# Patient Record
Sex: Male | Born: 1935 | Race: Black or African American | Hispanic: No
Health system: Southern US, Community
[De-identification: ages and names within clinical notes are randomized; demographics above are authoritative.]

## PROBLEM LIST (undated history)

## (undated) DIAGNOSIS — G20A1 Parkinson's disease without dyskinesia, without mention of fluctuations: Secondary | ICD-10-CM

## (undated) DIAGNOSIS — G20 Parkinson's disease: Secondary | ICD-10-CM

---

## 2011-02-20 ENCOUNTER — Encounter: Payer: Self-pay | Admitting: Cardiothoracic Surgery

## 2011-02-20 ENCOUNTER — Encounter: Payer: Self-pay | Admitting: Nurse Practitioner

## 2011-03-07 ENCOUNTER — Encounter: Payer: Self-pay | Admitting: Nurse Practitioner

## 2011-03-07 ENCOUNTER — Encounter: Payer: Self-pay | Admitting: Cardiothoracic Surgery

## 2011-04-06 ENCOUNTER — Encounter: Payer: Self-pay | Admitting: Nurse Practitioner

## 2011-04-06 ENCOUNTER — Encounter: Payer: Self-pay | Admitting: Cardiothoracic Surgery

## 2011-05-07 ENCOUNTER — Encounter: Payer: Self-pay | Admitting: Cardiothoracic Surgery

## 2011-05-07 ENCOUNTER — Encounter: Payer: Self-pay | Admitting: Nurse Practitioner

## 2011-05-27 ENCOUNTER — Ambulatory Visit: Payer: Self-pay | Admitting: Internal Medicine

## 2011-06-06 ENCOUNTER — Encounter: Payer: Self-pay | Admitting: Cardiothoracic Surgery

## 2011-06-06 ENCOUNTER — Encounter: Payer: Self-pay | Admitting: Nurse Practitioner

## 2011-07-07 ENCOUNTER — Encounter: Payer: Self-pay | Admitting: Nurse Practitioner

## 2011-07-07 ENCOUNTER — Encounter: Payer: Self-pay | Admitting: Cardiothoracic Surgery

## 2011-08-07 ENCOUNTER — Encounter: Payer: Self-pay | Admitting: Cardiothoracic Surgery

## 2011-08-07 ENCOUNTER — Encounter: Payer: Self-pay | Admitting: Nurse Practitioner

## 2011-09-04 ENCOUNTER — Encounter: Payer: Self-pay | Admitting: Nurse Practitioner

## 2011-09-04 ENCOUNTER — Encounter: Payer: Self-pay | Admitting: Cardiothoracic Surgery

## 2011-09-04 LAB — WOUND CULTURE

## 2011-09-28 ENCOUNTER — Ambulatory Visit: Payer: Self-pay | Admitting: Neurology

## 2011-10-05 ENCOUNTER — Encounter: Payer: Self-pay | Admitting: Cardiothoracic Surgery

## 2011-10-05 ENCOUNTER — Encounter: Payer: Self-pay | Admitting: Nurse Practitioner

## 2011-11-04 ENCOUNTER — Encounter: Payer: Self-pay | Admitting: Cardiothoracic Surgery

## 2011-11-04 ENCOUNTER — Encounter: Payer: Self-pay | Admitting: Nurse Practitioner

## 2011-12-05 ENCOUNTER — Encounter: Payer: Self-pay | Admitting: Cardiothoracic Surgery

## 2011-12-25 ENCOUNTER — Encounter: Payer: Self-pay | Admitting: Nurse Practitioner

## 2012-01-04 ENCOUNTER — Encounter: Payer: Self-pay | Admitting: Nurse Practitioner

## 2012-01-04 ENCOUNTER — Encounter: Payer: Self-pay | Admitting: Cardiothoracic Surgery

## 2012-02-04 ENCOUNTER — Encounter: Payer: Self-pay | Admitting: Cardiothoracic Surgery

## 2012-02-04 ENCOUNTER — Encounter: Payer: Self-pay | Admitting: Nurse Practitioner

## 2013-03-14 ENCOUNTER — Encounter: Payer: Self-pay | Admitting: Cardiothoracic Surgery

## 2013-04-05 ENCOUNTER — Encounter: Payer: Self-pay | Admitting: Cardiothoracic Surgery

## 2013-04-25 ENCOUNTER — Encounter: Payer: Self-pay | Admitting: Surgery

## 2013-04-30 LAB — WOUND AEROBIC CULTURE

## 2013-05-01 ENCOUNTER — Ambulatory Visit: Payer: Self-pay | Admitting: Ophthalmology

## 2013-05-01 LAB — POTASSIUM: Potassium: 4.1 mmol/L (ref 3.5–5.1)

## 2013-05-06 ENCOUNTER — Encounter: Payer: Self-pay | Admitting: Cardiothoracic Surgery

## 2013-05-06 ENCOUNTER — Encounter: Payer: Self-pay | Admitting: Surgery

## 2013-05-16 ENCOUNTER — Ambulatory Visit: Payer: Self-pay | Admitting: Ophthalmology

## 2013-05-26 ENCOUNTER — Ambulatory Visit: Payer: Self-pay | Admitting: Ophthalmology

## 2013-05-26 LAB — POTASSIUM: Potassium: 4.1 mmol/L (ref 3.5–5.1)

## 2013-06-05 ENCOUNTER — Encounter: Payer: Self-pay | Admitting: Surgery

## 2013-06-05 ENCOUNTER — Encounter: Payer: Self-pay | Admitting: Cardiothoracic Surgery

## 2013-06-06 ENCOUNTER — Ambulatory Visit: Payer: Self-pay | Admitting: Ophthalmology

## 2013-07-06 ENCOUNTER — Encounter: Payer: Self-pay | Admitting: Surgery

## 2013-07-06 ENCOUNTER — Encounter: Payer: Self-pay | Admitting: Cardiothoracic Surgery

## 2013-08-06 ENCOUNTER — Encounter: Payer: Self-pay | Admitting: Surgery

## 2013-08-06 ENCOUNTER — Encounter: Payer: Self-pay | Admitting: Cardiothoracic Surgery

## 2013-08-28 ENCOUNTER — Inpatient Hospital Stay: Payer: Self-pay | Admitting: Internal Medicine

## 2013-08-28 LAB — URINALYSIS, COMPLETE
BACTERIA: NONE SEEN
Bilirubin,UR: NEGATIVE
Blood: NEGATIVE
GLUCOSE, UR: NEGATIVE mg/dL (ref 0–75)
Ketone: NEGATIVE
LEUKOCYTE ESTERASE: NEGATIVE
NITRITE: NEGATIVE
PROTEIN: NEGATIVE
Ph: 6 (ref 4.5–8.0)
SPECIFIC GRAVITY: 1.021 (ref 1.003–1.030)
Squamous Epithelial: NONE SEEN
WBC UR: <1 /HPF (ref 0–5)

## 2013-08-28 LAB — CBC WITH DIFFERENTIAL/PLATELET
BASOS ABS: 0 10*3/uL (ref 0.0–0.1)
Basophil %: 0.2 %
Eosinophil #: 0.2 10*3/uL (ref 0.0–0.7)
Eosinophil %: 4 %
HCT: 43.7 % (ref 40.0–52.0)
HGB: 14.3 g/dL (ref 13.0–18.0)
LYMPHS PCT: 31.7 %
Lymphocyte #: 1.9 10*3/uL (ref 1.0–3.6)
MCH: 27.2 pg (ref 26.0–34.0)
MCHC: 32.7 g/dL (ref 32.0–36.0)
MCV: 83 fL (ref 80–100)
MONOS PCT: 10.5 %
Monocyte #: 0.6 x10 3/mm (ref 0.2–1.0)
NEUTROS PCT: 53.6 %
Neutrophil #: 3.2 10*3/uL (ref 1.4–6.5)
PLATELETS: 108 10*3/uL — AB (ref 150–440)
RBC: 5.25 10*6/uL (ref 4.40–5.90)
RDW: 14 % (ref 11.5–14.5)
WBC: 6.1 10*3/uL (ref 3.8–10.6)

## 2013-08-28 LAB — BASIC METABOLIC PANEL
Anion Gap: 3 — ABNORMAL LOW (ref 7–16)
BUN: 20 mg/dL — ABNORMAL HIGH (ref 7–18)
CREATININE: 0.84 mg/dL (ref 0.60–1.30)
Calcium, Total: 8.6 mg/dL (ref 8.5–10.1)
Chloride: 107 mmol/L (ref 98–107)
Co2: 30 mmol/L (ref 21–32)
EGFR (African American): >60
EGFR (Non-African Amer.): >60
GLUCOSE: 83 mg/dL (ref 65–99)
Osmolality: 281 (ref 275–301)
POTASSIUM: 3.5 mmol/L (ref 3.5–5.1)
SODIUM: 140 mmol/L (ref 136–145)

## 2013-08-28 LAB — TROPONIN I

## 2013-08-29 LAB — CBC WITH DIFFERENTIAL/PLATELET
Basophil #: 0 10*3/uL (ref 0.0–0.1)
Basophil %: 0.5 %
EOS ABS: 0.3 10*3/uL (ref 0.0–0.7)
EOS PCT: 6 %
HCT: 37.2 % — AB (ref 40.0–52.0)
HGB: 12.7 g/dL — AB (ref 13.0–18.0)
Lymphocyte #: 1.8 10*3/uL (ref 1.0–3.6)
Lymphocyte %: 37.6 %
MCH: 28.2 pg (ref 26.0–34.0)
MCHC: 34.2 g/dL (ref 32.0–36.0)
MCV: 83 fL (ref 80–100)
MONO ABS: 0.4 x10 3/mm (ref 0.2–1.0)
MONOS PCT: 8 %
Neutrophil #: 2.4 10*3/uL (ref 1.4–6.5)
Neutrophil %: 47.9 %
Platelet: 112 10*3/uL — ABNORMAL LOW (ref 150–440)
RBC: 4.5 10*6/uL (ref 4.40–5.90)
RDW: 13.8 % (ref 11.5–14.5)
WBC: 4.9 10*3/uL (ref 3.8–10.6)

## 2013-08-29 LAB — BASIC METABOLIC PANEL
Anion Gap: 6 — ABNORMAL LOW (ref 7–16)
BUN: 16 mg/dL (ref 7–18)
Calcium, Total: 8.3 mg/dL — ABNORMAL LOW (ref 8.5–10.1)
Chloride: 108 mmol/L — ABNORMAL HIGH (ref 98–107)
Co2: 29 mmol/L (ref 21–32)
Creatinine: 0.73 mg/dL (ref 0.60–1.30)
EGFR (Non-African Amer.): >60
GLUCOSE: 105 mg/dL — AB (ref 65–99)
OSMOLALITY: 287 (ref 275–301)
Potassium: 3.3 mmol/L — ABNORMAL LOW (ref 3.5–5.1)
Sodium: 143 mmol/L (ref 136–145)

## 2013-08-30 LAB — HEMOGLOBIN: HGB: 13.3 g/dL (ref 13.0–18.0)

## 2013-08-30 LAB — MAGNESIUM: MAGNESIUM: 1.9 mg/dL

## 2013-08-30 LAB — PRO B NATRIURETIC PEPTIDE: B-Type Natriuretic Peptide: 203 pg/mL (ref 0–450)

## 2013-08-30 LAB — POTASSIUM: Potassium: 4 mmol/L (ref 3.5–5.1)

## 2013-09-02 LAB — WOUND AEROBIC CULTURE

## 2013-09-03 LAB — EXPECTORATED SPUTUM ASSESSMENT W GRAM STAIN, RFLX TO RESP C

## 2013-09-14 ENCOUNTER — Ambulatory Visit: Payer: Self-pay | Admitting: Podiatrist

## 2013-11-09 ENCOUNTER — Ambulatory Visit: Payer: Self-pay | Admitting: Podiatrist

## 2014-05-10 ENCOUNTER — Emergency Department: Payer: Self-pay | Admitting: Emergency Medicine

## 2014-05-10 LAB — CBC WITH DIFFERENTIAL/PLATELET
BASOS ABS: 0.1 10*3/uL (ref 0.0–0.1)
Basophil %: 0.7 %
Eosinophil #: 0.2 10*3/uL (ref 0.0–0.7)
Eosinophil %: 1.9 %
HCT: 40.8 % (ref 40.0–52.0)
HGB: 13.4 g/dL (ref 13.0–18.0)
Lymphocyte #: 2.1 10*3/uL (ref 1.0–3.6)
Lymphocyte %: 25.6 %
MCH: 28 pg (ref 26.0–34.0)
MCHC: 32.9 g/dL (ref 32.0–36.0)
MCV: 85 fL (ref 80–100)
Monocyte #: 0.7 x10 3/mm (ref 0.2–1.0)
Monocyte %: 8.2 %
NEUTROS ABS: 5.3 10*3/uL (ref 1.4–6.5)
NEUTROS PCT: 63.6 %
Platelet: 146 10*3/uL — ABNORMAL LOW (ref 150–440)
RBC: 4.79 10*6/uL (ref 4.40–5.90)
RDW: 13.1 % (ref 11.5–14.5)
WBC: 8.3 10*3/uL (ref 3.8–10.6)

## 2014-05-10 LAB — URINALYSIS, COMPLETE
BACTERIA: NONE SEEN
BLOOD: NEGATIVE
Bilirubin,UR: NEGATIVE
GLUCOSE, UR: NEGATIVE mg/dL (ref 0–75)
Leukocyte Esterase: NEGATIVE
NITRITE: NEGATIVE
Ph: 6 (ref 4.5–8.0)
Protein: NEGATIVE
RBC, UR: NONE SEEN /HPF (ref 0–5)
SPECIFIC GRAVITY: 1.023 (ref 1.003–1.030)
Squamous Epithelial: <1
WBC UR: NONE SEEN /HPF (ref 0–5)

## 2014-05-10 LAB — BASIC METABOLIC PANEL
Anion Gap: 9 (ref 7–16)
BUN: 15 mg/dL (ref 7–18)
CALCIUM: 8.8 mg/dL (ref 8.5–10.1)
CO2: 25 mmol/L (ref 21–32)
Chloride: 108 mmol/L — ABNORMAL HIGH (ref 98–107)
Creatinine: 0.74 mg/dL (ref 0.60–1.30)
EGFR (African American): >60
EGFR (Non-African Amer.): >60
Glucose: 84 mg/dL (ref 65–99)
Osmolality: 283 (ref 275–301)
Potassium: 4.1 mmol/L (ref 3.5–5.1)
Sodium: 142 mmol/L (ref 136–145)

## 2014-05-10 LAB — TROPONIN I: Troponin-I: <0.02 ng/mL

## 2014-07-18 ENCOUNTER — Inpatient Hospital Stay: Payer: Self-pay | Admitting: Internal Medicine

## 2014-07-18 LAB — BASIC METABOLIC PANEL
Anion Gap: 7 (ref 7–16)
BUN: 17 mg/dL (ref 7–18)
CALCIUM: 9 mg/dL (ref 8.5–10.1)
Chloride: 104 mmol/L (ref 98–107)
Co2: 28 mmol/L (ref 21–32)
Creatinine: 0.72 mg/dL (ref 0.60–1.30)
Glucose: 83 mg/dL (ref 65–99)
Osmolality: 278 (ref 275–301)
Potassium: 3.7 mmol/L (ref 3.5–5.1)
SODIUM: 139 mmol/L (ref 136–145)

## 2014-07-18 LAB — URINALYSIS, COMPLETE
BILIRUBIN, UR: NEGATIVE
Bacteria: NONE SEEN
Glucose,UR: NEGATIVE mg/dL (ref 0–75)
Ketone: NEGATIVE
LEUKOCYTE ESTERASE: NEGATIVE
NITRITE: NEGATIVE
Ph: 7 (ref 4.5–8.0)
Protein: NEGATIVE
Specific Gravity: 1.008 (ref 1.003–1.030)
Squamous Epithelial: NONE SEEN
WBC UR: 1 /HPF (ref 0–5)

## 2014-07-18 LAB — CBC WITH DIFFERENTIAL/PLATELET
BASOS PCT: 0.6 %
Basophil #: 0.1 10*3/uL (ref 0.0–0.1)
Eosinophil #: 0.4 10*3/uL (ref 0.0–0.7)
Eosinophil %: 3.9 %
HCT: 47.1 % (ref 40.0–52.0)
HGB: 15.1 g/dL (ref 13.0–18.0)
Lymphocyte #: 2.2 10*3/uL (ref 1.0–3.6)
Lymphocyte %: 23.9 %
MCH: 27.4 pg (ref 26.0–34.0)
MCHC: 32.1 g/dL (ref 32.0–36.0)
MCV: 85 fL (ref 80–100)
MONO ABS: 1.1 x10 3/mm — AB (ref 0.2–1.0)
MONOS PCT: 11.9 %
Neutrophil #: 5.6 10*3/uL (ref 1.4–6.5)
Neutrophil %: 59.7 %
Platelet: 147 10*3/uL — ABNORMAL LOW (ref 150–440)
RBC: 5.52 10*6/uL (ref 4.40–5.90)
RDW: 13.7 % (ref 11.5–14.5)
WBC: 9.3 10*3/uL (ref 3.8–10.6)

## 2014-07-18 LAB — LIPID PANEL
CHOLESTEROL: 181 mg/dL (ref 0–200)
HDL Cholesterol: 51 mg/dL (ref 40–60)
LDL CHOLESTEROL, CALC: 101 mg/dL — AB (ref 0–100)
TRIGLYCERIDES: 144 mg/dL (ref 0–200)
VLDL CHOLESTEROL, CALC: 29 mg/dL (ref 5–40)

## 2014-07-18 LAB — CK: CK, TOTAL: 282 U/L (ref 39–308)

## 2014-07-18 LAB — TROPONIN I
TROPONIN-I: 0.02 ng/mL
Troponin-I: <0.02 ng/mL

## 2014-07-18 LAB — CK-MB
CK-MB: 5.7 ng/mL — ABNORMAL HIGH (ref 0.5–3.6)
CK-MB: 4.9 ng/mL — ABNORMAL HIGH (ref 0.5–3.6)

## 2014-07-19 DIAGNOSIS — I517 Cardiomegaly: Secondary | ICD-10-CM

## 2014-07-19 LAB — CBC WITH DIFFERENTIAL/PLATELET
Basophil #: 0 10*3/uL (ref 0.0–0.1)
Basophil %: 0.6 %
EOS PCT: 3.2 %
Eosinophil #: 0.2 10*3/uL (ref 0.0–0.7)
HCT: 39 % — AB (ref 40.0–52.0)
HGB: 12.7 g/dL — ABNORMAL LOW (ref 13.0–18.0)
LYMPHS ABS: 1.3 10*3/uL (ref 1.0–3.6)
Lymphocyte %: 18.3 %
MCH: 27.2 pg (ref 26.0–34.0)
MCHC: 32.6 g/dL (ref 32.0–36.0)
MCV: 84 fL (ref 80–100)
MONO ABS: 0.8 x10 3/mm (ref 0.2–1.0)
Monocyte %: 11.9 %
NEUTROS ABS: 4.7 10*3/uL (ref 1.4–6.5)
Neutrophil %: 66 %
Platelet: 147 10*3/uL — ABNORMAL LOW (ref 150–440)
RBC: 4.66 10*6/uL (ref 4.40–5.90)
RDW: 13.1 % (ref 11.5–14.5)
WBC: 7.1 10*3/uL (ref 3.8–10.6)

## 2014-07-19 LAB — MAGNESIUM: MAGNESIUM: 1.9 mg/dL

## 2014-07-19 LAB — BASIC METABOLIC PANEL
Anion Gap: 8 (ref 7–16)
BUN: 15 mg/dL (ref 7–18)
CHLORIDE: 110 mmol/L — AB (ref 98–107)
Calcium, Total: 8.4 mg/dL — ABNORMAL LOW (ref 8.5–10.1)
Co2: 28 mmol/L (ref 21–32)
Creatinine: 0.95 mg/dL (ref 0.60–1.30)
EGFR (African American): >60
EGFR (Non-African Amer.): >60
Glucose: 86 mg/dL (ref 65–99)
OSMOLALITY: 291 (ref 275–301)
POTASSIUM: 3.4 mmol/L — AB (ref 3.5–5.1)
Sodium: 146 mmol/L — ABNORMAL HIGH (ref 136–145)

## 2014-07-19 LAB — CK-MB: CK-MB: 3.1 ng/mL (ref 0.5–3.6)

## 2014-07-19 LAB — TROPONIN I: Troponin-I: 0.03 ng/mL

## 2014-07-20 LAB — BASIC METABOLIC PANEL
ANION GAP: 6 — AB (ref 7–16)
BUN: 16 mg/dL (ref 7–18)
CO2: 29 mmol/L (ref 21–32)
CREATININE: 0.88 mg/dL (ref 0.60–1.30)
Calcium, Total: 8.5 mg/dL (ref 8.5–10.1)
Chloride: 110 mmol/L — ABNORMAL HIGH (ref 98–107)
EGFR (Non-African Amer.): >60
Glucose: 87 mg/dL (ref 65–99)
OSMOLALITY: 289 (ref 275–301)
Potassium: 3.9 mmol/L (ref 3.5–5.1)
Sodium: 145 mmol/L (ref 136–145)

## 2014-10-26 NOTE — Op Note (Signed)
PATIENT NAME:  [name], [name] MR#:  [id number] DATE OF BIRTH:  01-09-1936  DATE OF PROCEDURE:  06/06/2013  PREOPERATIVE DIAGNOSIS: Visually significant cataract of the right eye.   POSTOPERATIVE DIAGNOSIS: Visually significant cataract of the right eye.   OPERATIVE PROCEDURE: Cataract extraction by phacoemulsification with implant of intraocular lens to the right eye.   SURGEON: Galen Manila, MD.   ANESTHESIA:  1. Managed anesthesia care.  2. Topical tetracaine drops followed by 2% Xylocaine jelly applied in the preoperative holding area.   COMPLICATIONS: None.   TECHNIQUE:  Stop and chop.   DESCRIPTION OF PROCEDURE: The patient was examined and consented in the preoperative holding area where the aforementioned topical anesthesia was applied to the right eye and then brought back to the Operating Room where the right eye was prepped and draped in the usual sterile ophthalmic fashion and a lid speculum was placed. A paracentesis was created with the side port blade and the anterior chamber was filled with viscoelastic. A near clear corneal incision was performed with the steel keratome. A continuous curvilinear capsulorrhexis was performed with a cystotome followed by the capsulorrhexis forceps. Hydrodissection and hydrodelineation were carried out with BSS on a blunt cannula. The lens was removed in a stop and chop technique and the remaining cortical material was removed with the irrigation-aspiration handpiece. The capsular bag was inflated with viscoelastic and the Tecnis ZCB00 19.5-diopter lens, serial number [id number] was placed in the capsular bag without complication. The remaining viscoelastic was removed from the eye with the irrigation-aspiration handpiece. The wounds were hydrated. The anterior chamber was flushed with Miostat and the eye was inflated to physiologic pressure. 0.1 mL of cefuroxime concentration 10 mg/mL was placed in the anterior chamber. The wounds were found to  be water tight. The eye was dressed with Vigamox. The patient was given protective glasses to wear throughout the day and a shield with which to sleep tonight. The patient was also given drops with which to begin a drop regimen today and will follow-up with me in one day.   ____________________________ Jerilee Field. Ilma Achee, MD wlp:gb D: 06/06/2013 21:23:57 ET T: 06/06/2013 21:31:18 ET JOB#: [id number]  cc: Daevon L. Doranne Schmutz, MD, <Dictator> Jerilee Field Dorothee Napierkowski MD ELECTRONICALLY SIGNED 06/08/2013 9:55

## 2014-10-26 NOTE — Op Note (Signed)
PATIENT NAME:  [name], [name] MR#:  [id number] DATE OF BIRTH:  [birth year]  DATE OF PROCEDURE:  05/16/2013  PREOPERATIVE DIAGNOSIS: Visually significant cataract of the left eye.   POSTOPERATIVE DIAGNOSIS: Visually significant cataract of the left eye.   OPERATIVE PROCEDURE: Cataract extraction by phacoemulsification with implant of intraocular lens to left eye.   SURGEON: Galen Manila, MD.   ANESTHESIA:  1. Managed anesthesia care.  2. Topical tetracaine drops followed by 2% Xylocaine jelly applied in the preoperative holding area.   COMPLICATIONS: None.   TECHNIQUE:  Stop and chop.  DESCRIPTION OF PROCEDURE: The patient was examined and consented in the preoperative holding area where the aforementioned topical anesthesia was applied to the left eye and then brought back to the Operating Room where the left eye was prepped and draped in the usual sterile ophthalmic fashion and a lid speculum was placed. A paracentesis was created with the side port blade and the anterior chamber was filled with viscoelastic. A near clear corneal incision was performed with the steel keratome. A continuous curvilinear capsulorrhexis was performed with a cystotome followed by the capsulorrhexis forceps. Hydrodissection and hydrodelineation were carried out with BSS on a blunt cannula. The lens was removed in a stop and chop technique and the remaining cortical material was removed with the irrigation-aspiration handpiece. The capsular bag was inflated with viscoelastic and the Tecnis ZCB00 20.0-diopter lens, serial number [id number] was placed in the capsular bag without complication. The remaining viscoelastic was removed from the eye with the irrigation-aspiration handpiece. The wounds were hydrated. The anterior chamber was flushed with Miostat and the eye was inflated to physiologic pressure. 0.1 mL of cefuroxime concentration 10 mg/mL was placed in the anterior chamber. The wounds were found to be water  tight. The eye was dressed with Vigamox. The patient was given protective glasses to wear throughout the day and a shield with which to sleep tonight. The patient was also given drops with which to begin a drop regimen today and will follow-up with me in one day.    ____________________________ Jerilee Field. Ion Gonnella, MD wlp:dmm D: 05/16/2013 16:55:59 ET T: 05/16/2013 19:51:44 ET JOB#: [id number]  cc: Gurkirat L. Krysti Hickling, MD, <Dictator> Jerilee Field Brooke Steinhilber MD ELECTRONICALLY SIGNED 05/17/2013 16:33

## 2014-10-27 NOTE — Consult Note (Signed)
Referring Physician:  Monica Becton :   Primary Care Physician:  Halina Maidens : [facility], [address] [place], [place], [place] [address], [phone]  Reason for Consult: Admit Date: 28-Aug-2013  Chief Complaint: Frequent falls  Reason for Consult: Parkinsons   History of Present Illness: History of Present Illness:    [name] is a 79 year old male with history of Parkinson's disease. He is brought to the Emergency Department by his children for frequent falls. For the last 2 weeks, the patient has been having multiple falls. The falls are especially upon standing up; however, lately, the patient has been falling down even with walking small distances. Feels like his feet are stuck to floor. No loss of consciousness. Denies having any chest pain. The patient also states that has been coughing yellowish sputum. Concerning this, the patient is brought to the Emergency Department. Workup in the Emergency Department with a chest x-ray shows borderline cardiomegaly, poor inspiration, mild right basilar atelectasis. CT head showed white matter hypodensities. Otherwise, no other obvious signs of infections are found. The patient received Rocephin and Zithromax in the Emergency Department. Denies having any fever.   Pt has tried stopping his medications with worsening of his symptoms. PD discussion: Rt. hand rest tremor started about 2008, occasionally has left hand tremor. also when he tries to write. mother had Parkinson's disease.  always been soft spoken. slower than he once was, feels stiff, balance is worse.[facility] (ordered 03/2013)09/2011: White Matter microvascular ischemic changes, prominent in occipital horn- MiraLax- progressively impaired, urinary incontinence standing bild FDI atrophy.  Past Medical History HypertensionGoutStasis ulcersVenous insufficiency  Past Surgical HistoryLaser surgery for varicose veins. History for Paget's Disease in father. Significant  for Parkinson's Disease and Alzheimer's in mother.  Social History at a Art gallery manager. Divorced. Denies history of smoking or alcohol use. No history of DUI and denies history of recreational drug use.   He has close male friend. His own kids live in [place] but he has a daughter who lives with him. No known drug allergies.   ROS:  General weakness   HEENT no complaints   Lungs cough   Cardiac no complaints   GI no complaints   GU burning with urination   Musculoskeletal no complaints   Extremities swelling   Skin pruritis   Neuro tremors   Endocrine no complaints   Psych no complaints   Home Medications: Medication Instructions Last Modified Date/Time  carbidopa-levodopa 50 mg-200 mg oral tablet, extended release 1.5 tab(s) orally 3 times a day for 2 weeks, then 2 tablets 3 times a day 23-Feb-15 20:10   Allergies:  No Known Allergies:   Vital Signs: **Vital Signs.:   25-Feb-15 14:07  Vital Signs Type Routine  Temperature Temperature (F) 97.4  Celsius 36.3  Temperature Source oral  Pulse Pulse 75  Respirations Respirations 18  Systolic BP Systolic BP 678  Diastolic BP (mmHg) Diastolic BP (mmHg) 68  Mean BP 93  Pulse Ox % Pulse Ox % 94  Pulse Ox Activity Level  At rest  Oxygen Delivery Room Air/ 21 %   EXAM: General Exam Patient looks appropriate of age, centrally obese, bald, nourished and appropriately groomed, eating dinner while I interviewed. Cardiovascular Exam: S1, S2 heart sounds present Carotid exam revealed no bruit Lung exam was clear to auscultation belly soft, protruberant  Neurological Exam      Mental Status:      Alert,     Oriented to time, place, person and  situation     Attention span and concentration seemed reduced     Memory seemed impaired (couldn't tell me daughter's name for a while)     Intact naming, repetition, comprehension.       Followed 2 step commands - no dysarthria     Fund of knowledge seemed reduced        Cranial Nerves:      Olfactory and vagus nerves not are examined      Visual fields were full      Pupils were equal, round and reactive to light and accommodation      Extra-ocular movements are sccadic      Facial sensations are normal      Face is symmetric, jaw tremors      Finger rub was heard symmetric in both ears      Palate and uvular movements are normal and oral sensations are OK      Neck muscle strength and shoulder shrug is normal      Tongue protrusion and uvular elevation are normal       Motor Exam:      Tone cogwheeling in both UE, tremors in both hand right >> left UE,       Muscle strength in all extremities is 5/5.       Deep Tendon Reflexes:      symmetric 1 +      Right Toes are down going,  Left Toes are down going            Sensory Exam:      Sensations were reduced to light touch in all extremities            Co-ordination:      Finger to nose is abnormal            Gait: significant difficulty sitting up in bed, ataxia, apraxia.  Lab Results:  Routine Micro:  24-Feb-15 22:16   Micro Text Report WOUND AEROBIC CULTURE   GRAM STAIN                FEW WHITE BLOOD CELLS   GRAM STAIN                NO ORGANISMS SEEN   ANTIBIOTIC                       Gram Stain 1 FEW WHITE BLOOD CELLS  Gram Stain 2 NO ORGANISMS SEEN  Result(s) reported on 30 Aug 2013 at 12:32PM.  Routine Chem:  24-Feb-15 04:59   Glucose, Serum  105  BUN 16  Creatinine (comp) 0.73  Sodium, Serum 143  Chloride, Serum  108  CO2, Serum 29  Calcium (Total), Serum  8.3  Anion Gap  6  Osmolality (calc) 287  eGFR (African American) >60  eGFR (Non-African American) >60 (eGFR values <37m/min/1.73 m2 may be an indication of chronic kidney disease (CKD). Calculated eGFR is useful in patients with stable renal function. The eGFR calculation will not be reliable in acutely ill patients when serum creatinine is changing rapidly. It is not useful in  patients on dialysis. The eGFR  calculation may not be applicable to patients at the low and high extremes of body sizes, pregnant women, and vegetarians.)  25-Feb-15 05:41   Potassium, Serum 4.0 (Result(s) reported on 30 Aug 2013 at 06:37AM.)  Magnesium, Serum 1.9 (1.8-2.4 THERAPEUTIC RANGE: 4-7 mg/dL TOXIC: > 10 mg/dL  -----------------------)  Cardiac:  23-Feb-15 14:39  Troponin I < 0.02 (0.00-0.05 0.05 ng/mL or less: NEGATIVE  Repeat testing in 3-6 hrs  if clinically indicated. >0.05 ng/mL: POTENTIAL  MYOCARDIAL INJURY. Repeat  testing in 3-6 hrs if  clinically indicated. NOTE: An increase or decrease  of 30% or more on serial  testing suggests a  clinically important change)  Routine UA:  23-Feb-15 18:36   Color (UA) Yellow  Clarity (UA) Clear  Glucose (UA) Negative  Bilirubin (UA) Negative  Ketones (UA) Negative  Specific Gravity (UA) 1.021  Blood (UA) Negative  pH (UA) 6.0  Protein (UA) Negative  Nitrite (UA) Negative  Leukocyte Esterase (UA) Negative (Result(s) reported on 28 Aug 2013 at 07:16PM.)  RBC (UA) 2 /HPF  WBC (UA) <1 /HPF  Bacteria (UA) NONE SEEN  Epithelial Cells (UA) NONE SEEN  Mucous (UA) PRESENT (Result(s) reported on 28 Aug 2013 at 07:16PM.)  Routine Hem:  24-Feb-15 04:59   WBC (CBC) 4.9  RBC (CBC) 4.50  Hematocrit (CBC)  37.2  Platelet Count (CBC)  112  MCV 83  MCH 28.2  MCHC 34.2  RDW 13.8  Neutrophil % 47.9  Lymphocyte % 37.6  Monocyte % 8.0  Eosinophil % 6.0  Basophil % 0.5  Neutrophil # 2.4  Lymphocyte # 1.8  Monocyte # 0.4  Eosinophil # 0.3  Basophil # 0.0 (Result(s) reported on 29 Aug 2013 at 05:28AM.)  25-Feb-15 05:41   Hemoglobin (CBC) 13.3 (Result(s) reported on 30 Aug 2013 at 06:30AM.)   Radiology Impression: Radiology Impression: 1) extensive WM microvascular ischemic changes (less likely to be ventriculomegaly related increased periventricular pressure related WM hypodensity)  2) Cortical atrophy with compensatory ventriculomegaly.    Impression/Recommendations: Recommendations:   1) progressive parkinson's disease - pt not interested in increasing dose of carbidopa/levodopa for now. (he actually wanted to cut it down) - had prolonged discussion with pt. and his kids. Per kids when he cut medicine - his symptoms got much worse. over time he has developed significant freezing of gait and wosening of his shuffling. (? some features of magnetic gait ? NPH with his ventriculomegaly but cognition is not as impaired, urinary incontinence came later in the picture - won't subject him to LP to look for high volume tap related improved gait for now) Gait impairment (sudden worsening in last 2 wks or so) - with extensive WM changes and usual then elevated BP - ? infract  causing lower half parkinsonism (vascular parkinsonism)ordered MRI brainordered ASA 81 mg po dayhe has long standing uncontrolled HTN due to medication non compliance mild toxic metabolic encephalopathy - ? PNA,  Venous stasis related LE ulcers. aggressive PT/OT/ST. Pt can be made familiar with [facility]/LOUD if possible (he had declined it in past as out pt due to time restrains)might benefit from rehab placement on discharge.will follow.  Electronic Signatures: Ray Church (MD)  (Signed 25-Feb-15 22:20)  Authored: REFERRING PHYSICIAN, Primary Care Physician, Consult, History of Present Illness, Review of Systems, PAST MEDICAL/SURGICAL HISTORY, HOME MEDICATIONS, ALLERGIES, NURSING VITAL SIGNS, Physical Exam-, LAB RESULTS, RADIOLOGY RESULTS, Recommendations   Last Updated: 25-Feb-15 22:20 by Ray Church (MD)

## 2014-10-27 NOTE — Discharge Summary (Signed)
PATIENT NAME:  [name], [name] MR#:  [id number] DATE OF BIRTH:  11-Sep-1935  DATE OF ADMISSION:  08/28/2013 DATE OF DISCHARGE:  09/01/2013   ADMITTING DIAGNOSIS: Right lower lobe pneumonia, frequent falls due to debility.   DISCHARGE DIAGNOSES: 1. Malignant hypertension.  2. Suspected bacterial pneumonia in the right lower lobe.  3. Right leg venous stasis ulcers with Proteus mirabilis colonization.  4. Anemia.  5. Parkinson's disease.  6. Constipation.  7. Generalized weakness.   DISCHARGE CONDITION: Stable.   DISCHARGE MEDICATIONS:  1. The patient is to continue carbidopa/levodopa new dose 50/200 two tablets 3 times daily 1 hour before meals.  2. Acetaminophen 325 mg 2 tablets every 4 hours as needed.  3. Lisinopril 40 mg twice daily. 4. Amlodipine 10 mg p.o. day.  5. Senna 1 tablet once daily as needed.  6. Docusate sodium 100 mg p.o. twice daily.  7. Levofloxacin 750 mg p.o. once daily for the next 5 days.  8. Albuterol/ipratropium 2.5/0.5 mg in 3 mL inhalation solution 1 inhalation 6 times daily as needed.  9. Aspirin 81 mg p.o. daily.   HOME OXYGEN: None.   DIET: 2 gram salt, low-fat, low-cholesterol, regular consistency.   ACTIVITY LIMITATIONS: As tolerated.   REFERRALS: To physical therapy, occupational therapy.    FOLLOWUP APPOINTMENT: With Dr. Judithann Graves in 2 days after discharge.    CONSULTANTS: Case management, social work, Dr. Sherryll Burger in neurology, wound care, Ms. Maple Hudson.   RADIOLOGIC STUDIES:  Chest x-ray, portable, single view on 08/28/2013 revealed borderline cardiomegaly, poor inspiration, mild right basilar atelectasis. No segmental infiltrate.  CT scan of head without contrast on 08/28/2013 showed no acute intracranial findings, extensive white matter hypodensities, likely represent small vessel ischemia, marked atrophy and proportional ventricular dilatation.  MRI of brain without contrast on 08/31/2013 showed generalized atrophy with diffuse white  matter hyperintensity, most consistent with chronic microvascular ischemia. No acute infarct.   HOSPITAL COURSE: The patient is a 79 year old African-American male with history of Parkinson's disease, who presented to the hospital with complaints of frequent falls. Please refer to Dr. Clarita Leber admission note on 08/28/2013. On arrival to the hospital, the patient denied any loss of consciousness. No chest pains; however, he was expectorating yellow sputum. His chest x-ray was concerning for possible atelectasis versus pneumonia. His vital signs: Temperature was 97.9, pulse was 64, respiration rate was 16, blood pressure was 219/104, saturation was 96% on 2 liters of oxygen through nasal cannula. Physical exam was unremarkable. The patient did have right lower extremity ulcerations around the medial malleolus area. On chest auscultation, he was found to have somewhat decreased breath sounds in lower lobes. His lab data done on admission showed normal BMP except for mild elevation of BUN to 20, otherwise BMP was normal. Troponin was less than 0.02. CBC was within normal limits; however, the patient's platelet count was low at 108. Absolute neutrophil count was normal at 3.2. Urinalysis was unremarkable. EKG showed atrial flutter at 65 beats per minute, 5:1 AV conduction, nonspecific ST-T changes. The patient's chest x-ray, as mentioned above, was concerning for possible pneumonia. The patient was admitted to the hospital for further evaluation.   He was started on antibiotic therapy, and consultation with neurologist, Dr. Sherryll Burger, was obtained. Dr. Sherryll Burger saw the patient in consultation, and he adjusted the patient's Parkinson's medications. Recommended to get an MRI of brain done to rule out stroke. He also recommended to control blood pressure closely, also continue aspirin therapy at 81 mg p.o. daily dose  due to microvascular ischemic changes seen on CT scan as well as MRI. He felt that the patient's gait  impairment with extensive white matter changes and unusually elevated blood pressure could be due to vascular parkinsonism as well as Parkinson's disease itself. He recommended to continue aggressive physical therapy, occupational therapy as well as speech therapy and to get rehabilitation placement. The patient had MRI done, which did not show any acute stroke; however, extensive microvascular changes.  While in the hospital, the patient was also noted to have significantly elevated blood pressure. His blood pressure medications were advanced to current levels. His blood pressure is much better controlled today, on 09/01/2013. On the day of discharge, 09/01/2013, the patient's temperature is 97.8, pulse is fluctuating between 57 to 67, respiration rate was 18, blood pressure 110/65, saturation 96% on room air at rest.   In regards to pneumonia, the patient is to continue antibiotic therapy for 5 more days to complete course. We chose this antibiotic since the patient did have leg ulcerations, venous stasis with Proteus mirabilis which was felt to be colonization; however, still antibiotic such as Levaquin would work for eradicating this colonization.   In regards to anemia, the patient was noted to be slightly anemic during rehydration. Hemoglobin level drifted down to 12.7 on 08/29/2013; however, when we stopped the patient's IV fluids, the patient's hemoglobin level normalized to 13.3.   In regards to generalized weakness, he was evaluated by physical therapist, and rehabilitation placement was recommended.   In regards to right leg venous stasis ulcerations, wound care, as mentioned above, saw the patient and recommended specialized dressing which was: Every day, the patient's right leg is supposed to be cleaned with soap and water, pat gently to dry, apply Cadexomer iodine gel and gauze to wound bed and then top with dry 4 x 4 and calcium alginate for absorption, wrap with zinc oxide/calamine layer  and secure with Coban. Change 3 times weekly, Mondays, Wednesdays, Fridays, and follow up with [facility] as outpatient.   In regards to speech evaluation, the patient is to continue low-salt diet. The patient's medications should be given whole in puree if any trouble swallowing with liquids. The patient's tray has to be set up at meals. The patient must sit fully upright for any oral intake.   The patient is being discharged in stable condition with the above-mentioned medications and followup.   TIME SPENT: 40 minutes.   ____________________________ Katharina Caper, MD rv:lb D: 09/01/2013 12:46:14 ET T: 09/01/2013 13:33:24 ET JOB#: [id number]  cc: Katharina Caper, MD, <Dictator> Bari Edward, MD Breshae Belcher MD ELECTRONICALLY SIGNED 09/07/2013 18:53

## 2014-10-27 NOTE — H&P (Signed)
PATIENT NAME:  [name], [name] MR#:  [id number] DATE OF BIRTH:  Oct 30, 1935  DATE OF ADMISSION:  08/28/2013  PRIMARY CARE PHYSICIAN: Dr. Bari Edward.   REFERRING PHYSICIAN: Dr. Janalyn Harder.   CHIEF COMPLAINT: Frequent falls.   HISTORY OF PRESENT ILLNESS: [name] is a 79 year old male with history of Parkinson's disease. He is brought to the Emergency Department by his children for frequent falls. For the last 2 weeks, the patient has been having multiple falls. The falls are especially upon standing up; however, lately, the patient has been falling down even with walking small distances. No loss of consciousness. Denies having any chest pain. The patient also states that has been coughing yellowish sputum. Concerning this, the patient is brought to the Emergency Department. Workup in the Emergency Department with a chest x-ray shows borderline cardiomegaly, poor inspiration, mild right basilar atelectasis. CT head showed white matter hypodensities. Otherwise, no other obvious signs of infections are found. The patient received Rocephin and Zithromax in the Emergency Department. Denies having any fever.   PAST MEDICAL HISTORY: Parkinson's disease.   PAST SURGICAL HISTORY: None.   ALLERGIES: No known drug allergies.   HOME MEDICATIONS: Carbidopa/levodopa 50/200 mg 1-1/2 tablets 3 times a day.   SOCIAL HISTORY: Quit smoking in 1990. Denies drinking alcohol or using illicit drugs. Currently lives with his daughter.   FAMILY HISTORY: Hypertension.   REVIEW OF SYSTEMS:  CONSTITUTIONAL: Generalized weakness.  EYES: No change in vision.  ENT: No change in hearing.  RESPIRATORY: Has cough, shortness of breath.  CARDIOVASCULAR: No chest pain, palpitations.  GASTROINTESTINAL: No nausea, vomiting, abdominal pain. Usually constipated.  GENITOURINARY: Has urinary incontinence.  ENDOCRINE: No polyuria or polydipsia.  HEMATOLOGIC: No easy bruising or bleeding.  SKIN: No rash or lesions.   MUSCULOSKELETAL: No joint pains .  NEUROLOGIC: The patient has Parkinson's disease.   PHYSICAL EXAMINATION:  GENERAL: This is a well-built, well-nourished, age-appropriate male lying down in the bed, not in distress.  VITAL SIGNS: Temperature 97.9, pulse 64, blood pressure 219/104, respiratory rate of 16, oxygen saturation is 96% on 2 liters of oxygen.  HEENT: Head normocephalic, atraumatic. There is no scleral icterus. Conjunctivae normal. Pupils equal and react to light. Extraocular movements are intact. Mucous membranes dry. No pharyngeal erythema.  NECK: Supple. No lymphadenopathy. No JVD. No carotid bruit. No thyromegaly.  CHEST: Has no focal tenderness. Somewhat decreased breath sounds in the lower lobes.  HEART: S1 and S2 regular. No murmurs are heard. No pedal edema both legs. Right lower extremity has an ulcer which has been healing under the care of the wound care center.  ABDOMEN: Obese. Bowel sounds present. Soft, nontender, nondistended.  SKIN: As mentioned above, has an ulcer on the right heel.  MUSCULOSKELETAL: Good range of motion in all of the extremities.  NEUROLOGIC: The patient is alert, oriented to place, person and time. Cranial nerves II through XII intact. Motor 5/5 in upper and lower extremities.   LABORATORIES: Chest x-ray as mentioned above, right lower lobe atelectasis.   CT head without contrast: Chronic microvascular changes.   UA negative for nitrites and leukocyte esterase. CBC and CMP are completely within normal limits.   ASSESSMENT AND PLAN: [name] is a 79 year old male who comes to the Emergency Department with cough, shortness of breath and frequent falls.  1. Right lower lobe pneumonia: Will treat it as a community-acquired pneumonia with Rocephin and Zithromax.  2. Frequent falls secondary to debility: Will involve physical therapy, occupational therapy. Will obtain  orthostatic . The patient does not have any loss of consciousness.  3. Parkinson's  disease: Continue with carbidopa/levodopa.  4. Keep the patient on deep vein thrombosis prophylaxis with Lovenox.   TIME SPENT: 45 minutes.   ____________________________ Susa Griffins, MD pv:gb D: 08/28/2013 21:31:17 ET T: 08/28/2013 21:45:23 ET JOB#: [id number]  cc: Susa Griffins, MD, <Dictator> Bari Edward, MD Susa Griffins MD ELECTRONICALLY SIGNED 09/10/2013 22:37

## 2014-11-04 NOTE — Consult Note (Signed)
CHIEF COMPLAINT and HISTORY:  Subjective/Chief Complaint weakness, falls   History of Present Illness Patient is a 79 yo AAM with Parkinsons disease who is wheelchair bound and has significant tremors is admitted with falls and confusion.  He has been living at home, but this is becoming more difficult.  He had right sided weakness that was transient, prompting evaluation. He is near his baseline now.  He is a fair historian but not great.  He had an MRI which shows no acute stroke.  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   PAST MEDICAL/SURGICAL HISTORY:  Past Medical History:   overactive bladder:    Parkinson's Dis.:   ALLERGIES:  Allergies:  No Known Allergies:   HOME MEDICATIONS:  Home Medications: Medication Instructions Status  carbidopa-levodopa 50 mg-200 mg oral tablet, extended release 1 tab(s) orally 3 times a day Active  furosemide 20 mg oral tablet 1 tab(s) orally once a day, As Needed Active  Ditropan XL 10 mg/24 hr oral tablet, extended release 1 tab(s) orally once a day Active   Family and Social History:  Family History Coronary Artery Disease  Hypertension  Parkinsons   Social History negative tobacco, negative ETOH, negative Illicit drugs   Place of Living Home   Review of Systems:  Subjective/Chief Complaint Neuro symptoms per HPI No heat or cold intolerance Overactive bladder No blurry or double vision No tinnitus or ear pain No rashes or ulcer No suicidal ideation or psychosis No signs of bleeding or easy bruising No SOB/DOE, orthopnea, or sputum No palpitations or chest pain No N/V/D or abdominal pain No joint pain or joint swelling No fever or chills No unintentional weight loss or gain   Medications/Allergies Reviewed Medications/Allergies reviewed   Physical Exam:  GEN well developed, well nourished, obese   HEENT pink conjunctivae, moist oral mucosa   NECK No masses   trachea midline   RESP normal resp effort  no use of accessory muscles   CARD regular rate  no JVD   VASCULAR ACCESS none   ABD denies tenderness  soft  normal BS   GU no superpubic tenderness   LYMPH negative neck, negative axillae   EXTR negative cyanosis/clubbing, negative edema   SKIN normal to palpation, skin turgor good   NEURO cranial nerves intact, follows commands, tremor at a baseline, gait not assessed as is in bed   PSYCH alert, A+O to time, place, person, fair insight   LABS:  Laboratory Results: LabObservation:    14-Jan-16 08:49, Echo Doppler  OBSERVATION   Reason for Test  Cardiology:  Echo Doppler   REASON FOR EXAM:      COMMENTS:       PROCEDURE: [facility] - ECHO DOPPLER COMPLETE(TRANSTHOR)  - Jul 19 2014  8:49AM     RESULT: Echocardiogram Report    Patient Name:   [name] Date of Exam: 07/19/2014  Medical Rec #:  [id number]           Custom1:  Date of Birth:  1935-12-04        Height:       73.0 in  Patient Age:    8 years         Weight:       254.0 lb  Patient Gender: M                BSA:  2.29 m??    Indications: TIA  Sonographer:    Sherrie Sport RDCS  Referring Phys: Harvest Dark, A    Sonographer Comments: Technically difficult study due to poor echo   windows and The only view obtainable was apical.    Summary:   1. Left ventricular ejection fraction, by visual estimation, is 55 to   60%.   2. Normal global left ventricular systolic function.   3. Impaired relaxation pattern of LV diastolic filling.   4. Mild concentric left ventricular hypertrophy.   5. Mild LVOT gradient.   6. Mildly dilated left atrium.   7. No cardiac source of embolism is identified.  2D AND M-MODE MEASUREMENTS (normal ranges within parentheses):  Left Ventricle:          Normal  IVSd (2D):      1.10 cm (0.7-1.1)  LVPWd (2D):     1.27 cm (0.7-1.1) Aorta/LA:                  Normal  LVIDd (2D):     4.04 cm (3.4-5.7) Aortic Root (2D): 3.00  cm(2.4-3.7)  LVIDs (2D):     2.62 cm           Left Atrium (2D): 3.20 cm (1.9-4.0)  LV FS (2D):     35.1 %   (>25%)  LV EF (2D):     65.0 %   (>50%)                                    Right Ventricle:                                    RVd (2D):  LV DIASTOLIC FUNCTION:  MV Peak E: 0.59 m/s E/e' Ratio: 7.60  MV Peak A: 0.92 m/s Decel Time: 274 msec  E/A Ratio: 0.64  SPECTRAL DOPPLER ANALYSIS (where applicable):  Mitral Valve:  MV P1/2 Time: 79.46 msec  MV Area, PHT: 2.77 cm??  Aortic Valve: AoV Max Vel: 1.69 m/s AoV Peak PG: 11.5 mmHg AoV Mean PG:  LVOT Vmax: 1.50 m/s LVOT VTI:  LVOT Diameter: 2.00 cm  AoV Area, Vmax: 2.79 cm?? AoV Area, VTI:  AoV Area, Vmn:  Tricuspid Valve and PA/RV Systolic Pressure: TR Max Velocity: 2.22 m/s RA   Pressure: 5 mmHg RVSP/PASP: 24.6 mmHg    PHYSICIAN INTERPRETATION:  Left Ventricle: The left ventricular internal cavity size was normal.   Mild concentric left ventricular hypertrophy. Global LV systolic function   was normal. Left ventricular ejection fraction, by visual estimation, is   55 to 60%. Spectral Doppler shows impaired relaxation pattern of LV   diastolic filling.  Right Ventricle: Normal right ventricular size, wall thickness, and     systolic function.  Left Atrium: The left atrium is mildly dilated.  Right Atrium: The right atrium is normal in size and structure.  Pericardium: There is no evidence of pericardial effusion.  Mitral Valve: The mitral valve is normal in structure. No evidence of   mitral valve stenosis. Trace mitral valve regurgitation is seen.  Tricuspid Valve: The tricuspid valve is normal. Trivial tricuspid   regurgitation is visualized. The tricuspid regurgitant velocity is 2.22   m/s, and with an assumed right atrial pressure of 5 mmHg, the estimated   right ventricular systolic pressure is normal at 24.6 mmHg.  Aortic Valve: The aortic valve is trileaflet and structurally normal,   with normal leaflet excursion;  without any evidence of aortic stenosis or   insufficiency.  Pulmonic Valve: The pulmonic valve is not well seen.  Aorta: The aorta is not well seen.  Venous: The inferior vena cava was not well visualized.    [id number] Kathlyn Sacramento MD  Electronically signed by [id number] Kathlyn Sacramento MD  Signature Date/Time: 07/19/2014/11:05:39 AM    *** Final ***    IMPRESSION: .        Verified By: Mertie Clause. Fletcher Anon, M.D., MD  Routine Chem:    13-Jan-16 07:61, Basic Metabolic Panel (w/Total Calcium)  Glucose, Serum 83  BUN 17  Creatinine (comp) 0.72  Sodium, Serum 139  Potassium, Serum 3.7  Chloride, Serum 104  CO2, Serum 28  Calcium (Total), Serum 9.0  Anion Gap 7  Osmolality (calc) 278  eGFR (African American) >60  eGFR (Non-African American) >60  eGFR values <53m/min/1.73 m2 may be an indication of chronic  kidney disease (CKD).  Calculated eGFR, using the MRDR Study equation, is useful in   patients with stable renal function.  The eGFR calculation will not be reliable in acutely ill patients  when serum creatinine is changing rapidly. It is not useful in  patients on dialysis. The eGFR calculation may not be applicable  to patients at the low and high extremes of body sizes, pregnant  women, and vegetarians.    13-Jan-16 15:04, Lipid Profile ([place]  Cholesterol, Serum 181  Triglycerides, Serum 144  HDL (INHOUSE) 51  VLDL Cholesterol Calculated 29  LDL Cholesterol Calculated 101  Result(s) reported on 18 Jul 2014 at 0[facility]    14-Jan-16 051:83 Basic Metabolic Panel (w/Total Calcium)  Glucose, Serum 86  BUN 15  Creatinine (comp) 0.95  Sodium, Serum 146  Potassium, Serum 3.4  Chloride, Serum 110  CO2, Serum 28  Calcium (Total), Serum 8.4  Anion Gap 8  Osmolality (calc) 291  eGFR (African American) >60  eGFR (Non-African American) >60  eGFR values <640mmin/1.73 m2 may be an indication of chronic  kidney disease (CKD).  Calculated eGFR, using the MRDR Study equation, is  useful in   patients with stable renal function.  The eGFR calculation will not be reliable in acutely ill patients  when serum creatinine is changing rapidly. It is not useful in  patients on dialysis. The eGFR calculation may not be applicable  to patients at the low and high extremes of body sizes, pregnant  women, and vegetarians.    14-Jan-16 04:29, Magnesium, Serum  Magnesium, Serum 1.9  1.8-2.4  THERAPEUTIC RANGE: 4-7 mg/dL  TOXIC: > 10 mg/dL   -----------------------  Cardiac:    13-Jan-16 11:41, CK, Total  CK, Total 282  Result(s) reported [facility] 18 Jul 2014 at [facility]   13-Jan-16 15:04, CPK-MB, Serum  CPK-MB, Serum 5.7  Result(s) reported on 18 Jul 2014 at 06:54PM.    13-Jan-16 15:04, Troponin I  Troponin I < 0.02  0.00-0.05  0.05 ng/mL or less: NEGATIVE   Repeat testing in 3-6 hrs   if clinically indicated.  >0.05 ng/mL: POTENTIAL   MYOCARDIAL INJURY. Repeat   testing in 3-6 hrs if   clinically indicated.  NOTE: An increase or decrease   of 30% or more on serial   testing suggests a   clinically important change    13-Jan-16 20:43, CPK-MB, Serum  CPK-MB, Serum 4.9  Result(s) reported on 18 Jul 2014 at 09:20PM.  13-Jan-16 20:43, Troponin I  Troponin I 0.02  0.00-0.05  0.05 ng/mL or less: NEGATIVE   Repeat testing in 3-6 hrs   if clinically indicated.  >0.05 ng/mL: POTENTIAL   MYOCARDIAL INJURY. Repeat   testing in 3-6 hrs if   clinically indicated.  NOTE: An increase or decrease   of 30% or more on serial   testing suggests a   clinically important change    13-Jan-16 23:45, CPK-MB, Serum  CPK-MB, Serum 3.1  Result(s) reported on 19 Jul 2014 at 01:19AM.    13-Jan-16 23:45, Troponin I  Troponin I 0.03  0.00-0.05  0.05 ng/mL or less: NEGATIVE   Repeat testing in 3-6 hrs   if clinically indicated.  >0.05 ng/mL: POTENTIAL   MYOCARDIAL INJURY. Repeat   testing in 3-6 hrs if   clinically indicated.  NOTE: An increase or decrease   of 30% or more on  serial   testing suggests a   clinically important change  Routine UA:    13-Jan-16 16:32, Urinalysis  Color (UA) Straw  Clarity (UA) Clear  Glucose (UA) Negative  Bilirubin (UA) Negative  Ketones (UA) Negative  Specific Gravity (UA) 1.008  Blood (UA) 1+  pH (UA) 7.0  Protein (UA) Negative  Nitrite (UA) Negative  Leukocyte Esterase (UA) Negative  Result(s) reported on 18 Jul 2014 at 05:11PM.  RBC (UA) 3 /HPF  WBC (UA) 1 /HPF  Bacteria (UA)   NONE SEEN  Epithelial Cells (UA)   NONE SEEN   Result(s) reported on 18 Jul 2014 at 05:11PM.  Routine Hem:    13-Jan-16 15:04, CBC Profile  WBC (CBC) 9.3  RBC (CBC) 5.52  Hemoglobin (CBC) 15.1  Hematocrit (CBC) 47.1  Platelet Count (CBC) 147  MCV 85  MCH 27.4  MCHC 32.1  RDW 13.7  Neutrophil % 59.7  Lymphocyte % 23.9  Monocyte % 11.9  Eosinophil % 3.9  Basophil % 0.6  Neutrophil # 5.6  Lymphocyte # 2.2  Monocyte # 1.1  Eosinophil # 0.4  Basophil # 0.1  Result(s) reported on 18 Jul 2014 at 03:54PM.    14-Jan-16 04:29, CBC Profile  WBC (CBC) 7.1  RBC (CBC) 4.66  Hemoglobin (CBC) 12.7  Hematocrit (CBC) 39.0  Platelet Count (CBC) 147  MCV 84  MCH 27.2  MCHC 32.6  RDW 13.1  Neutrophil % 66.0  Lymphocyte % 18.3  Monocyte % 11.9  Eosinophil % 3.2  Basophil % 0.6  Neutrophil # 4.7  Lymphocyte # 1.3  Monocyte # 0.8  Eosinophil # 0.2  Basophil # 0.0  Result(s) reported on 19 Jul 2014 at 05:20AM.   RADIOLOGY:  Radiology Results: XRay:    23-Feb-15 19:19, Chest Portable Single View  Chest Portable Single View  REASON FOR EXAM:    weakness, falls  COMMENTS:       PROCEDURE: DXR - DXR PORTABLE CHEST SINGLE VIEW  - Aug 28 2013  7:19PM     CLINICAL DATA:  Weakness, fall    EXAM:  PORTABLE CHEST - 1 VIEW    COMPARISON:  None.    FINDINGS:  Cardiomediastinal silhouette scattered borderline cardiomegaly.  Study is limited by poor inspiration. Mild right basilar  atelectasis. No segmental infiltrate or pulmonary  edema.     IMPRESSION:  Borderline cardiomegaly. Poor inspiration. Mild right basilar  atelectasis.No segmental infiltrate.      Electronically Signed    By: Lahoma Crocker M.D.    On: 08/28/2013 19:29  Verified By: Ephraim Hamburger, M.D.,    [phone] 19:06, Chest Portable Single View  Chest Portable Single View  REASON FOR EXAM:    weakness  COMMENTS:       PROCEDURE: DXR - DXR PORTABLE CHEST SINGLE VIEW  - May 10 2014  7:06PM     CLINICAL DATA:  Weakness and not acting right.    EXAM:  PORTABLE CHEST - 1 VIEW    COMPARISON:  08/28/2013    FINDINGS:  Single view of the chest demonstrates clear lungs. Heart and  mediastinum are within normal limits. Negative for a pneumothorax.   IMPRESSION:  No acute chest abnormality.      Electronically Signed    By: Markus Daft M.D.    On: 05/10/2014 19:14         Verified By: Burman Riis, M.D.,  [place]:    14-Jan-16 10:57, US Carotid Doppler Bilateral  US Carotid Doppler Bilateral  REASON FOR EXAM:    TIA  COMMENTS:       PROCEDURE: [place]  - US CAROTID DOPPLER BILATERAL  - Jul 19 2014 10:57AM     CLINICAL DATA:  Transient ischemic attack.    EXAM:  BILATERAL CAROTID DUPLEX ULTRASOUND    TECHNIQUE:  Pearline Cables scale imaging, color Doppler and duplex ultrasound were  performed of bilateral carotid and vertebral arteries in the neck.    COMPARISON:  None.  FINDINGS:  Criteria: Quantification of carotid stenosis is based on velocity  parameters that correlate the residual internal carotid diameter  with NASCET-based stenosis levels, using the diameter of the distal  internal carotid lumen as the denominator for stenosis measurement.    The following velocity measurements were obtained:    RIGHT    ICA:  Occluded.    CCA:  809/98 cm/sec    SYSTOLIC ICA/CCA RATIO:  ICA occluded.  DIASTOLIC ICA/CCA RATIO:  ICA occluded.    ECA:  77 cm/sec    LEFT    ICA:  125/19 cm/sec    CCA:  338/25 cm/sec    SYSTOLIC ICA/CCA  RATIO:  0.9    DIASTOLIC ICA/CCA RATIO:  1.3    ECA:  159 cm/sec  RIGHTCAROTID ARTERY: Complete occlusion of the proximal and more  distal right internal carotid artery is noted.    RIGHT VERTEBRAL ARTERY:  Antegrade flow is noted.    LEFT CAROTID ARTERY:  No significant plaque or stenosis is noted.    LEFT VERTEBRAL ARTERY:  Antegrade flow is noted.     IMPRESSION:  Complete occlusion of the right internal carotid artery is noted.    No hemodynamically significant stenosis or plaque is noted in the  left cervical carotid arteries.  Electronically Signed    By: Sabino Dick M.D.    On: 07/19/2014 11:58         Verified By: Marveen Reeks, M.D.,  [place]:    23-Feb-15 19:19, Chest Portable Single View  PACS Image    23-Feb-15 20:08, CT Head Without Contrast  PACS Image    26-Feb-15 09:05, MRI Brain Without Contrast  PACS Image    05-Nov-15 19:06, Chest Portable Single View  PACS Image    13-Jan-16 14:49, CT Head Without Contrast  PACS Image    14-Jan-16 10:14, MRI Brain Without Contrast  PACS Image    14-Jan-16 10:57, US Carotid Doppler Bilateral  PACS Image  MRI:    26-Feb-15 09:05, MRI Brain Without Contrast  MRI Brain Without Contrast  REASON  FOR EXAM:    gait impairment, ataxia, extensive WM changes on CT   head, concern for stroke.  COMMENTS:       PROCEDURE: MR  - MR BRAIN WO CONTRAST  - Aug 31 2013  9:05AM     CLINICAL DATA:  Gait impairment. Ataxia. Parkinson's. Rule out  stroke.    EXAM:  MRI HEAD WITHOUT CONTRAST    TECHNIQUE:  Multiplanar, multiecho pulse sequences of the brain and surrounding  structures were obtained without intravenous contrast.  COMPARISON:  CT 08/28/2013    FINDINGS:  Negative for acute infarct.    Generalized atrophy. Chronic ischemic changes throughout the white  matter. No cortical infarct. Slight hyperintensity in the pons  likely due to chronic ischemia.    Negative for hemorrhage or mass.    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 Mild mucosal edema in the paranasal sinuses.     IMPRESSION:  Generalized atrophy with diffuse white matter hyperintensity most  consistent with chronic microvascular ischemia. No acute infarct.      Electronically Signed    By: Franchot Gallo M.D.    On: 08/31/2013 09:17         Verified By: Truett Perna, M.D.,    14-Jan-16 10:14, MRI Brain Without Contrast  MRI Brain Without Contrast  REASON FOR EXAM:    TIA, right sided weakness  COMMENTS:       PROCEDURE: MR  - MR BRAIN WO CONTRAST  - Jul 19 2014 10:14AM     CLINICAL DATA:  79 year old male with right side weakness. TIA.  History of bilateral carotid surgery. Initial encounter.    EXAM:  MRI HEAD WITHOUT CONTRAST    TECHNIQUE:  Multiplanar, multiecho pulse sequences of the brain and surrounding  structures were obtained without intravenous contrast.  COMPARISON:  Head CT without contrast 07/18/2014. Brain MRI  08/31/2013 andearlier.    FINDINGS:  Cerebral volume is stable since 2015. Major intracranial vascular  flow voids are stable. No restricted diffusion to suggest acute  infarction. No midline shift, mass effect, evidence of mass lesion,  ventriculomegaly, extra-axial collection or acute intracranial  hemorrhage. Cervicomedullary junction and pituitary are within  normal limits. Stable visualized cervical spine. Normal bone marrow  signal.    Confluent cerebral white matter T2 and FLAIR hyperintensity persists  and is nonspecific. Deep gray matter nuclei remain within normal  limits. Minimal to mild T2 heterogeneity in the brainstem.  Suggestion of a tiny chronic lacunar infarct in the right cerebellum  which is new from prior studies (series 9, image 8).    Visible internal auditory structures appear normal. Stable paranasal  sinuses and mastoids. Stable orbits soft tissues. 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  Visualized scalp  soft tissues are within normal limits.     IMPRESSION:  1.  No acute intracranial abnormality.  2. Chronic nonspecific signal abnormality in the cerebral white  matter, favor small vessel disease. Subtle chronic lacunar infarct  in the right cerebellar hemisphere is new since 2015.    Electronically Signed    By: Lars Pinks M.D.    On: 07/19/2014 10:44         Verified By: Gwenyth Bender. HALL, M.D.,  CT:    23-Feb-15 20:08, CT Head Without Contrast  CT Head Without Contrast  REASON FOR EXAM:    increased weakness and falls x 3-4 days (multiple   falls)  COMMENTS:  PROCEDURE: CT  - CT HEAD WITHOUT CONTRAST  - Aug 28 2013  8:08PM     CLINICAL DATA:  Increase weakness, falls    EXAM:  CT HEAD WITHOUT CONTRAST    TECHNIQUE:  Contiguous axial images were obtained from the base of the skull  through the vertex without intravenous contrast.  COMPARISON:  MR HEAD W/O CM dated 09/28/2011    FINDINGS:  No acute intracranial hemorrhage. No focal mass lesion. No CT  evidenceof acute infarction. No midline shift or mass effect. No  hydrocephalus. Basilar cisterns are patent.    There is generalized cortical atrophy and proportional ventricular  dilatation. Extensive white matter hypodensities which extend into  the subcortical regions    Paranasal sinuses and mastoid air cells are clear.     IMPRESSION:  1. No acute intracranial findings.  2. Extensive white matter hypodensities likely represent small  vessel ischemia.  3. Marked atrophy and proportional ventricular dilatation.      Electronically Signed    By: Suzy Bouchard M.D.    On: 08/28/2013 20:14         Verified By: Rennis Golden, M.D.,    13-Jan-16 14:49, CT Head Without Contrast  CT Head Without Contrast  REASON FOR EXAM:    weakness, worse on right side  COMMENTS:       PROCEDURE: CT  - CT HEAD WITHOUT CONTRAST  - Jul 18 2014  2:49PM     CLINICAL DATA:  Weakness beginning at 4 a.m.  today. Fall. Initial  encounter.    EXAM:  CT HEAD WITHOUT CONTRAST    TECHNIQUE:  Contiguous axial images were obtained from the base of the skull  through the vertex without intravenous contrast.  COMPARISON:  Head CT scan 08/28/2013.    FINDINGS:  Atrophy and extensive chronic microvascular ischemic change are  again seen. There is no evidence of acute intracranial abnormality  including infarction, hemorrhage, mass lesion, mass effect, midline  shift or abnormal extra-axial fluid collection. No hydrocephalus or  pneumocephalus. The calvarium is intact.     IMPRESSION:  No acute abnormality.    Atrophy and chronic microvascular ischemic change.    Electronically Signed    By: Inge Rise M.D.    On: 07/18/2014 15:17         Verified By: Ramond Dial, M.D.,   ASSESSMENT AND PLAN:  Assessment/Admission Diagnosis weakness and falls, right sided weakness.  Possible TIA.  No new stroke on MRI Parkinsons Right carotid occlusion, no significant left sided stenosis.   Plan He had an MRI which shows no acute stroke.  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 This is likely a chronic right ICA occlusion and would not explain any new symptoms.  No intervention or surgery would be helpful with complete occlusion.  Will plan follow up visit in office to monitor carotid disease ongoing.  No other recs at this time except ASA therapy daily.   level 4 consult   Electronic Signatures: Algernon Huxley (MD)  (Signed 14-Jan-16 17:35)  Authored: Chief Complaint and History, PAST MEDICAL/SURGICAL HISTORY, ALLERGIES, HOME MEDICATIONS, Family and Social History, Review of Systems, Physical Exam, LABS, RADIOLOGY, Assessment and Plan   Last Updated: 14-Jan-16 17:35 by Algernon Huxley (MD)

## 2014-11-04 NOTE — Discharge Summary (Signed)
PATIENT NAME:  [name], [name] MR#:  [id number] DATE OF BIRTH:  02/13/1936  DATE OF ADMISSION:  07/18/2014 DATE OF DISCHARGE:  07/20/2014  DISCHARGE DIAGNOSIS: Parkinson disease with worsening tremor, dose of Sinemet has increased also started on Requip, responding well.   SECONDARY DIAGNOSES:   1. Parkinson disease.  2. Overactive bladder.  3.   Chronic ulcer on his right leg.    CONSULTATIONS: Physical therapy and occupational therapy along with neurology, Dr. Mellody Drown.   PROCEDURES AND RADIOLOGY:  1.  CT scan of the head without contrast on January 13 showed no acute abnormality.  2.  MRI of the brain without contrast on January 14 showed no acute intracranial abnormality. Subtle chronic lacunar infarct in the right cerebral hemisphere, which is new since 2015.  3.  Bilateral carotid Dopplers on January 14 showed complete occlusion of the right internal carotid artery. No hemodynamically significant stenosis in the left cervical carotid arteries.  4.  A 2-D echocardiogram on January 14 showed LVEF of 55%-60%. Impaired relaxation of LV diastolic filling.   MAJOR LABORATORY PANEL: Urinalysis on admission was negative.   HISTORY AND SHORT HOSPITAL COURSE: The patient is a 79 year old male with above-mentioned medical problems who was admitted for right-sided weakness and fall. Initially there was a concern for possible TIA. Please see Dr. Dr. Prudencio Pair dictated history and physical for further details. Neurology consultation was obtained with Dr. Mellody Drown who recommended complete neurological workup, including MRI of the brain, carotid Dopplers, and 2-D echocardiogram, which were all essentially negative. This was thought to be more of Parkinson disease progression. Dose of Sinemet was increased. The patient was also started on Requip by neurology and the patient was responding well. The patient was evaluated by physical and occupational therapy who recommended rehabilitation.  Vascular surgery consultation was obtained with Dr. Festus Barren considering complete occlusion of the right internal carotid artery. No further recommendation will be made other than outpatient follow up with them. The patient was doing fair did have some improvement with increasing dose of carbidopa/levodopa along with starting of Requip and he was discharged to rehabilitation facility in stable condition.   PERTINENT PHYSICAL EXAMINATION ON THE DATE OF DISCHARGE:  VITAL SIGNS: On the date of discharge, his vital signs are as follows: Temperature 98.2, heart rate 67 per minute, respirations 17 per minute, blood pressure 114/66. He was saturating 97% on room air.  CARDIOVASCULAR: S1, S2 normal. No murmurs, rubs, gallops.  LUNGS: Clear to auscultation bilaterally. No wheezing, rales, rhonchi, or crepitation.  ABDOMEN: Soft and benign.  NEUROLOGIC: He had 4/5 strength in the right extremities and 5/5 in the left side. He did have some increased tone in the right side versus left along with some resting tremor. He also had severe bradykinesia. Sensations were intact. All other physical examination remained at baseline.   DISCHARGE MEDICATIONS:   Medication Instructions  furosemide 20 mg oral tablet  1 tab(s) orally once a day, As Needed   ditropan xl 10 mg/24 hr oral tablet, extended release  1 tab(s) orally once a day   carbidopa-levodopa 50 mg-200 mg oral tablet, extended release  1 tab(s) orally every 6 hours   ropinirole 0.25 mg oral tablet  1 tab(s) orally 3 times a day     DISCHARGE DIET: Low fat, low cholesterol.   DISCHARGE ACTIVITY: As tolerated.   DISCHARGE INSTRUCTIONS AND FOLLOWUP: The patient was instructed to follow up with his primary care physician, Dr. Bari Edward, in 1-2 weeks.  He will need followup with [facility] neurology in 2-4 weeks. He will also need followup with vascular surgery, Dr. Festus Barren, in 4-6 weeks. He will get evaluation by physical therapy and management  while at the facility.   TOTAL TIME DISCHARGING THIS PATIENT: 55 minutes.    ____________________________ Ellamae Sia. Sherryll Burger, MD vss:bm D: 07/20/2014 23:28:09 ET T: 07/21/2014 00:15:44 ET JOB#: [id number]  cc: Analeise Mccleery S. Sherryll Burger, MD, <Dictator> Bari Edward, MD Annice Needy, MD Troy Sine Katrinka Blazing, MD [facility] Neurology  Ellamae Sia [facility] MD ELECTRONICALLY SIGNED 07/26/2014 10:12

## 2014-11-04 NOTE — H&P (Signed)
PATIENT NAME:  [name], [name] MR#:  [id number] DATE OF BIRTH:  [birth year]  DATE OF ADMISSION:  07/18/2014  ADMITTING PHYSICIAN: Harrington Challenger, MD   PRIMARY CARE PHYSICIAN: Bari Edward, MD   CHIEF COMPLAINT: Right-sided weakness and fall.   HISTORY OF PRESENT ILLNESS: Mr. [name] is a 79 year old African American male with past medical history significant for Parkinson disease, overactive bladder, comes to the hospital secondary to weakness and fell from his wheelchair twice this morning. The patient is a very poor historian. His daughter is at bedside, who lives with him and gives most of the history. According to the daughter, the patient has had severe Parkinson disease for a while. He has significant tremors. He is wheelchair-bound at baseline. This morning, he was in his wheelchair, he felt weak and fell to the floor. She just thought it was maybe he could not position himself right in wheelchair, so he was back in wheelchair and after a while he felt some right-sided weakness and fell onto the floor again. So, she brought him to the hospital. She also noticed couple of days ago that he had blank stare and it took a while for her to get him back. She states the patient's mother also had Parkinson and had multiple TIAs at the end and felt it was possibly a TIA. The patient did not have any fevers, chills or other symptoms at this time. He is awake, alert, oriented, no slurred speech. Passed a swallow evaluation at bedside and is able to eat a sandwich without any trouble.   PAST MEDICAL HISTORY:  1. Parkinson disease.  2. Overactive bladder.  3. Chronic ulcer on his right leg.   PAST SURGICAL HISTORY: None.   ALLERGIES: No known drug allergies.   CURRENT HOME MEDICATIONS: Not available. He takes a Parkinson medicine, Lasix and a medicine for his overactive bladder. The pharmacist is verifying at this time.   SOCIAL HISTORY: Lives at home with his daughter, wheelchair-bound at  baseline, and also has a walker. No smoking or alcohol use.   FAMILY HISTORY: Significant for Parkinson disease, TIAs in mom.   REVIEW OF SYSTEMS: CONSTITUTIONAL: No fever, fatigue, or weakness.  EYES: Positive for blurred vision, no inflammation or glaucoma.  ENT: No tinnitus, ear pain, hearing loss, epistaxis or discharge. No dysphagia.  RESPIRATORY: No cough, wheeze, hemoptysis, or COPD.  CARDIOVASCULAR: No chest pain, orthopnea, edema, arrhythmia, palpitations, or syncope.  GASTROINTESTINAL: No nausea, vomiting, diarrhea, abdominal pain, hematemesis, or melena.  GENITOURINARY: No dysuria or hematuria, increased urination present.  ENDOCRINE: No polyuria, nocturia, thyroid problems, heat or cold intolerance.  HEMATOLOGY: No anemia, easy bruising or bleeding.  SKIN: No acne, rash or lesions.  MUSCULOSKELETAL: No arthritis or gout.  NEUROLOGIC: Positive for possible TIA, right-sided weakness. No seizures. No vertigo.  PSYCHOLOGICAL: No anxiety, insomnia, depression.   PHYSICAL EXAMINATION:  VITAL SIGNS: Temperature 97.8 degrees Fahrenheit, pulse 60, respirations 18, blood pressure 228/92, pulse ox 100% on room air.  GENERAL: Well-built, well-nourished male significant tremors in appearance, not in any acute distress.  HEENT: Normocephalic, atraumatic. Pupils equal, round, reacting to light. Anicteric sclerae. Extraocular movements intact. Oropharynx clear without erythema, mass, or exudates.  NECK: Supple. No thyromegaly, JVD or carotid bruits. No lymphadenopathy.  LUNGS: Moving air bilaterally. No wheeze or crackles. No use of accessory muscles for breathing.  CARDIOVASCULAR: S1, S2, regular rate and rhythm. No murmurs, rubs, or gallops.  ABDOMEN: Soft, nontender, nondistended. No hepatosplenomegaly. Normal bowel sounds.  EXTREMITIES: Has 2+ pedal  edema, right lower extremity is wrapped up secondary to chronic but, looks like follows with the wound care. Unable to palpate dorsalis pedis  pulses. He has significant resting and also intentional tremors of his both upper extremities on exam.  NEUROLOGIC: Cranial nerves seem to be intact. No focal motor or sensory deficits. Right-sided weakness, right arm strength is 4/5, and compared to the left arm, seems like limited upper arm strength which is chronic, seems like going on for a couple of years. Strength is same, has significant tremors. Decreased strength in both lower extremities, again chronic. Sensation is intact.  PSYCHOLOGIC: The patient is awake, alert, oriented x3.   LABORATORY DATA: WBC 9.3, hemoglobin 15.1, hematocrit 47.1, platelet count 147,000. Urinalysis negative for any infection.   Sodium 139, potassium 3.7, chloride 104, bicarbonate 28, BUN 17, creatinine 0.72, glucose 83, calcium 9.0. Troponin is negative.   CT of the head without contrast showing no acute abnormality, atrophy and chronic small vessel ischemic changes.   ASSESSMENT AND PLAN: A 79 year old male with Parkinson, overactive bladder, admitted for weakness and blank stare, possible transient ischemic attack.  1. Possible transient ischemic attack. Admit to telemetry, could be just generalized weakness, worsening of Parkinson disease, but because of his family history and risk factors, will admit to tele, neuro checks, MRI of the brain and carotid Dopplers and echo have been ordered. Start aspirin. Check lipid profile at this time.  2. Parkinson disease with worsening tremor. Did not have an outpatient followup recently. Continue his Sinemet and will get neurology to see if they can adjust his Parkinson medications.  3. Overactive bladder. Continue his home medications.  4. Deep vein thrombosis prophylaxis with Lovenox.   CODE STATUS: Full code.   TIME SPENT ON ADMISSION: 50 minutes.     ____________________________ Enid Baas, MD rk:kl D: 07/18/2014 17:20:55 ET T: 07/18/2014 18:00:27 ET JOB#: [id number]  cc: Enid Baas, MD,  <Dictator> Bari Edward, MD Enid Baas MD ELECTRONICALLY SIGNED 07/23/2014 16:16

## 2014-11-04 NOTE — Consult Note (Signed)
Referring Physician:  Gladstone Lighter :   Primary Care Physician:  [facility] [place], [facility], [address] [place], [place] [address], [phone]  Reason for Consult: Admit Date: 18-Jul-2014  Chief Complaint: R sided weakness  Reason for Consult: parkinson   History of Present Illness: History of Present Illness:   79 yo RHD M with hx of Parkinsons for at least 10 years presents to [facility] secondary to R sided weakness.  Pt sees Dr. Manuella Ghazi as outpatient but has not seen him for at least 2-3 years.  He continues to have tremors and decreased movement on the R.  Pt states that he slid out of his chair today which caused him to come here today.  He states that he has a baseline of walking with a walker.  He does not report numbness, headache or vision changes.  ROS:  General denies complaints   HEENT no complaints   Lungs no complaints   Cardiac no complaints   GI no complaints   GU no complaints   Musculoskeletal no complaints   Extremities no complaints   Skin no complaints   Neuro tremor   Endocrine no complaints   Psych no complaints   Past Medical/Surgical Hx:  overactive bladder:   Parkinson's Dis.:   Past Medical/ Surgical Hx:  Past Medical History Parkinsons, bladder   Past Surgical History none   Home Medications: Medication Instructions Last Modified Date/Time  carbidopa-levodopa 50 mg-200 mg oral tablet, extended release 1 tab(s) orally 3 times a day 13-Jan-16 17:24  furosemide 20 mg oral tablet 1 tab(s) orally once a day, As Needed 13-Jan-16 17:24  Ditropan XL 10 mg/24 hr oral tablet, extended release 1 tab(s) orally once a day 13-Jan-16 17:24   Allergies:  No Known Allergies:   Allergies:  Allergies NKDA   Social/Family History: Employment Status: retired  Lives With: alone  Living Arrangements: apartment  Social History: no tob, no EtOH, no illicits  Family History: no seizures, no  stroke   Vital Signs: **Vital Signs.:   14-Jan-16 06:32  Vital Signs Type Routine  Temperature Temperature (F) 98.9  Celsius 37.1  Temperature Source oral  Pulse Pulse 68  Respirations Respirations 18  Systolic BP Systolic BP 449  Diastolic BP (mmHg) Diastolic BP (mmHg) 66  Mean BP 80  Pulse Ox % Pulse Ox % 96   Physical Exam: General: overweight, NAD  HEENT: normocephalic, sclera nonicteric, oropharynx clear  Neck: supple, no JVD, no bruits  Chest: CTA B, no wheezing, good movement  Cardiac: RRR, no murmurs, no edema, 2+ pulses  Extremities: no C/C/E, FROM   Neurologic Exam: Mental Status: alert and oriented x 3, normal speech and language, follows complex commands, low voice  Cranial Nerves: PERRLA, EOMI, nl VF, face symmetric with hypomimia, tongue midline, shoulder shrug equal  Motor Exam: 4/5 R, 5 /5 L, increased tone R>L, R resting tremor, severe bradykinesia  Deep Tendon Reflexes: 1+/4 B, downgoing plantars B  Sensory Exam: pinprick, temperature, and vibration intact B  Coordination: FTN and HTS WNL on L   Lab Results: LabObservation:  14-Jan-16 08:49   OBSERVATION Reason for Test  Routine Chem:  13-Jan-16 15:04   Cholesterol, Serum 181  Triglycerides, Serum 144  HDL (INHOUSE) 51  VLDL Cholesterol Calculated 29  LDL Cholesterol Calculated  101 (Result(s) reported on 18 Jul 2014 at 07:21PM.)  14-Jan-16 04:29   Glucose, Serum 86  BUN 15  Creatinine (comp) 0.95  Sodium,  Serum  146  Potassium, Serum  3.4  Chloride, Serum  110  CO2, Serum 28  Calcium (Total), Serum  8.4  Anion Gap 8  Osmolality (calc) 291  eGFR (African American) >60  eGFR (Non-African American) >60 (eGFR values <10m/min/1.73 m2 may be an indication of chronic kidney disease (CKD). Calculated eGFR, using the MRDR Study equation, is useful in  patients with stable renal function. The eGFR calculation will not be reliable in acutely ill patients when serum creatinine is changing rapidly.  It is not useful in patients on dialysis. The eGFR calculation may not be applicable to patients at the low and high extremes of body sizes, pregnant women, and vegetarians.)  Cardiac:  13-Jan-16 11:41   CK, Total 282 (Result(s) reported on 18 Jul 2014 at [facility])    23:45   Troponin I 0.03 (0.00-0.05 0.05 ng/mL or less: NEGATIVE  Repeat testing in 3-6 hrs  if clinically indicated. >0.05 ng/mL: POTENTIAL  MYOCARDIAL INJURY. Repeat  testing in 3-6 hrs if  clinically indicated. NOTE: An increase or decrease  of 30% or more on serial  testing suggests a  clinically important change)  CPK-MB, Serum 3.1 (Result(s) reported on 19 Jul 2014 at 01:19AM.)  Routine UA:  13-Jan-16 16:32   Color (UA) Straw  Clarity (UA) Clear  Glucose (UA) Negative  Bilirubin (UA) Negative  Ketones (UA) Negative  Specific Gravity (UA) 1.008  Blood (UA) 1+  pH (UA) 7.0  Protein (UA) Negative  Nitrite (UA) Negative  Leukocyte Esterase (UA) Negative (Result(s) reported on 18 Jul 2014 at 05:11PM.)  RBC (UA) 3 /HPF  WBC (UA) 1 /HPF  Bacteria (UA) NONE SEEN  Epithelial Cells (UA) NONE SEEN  Result(s) reported on 18 Jul 2014 at 05:11PM.  Routine Hem:  14-Jan-16 04:29   WBC (CBC) 7.1  RBC (CBC) 4.66  Hemoglobin (CBC)  12.7  Hematocrit (CBC)  39.0  Platelet Count (CBC)  147  MCV 84  MCH 27.2  MCHC 32.6  RDW 13.1  Neutrophil % 66.0  Lymphocyte % 18.3  Monocyte % 11.9  Eosinophil % 3.2  Basophil % 0.6  Neutrophil # 4.7  Lymphocyte # 1.3  Monocyte # 0.8  Eosinophil # 0.2  Basophil # 0.0 (Result(s) reported on 19 Jul 2014 at 05:20AM.)   Radiology Results: [place]    14-Jan-16 10:57, [place] Doppler Bilateral  [place] Doppler Bilateral   REASON FOR EXAM:    TIA  COMMENTS:       PROCEDURE: [place] - [place]orea[place]OTID DOPPLER BILATERAL  - Jul 19 2014 10:57AM     CLINICAL DATA:  Transient ischemic attack.    EXAM:  BILATERAL CAROTID DUPLEX ULTRASOUND    TECHNIQUE:  GPearline Cablesscale imaging, color  Doppler and duplex ultrasound were  performed of bilateral carotid and vertebral arteries in the neck.    COMPARISON:  None.  FINDINGS:  Criteria: Quantification of carotid stenosis is based on velocity  parameters that correlate the residual internal carotid diameter  with NASCET-based stenosis levels, using the diameter of the distal  internal carotid lumen as the denominator for stenosis measurement.    The following velocity measurements were obtained:    RIGHT    ICA:  Occluded.    CCA:  1785/88cm/sec    SYSTOLIC ICA/CCA RATIO:  ICA occluded.  DIASTOLIC ICA/CCA RATIO:  ICA occluded.    ECA:  77 cm/sec    LEFT    ICA:  125/19 cm/sec    CCA:  135/14  cm/sec    SYSTOLIC ICA/CCA RATIO:  0.9    DIASTOLIC ICA/CCA RATIO:  1.3    ECA:  159 cm/sec  RIGHTCAROTID ARTERY: Complete occlusion of the proximal and more  distal right internal carotid artery is noted.    RIGHT VERTEBRAL ARTERY:  Antegrade flow is noted.    LEFT CAROTID ARTERY:  No significant plaque or stenosis is noted.    LEFT VERTEBRAL ARTERY:  Antegrade flow is noted.     IMPRESSION:  Complete occlusion of the right internal carotid artery is noted.    No hemodynamically significant stenosis or plaque is noted in the  left cervical carotid arteries.  Electronically Signed    By: Sabino Dick M.D.    On: 07/19/2014 11:58         Verified By: Marveen Reeks, M.D.,  MRI:    14-Jan-16 10:14, MRI Brain Without Contrast  MRI Brain Without Contrast   REASON FOR EXAM:    TIA, right sided weakness  COMMENTS:       PROCEDURE: MR  - MR BRAIN WO CONTRAST  - Jul 19 2014 10:14AM     CLINICAL DATA:  79 year old male with right side weakness. TIA.  History of bilateral carotid surgery. Initial encounter.    EXAM:  MRI HEAD WITHOUT CONTRAST    TECHNIQUE:  Multiplanar, multiecho pulse sequences of the brain and surrounding  structures were obtained without intravenous contrast.  COMPARISON:  Head CT  without contrast 07/18/2014. Brain MRI  08/31/2013 andearlier.    FINDINGS:  Cerebral volume is stable since 2015. Major intracranial vascular  flow voids are stable. No restricted diffusion to suggest acute  infarction. No midline shift, mass effect, evidence of mass lesion,  ventriculomegaly, extra-axial collection or acute intracranial  hemorrhage. Cervicomedullary junction and pituitary are within  normal limits. Stable visualized cervical spine. Normal bone marrow  signal.    Confluent cerebral white matter T2 and FLAIR hyperintensity persists  and is nonspecific. Deep gray matter nuclei remain within normal  limits. Minimal to mild T2 heterogeneity in the brainstem.  Suggestion of a tiny chronic lacunar infarct in the right cerebellum  which is new from prior studies (series 9, image 8).    Visible internal auditory structures appear normal. Stable paranasal  sinuses and mastoids. Stable orbits soft tissues. 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 Visualized scalp  soft tissues are within normal limits.     IMPRESSION:  1.  No acute intracranial abnormality.  2. Chronic nonspecific signal abnormality in the cerebral white  matter, favor small vessel disease. Subtle chronic lacunar infarct  in the right cerebellar hemisphere is new since 2015.    Electronically Signed    By: Lars Pinks M.D.    On: 07/19/2014 10:44         Verified By: Gwenyth Bender. HALL, M.D.,  CT:    13-Jan-16 14:49, CT Head Without Contrast  CT Head Without Contrast   REASON FOR EXAM:    weakness, worse on right side  COMMENTS:       PROCEDURE: CT  - CT HEAD WITHOUT CONTRAST  - Jul 18 2014  2:49PM     CLINICAL DATA:  Weakness beginning at 4 a.m. today. Fall. Initial  encounter.    EXAM:  CT HEAD WITHOUT CONTRAST    TECHNIQUE:  Contiguous axial images were obtained from the base of the skull  through the vertex without intravenous contrast.  COMPARISON:  Head CT scan  08/28/2013.  FINDINGS:  Atrophy and extensive chronic microvascular ischemic change are  again seen. There is no evidence of acute intracranial abnormality  including infarction, hemorrhage, mass lesion, mass effect, midline  shift or abnormal extra-axial fluid collection. No hydrocephalus or  pneumocephalus. The calvarium is intact.     IMPRESSION:  No acute abnormality.    Atrophy and chronic microvascular ischemic change.    Electronically Signed    By: Inge Rise M.D.    On: 07/18/2014 15:17         Verified By: Ramond Dial, M.D.,   Radiology Impression: Radiology Impression: MRI of brain personally reviewed by me and is normal   Impression/Recommendations: Recommendations:   prior notes reviewed by me reviewed by me    Parkinson's-  this appears to be idiopathic and is not controlled;  pt has not followed with his Neurologist lately, no dyskinesia described but mild freezing,  tremor is uncontrolled but often the hardest to cure increase Sinemet to 50/200 QID add requip 0.85m TID rehab ok  will follow but pt can be discharged at anytime  Electronic Signatures: SJamison Neighbor(MD)  (Signed 14-Jan-16 14:47)  Authored: REFERRING PHYSICIAN, Primary Care Physician, Consult, History of Present Illness, Review of Systems, PAST MEDICAL/SURGICAL HISTORY, HOME MEDICATIONS, ALLERGIES, Social/Family History, NURSING VITAL SIGNS, Physical Exam-, LAB RESULTS, RADIOLOGY RESULTS, Recommendations   Last Updated: 14-Jan-16 14:47 by SJamison Neighbor(MD)

## 2015-01-15 ENCOUNTER — Encounter: Payer: Medicare Other | Attending: Surgery | Admitting: Surgery

## 2015-01-15 DIAGNOSIS — M1990 Unspecified osteoarthritis, unspecified site: Secondary | ICD-10-CM | POA: Insufficient documentation

## 2015-01-15 DIAGNOSIS — Z87891 Personal history of nicotine dependence: Secondary | ICD-10-CM | POA: Insufficient documentation

## 2015-01-15 DIAGNOSIS — I10 Essential (primary) hypertension: Secondary | ICD-10-CM | POA: Diagnosis not present

## 2015-01-15 DIAGNOSIS — I87311 Chronic venous hypertension (idiopathic) with ulcer of right lower extremity: Secondary | ICD-10-CM | POA: Diagnosis not present

## 2015-01-15 DIAGNOSIS — L97313 Non-pressure chronic ulcer of right ankle with necrosis of muscle: Secondary | ICD-10-CM | POA: Insufficient documentation

## 2015-01-15 DIAGNOSIS — G20 Parkinson's disease: Secondary | ICD-10-CM | POA: Diagnosis not present

## 2015-01-15 DIAGNOSIS — R600 Localized edema: Secondary | ICD-10-CM | POA: Insufficient documentation

## 2015-01-15 NOTE — Progress Notes (Signed)
[name], [name] ([id number]) Visit Report for 01/15/2015 Abuse/Suicide Risk Screen Details Patient Name: [name], [name] Date of Service: 01/15/2015 10:15 AM Medical Record Number: [id number] Patient Account Number: [id number] Date of Birth/Sex: 1935-11-26 (79 y.o. Male) Treating RN: Clover Mealy, RN, BSN, [place] Sink Primary Care Physician: Terance Hart, DAVID Other Clinician: Referring Physician: Bari Edward Treating Physician/Extender: Rudene Re in Treatment: 0 Abuse/Suicide Risk Screen Items Answer ABUSE/SUICIDE RISK SCREEN: Has anyone close to you tried to hurt or harm you recentlyo No Do you feel uncomfortable with anyone in your familyo No Has anyone forced you do things that you didnot want to doo No Do you have any thoughts of harming yourselfo No Patient displays signs or symptoms of abuse and/or neglect. No Electronic Signature(s) Signed: 01/15/2015 10:29:30 AM By: Elpidio Eric BSN, RN Entered By: Elpidio Eric on 01/15/2015 10:29:30 ROBERTT, BUDA ([id number]) -------------------------------------------------------------------------------- Activities of Daily Living Details Patient Name: [name], [name]. Date of Service: 01/15/2015 10:15 AM Medical Record Number: [id number] Patient Account Number: [id number] Date of Birth/Sex: 1935-08-16 (79 y.o. Male) Treating RN: Clover Mealy, RN, BSN, [place] Sink Primary Care Physician: Dorothey Baseman Other Clinician: Referring Physician: Bari Edward Treating Physician/Extender: Rudene Re in Treatment: 0 Activities of Daily Living Items Answer Activities of Daily Living (Please select one for each item) Drive Automobile Not Able Take Medications Need Assistance Use Telephone Need Assistance Care for Appearance Need Assistance Use Toilet Need Assistance Bath / Shower Need Assistance Dress Self Need Assistance Feed Self Need Assistance Walk Need Assistance Get In / Out Bed Need Assistance Housework Need Assistance Prepare  Meals Need Assistance Handle Money Need Assistance Shop for Self Need Assistance Electronic Signature(s) Signed: 01/15/2015 10:29:19 AM By: Elpidio Eric BSN, RN Entered By: Elpidio Eric on 01/15/2015 10:29:18 TIERRA, DIVELBISS ([id number]) -------------------------------------------------------------------------------- Education Assessment Details Patient Name: [name] Date of Service: 01/15/2015 10:15 AM Medical Record Number: [id number] Patient Account Number: [id number] Date of Birth/Sex: 18-Apr-1936 (79 y.o. Male) Treating RN: Clover Mealy, RN, BSN, [place] Sink Primary Care Physician: Dorothey Baseman Other Clinician: Referring Physician: Bari Edward Treating Physician/Extender: Rudene Re in Treatment: 0 Primary Learner Assessed: Patient Learning Preferences/Education Level/Primary Language Learning Preference: Explanation Highest Education Level: High School Preferred Language: English Cognitive Barrier Assessment/Beliefs Language Barrier: No Physical Barrier Assessment Impaired Vision: No Impaired Hearing: No Decreased Hand dexterity: Yes Limitations: tremors due to parkinsons Knowledge/Comprehension Assessment Knowledge Level: Medium Comprehension Level: Medium Ability to understand written Medium instructions: Ability to understand verbal Medium instructions: Motivation Assessment Anxiety Level: Calm Cooperation: Cooperative Education Importance: Acknowledges Need Perception: Coherent Willingness to Engage in Self- Medium Management Activities: Readiness to Engage in Self- Medium Management Activities: Electronic Signature(s) Signed: 01/15/2015 10:28:50 AM By: Elpidio Eric BSN, RN Entered By: Elpidio Eric on 01/15/2015 10:28:50 DMARION, PERFECT ([id number]) -------------------------------------------------------------------------------- Fall Risk Assessment Details Patient Name: [name] Date of Service: 01/15/2015 10:15 AM Medical Record  Number: [id number] Patient Account Number: [id number] Date of Birth/Sex: [birth year] (79 y.o. Male) Treating RN: Clover Mealy, RN, BSN,  Sink Primary Care Physician: Terance Hart, DAVID Other Clinician: Referring Physician: Bari Edward Treating Physician/Extender: Rudene Re in Treatment: 0 Fall Risk Assessment Items FALL RISK ASSESSMENT: History of falling - immediate or within 3 months 0 No Secondary diagnosis 0 No Ambulatory aid None/bed rest/wheelchair/nurse 0 Yes Crutches/cane/walker 0 No Furniture 0 No IV Access/Saline Lock 0 No Gait/Training Normal/bed rest/immobile 0 No Weak 10 Yes Impaired 20 Yes Mental Status Oriented to own ability 0 Yes Electronic Signature(s) Signed: 01/15/2015 10:28:13 AM By: Elpidio Eric BSN, RN Entered  ByElpidio Eric on 01/15/2015 10:28:13 [name] ([id number]) -------------------------------------------------------------------------------- Foot Assessment Details Patient Name: [name], [name]. Date of Service: 01/15/2015 10:15 AM Medical Record Number: [id number] Patient Account Number: [id number] Date of Birth/Sex: [birth year] (79 y.o. Male) Treating RN: Clover Mealy, RN, BSN, [place] Sink Primary Care Physician: Terance Hart, DAVID Other Clinician: Referring Physician: Bari Edward Treating Physician/Extender: Rudene Re in Treatment: 0 Foot Assessment Items Site Locations + = Sensation present, - = Sensation absent, C = Callus, U = Ulcer R = Redness, W = Warmth, M = Maceration, PU = Pre-ulcerative lesion F = Fissure, S = Swelling, D = Dryness Assessment Right: Left: Other Deformity: No No Prior Foot Ulcer: No No Prior Amputation: No No Charcot Joint: No No Ambulatory Status: Ambulatory With Help Assistance Device: Wheelchair Gait: Surveyor, mining) Signed: 01/15/2015 10:27:39 AM By: Elpidio Eric BSN, RN Entered By: Elpidio Eric on 01/15/2015 10:27:39 MAHESH, SEVERS  ([id number]) -------------------------------------------------------------------------------- Nutrition Risk Assessment Details Patient Name: [name] Date of Service: 01/15/2015 10:15 AM Medical Record Number: [id number] Patient Account Number: [id number] Date of Birth/Sex: [birth year] (79 y.o. Male) Treating RN: Clover Mealy, RN, BSN, [place] Sink Primary Care Physician: Dorothey Baseman Other Clinician: Referring Physician: Bari Edward Treating Physician/Extender: Rudene Re in Treatment: 0 Height (in): 68 Weight (lbs): Body Mass Index (BMI): Nutrition Risk Assessment Items NUTRITION RISK SCREEN: I have an illness or condition that made me change the kind and/or 0 No amount of food I eat I eat fewer than two meals per day 0 No I eat few fruits and vegetables, or milk products 0 No I have three or more drinks of beer, liquor or wine almost every day 0 No I have tooth or mouth problems that make it hard for me to eat 0 No I don't always have enough money to buy the food I need 0 No I eat alone most of the time 0 No I take three or more different prescribed or over-the-counter drugs a 0 No day Without wanting to, I have lost or gained 10 pounds in the last six 0 No months I am not always physically able to shop, cook and/or feed myself 0 No Nutrition Protocols Good Risk Protocol 0 No interventions needed Moderate Risk Protocol Electronic Signature(s) Signed: 01/15/2015 10:27:53 AM By: Elpidio Eric BSN, RN Entered By: Elpidio Eric on 01/15/2015 10:27:52

## 2015-01-15 NOTE — Progress Notes (Signed)
JAIEL, SARACENO ([id number]) Visit Report for 01/15/2015 Allergy List Details Patient Name: [name], [name] Date of Service: 01/15/2015 10:15 AM Medical Record Number: [id number] Patient Account Number: [id number] Date of Birth/Sex: March 01, 1936 (79 y.o. Male) Treating RN: Clover Mealy, RN, BSN, [place] Sink Primary Care Physician: Dorothey Baseman Other Clinician: Referring Physician: Bari Edward Treating Physician/Extender: Rudene Re in Treatment: 0 Allergies Active Allergies Pollens Allergy Notes Electronic Signature(s) Signed: 01/15/2015 10:30:16 AM By: Elpidio Eric BSN, RN Entered By: Elpidio Eric on 01/15/2015 10:30:15 [name] ([id number]) -------------------------------------------------------------------------------- Arrival Information Details Patient Name: [name] Date of Service: 01/15/2015 10:15 AM Medical Record Number: [id number] Patient Account Number: [id number] Date of Birth/Sex: 03/26/1936 (79 y.o. Male) Treating RN: Clover Mealy, RN, BSN, [place] Sink Primary Care Physician: Dorothey Baseman Other Clinician: Referring Physician: Bari Edward Treating Physician/Extender: Rudene Re in Treatment: 0 Visit Information Patient Arrived: Wheel Chair Arrival Time: 10:15 Accompanied By: SELF Transfer Assistance: None Patient Identification Verified: Yes Secondary Verification Process Yes Completed: Patient Requires Transmission-Based No Precautions: Patient Has Alerts: No History Since Last Visit Any new allergies or adverse reactions: No Had a fall or experienced change in activities of daily living that may affect risk of falls: Yes Signs or symptoms of abuse/neglect since last visito No Pain Present Now: No Electronic Signature(s) Signed: 01/15/2015 10:20:44 AM By: Elpidio Eric BSN, RN Entered By: Elpidio Eric on 01/15/2015 10:20:44 [name]  ([id number]) -------------------------------------------------------------------------------- Clinic Level of Care Assessment Details Patient Name: [name] Date of Service: 01/15/2015 10:15 AM Medical Record Number: [id number] Patient Account Number: [id number] Date of Birth/Sex: 1936-01-08 (79 y.o. Male) Treating RN: Clover Mealy, RN, BSN,  Sink Primary Care Physician: Dorothey Baseman Other Clinician: Referring Physician: Bari Edward Treating Physician/Extender: Rudene Re in Treatment: 0 Clinic Level of Care Assessment Items TOOL 1 Quantity Score []  - Use when EandM and Procedure is performed on INITIAL visit 0 ASSESSMENTS - Nursing Assessment / Reassessment X - General Physical Exam (combine w/ comprehensive assessment (listed just 1 20 below) when performed on new pt. evals) X - Comprehensive Assessment (HX, ROS, Risk Assessments, Wounds Hx, etc.) 1 25 ASSESSMENTS - Wound and Skin Assessment / Reassessment []  - Dermatologic / Skin Assessment (not related to wound area) 0 ASSESSMENTS - Ostomy and/or Continence Assessment and Care []  - Incontinence Assessment and Management 0 []  - Ostomy Care Assessment and Management (repouching, etc.) 0 PROCESS - Coordination of Care X - Simple Patient / Family Education for ongoing care 1 15 []  - Complex (extensive) Patient / Family Education for ongoing care 0 X - Staff obtains Chiropractor, Records, Test Results / Process Orders 1 10 []  - Staff telephones HHA, Nursing Homes / Clarify orders / etc 0 []  - Routine Transfer to another Facility (non-emergent condition) 0 []  - Routine Hospital Admission (non-emergent condition) 0 X - New Admissions / Manufacturing engineer / Ordering NPWT, Apligraf, etc. 1 15 []  - Emergency Hospital Admission (emergent condition) 0 PROCESS - Special Needs []  - Pediatric / Minor Patient Management 0 []  - Isolation Patient Management 0 JAMARR, TREINEN ([id number]) []  - Hearing / Language / Visual  special needs 0 []  - Assessment of Community assistance (transportation, D/C planning, etc.) 0 []  - Additional assistance / Altered mentation 0 []  - Support Surface(s) Assessment (bed, cushion, seat, etc.) 0 INTERVENTIONS - Miscellaneous []  - External ear exam 0 []  - Patient Transfer (multiple staff / Nurse, adult / Similar devices) 0 []  - Simple Staple / Suture removal (25 or less) 0 []  -  Complex Staple / Suture removal (26 or more) 0  - Hypo/Hyperglycemic Management (do not check if billed separately) 0 X - Ankle / Brachial Index (ABI) - do not check if billed separately 1 15 Has the patient been seen at the hospital within the last three years: Yes Total Score: 100 Level Of Care: New/Established - Level 3 Electronic Signature(s) Signed: 01/15/2015 11:02:10 AM By: Elpidio Eric BSN, RN Entered By: Elpidio Eric on 01/15/2015 11:02:10 [name] ([id number]) -------------------------------------------------------------------------------- Lower Extremity Assessment Details Patient Name: [name] Date of Service: 01/15/2015 10:15 AM Medical Record Number: [id number] Patient Account Number: [id number] Date of Birth/Sex: Nov 09, 1935 (79 y.o. Male) Treating RN: Clover Mealy, RN, BSN, [place] Sink Primary Care Physician: Terance Hart, DAVID Other Clinician: Referring Physician: Bari Edward Treating Physician/Extender: Rudene Re in Treatment: 0 Edema Assessment Assessed: Kyra Searles: No] Franne Forts: No] E[Left: dema] [Right: :] Calf Left: Right: Point of Measurement: 40 cm From Medial Instep 42.6 cm 48.8 cm Ankle Left: Right: Point of Measurement: 10 cm From Medial Instep 24.5 cm 30.5 cm Vascular Assessment Pulses: Posterior Tibial Palpable: [Left:No] [Right:No] Doppler: [Left:Multiphasic] [Right:Multiphasic] Dorsalis Pedis Palpable: [Left:Yes] [Right:Yes] Doppler: [Left:Multiphasic] [Right:Multiphasic] Extremity colors, hair growth, and conditions: Extremity Color: [Left:Dusky]  [Right:Dusky] Hair Growth on Extremity: [Left:No] [Right:No] Temperature of Extremity: [Left:Warm] [Right:Warm] Capillary Refill: [Left:< 3 seconds] [Right:< 3 seconds] Dependent Rubor: [Left:No] [Right:No] Blanched when Elevated: [Left:No] [Right:No] Lipodermatosclerosis: [Left:No] [Right:No] Blood Pressure: Brachial: [Right:163] Dorsalis Pedis: 185 [Left:Dorsalis Pedis: 150] Ankle: Posterior Tibial: 180 [Left:Posterior Tibial: 160 1.13] [Right:0.98] Toe Nail Assessment Left: Right: Thick: Yes Yes GENEROSO, CROPPER ([id number]) Discolored: Yes Yes Deformed: Yes Yes Improper Length and Hygiene: Yes Yes Electronic Signature(s) Signed: 01/15/2015 10:46:06 AM By: Elpidio Eric BSN, RN Entered By: Elpidio Eric on 01/15/2015 10:46:05 [name] ([id number]) -------------------------------------------------------------------------------- Multi Wound Chart Details Patient Name: [name] Date of Service: 01/15/2015 10:15 AM Medical Record Number: [id number] Patient Account Number: [id number] Date of Birth/Sex: 02-11-1936 (79 y.o. Male) Treating RN: Clover Mealy, RN, BSN, [place] Sink Primary Care Physician: Dorothey Baseman Other Clinician: Referring Physician: Bari Edward Treating Physician/Extender: Rudene Re in Treatment: 0 Vital Signs Height(in): 68 Pulse(bpm): 66 Weight(lbs): Blood Pressure 161/63 (mmHg): Body Mass Index(BMI): Temperature(F): 97.9 Respiratory Rate 22 (breaths/min): Photos: [5:No Photos] [N/A:N/A] Wound Location: [5:Right Lower Leg - Medial] [N/A:N/A] Wounding Event: [5:Gradually Appeared] [N/A:N/A] Primary Etiology: [5:Venous Leg Ulcer] [N/A:N/A] Comorbid History: [5:Cataracts, Anemia, Hypertension, Osteoarthritis] [N/A:N/A] Date Acquired: [5:11/13/2014] [N/A:N/A] Weeks of Treatment: [5:0] [N/A:N/A] Wound Status: [5:Open] [N/A:N/A] Measurements L x W x D 14.5x9x0.3 [N/A:N/A] (cm) Area (cm) : [5:102.494] [N/A:N/A] Volume (cm) :  [5:30.748] [N/A:N/A] % Reduction in Area: [5:0.00%] [N/A:N/A] % Reduction in Volume: 0.00% [N/A:N/A] Classification: [5:Full Thickness Without Exposed Support Structures] [N/A:N/A] Exudate Amount: [5:Large] [N/A:N/A] Exudate Type: [5:Serosanguineous] [N/A:N/A] Exudate Color: [5:red, brown] [N/A:N/A] Foul Odor After [5:Yes] [N/A:N/A] Cleansing: Odor Anticipated Due to No [N/A:N/A] Product Use: Wound Margin: [5:Distinct, outline attached] [N/A:N/A] Granulation Amount: [5:Medium (34-66%)] [N/A:N/A] Granulation Quality: [5:Pink, Pale] [N/A:N/A] Necrotic Amount: [5:Small (1-33%)] [N/A:N/A] Exposed Structures: Fascia: No N/A N/A Fat: No Tendon: No Muscle: No Joint: No Bone: No Limited to Skin Breakdown Epithelialization: None N/A N/A Periwound Skin Texture: Edema: Yes N/A N/A Excoriation: No Induration: No Callus: No Crepitus: No Fluctuance: No Friable: No Rash: No Scarring: No Periwound Skin Moist: Yes N/A N/A Moisture: Maceration: No Dry/Scaly: No Periwound Skin Color: Mottled: Yes N/A N/A Atrophie Blanche: No Cyanosis: No Ecchymosis: No Erythema: No Hemosiderin Staining: No Pallor: No Rubor: No Temperature: No Abnormality N/A N/A Tenderness on  No N/A N/A Palpation: Wound Preparation: Ulcer Cleansing: N/A N/A Rinsed/Irrigated with Saline Topical Anesthetic Applied: Other: lidocaine 4% Treatment Notes Electronic Signature(s) Signed: 01/15/2015 11:01:40 AM By: Elpidio Eric BSN, RN Entered By: Elpidio Eric on 01/15/2015 11:01:40 RAFEAL, SKIBICKI ([id number]) -------------------------------------------------------------------------------- Multi-Disciplinary Care Plan Details Patient Name: JUSTYCE, BABY. Date of Service: 01/15/2015 10:15 AM Medical Record Number: [id number] Patient Account Number: [id number] Date of Birth/Sex: Nov 21, 1935 (79 y.o. Male) Treating RN: Clover Mealy, RN, BSN, [place] Sink Primary Care Physician: Dorothey Baseman Other Clinician: Referring  Physician: Bari Edward Treating Physician/Extender: Rudene Re in Treatment: 0 Active Inactive Orientation to the Wound Care Program Nursing Diagnoses: Knowledge deficit related to the wound healing center program Goals: Patient/caregiver will verbalize understanding of the Wound Healing Center Program Date Initiated: 01/15/2015 Goal Status: Active Interventions: Provide education on orientation to the wound center Notes: Venous Leg Ulcer Nursing Diagnoses: Knowledge deficit related to disease process and management Potential for venous Insuffiency (use before diagnosis confirmed) Goals: Patient will maintain optimal edema control Date Initiated: 01/15/2015 Goal Status: Active Patient/caregiver will verbalize understanding of disease process and disease management Date Initiated: 01/15/2015 Goal Status: Active Verify adequate tissue perfusion prior to therapeutic compression application Date Initiated: 01/15/2015 Goal Status: Active Interventions: Assess peripheral edema status every visit. Compression as ordered Provide education on venous insufficiency RAHM, MINIX ([id number]) Treatment Activities: Test ordered outside of clinic : 01/15/2015 Therapeutic compression applied : 01/15/2015 Venous Duplex Doppler : 01/15/2015 Notes: Wound/Skin Impairment Nursing Diagnoses: Impaired tissue integrity Knowledge deficit related to ulceration/compromised skin integrity Goals: Patient/caregiver will verbalize understanding of skin care regimen Date Initiated: 01/15/2015 Goal Status: Active Ulcer/skin breakdown will have a volume reduction of 30% by week 4 Date Initiated: 01/15/2015 Goal Status: Active Ulcer/skin breakdown will have a volume reduction of 50% by week 8 Date Initiated: 01/15/2015 Goal Status: Active Ulcer/skin breakdown will have a volume reduction of 80% by week 12 Date Initiated: 01/15/2015 Goal Status: Active Ulcer/skin breakdown will heal within  14 weeks Date Initiated: 01/15/2015 Goal Status: Active Interventions: Assess patient/caregiver ability to perform ulcer/skin care regimen upon admission and as needed Assess ulceration(s) every visit Provide education on ulcer and skin care Notes: Electronic Signature(s) Signed: 01/15/2015 11:01:28 AM By: Elpidio Eric BSN, RN Entered By: Elpidio Eric on 01/15/2015 11:01:28 KEEN, EWALT ([id number]) -------------------------------------------------------------------------------- Pain Assessment Details Patient Name: [name] Date of Service: 01/15/2015 10:15 AM Medical Record Number: [id number] Patient Account Number: [id number] Date of Birth/Sex: Jul 12, 1935 (79 y.o. Male) Treating RN: Clover Mealy, RN, BSN, [place] Sink Primary Care Physician: Dorothey Baseman Other Clinician: Referring Physician: Bari Edward Treating Physician/Extender: Rudene Re in Treatment: 0 Active Problems Location of Pain Severity and Description of Pain Patient Has Paino No Site Locations Pain Management and Medication Current Pain Management: Electronic Signature(s) Signed: 01/15/2015 10:21:00 AM By: Elpidio Eric BSN, RN Entered By: Elpidio Eric on 01/15/2015 10:21:00 [name] ([id number]) -------------------------------------------------------------------------------- Wound Assessment Details Patient Name: TREYLIN, BURTCH. Date of Service: 01/15/2015 10:15 AM Medical Record Number: [id number] Patient Account Number: [id number] Date of Birth/Sex: [birth year] (79 y.o. Male) Treating RN: Clover Mealy, RN, BSN, [place] Sink Primary Care Physician: Dorothey Baseman Other Clinician: Referring Physician: Bari Edward Treating Physician/Extender: Rudene Re in Treatment: 0 Wound Status Wound Number: 5 Primary Venous Leg Ulcer Etiology: Wound Location: Right Lower Leg - Medial Wound Status: Open Wounding Event: Gradually Appeared Comorbid Cataracts, Anemia, Hypertension, Date Acquired:  11/13/2014 History: Osteoarthritis Weeks Of Treatment: 0 Clustered Wound: No Wound Measurements Length: (cm) 14.5 Width: (cm) 9 Depth: (  cm) 0.3 Area: (cm) 102.494 Volume: (cm) 30.748 % Reduction in Area: 0% % Reduction in Volume: 0% Epithelialization: None Tunneling: No Undermining: No Wound Description Full Thickness Without Exposed Classification: Support Structures Wound Margin: Distinct, outline attached Exudate Large Amount: Exudate Type: Serosanguineous Exudate Color: red, brown Foul Odor After Cleansing: Yes Due to Product Use: No Wound Bed Granulation Amount: Medium (34-66%) Exposed Structure Granulation Quality: Pink, Pale Fascia Exposed: No Necrotic Amount: Small (1-33%) Fat Layer Exposed: No Necrotic Quality: Adherent Slough Tendon Exposed: No Muscle Exposed: No Joint Exposed: No Bone Exposed: No Limited to Skin Breakdown Periwound Skin Texture Texture Color No Abnormalities Noted: No No Abnormalities Noted: No Callus: No Atrophie Blanche: No Crepitus: No Cyanosis: No NAZIRE, FRUTH ([id number]) Excoriation: No Ecchymosis: No Fluctuance: No Erythema: No Friable: No Hemosiderin Staining: No Induration: No Mottled: Yes Localized Edema: Yes Pallor: No Rash: No Rubor: No Scarring: No Temperature / Pain Moisture Temperature: No Abnormality No Abnormalities Noted: No Dry / Scaly: No Maceration: No Moist: Yes Wound Preparation Ulcer Cleansing: Rinsed/Irrigated with Saline Topical Anesthetic Applied: Other: lidocaine 4%, Electronic Signature(s) Signed: 01/15/2015 10:36:26 AM By: Elpidio Eric BSN, RN Entered By: Elpidio Eric on 01/15/2015 10:36:25 FELTON, BUCZYNSKI ([id number]) -------------------------------------------------------------------------------- Vitals Details Patient Name: [name] Date of Service: 01/15/2015 10:15 AM Medical Record Number: [id number] Patient Account Number: [id number] Date of Birth/Sex:  10-18-1935 (79 y.o. Male) Treating RN: Clover Mealy, RN, BSN, [place] Sink Primary Care Physician: Terance Hart, DAVID Other Clinician: Referring Physician: Bari Edward Treating Physician/Extender: Rudene Re in Treatment: 0 Vital Signs Time Taken: 10:21 Temperature (F): 97.9 Height (in): 68 Pulse (bpm): 66 Source: Stated Respiratory Rate (breaths/min): 22 Blood Pressure (mmHg): 161/63 Reference Range: 80 - 120 mg / dl Electronic Signature(s) Signed: 01/15/2015 10:24:19 AM By: Elpidio Eric BSN, RN Entered By: Elpidio Eric on 01/15/2015 10:24:19

## 2015-01-16 NOTE — Progress Notes (Signed)
TANIS, BURNLEY ([id number]) Visit Report for 01/15/2015 Chief Complaint Document Details Patient Name: [name], [name] Date of Service: 01/15/2015 10:15 AM Medical Record Number: [id number] Patient Account Number: [id number] Date of Birth/Sex: 11-03-1935 (79 y.o. Male) Treating RN: Primary Care Physician: Dorothey Baseman Other Clinician: Referring Physician: Bari Edward Treating Physician/Extender: Rudene Re in Treatment: 0 Information Obtained from: Patient Chief Complaint Patient presents for treatment of an open ulcer due to venous insufficiency. The patient has had a open wound on his right medial ankle for at least 3 years and was last seen in the wound clinic in February 2015. He was lost to follow-up after that. Electronic Signature(s) Signed: 01/15/2015 11:10:14 AM By: Evlyn Kanner MD, FACS Entered By: Evlyn Kanner on 01/15/2015 11:10:14 HELIODORO, DOMAGALSKI ([id number]) -------------------------------------------------------------------------------- Debridement Details Patient Name: [name], [name] Date of Service: 01/15/2015 10:15 AM Medical Record Number: [id number] Patient Account Number: [id number] Date of Birth/Sex: 1935-12-27 (79 y.o. Male) Treating RN: Primary Care Physician: Dorothey Baseman Other Clinician: Referring Physician: Bari Edward Treating Physician/Extender: Rudene Re in Treatment: 0 Debridement Performed for Wound #5 Right,Medial Lower Leg Assessment: Performed By: Physician Tristan Schroeder., MD Debridement: Debridement Pre-procedure Yes Verification/Time Out Taken: Start Time: 11:00 Pain Control: Lidocaine 5% topical ointment Level: Skin/Subcutaneous Tissue Total Area Debrided (L x 8 (cm) x 9 (cm) = 72 (cm) W): Tissue and other Viable, Eschar, Fibrin/Slough, Skin, Subcutaneous material debrided: Instrument: Curette Bleeding: Minimum Hemostasis Achieved: Pressure End Time: 11:05 Procedural Pain: 0 Post  Procedural Pain: 0 Response to Treatment: Procedure was tolerated well Post Debridement Measurements of Total Wound Length: (cm) 14.5 Width: (cm) 9 Depth: (cm) 0.3 Volume: (cm) 30.748 Electronic Signature(s) Signed: 01/15/2015 11:09:19 AM By: Evlyn Kanner MD, FACS Entered By: Evlyn Kanner on 01/15/2015 11:09:19 [name] ([id number]) -------------------------------------------------------------------------------- HPI Details Patient Name: [name] Date of Service: 01/15/2015 10:15 AM Medical Record Number: [id number] Patient Account Number: [id number] Date of Birth/Sex: 05-25-1936 (79 y.o. Male) Treating RN: Primary Care Physician: Dorothey Baseman Other Clinician: Referring Physician: Bari Edward Treating Physician/Extender: Rudene Re in Treatment: 0 History of Present Illness Location: right lower extremity ulceration Quality: Patient reports experiencing a dull pain to affected area(s). Severity: Patient states wound are getting worse. 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 Modifying Factors: Consults to this date include:has received treatment in the wound center in the past but stopped coming here since February 2015 Associated Signs and Symptoms: Patient reports having difficulty standing for long periods. HPI Description: is an 79 year old male who is known to have progressive weakness and has been in a nursing home for a while has had chronic lower extremity edema both legs and a large open wound on the right lower extremity which is has at least for about 3-4 years. The patient was seen in the wound clinic before and has been treated until February  2015 when he was lost to follow-up. Past medical cyst history significant for hypertension, gout, venous stasis ulcers, Parkinson's [name], constipation. He's not been a diabetic but his last hemoglobin A1c was 6 in March 2015. There is documentation that he's had endovenous ablation of bilateral varicose veins but we have no  documentation about this and this may be done at least 4-5 years ago. Electronic Signature(s) Signed: 01/15/2015 11:14:25 AM By: Evlyn Kanner MD, FACS Entered By: Evlyn Kanner on 01/15/2015 11:14:24 DUSAN, LIPFORD ([id number]) -------------------------------------------------------------------------------- Physical Exam Details Patient Name: [name], [name] Date of Service: 01/15/2015 10:15 AM Medical Record Number: [id number] Patient Account Number: [id number] Date of Birth/Sex: September 22, 1935 (79 y.o. Male) Treating RN: Primary Care Physician: Dorothey Baseman Other Clinician: Referring Physician: Bari Edward Treating Physician/Extender: Rudene Re in Treatment: 0 Constitutional . Pulse regular. Respirations normal and unlabored. Afebrile. . Eyes Nonicteric. Reactive to light. Ears, Nose, Mouth, and Throat Lips, teeth, and gums WNL.Marland Kitchen Moist mucosa without lesions . Neck supple and nontender. No palpable supraclavicular or cervical adenopathy. Normal sized without goiter. Respiratory WNL. No retractions.. Cardiovascular Pedal Pulses WNL. ABI on the left is 1.18 on the right is 0.92. he has chronic stasis dermatitis both lower extremities and the edema on the left is less than the edema on the right. On the right he has a large ulcerated wound and has a lot of slough and is down to the muscle.. Chest Breasts symmetical and no nipple discharge.. Breast tissue WNL, no masses, lumps, or tenderness.. Gastrointestinal (GI) Abdomen without masses or tenderness.. No liver or spleen enlargement or tenderness.. Musculoskeletal Adexa without  tenderness or enlargement.. Digits and nails w/o clubbing, cyanosis, infection, petechiae, ischemia, or inflammatory conditions.. Integumentary (Hair, Skin) No suspicious lesions. No crepitus or fluctuance. No peri-wound warmth or erythema. No masses.Marland Kitchen Psychiatric Judgement and insight Intact.. No evidence of depression, anxiety, or agitation.. Electronic Signature(s) Signed: 01/15/2015 11:15:21 AM By: Evlyn Kanner MD, FACS Entered By: Evlyn Kanner on 01/15/2015 11:15:19 CEZAR, MISIASZEK ([id number]) -------------------------------------------------------------------------------- Physician Orders Details Patient Name: [name], [name] Date of Service: 01/15/2015 10:15 AM Medical Record Number: [id number] Patient Account Number: [id number] Date of Birth/Sex: 02-Jan-1936 (79 y.o. Male) Treating RN: Clover Mealy, RN, BSN, [place] Primary Care Physician: Dorothey Baseman Other Clinician: Referring Physician: Bari Edward Treating Physician/Extender: Rudene Re in Treatment: 0 Verbal / Phone Orders: Yes Clinician: Afful, RN, BSN, Rita Read Back and Verified: Yes Diagnosis Coding Skin Barriers/Peri-Wound Care Wound #5 Right,Medial Lower Leg o Barrier cream o Moisturizing lotion Primary Wound Dressing Wound #5 Right,Medial Lower Leg o Aquacel Ag Secondary Dressing Wound #5 Right,Medial Lower Leg o Gauze, ABD and Kerlix/Conform Dressing Change Frequency Wound #5 Right,Medial Lower Leg o Change dressing every week Follow-up Appointments Wound #5 Right,Medial Lower Leg o Return Appointment in 1 week. Edema Control Wound #5 Right,Medial Lower Leg o Unna Boot to Right Lower Extremity Consults o Vascular - bilateral venous duplex oooo OWYN, RAULSTON ([id number]) o Podiatry - bilateral feet oooo Electronic Signature(s) Signed: 01/15/2015 11:00:29 AM By: Elpidio Eric BSN, RN Signed: 01/15/2015 12:24:37 PM By: Evlyn Kanner MD, FACS Previous Signature:  01/15/2015 10:59:59 AM Version By: Elpidio Eric BSN, RN Previous Signature: 01/15/2015 10:58:57 AM Version By: Elpidio Eric BSN, RN Entered By: Elpidio Eric on 01/15/2015 11:00:29 STONEWALL, DOSS ([id number]) -------------------------------------------------------------------------------- Problem List Details Patient Name: [name], [name] Date of Service: 01/15/2015 10:15 AM Medical Record Number: [id number] Patient Account Number: [id number] Date of Birth/Sex: 1935/07/21 (79 y.o. 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 Date of Service: 01/15/2015 10:15 AM Medical Record Number: [id number] Patient Account Number: [id number] Date of Birth/Sex: 04/01/1936 (79 y.o. Male) Treating RN: Primary Care Physician: Dorothey Baseman Other Clinician: Referring Physician: Bari Edward Treating Physician/Extender: Rudene Re in Treatment: 0 Subjective Chief Complaint Information obtained from Patient Patient presents for treatment of an open ulcer due to venous insufficiency. The patient has had a open wound on his right medial ankle for at least 3 years and was last seen in the wound clinic in  February 2015. He was lost to follow-up after that. History of Present Illness (HPI) The following HPI elements were documented for the patient's wound: Location: right lower extremity ulceration Quality: Patient reports experiencing a dull pain to affected area(s). Severity: Patient states wound are getting worse. 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 Modifying Factors: Consults to this date include:has received treatment in the wound center in the past but stopped coming here since February 2015 Associated Signs and Symptoms: Patient reports having difficulty standing for long periods. is an 79 year old male who is known to have progressive weakness and has been in a nursing home for a while has had chronic lower extremity edema both legs and a large open wound on the right lower extremity which is has at least for about 3-4 years. The patient was seen in the wound clinic before and has been treated until February 2015 when he was lost to follow-up. Past medical cyst history significant for hypertension, gout, venous stasis ulcers, Parkinson's [name], constipation. He's not been a diabetic but his last hemoglobin A1c was 6 in March 2015. There is documentation that he's had endovenous ablation of bilateral varicose veins but we have no documentation about this and this may be done at least 4-5 years ago. Wound History Patient presents with 1 open wound that has been present for approximately years. Patient has been treating wound in the following manner: alginate. The wound has been healed in the past but has re- opened. Laboratory tests have not been performed in the last month. Patient  reportedly has not tested positive for an antibiotic resistant organism. Patient reportedly has not tested positive for osteomyelitis. Patient reportedly has had testing performed to evaluate circulation in the legs. Patient experiences the following problems associated with their wounds: infection, swelling. Patient History LARRIE, FRAIZER ([id number]) Information obtained from Patient. Allergies Pollens Family History Diabetes - Maternal Grandparents, No family history of Cancer, Heart Disease, Hypertension, Kidney Disease, Lung Disease, Seizures, Stroke, Thyroid Problems, Tuberculosis. Social History Former smoker - sine 1960, Marital Status - Single, Alcohol Use - Never, Caffeine Use - Rarely. Medical History Eyes Patient has history of Cataracts - removed Ear/Nose/Mouth/Throat Denies history of Chronic sinus problems/congestion, Middle ear problems Hematologic/Lymphatic Patient has history of Anemia Cardiovascular Patient has history of Hypertension Gastrointestinal Denies history of Cirrhosis , Colitis, Crohn s, Hepatitis A, Hepatitis B, Hepatitis C Musculoskeletal Patient has history of Osteoarthritis  Denies history of Gout Neurologic Denies history of Dementia, Neuropathy, Quadriplegia, Paraplegia, Seizure Disorder Oncologic Denies history of Received Chemotherapy, Received Radiation Psychiatric Denies history of Anorexia/bulimia Medical And Surgical History Notes Neurologic parkinsons Review of Systems (ROS) Ear/Nose/Mouth/Throat The patient has no complaints or symptoms. Hematologic/Lymphatic The patient has no complaints or symptoms. Respiratory The patient has no complaints or symptoms. Cardiovascular The patient has no complaints or symptoms. Gastrointestinal The patient has no complaints or symptoms. Endocrine [place], FASSNACHT ([id number]) The patient has no complaints or symptoms. Genitourinary The patient has no complaints or  symptoms. Immunological The patient has no complaints or symptoms. Integumentary (Skin) Complains or has symptoms of Wounds, Swelling. Musculoskeletal The patient has no complaints or symptoms. Neurologic The patient has no complaints or symptoms. Oncologic The patient has no complaints or symptoms. Psychiatric The patient has no complaints or symptoms. Medications: The most recent medications he takes his Lasix 20 mg daily, Ditropan XL 10 mg daily, Requip 0.25 mg 3 times daily. Objective Constitutional Pulse regular. Respirations normal and unlabored. Afebrile. Vitals Time Taken: 10:21 AM, Height: 68 in, Source: Stated, Temperature: 97.9 F, Pulse: 66 bpm, Respiratory Rate: 22 breaths/min, Blood Pressure: 161/63 mmHg. Eyes Nonicteric. Reactive to light. Ears, Nose, Mouth, and Throat Lips, teeth, and gums WNL.Marland Kitchen Moist mucosa without lesions . Neck supple and nontender. No palpable supraclavicular or cervical adenopathy. Normal sized without goiter. Respiratory WNL. No retractions.. Cardiovascular HASON, OFARRELL ([id number]) Pedal Pulses WNL. ABI on the left is 1.18 on the right is 0.92. he has chronic stasis dermatitis both lower extremities and the edema on the left is less than the edema on the right. On the right he has a large ulcerated wound and has a lot of slough and is down to the muscle.. Chest Breasts symmetical and no nipple discharge.. Breast tissue WNL, no masses, lumps, or tenderness.. Gastrointestinal (GI) Abdomen without masses or tenderness.. No liver or spleen enlargement or tenderness.. Musculoskeletal Adexa without tenderness or enlargement.. Digits and nails w/o clubbing, cyanosis, infection, petechiae, ischemia, or inflammatory conditions.Marland Kitchen Psychiatric Judgement and insight Intact.. No evidence of depression, anxiety, or agitation.. Integumentary (Hair, Skin) No suspicious lesions. No crepitus or fluctuance. No peri-wound warmth or erythema. No  masses.. Wound #5 status is Open. Original cause of wound was Gradually Appeared. The wound is located on the Right,Medial Lower Leg. The wound measures 14.5cm length x 9cm width x 0.3cm depth; 102.494cm^2 area and 30.748cm^3 volume. The wound is limited to skin breakdown. There is no tunneling or undermining noted. There is a large amount of serosanguineous drainage noted. The wound margin is distinct with the outline attached to the wound base. There is medium (34-66%) pink, pale granulation within the wound bed. There is a small (1-33%) amount of necrotic tissue within the wound bed including Adherent Slough. The periwound skin appearance exhibited: Localized Edema, Moist, Mottled. The periwound skin appearance did not exhibit: Callus, Crepitus, Excoriation, Fluctuance, Friable, Induration, Rash, Scarring, Dry/Scaly, Maceration, Atrophie Blanche, Cyanosis, Ecchymosis, Hemosiderin Staining, Pallor, Rubor, Erythema. Periwound temperature was noted as No Abnormality. 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 We do not have any recent documentation of  his vascular venous studies and we will get this done. In the meanwhile I would like to start on silver alginate and a Unna's boot and have discussed with him the need for elevation of both limbs. He will be seen here on regular basis and I have emphasized to him that he needs to come regularly and not be lost to follow-up. He seems to be quite willing to do so and we will communicate this to his nursing staff. Procedures Wound #5 Wound #5 is a  Venous Leg Ulcer located on the Right,Medial Lower Leg . There was a Skin/Subcutaneous Tissue Debridement ([id number]) debridement with total area of 72 sq cm performed by Tristan Schroeder., MD. with the following instrument(s): Curette to remove Viable tissue/material including Fibrin/Slough, Eschar, Skin, and Subcutaneous after achieving pain control using Lidocaine 5% topical ointment. A time out was conducted prior to the start of the procedure. A Minimum amount of bleeding was controlled with Pressure. The procedure was tolerated well with a pain level of 0 throughout and a pain level of 0 following the procedure. Post Debridement Measurements: 14.5cm length x 9cm width x 0.3cm depth; 30.748cm^3 volume. Plan Skin Barriers/Peri-Wound Care: Wound #5 Right,Medial Lower Leg: Barrier cream Moisturizing lotion Primary Wound Dressing: Wound #5 Right,Medial Lower Leg: Aquacel Ag Secondary Dressing: Wound #5 Right,Medial Lower Leg: Gauze, ABD and Kerlix/Conform Dressing Change Frequency: Wound #5 Right,Medial Lower Leg: Change dressing every week Follow-up Appointments: Wound #5 Right,Medial Lower Leg: Return Appointment in 1 week. Edema Control: Wound #5 Right,Medial Lower Leg: Unna Boot to Right Lower Extremity Consults ordered were: Vascular - bilateral venous duplex, Podiatry - bilateral feet [name], [name] ([id number]) The patient has had a long history of varicose veins and venous hypertension and by history he says he's had bilateral varicose vein surgery done several years ago. We do not have any recent documentation of his vascular venous studies and we will get this done. In the meanwhile I would like to start on silver alginate and a Unna's boot and have discussed with him the need for elevation of both limbs. He will be seen here on regular basis and I have emphasized to him that he needs to come regularly and not be lost to follow-up. He seems to be quite willing to do  so and we will communicate this to his nursing staff. Electronic Signature(s) Signed: 01/15/2015 11:18:14 AM By: Evlyn Kanner MD, FACS Entered By: Evlyn Kanner on 01/15/2015 11:18:14 [name], [name] ([id number]) -------------------------------------------------------------------------------- ROS/PFSH Details Patient Name: [name], [name] Date of Service: 01/15/2015 10:15 AM Medical Record Number: [id number] Patient Account Number: [id number] Date of Birth/Sex: June 11, 1936 (79 y.o. Male) Treating RN: Clover Mealy, RN, BSN, Rita Primary Care Physician: Terance Hart, DAVID Other Clinician: Referring Physician: Bari Edward Treating Physician/Extender: Rudene Re in Treatment: 0 Information Obtained From Patient Wound History Do you currently have one or [name] open woundso Yes How many open wounds do you currently haveo 1 Approximately how long have you had your woundso years How have you been treating your wound(s) until nowo alginate Has your wound(s) ever healed and then re-openedo Yes Have you had any lab work done in the past montho No Have you tested positive for an antibiotic resistant organism (MRSA, VRE)o No Have you tested positive for osteomyelitis (bone infection)o No Have you had any tests for circulation on your legso Yes Who ordered the testo [facility] Have you had other problems associated with your woundso Infection, Swelling Integumentary (Skin) Complaints and Symptoms: Positive for:  Wounds; Swelling Eyes Medical History: Positive for: Cataracts - removed Ear/Nose/Mouth/Throat Complaints and Symptoms: No Complaints or Symptoms Medical History: Negative for: Chronic sinus problems/congestion; Middle ear problems Hematologic/Lymphatic Complaints and Symptoms: No Complaints or Symptoms Medical History: Positive for: Anemia [name], [name] ([id number]) Respiratory Complaints and Symptoms: No Complaints or Symptoms Cardiovascular Complaints and  Symptoms: No Complaints or Symptoms Medical History: Positive for: Hypertension Gastrointestinal Complaints and Symptoms: No Complaints or Symptoms Medical History: Negative for: Cirrhosis ; Colitis; Crohnos; Hepatitis A; Hepatitis B; Hepatitis C Endocrine Complaints and Symptoms: No Complaints or Symptoms Genitourinary Complaints and Symptoms: No Complaints or Symptoms Immunological Complaints and Symptoms: No Complaints or Symptoms Musculoskeletal Complaints and Symptoms: No Complaints or Symptoms Medical History: Positive for: Osteoarthritis Negative for: Gout Neurologic Complaints and Symptoms: No Complaints or Symptoms Medical History: Negative for: Dementia; Neuropathy; Quadriplegia; Paraplegia; Seizure Disorder Past Medical History Notes: [name], [name] ([id number]) parkinsons Oncologic Complaints and Symptoms: No Complaints or Symptoms Medical History: Negative for: Received Chemotherapy; Received Radiation Psychiatric Complaints and Symptoms: No Complaints or Symptoms Medical History: Negative for: Anorexia/bulimia HBO Extended History Items Eyes: Cataracts Family and Social History Cancer: No; Diabetes: Yes - Maternal Grandparents; Heart Disease: No; Hypertension: No; Kidney Disease: No; Lung Disease: No; Seizures: No; Stroke: No; Thyroid Problems: No; Tuberculosis: No; Former smoker - sine 1960; Marital Status - Single; Alcohol Use: Never; Caffeine Use: Rarely; Financial Concerns: No; Food, Clothing or Shelter Needs: No; Support System Lacking: No; Transportation Concerns: No; Advanced Directives: No; Patient wants information on Advanced Directives; Advanced Directives information was provided to patient; Do not resuscitate: No; Living Will: Yes (Not Provided); Medical Power of Attorney: No Physician Affirmation I have reviewed and agree with the above information. Electronic Signature(s) Signed: 01/15/2015 10:35:36 AM By: Evlyn Kanner MD,  FACS Signed: 01/15/2015 4:57:23 PM By: Elpidio Eric BSN, RN Previous Signature: 01/15/2015 10:27:15 AM Version By: Elpidio Eric BSN, RN Entered By: Evlyn Kanner on 01/15/2015 10:35:35 [name], DUPRAS ([id number]) -------------------------------------------------------------------------------- SuperBill Details Patient Name: CORDARIOUS, ZEEK. Date of Service: 01/15/2015 Medical Record Number: [id number] Patient Account Number: [id number] Date of Birth/Sex: 1936-05-12 (79 y.o. 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 E66.01 Morbid (severe) obesity due to excess calories Facility Procedures CPT4 Code Description: [id number] - WOUND CARE VISIT-LEV 3 EST PT Modifier: Quantity: 1 CPT4 Code Description: 25366440 11042 - DEB SUBQ TISSUE 20 SQ CM/< ICD-10 Description Diagnosis I87.311 Chronic venous hypertension (idiopathic) with ulcer of 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 E66.01 Morbid (severe) obesity  due to excess calories Modifier: right lowe s of muscle Quantity: 1 r extremity CPT4 Code Description: [id number] 11045 - DEB SUBQ TISS EA ADDL 20CM ICD-10 Description Diagnosis I87.311 Chronic venous hypertension (idiopathic) with ulcer of 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 E66.01 Morbid (severe) obesity  due to excess calories Modifier: right lowe s of muscle Quantity: 3 r extremity Physician Procedures CPT4 Code Description: 6387564 33295 - WC PHYS LEVEL 4 - EST PT ICD-10 Description Diagnosis I87.311 Chronic venous hypertension (idiopathic) with ulcer of L97.313 Non-pressure chronic ulcer of  right ankle with necrosi E66.01 Morbid (severe) obesity due  to excess calories Modifier: right lower s of muscle Quantity: 1 extremity CPT4 Code Description: 1884166 06301 -  WC PHYS SUBQ TISS 20 SQ CM THELBERT, GARTIN ([id number]) Modifier: Quantity: 1 Electronic Signature(s) Signed: 01/15/2015 11:19:46 AM By: Evlyn Kanner MD, FACS Previous Signature: 01/15/2015 11:18:42 AM Version By: Evlyn Kanner MD, FACS Entered By: Evlyn Kanner on 01/15/2015 11:19:46

## 2015-01-22 ENCOUNTER — Encounter: Payer: Medicare Other | Admitting: Surgery

## 2015-01-22 DIAGNOSIS — L97313 Non-pressure chronic ulcer of right ankle with necrosis of muscle: Secondary | ICD-10-CM | POA: Diagnosis not present

## 2015-01-22 NOTE — Progress Notes (Addendum)
[name], [name] ([id number]) Visit Report for 01/22/2015 Chief Complaint Document Details Patient Name: [name], [name] Date of Service: 01/22/2015 10:15 AM Medical Record Number: [id number] Patient Account Number: [id number] Date of Birth/Sex: [birth year] (79 y.o. Male) Treating RN: Primary Care Physician: Dorothey Baseman Other Clinician: Referring Physician: Terance Hart, DAVID Treating Physician/Extender: Rudene Re in Treatment: 1 Information Obtained from: Patient Chief Complaint Patient presents for treatment of an open ulcer due to venous insufficiency. The patient has had a open wound on his right medial ankle for at least 3 years and was last seen in the wound clinic in February 2015. He was lost to follow-up after that. Electronic Signature(s) Signed: 01/22/2015 10:55:52 AM By: Evlyn Kanner MD, FACS Entered By: Evlyn Kanner on 01/22/2015 10:55:52 [name], [name] ([id number]) -------------------------------------------------------------------------------- Debridement Details Patient Name: ENGELBERT, BENCOSME. Date of Service: 01/22/2015 10:15 AM Medical Record Number: [id number] Patient Account Number: [id number] Date of Birth/Sex: 26-Apr-1936 (79 y.o. Male) Treating RN: Primary Care Physician: Terance Hart, DAVID Other Clinician: Referring Physician: Terance Hart, DAVID Treating Physician/Extender: Rudene Re in Treatment: 1 Debridement Performed for Wound #5 Right,Medial Lower Leg Assessment: Performed By: Physician Tristan Schroeder., MD Debridement: Debridement Pre-procedure Yes Verification/Time Out Taken: Start Time: 10:45 Pain Control: Lidocaine 5% topical ointment Level: Skin/Subcutaneous Tissue Total Area Debrided (L x 14 (cm) x 8 (cm) = 112 (cm) W): Tissue and other Viable, Non-Viable, Eschar, Exudate, Fibrin/Slough, Subcutaneous material debrided: Instrument: Curette, Other : saline gauze Bleeding: Minimum Hemostasis Achieved:  Pressure End Time: 10:55 Procedural Pain: 0 Post Procedural Pain: 0 Response to Treatment: Procedure was tolerated well Post Debridement Measurements of Total Wound Length: (cm) 14.4 Width: (cm) 8.6 Depth: (cm) 0.4 Volume: (cm) 38.905 Electronic Signature(s) Signed: 01/22/2015 10:55:44 AM By: Evlyn Kanner MD, FACS Entered By: Evlyn Kanner on 01/22/2015 10:55:44 [name] ([id number]) -------------------------------------------------------------------------------- HPI Details Patient Name: [name] Date of Service: 01/22/2015 10:15 AM Medical Record Number: [id number] Patient Account Number: [id number] Date of Birth/Sex: [birth year] (79 y.o. Male) Treating RN: Primary Care Physician: Dorothey Baseman Other Clinician: Referring Physician: Terance Hart, DAVID Treating Physician/Extender: Rudene Re in Treatment: 1 History of Present Illness Location: right lower extremity ulceration Quality: Patient reports experiencing a dull pain to affected area(s). Severity: Patient states wound are getting worse. 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 Modifying Factors: Consults to this date include:has received treatment in the wound center in the past but stopped coming here since February 2015 Associated Signs and Symptoms: Patient reports having difficulty standing for long periods. HPI Description: is an 79 year old male who is known to have progressive weakness and has been in a nursing home for a while has had chronic lower extremity edema both legs and a large open wound on the right lower extremity which is has at least for about 3-4 years. The patient was seen in the  wound clinic before and has been treated until February 2015 when he was lost to follow-up. Past medical cyst history significant for hypertension, gout, venous stasis ulcers, Parkinson's [name], constipation. He's not been a diabetic but his last hemoglobin A1c was 6 in March 2015. There is documentation that he's had endovenous ablation of bilateral varicose  veins but we have no documentation about this and this may be done at least 4-5 years ago. 01/22/2015 -- he has his vascular test is scheduled for this afternoon. Electronic Signature(s) Signed: 01/22/2015 10:56:11 AM By: Evlyn Kanner MD, FACS Entered By: Evlyn Kanner on 01/22/2015 10:56:11 [name], [name] ([id number]) -------------------------------------------------------------------------------- Physical Exam Details Patient Name: [name], [name] Date of Service: 01/22/2015 10:15 AM Medical Record Number: [id number] Patient Account Number: [id number] Date of Birth/Sex: [birth year] (79 y.o. Male) Treating RN: Primary Care Physician: Dorothey Baseman Other Clinician: Referring Physician: Terance Hart, DAVID Treating Physician/Extender: Rudene Re in Treatment: 1 Constitutional . Pulse regular. Respirations normal and unlabored. Afebrile. . Eyes Nonicteric. Reactive to light. Ears, Nose, Mouth, and Throat Lips, teeth, and gums WNL.Marland Kitchen Moist mucosa without lesions . Neck supple and nontender. No palpable supraclavicular or cervical adenopathy. Normal sized without goiter. Respiratory WNL. No retractions.. Cardiovascular Pedal Pulses WNL. No clubbing, cyanosis or edema. Musculoskeletal Adexa without tenderness or enlargement.. Digits and nails w/o clubbing, cyanosis, infection, petechiae, ischemia, or inflammatory conditions.. Integumentary (Hair, Skin) No suspicious lesions. No crepitus or fluctuance. No peri-wound warmth or erythema. No masses.Marland Kitchen Psychiatric Judgement and insight Intact.. No evidence of  depression, anxiety, or agitation.. Notes H e has got a huge ulcer on the right lower extremity medial part which is covered with thick layer of slough. This will need sharp debridement today. Electronic Signature(s) Signed: 01/22/2015 10:57:09 AM By: Evlyn Kanner MD, FACS Entered By: Evlyn Kanner on 01/22/2015 10:57:08 [name], [name] ([id number]) -------------------------------------------------------------------------------- Physician Orders Details Patient Name: KARLIN, HEILMAN. Date of Service: 01/22/2015 10:15 AM Medical Record Number: [id number] Patient Account Number: [id number] Date of Birth/Sex: [birth year] (79 y.o. Male) Treating RN: Huel Coventry Primary Care Physician: Terance Hart, DAVID Other Clinician: Referring Physician: Terance Hart, DAVID Treating Physician/Extender: Rudene Re in Treatment: 1 Verbal / Phone Orders: Yes Clinician: Huel Coventry Read Back and Verified: Yes Diagnosis Coding Wound Cleansing Wound #5 Right,Medial Lower Leg o Cleanse wound with mild soap and water Anesthetic Wound #5 Right,Medial Lower Leg o Topical Lidocaine 4% cream applied to wound bed prior to debridement Skin Barriers/Peri-Wound Care Wound #5 Right,Medial Lower Leg o Barrier cream o Moisturizing lotion Primary Wound Dressing Wound #5 Right,Medial Lower Leg o Aquacel Ag Secondary Dressing Wound #5 Right,Medial Lower Leg o Gauze, ABD and Kerlix/Conform o XtraSorb Dressing Change Frequency Wound #5 Right,Medial Lower Leg o Change dressing every week Follow-up Appointments Wound #5 Right,Medial Lower Leg o Return Appointment in 1 week. Edema Control Wound #5 Right,Medial Lower Leg o Unna Boot to Right Lower Extremity - To be applied by [facility] after vascular appointment today. [name], [name] ([id number]) Consults o Podiatry - Patient needs consult with podiatry to have toe nails assessed. oooo Psychologist, prison and probation services) Signed: 01/22/2015 12:11:50  PM By: Evlyn Kanner MD, FACS Signed: 01/22/2015 2:14:46 PM By: Elliot Gurney RN, BSN, Kim RN, BSN Entered By: Elliot Gurney, RN, BSN, Kim on 01/22/2015 11:00:01 [name], [name] ([id number]) -------------------------------------------------------------------------------- Problem List Details Patient Name: [name], [name] Date of Service: 01/22/2015 10:15 AM Medical Record Number: [id number] Patient Account Number: [id number] Date of Birth/Sex: May 13, 1936 (79 y.o. 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 Date of Service: 01/22/2015 10:15 AM Medical Record Number: [id number] Patient Account Number: [id number] Date of Birth/Sex: 1936/01/31 (79 y.o. Male) Treating RN: Primary Care Physician: Dorothey Baseman Other Clinician: Referring Physician: Terance Hart, DAVID Treating Physician/Extender: Rudene Re in Treatment: 1 Subjective Chief Complaint Information obtained from Patient Patient presents for treatment of an open ulcer due to venous insufficiency. The patient has had a open wound on his right medial ankle for at least 3 years and was last seen in the wound clinic in February 2015. He was  lost to follow-up after that. History of Present Illness (HPI) The following HPI elements were documented for the patient's wound: Location: right lower extremity ulceration Quality: Patient reports experiencing a dull pain to affected area(s). Severity: Patient states wound are getting worse. 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 Modifying Factors: Consults to this date include:has received treatment in the wound center in the past but stopped coming here since February 2015 Associated Signs and Symptoms: Patient reports having difficulty standing for long periods. is an 79 year old male who is known to have progressive weakness and has been in a nursing home for a while has had chronic lower extremity edema both legs and a large open wound on the right lower extremity which is has at least for about 3-4 years. The patient was seen in the wound clinic before and has been treated until February 2015 when he was lost to follow-up. Past medical cyst history significant for hypertension, gout, venous stasis ulcers, Parkinson's [name], constipation. He's not been a diabetic but his last hemoglobin A1c was 6 in March 2015. There is documentation that he's had endovenous ablation of bilateral varicose veins but we have no documentation about this and this may be done at least 4-5 years ago. 01/22/2015 -- he has his vascular test is scheduled for this afternoon. Objective Constitutional Pulse regular. Respirations normal and unlabored. Afebrile. [name], [name] ([id number]) Vitals Time Taken: 10:23 AM, Height: 68 in, Temperature: 97.8 F, Pulse: 64 bpm, Respiratory Rate: 22 breaths/min, Blood  Pressure: 153/72 mmHg. Eyes Nonicteric. Reactive to light. Ears, Nose, Mouth, and Throat Lips, teeth, and gums WNL.Marland Kitchen Moist mucosa without lesions . Neck supple and nontender. No palpable supraclavicular or cervical adenopathy. Normal sized without goiter. Respiratory WNL. No retractions.. Cardiovascular Pedal Pulses WNL. No clubbing, cyanosis or edema. Musculoskeletal Adexa without tenderness or enlargement.. Digits and nails w/o clubbing, cyanosis, infection, petechiae, ischemia, or inflammatory conditions.Marland Kitchen Psychiatric Judgement and insight Intact.. No evidence of depression, anxiety, or agitation.. General Notes: H e has got a huge ulcer on the right lower extremity medial part which is covered with thick layer of slough. This will need sharp debridement today. Integumentary (Hair, Skin) No suspicious lesions. No crepitus or fluctuance. No peri-wound warmth or erythema. No masses.. Wound #5 status is Open. Original cause of wound was Gradually Appeared. The wound is located on the Right,Medial Lower Leg. The wound measures 14.4cm length x 8.6cm width x 0.4cm depth; 97.264cm^2  area and 38.905cm^3 volume. The wound is limited to skin breakdown. There is no tunneling or undermining noted. There is a large amount of serosanguineous drainage noted. The wound margin is distinct with the outline attached to the wound base. There is medium (34-66%) pink, pale granulation within the wound bed. There is a small (1-33%) amount of necrotic tissue within the wound bed including Adherent Slough. The periwound skin appearance exhibited: Localized Edema, Moist, Mottled. The periwound skin appearance did not exhibit: Callus, Crepitus, Excoriation, Fluctuance, Friable, Induration, Rash, Scarring, Dry/Scaly, Maceration, Atrophie Blanche, Cyanosis, Ecchymosis, Hemosiderin Staining, Pallor, Rubor, Erythema. Periwound temperature was noted as No Abnormality. 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 He is already on Augmentin and we will continue with this. We will continue silver alginate application today and a light bandage as he is going for his vascular venous duplex study this afternoon. The nurses there will applies "after he is done with his vascular study. We'll see him back next week. Procedures Wound #5 Wound #5 is a Venous Leg Ulcer located on the Right,Medial Lower Leg . There was a Skin/Subcutaneous Tissue Debridement ([id number]) debridement with total area of 112 sq cm performed by Ramatoulaye Pack, Ignacia Felling., MD. with the following instrument(s): Curette and saline gauze to remove Viable and Non-Viable tissue/material including Exudate, Fibrin/Slough, Eschar, and Subcutaneous after achieving pain control using Lidocaine 5% topical ointment. A time out was conducted prior to the start of the procedure. A Minimum amount of bleeding was controlled with Pressure. The procedure was tolerated well with a pain level of 0 throughout and a pain level of 0 following the procedure. Post Debridement Measurements: 14.4cm length x 8.6cm width x 0.4cm depth; 38.905cm^3 volume. Plan Wound Cleansing: Wound #5 Right,Medial Lower Leg: Cleanse wound with mild soap and water Anesthetic: Wound #5 Right,Medial Lower Leg: Topical Lidocaine 4% cream applied to wound bed prior to debridement Skin Barriers/Peri-Wound Care: Wound #5 Right,Medial Lower Leg: Barrier cream Moisturizing lotion Primary Wound Dressing: Wound #5 Right,Medial Lower Leg: [name], SWIDERSKI ([id number]) Aquacel Ag Secondary Dressing: Wound #5 Right,Medial  Lower Leg: Gauze, ABD and Kerlix/Conform XtraSorb Dressing Change Frequency: Wound #5 Right,Medial Lower Leg: Change dressing every week Follow-up Appointments: Wound #5 Right,Medial Lower Leg: Return Appointment in 1 week. Edema Control: Wound #5 Right,Medial Lower Leg: Unna Boot to Right Lower Extremity - To be applied by [facility] after vascular appointment today. Consults ordered were: Podiatry - Patient needs consult with podiatry to have toe nails assessed. The patient's wound has quite a bit of debris and once removed the base the ulcer is in the muscular plane of his leg. He is already on Augmentin and we will continue with this. We will continue silver alginate application today and a light bandage as he is going for his vascular venous duplex study this afternoon. The nurses there will applies "after he is done with his vascular study. We'll see him back next week. Electronic Signature(s) Signed: 01/22/2015 11:03:19 AM By: Evlyn Kanner MD, FACS Entered By: Evlyn Kanner on 01/22/2015 11:03:19 EWELL, BENASSI ([id number]) -------------------------------------------------------------------------------- SuperBill Details Patient Name: JAHAAD, PENADO. Date of Service: 01/22/2015 Medical Record Number: [id number] Patient Account  Number: [id number] Date of Birth/Sex: [birth year] (79 y.o. 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 E66.01 Morbid (severe) obesity due to excess calories Facility Procedures CPT4 Code Description: [id number] 11042 - DEB SUBQ TISSUE 20 SQ CM/< ICD-10 Description Diagnosis I87.311 Chronic venous hypertension  (idiopathic) with ulcer of 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 Modifier: right lowe s of muscle Quantity: 1 r extremity CPT4 Code Description: [id number] 11045 - DEB SUBQ TISS EA ADDL 20CM ICD-10 Description Diagnosis I87.311 Chronic venous hypertension (idiopathic) with ulcer of 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 Modifier: right lowe s of muscle Quantity: 5 r extremity Physician Procedures CPT4 Code Description: [id number] 11042 - WC PHYS SUBQ TISS 20 SQ CM ICD-10 Description Diagnosis I87.311 Chronic venous hypertension (idiopathic) with ulcer of 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

## 2015-01-23 NOTE — Progress Notes (Signed)
DALLIS, CZAJA ([id number]) Visit Report for 01/22/2015 Arrival Information Details Patient Name: [name], [name] Date of Service: 01/22/2015 10:15 AM Medical Record Number: [id number] Patient Account Number: [id number] Date of Birth/Sex: 1936-05-12 (79 y.o. Male) Treating RN: Curtis Sites Primary Care Physician: Dorothey Baseman Other Clinician: Referring Physician: Terance Hart, DAVID Treating Physician/Extender: Rudene Re in Treatment: 1 Visit Information History Since Last Visit Added or deleted any medications: No Patient Arrived: Wheel Chair Any new allergies or adverse reactions: No Arrival Time: 10:18 Had a fall or experienced change in No activities of daily living that may affect Accompanied By: self risk of falls: Transfer Assistance: Manual Signs or symptoms of abuse/neglect since last No Patient Identification Verified: Yes visito Secondary Verification Process Yes Hospitalized since last visit: No Completed: Pain Present Now: No Patient Requires Transmission-Based No Precautions: Patient Has Alerts: No Electronic Signature(s) Signed: 01/22/2015 11:16:50 AM By: Curtis Sites Entered By: Curtis Sites on 01/22/2015 11:16:50 [name] ([id number]) -------------------------------------------------------------------------------- Encounter Discharge Information Details Patient Name: [name] Date of Service: 01/22/2015 10:15 AM Medical Record Number: [id number] Patient Account Number: [id number] Date of Birth/Sex: March 21, 1936 (79 y.o. Male) Treating RN: Curtis Sites Primary Care Physician: Dorothey Baseman Other Clinician: Referring Physician: Dorothey Baseman Treating Physician/Extender: Rudene Re in Treatment: 1 Encounter Discharge Information Items Discharge Pain Level: 0 Discharge Condition: Stable Ambulatory Status: Wheelchair Nursing Discharge Destination: Home Transportation: Private Auto Accompanied By:  self Schedule Follow-up Appointment: Yes Medication Reconciliation completed No and provided to Patient/Care Abdelrahman Nair: Clinical Summary of Care: Electronic Signature(s) Signed: 01/22/2015 11:16:11 AM By: Curtis Sites Entered By: Curtis Sites on 01/22/2015 11:16:11 [name] ([id number]) -------------------------------------------------------------------------------- Lower Extremity Assessment Details Patient Name: [name] Date of Service: 01/22/2015 10:15 AM Medical Record Number: [id number] Patient Account Number: [id number] Date of Birth/Sex: 12/14/1935 (79 y.o. Male) Treating RN: Curtis Sites Primary Care Physician: Terance Hart, DAVID Other Clinician: Referring Physician: Terance Hart, DAVID Treating Physician/Extender: Rudene Re in Treatment: 1 Edema Assessment Assessed: [Left: No] [Right: No] Edema: [Left: Ye] [Right: s] Calf Left: Right: Point of Measurement: 40 cm From Medial Instep cm 41.8 cm Ankle Left: Right: Point of Measurement: 10 cm From Medial Instep cm 27.7 cm Vascular Assessment Pulses: Posterior Tibial Dorsalis Pedis Palpable: [Right:Yes] Extremity colors, hair growth, and conditions: Extremity Color: [Right:Hyperpigmented] Hair Growth on Extremity: [Right:No] Temperature of Extremity: [Right:Warm] Capillary Refill: [Right:< 3 seconds] Toe Nail Assessment Left: Right: Thick: Yes Discolored: Yes Deformed: Yes Improper Length and Hygiene: Yes Electronic Signature(s) Signed: 01/22/2015 4:30:29 PM By: Curtis Sites Entered By: Curtis Sites on 01/22/2015 10:41:07 [name] ([id number]) -------------------------------------------------------------------------------- Multi Wound Chart Details Patient Name: [name] Date of Service: 01/22/2015 10:15 AM Medical Record Number: [id number] Patient Account Number: [id number] Date of Birth/Sex: [birth year] (79 y.o. Male) Treating RN: Huel Coventry Primary Care  Physician: Dorothey Baseman Other Clinician: Referring Physician: Terance Hart, DAVID Treating Physician/Extender: Rudene Re in Treatment: 1 Vital Signs Height(in): 68 Pulse(bpm): 64 Weight(lbs): Blood Pressure 153/72 (mmHg): Body Mass Index(BMI): Temperature(F): 97.8 Respiratory Rate 22 (breaths/min): Photos: [5:No Photos] [N/A:N/A] Wound Location: [5:Right Lower Leg - Medial] [N/A:N/A] Wounding Event: [5:Gradually Appeared] [N/A:N/A] Primary Etiology: [5:Venous Leg Ulcer] [N/A:N/A] Comorbid History: [5:Cataracts, Anemia, Hypertension, Osteoarthritis] [N/A:N/A] Date Acquired: [5:11/13/2014] [N/A:N/A] Weeks of Treatment: [5:1] [N/A:N/A] Wound Status: [5:Open] [N/A:N/A] Measurements L x W x D 14.4x8.6x0.4 [N/A:N/A] (cm) Area (cm) : [5:97.264] [N/A:N/A] Volume (cm) : [5:38.905] [N/A:N/A] % Reduction in Area: [5:5.10%] [N/A:N/A] % Reduction in Volume: -26.50% [N/A:N/A] Classification: [5:Full Thickness Without Exposed Support Structures] [  N/A:N/A] Exudate Amount: [5:Large] [N/A:N/A] Exudate Type: [5:Serosanguineous] [N/A:N/A] Exudate Color: [5:red, brown] [N/A:N/A] Foul Odor After [5:Yes] [N/A:N/A] Cleansing: Odor Anticipated Due to No [N/A:N/A] Product Use: Wound Margin: [5:Distinct, outline attached] [N/A:N/A] Granulation Amount: [5:Medium (34-66%)] [N/A:N/A] Granulation Quality: [5:Pink, Pale] [N/A:N/A] Necrotic Amount: [5:Small (1-33%)] [N/A:N/A] Exposed Structures: Fascia: No N/A N/A Fat: No Tendon: No Muscle: No Joint: No Bone: No Limited to Skin Breakdown Epithelialization: None N/A N/A Periwound Skin Texture: Edema: Yes N/A N/A Excoriation: No Induration: No Callus: No Crepitus: No Fluctuance: No Friable: No Rash: No Scarring: No Periwound Skin Moist: Yes N/A N/A Moisture: Maceration: No Dry/Scaly: No Periwound Skin Color: Mottled: Yes N/A N/A Atrophie Blanche: No Cyanosis: No Ecchymosis: No Erythema: No Hemosiderin Staining:  No Pallor: No Rubor: No Temperature: No Abnormality N/A N/A Tenderness on No N/A N/A Palpation: Wound Preparation: Ulcer Cleansing: Other: N/A N/A soap and water Topical Anesthetic Applied: Other: lidocaine 4% Treatment Notes Electronic Signature(s) Signed: 01/22/2015 2:14:46 PM By: Elliot Gurney, RN, BSN, Kim RN, BSN Entered By: Elliot Gurney, RN, BSN, Kim on 01/22/2015 10:48:40 [place], Dresser Chrissie Noa ([id number]) -------------------------------------------------------------------------------- Multi-Disciplinary Care Plan Details Patient Name: [name], [name] Date of Service: 01/22/2015 10:15 AM Medical Record Number: [id number] Patient Account Number: [id number] Date of Birth/Sex: January 28, 1936 (79 y.o. Male) Treating RN: Huel Coventry Primary Care Physician: Dorothey Baseman Other Clinician: Referring Physician: Dorothey Baseman Treating Physician/Extender: Rudene Re in Treatment: 1 Active Inactive Orientation to the Wound Care Program Nursing Diagnoses: Knowledge deficit related to the wound healing center program Goals: Patient/caregiver will verbalize understanding of the Wound Healing Center Program Date Initiated: 01/15/2015 Goal Status: Active Interventions: Provide education on orientation to the wound center Notes: Venous Leg Ulcer Nursing Diagnoses: Knowledge deficit related to disease process and management Potential for venous Insuffiency (use before diagnosis confirmed) Goals: Patient will maintain optimal edema control Date Initiated: 01/15/2015 Goal Status: Active Patient/caregiver will verbalize understanding of disease process and disease management Date Initiated: 01/15/2015 Goal Status: Active Verify adequate tissue perfusion prior to therapeutic compression application Date Initiated: 01/15/2015 Goal Status: Active Interventions: Assess peripheral edema status every visit. Compression as ordered Provide education on venous insufficiency TERRIUS, GENTILE  ([id number]) Treatment Activities: Test ordered outside of clinic : 01/22/2015 Therapeutic compression applied : 01/22/2015 Venous Duplex Doppler : 01/22/2015 Notes: Wound/Skin Impairment Nursing Diagnoses: Impaired tissue integrity Knowledge deficit related to ulceration/compromised skin integrity Goals: Patient/caregiver will verbalize understanding of skin care regimen Date Initiated: 01/15/2015 Goal Status: Active Ulcer/skin breakdown will have a volume reduction of 30% by week 4 Date Initiated: 01/15/2015 Goal Status: Active Ulcer/skin breakdown will have a volume reduction of 50% by week 8 Date Initiated: 01/15/2015 Goal Status: Active Ulcer/skin breakdown will have a volume reduction of 80% by week 12 Date Initiated: 01/15/2015 Goal Status: Active Ulcer/skin breakdown will heal within 14 weeks Date Initiated: 01/15/2015 Goal Status: Active Interventions: Assess patient/caregiver ability to perform ulcer/skin care regimen upon admission and as needed Assess ulceration(s) every visit Provide education on ulcer and skin care Notes: Electronic Signature(s) Signed: 01/22/2015 2:14:46 PM By: Elliot Gurney, RN, BSN, Kim RN, BSN Entered By: Elliot Gurney, RN, BSN, Kim on 01/22/2015 10:48:24 [name] ([id number]) -------------------------------------------------------------------------------- Patient/Caregiver Education Details Patient Name: ALEXIZ, SUSTAITA. Date of Service: 01/22/2015 10:15 AM Medical Record Number: [id number] Patient Account Number: [id number] Date of Birth/Gender: June 27, 1936 (79 y.o. Male) Treating RN: Curtis Sites Primary Care Physician: Dorothey Baseman Other Clinician: Referring Physician: Dorothey Baseman Treating Physician/Extender: Rudene Re in Treatment: 1 Education Assessment Education Provided To: Patient Education Topics  Provided Wound/Skin Impairment: Handouts: Other: take packet with you to [facility] and have them call us with any  questions Methods: Explain/Verbal Responses: State content correctly Electronic Signature(s) Signed: 01/22/2015 11:16:42 AM By: Curtis Sites Entered By: Curtis Sites on 01/22/2015 11:16:42 Cai, Anfinson Chrissie Noa ([id number]) -------------------------------------------------------------------------------- Wound Assessment Details Patient Name: [name] Date of Service: 01/22/2015 10:15 AM Medical Record Number: [id number] Patient Account Number: [id number] Date of Birth/Sex: 1936-07-01 (79 y.o. Male) Treating RN: Curtis Sites Primary Care Physician: Terance Hart, DAVID Other Clinician: Referring Physician: Terance Hart, DAVID Treating Physician/Extender: Rudene Re in Treatment: 1 Wound Status Wound Number: 5 Primary Venous Leg Ulcer Etiology: Wound Location: Right Lower Leg - Medial Wound Status: Open Wounding Event: Gradually Appeared Comorbid Cataracts, Anemia, Hypertension, Date Acquired: 11/13/2014 History: Osteoarthritis Weeks Of Treatment: 1 Clustered Wound: No Photos Photo Uploaded By: Curtis Sites on 01/22/2015 12:24:03 Wound Measurements Length: (cm) 14.4 Width: (cm) 8.6 Depth: (cm) 0.4 Area: (cm) 97.264 Volume: (cm) 38.905 % Reduction in Area: 5.1% % Reduction in Volume: -26.5% Epithelialization: None Tunneling: No Undermining: No Wound Description Full Thickness Without Exposed Classification: Support Structures Wound Margin: Distinct, outline attached Exudate Large Amount: Exudate Type: Serosanguineous Exudate Color: red, brown Foul Odor After Cleansing: Yes Due to Product Use: No Wound Bed Granulation Amount: Medium (34-66%) Exposed Structure Granulation Quality: Pink, Pale Fascia Exposed: No Necrotic Amount: Small (1-33%) Fat Layer Exposed: No CEDERIC, MOZLEY ([id number]) Necrotic Quality: Adherent Slough Tendon Exposed: No Muscle Exposed: No Joint Exposed: No Bone Exposed: No Limited to Skin Breakdown Periwound Skin  Texture Texture Color No Abnormalities Noted: No No Abnormalities Noted: No Callus: No Atrophie Blanche: No Crepitus: No Cyanosis: No Excoriation: No Ecchymosis: No Fluctuance: No Erythema: No Friable: No Hemosiderin Staining: No Induration: No Mottled: Yes Localized Edema: Yes Pallor: No Rash: No Rubor: No Scarring: No Temperature / Pain Moisture Temperature: No Abnormality No Abnormalities Noted: No Dry / Scaly: No Maceration: No Moist: Yes Wound Preparation Ulcer Cleansing: Other: soap and water, Topical Anesthetic Applied: Other: lidocaine 4%, Treatment Notes Wound #5 (Right, Medial Lower Leg) 1. Cleansed with: Cleanse wound with antibacterial soap and water 2. Anesthetic Topical Lidocaine 4% cream to wound bed prior to debridement 3. Peri-wound Care: Barrier cream 4. Dressing Applied: Aquacel Ag 5. Secondary Dressing Applied Bordered Foam Dressing Electronic Signature(s) Signed: 01/22/2015 4:30:29 PM By: Curtis Sites Entered By: Curtis Sites on 01/22/2015 10:38:55 AGOSTINO, GORIN ([id number]) -------------------------------------------------------------------------------- Vitals Details Patient Name: [name] Date of Service: 01/22/2015 10:15 AM Medical Record Number: [id number] Patient Account Number: [id number] Date of Birth/Sex: October 16, 1935 (79 y.o. Male) Treating RN: Curtis Sites Primary Care Physician: Terance Hart, DAVID Other Clinician: Referring Physician: Terance Hart, DAVID Treating Physician/Extender: Rudene Re in Treatment: 1 Vital Signs Time Taken: 10:23 Temperature (F): 97.8 Height (in): 68 Pulse (bpm): 64 Respiratory Rate (breaths/min): 22 Blood Pressure (mmHg): 153/72 Reference Range: 80 - 120 mg / dl Electronic Signature(s) Signed: 01/22/2015 4:30:29 PM By: Curtis Sites Entered By: Curtis Sites on 01/22/2015 10:24:33

## 2015-01-29 ENCOUNTER — Encounter: Payer: Medicare Other | Admitting: Surgery

## 2015-01-29 DIAGNOSIS — L97313 Non-pressure chronic ulcer of right ankle with necrosis of muscle: Secondary | ICD-10-CM | POA: Diagnosis not present

## 2015-01-29 NOTE — Progress Notes (Addendum)
KOHEN, REITHER ([id number]) Visit Report for 01/29/2015 Chief Complaint Document Details Patient Name: BENTLIE, CATANZARO. Date of Service: 01/29/2015 2:45 PM Medical Record Number: [id number] Patient Account Number: [id number] Date of Birth/Sex: 1935/10/22 (79 y.o. Male) Treating RN: Primary Care Physician: Dorothey Baseman Other Clinician: Referring Physician: Terance Hart, DAVID Treating Physician/Extender: Rudene Re in Treatment: 2 Information Obtained from: Patient Chief Complaint Patient presents for treatment of an open ulcer due to venous insufficiency. The patient has had a open wound on his right medial ankle for at least 3 years and was last seen in the wound clinic in February 2015. He was lost to follow-up after that. Electronic Signature(s) Signed: 01/29/2015 3:43:26 PM By: Evlyn Kanner MD, FACS Previous Signature: 01/29/2015 2:27:40 PM Version By: Evlyn Kanner MD, FACS Entered By: Evlyn Kanner on 01/29/2015 15:43:26 Quante, Pettry Chrissie Noa ([id number]) -------------------------------------------------------------------------------- Debridement Details Patient Name: GUIDO, COMP. Date of Service: 01/29/2015 2:45 PM Medical Record Number: [id number] Patient Account Number: [id number] Date of Birth/Sex: July 23, 1935 (79 y.o. Male) Treating RN: Primary Care Physician: Terance Hart, DAVID Other Clinician: Referring Physician: Terance Hart, DAVID Treating Physician/Extender: Rudene Re in Treatment: 2 Debridement Performed for Wound #5 Right,Medial Lower Leg Assessment: Performed By: Physician Tristan Schroeder., MD Debridement: Debridement Pre-procedure Yes Verification/Time Out Taken: Start Time: 03:30 Pain Control: Lidocaine 5% topical ointment Level: Skin/Subcutaneous Tissue Total Area Debrided (L x 8 (cm) x 4 (cm) = 32 (cm) W): Tissue and other Viable, Non-Viable, Eschar, Fibrin/Slough, Subcutaneous material debrided: Instrument: Other : saline  gauze Bleeding: Minimum Hemostasis Achieved: Pressure End Time: 03:35 Procedural Pain: 0 Post Procedural Pain: 0 Response to Treatment: Procedure was tolerated well Post Debridement Measurements of Total Wound Length: (cm) 14.4 Width: (cm) 8.6 Depth: (cm) 0.4 Volume: (cm) 38.905 Electronic Signature(s) Signed: 01/29/2015 3:52:01 PM By: Evlyn Kanner MD, FACS Previous Signature: 01/29/2015 3:50:49 PM Version By: Evlyn Kanner MD, FACS Entered By: Evlyn Kanner on 01/29/2015 15:52:00 HADYN, BLANCK ([id number]) -------------------------------------------------------------------------------- HPI Details Patient Name: [name]. Date of Service: 01/29/2015 2:45 PM Medical Record Number: [id number] Patient Account Number: [id number] Date of Birth/Sex: 23-May-1936 (79 y.o. Male) Treating RN: Primary Care Physician: Dorothey Baseman Other Clinician: Referring Physician: Terance Hart, DAVID Treating Physician/Extender: Rudene Re in Treatment: 2 History of Present Illness Location: right lower extremity ulceration Quality: Patient reports experiencing a dull pain to affected area(s). Severity: Patient states wound are getting worse. 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 Modifying Factors: Consults to this date include:has received treatment in the wound center in the past but stopped coming here since February 2015 Associated Signs and Symptoms: Patient reports having difficulty standing for long periods. HPI Description: 79 year old male who is known to have progressive weakness and has been in a nursing home for a while has had chronic lower extremity edema both legs and a large  open wound on the right lower extremity which is has at least for about 3-4 years. The patient was seen in the wound clinic before and has been treated until February 2015 when he was lost to follow-up. Past medical history is significant for hypertension, gout, venous stasis ulcers, Parkinson's [name], constipation. He's not been a diabetic but his last  hemoglobin A1c was 6 in March 2015. There is documentation that he's had endovenous ablation of bilateral varicose veins but we have no documentation about this and this may be done at least 4-5 years ago. 01/22/2015 -- he has his vascular test is scheduled for this afternoon. 01/29/2015 -- the patient's vascular test was canceled last week because he was unable to get onto the examining bed at [facility]. His Unna's boot was not applied either and the patient had a dressing placed by home health. Today when his dressing was removed we found a lot of maggots in his right lower extremity. Electronic Signature(s) Signed: 01/29/2015 3:45:07 PM By: Evlyn Kanner MD, FACS Previous Signature: 01/29/2015 2:56:11 PM Version By: Evlyn Kanner MD, FACS Previous Signature: 01/29/2015 2:27:56 PM Version By: Evlyn Kanner MD, FACS Entered By: Evlyn Kanner on 01/29/2015 15:45:07 KERWIN, AUGUSTUS ([id number]) -------------------------------------------------------------------------------- Physical Exam Details Patient Name: LEWIN, PELLOW. Date of Service: 01/29/2015 2:45 PM Medical Record Number: [id number] Patient Account Number: [id number] Date of Birth/Sex: 07-03-1936 (79 y.o. Male) Treating RN: Primary Care Physician: Dorothey Baseman Other Clinician: Referring Physician: Terance Hart, DAVID Treating Physician/Extender: Rudene Re in Treatment: 2 Constitutional . Pulse regular. Respirations normal and unlabored. Afebrile. . Eyes Nonicteric. Reactive to light. Ears, Nose, Mouth, and Throat Lips, teeth, and gums WNL.Marland Kitchen Moist mucosa  without lesions . Neck supple and nontender. No palpable supraclavicular or cervical adenopathy. Normal sized without goiter. Respiratory WNL. No retractions.. Cardiovascular Pedal Pulses WNL. No clubbing, cyanosis or edema. Chest Breasts symmetical and no nipple discharge.. Breast tissue WNL, no masses, lumps, or tenderness.. Musculoskeletal Adexa without tenderness or enlargement.. Digits and nails w/o clubbing, cyanosis, infection, petechiae, ischemia, or inflammatory conditions.. Integumentary (Hair, Skin) No suspicious lesions. No crepitus or fluctuance. No peri-wound warmth or erythema. No masses.Marland Kitchen Psychiatric Judgement and insight Intact.. No evidence of depression, anxiety, or agitation.. Notes The patient had a lot of maggots on his wound today and once these were removed I was able to debride a large amount of slough. Electronic Signature(s) Signed: 01/29/2015 3:47:06 PM By: Evlyn Kanner MD, FACS Entered By: Evlyn Kanner on 01/29/2015 15:47:05 LATEEF, JUNCAJ ([id number]) -------------------------------------------------------------------------------- Physician Orders Details Patient Name: JLEN, WINTLE. Date of Service: 01/29/2015 2:45 PM Medical Record Number: [id number] Patient Account Number: [id number] Date of Birth/Sex: Nov 24, 1935 (79 y.o. 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 E66.01 Morbid (severe) obesity due to excess calories Wound Cleansing Wound #5 Right,Medial Lower Leg o Cleanse wound with mild soap and  water Anesthetic Wound #5 Right,Medial Lower Leg o Topical Lidocaine 4% cream applied to wound bed prior to debridement Skin Barriers/Peri-Wound Care Wound #5 Right,Medial Lower Leg o Barrier cream o Moisturizing lotion Primary Wound Dressing Wound #5 Right,Medial Lower Leg o Aquacel Ag Secondary Dressing Wound #5 Right,Medial Lower Leg o Gauze, ABD and Kerlix/Conform o XtraSorb Dressing Change Frequency Wound #5 Right,Medial Lower Leg o Change dressing every week Follow-up Appointments Wound #5 Right,Medial Lower Leg GRAYLIN, SPERLING ([id number]) o Return Appointment in 1 week. Edema Control Wound #5 Right,Medial Lower Leg o Unna Boot to Right Lower Extremity Electronic Signature(s) Signed: 01/29/2015 3:54:23 PM By: Evlyn Kanner MD, FACS Signed: 01/29/2015  4:46:32 PM By: Curtis Sites Entered By: Curtis Sites on 01/29/2015 15:35:46 [name] ([id number]) -------------------------------------------------------------------------------- Problem List Details Patient Name: JARTAVIOUS, DAVYDOV. Date of Service: 01/29/2015 2:45 PM Medical Record Number: [id number] Patient Account Number: [id number] Date of Birth/Sex: Jul 15, 1935 (79 y.o. 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 Date of Service: 01/29/2015 2:45 PM Medical Record Number: [id number] Patient Account Number: [id number] Date of Birth/Sex: 02-May-1936 (79 y.o. Male) Treating RN: Primary Care Physician: Dorothey Baseman Other Clinician: Referring Physician: Terance Hart, DAVID Treating Physician/Extender: Rudene Re in Treatment: 2 Subjective Chief Complaint Information obtained from Patient Patient presents for treatment of an open ulcer due to venous insufficiency. The patient has had a open wound on his right medial ankle for at least 3 years and was last seen in the wound clinic in February 2015. He was lost to follow-up after that. History of Present Illness (HPI) The following HPI elements were documented for the patient's wound: Location: right lower extremity ulceration Quality: Patient reports experiencing a dull pain to affected area(s). Severity: Patient states wound are getting worse. 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 Modifying Factors: Consults to this date include:has received treatment in the wound center in the past but stopped coming here since February 2015 Associated Signs and Symptoms: Patient reports having difficulty standing for long periods. 79 year old male who is known to have progressive weakness and has been in a nursing home for a while has had chronic lower extremity edema both legs and a large open wound on the right lower extremity which is has at least for about 3-4 years. The  patient was seen in the wound clinic before and has been treated until February 2015 when he was lost to follow-up. Past medical history is significant for hypertension, gout, venous stasis ulcers, Parkinson's [name], constipation. He's not been a diabetic but his last hemoglobin A1c was 6 in March 2015. There is documentation that he's had endovenous ablation of bilateral varicose veins but we have no documentation about this and this may be done at least 4-5 years ago. 01/22/2015 -- he has his vascular test is scheduled for this afternoon. 01/29/2015 -- the patient's vascular test was canceled last week because he was unable to get onto the examining bed at [facility]. His Unna's boot was not applied either and the patient had a dressing placed by home health. Today when his dressing was removed we found a lot of maggots in his right lower extremity. Objective BASHEER, RAGUCCI ([id number]) Constitutional Pulse regular. Respirations normal and unlabored. Afebrile. Vitals Time  Taken: 2:58 PM, Height: 68 in, Temperature: 97.9 F, Pulse: 68 bpm, Respiratory Rate: 20 breaths/min, Blood Pressure: 154/74 mmHg. Eyes Nonicteric. Reactive to light. Ears, Nose, Mouth, and Throat Lips, teeth, and gums WNL.Marland Kitchen Moist mucosa without lesions . Neck supple and nontender. No palpable supraclavicular or cervical adenopathy. Normal sized without goiter. Respiratory WNL. No retractions.. Cardiovascular Pedal Pulses WNL. No clubbing, cyanosis or edema. Chest Breasts symmetical and no nipple discharge.. Breast tissue WNL, no masses, lumps, or tenderness.. Musculoskeletal Adexa without tenderness or enlargement.. Digits and nails w/o clubbing, cyanosis, infection, petechiae, ischemia, or inflammatory conditions.Marland Kitchen Psychiatric Judgement and insight Intact.. No evidence of depression, anxiety, or agitation.. General Notes: The patient had a lot of maggots on his wound today and once these were removed I  was able to debride a large amount of slough. Integumentary (Hair, Skin) No suspicious lesions. No crepitus or fluctuance. No peri-wound warmth or erythema. No masses.. Wound #5 status is Open. Original cause of wound was Gradually Appeared. The wound is located on the Right,Medial Lower Leg. The wound measures 14.4cm length x 8.6cm width x 0.4cm depth; 97.264cm^2 area and 38.905cm^3 volume. The wound is limited to skin breakdown. There is no tunneling or undermining noted. There is a large amount of serosanguineous drainage noted. The wound margin is distinct with the outline attached to the wound base. There is medium (34-66%) pink, pale granulation within the wound bed. There is a small (1-33%) amount of necrotic tissue within the wound bed including Adherent Slough. The periwound skin appearance exhibited: Localized Edema, Moist, Mottled. The periwound skin appearance did not exhibit: Callus, Crepitus, Excoriation, Fluctuance, Friable, Induration, Rash, Scarring, Dry/Scaly, Maceration, Atrophie Blanche, Cyanosis, Ecchymosis, Hemosiderin Staining, Pallor, Rubor, Erythema. Periwound temperature was noted as No Abnormality. General Notes: maggots found in patient's wound - see photo - removed and wound cleansed. SNF notified BETTIE, SWAVELY ([id number]) - I spoke with Lelon Mast about this and an order was faxed that states that if SNF nurse changes wrap, the nurse is to call [facility] to be instructed in proper unna wrap prior to wrapping patient's leg. 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 The patient will be rescheduled if necessary at [facility] and we will continue with silver alginate and an Unna's boot. Procedures Wound #5 Wound #5 is a Venous Leg Ulcer located on the Right,Medial Lower Leg . There was a Skin/Subcutaneous Tissue Debridement ([id number]) debridement with total area of 32 sq cm performed by Kordae Buonocore, Ignacia Felling., MD. with the following instrument(s): saline gauze to remove Viable and Non-Viable tissue/material including Fibrin/Slough, Eschar, and Subcutaneous after achieving pain control using Lidocaine 5% topical ointment. A time out was conducted prior to the start of the procedure. A Minimum amount of bleeding was controlled with Pressure. The procedure was tolerated well with a pain level of 0 throughout and a pain level of 0 following the procedure. Post Debridement Measurements: 14.4cm length x 8.6cm width x 0.4cm depth; 38.905cm^3 volume. Plan Wound Cleansing: Wound #5 Right,Medial Lower Leg: [place], CURRENT ([id number]) Cleanse wound  with mild soap and water Anesthetic: Wound #5 Right,Medial Lower Leg: Topical Lidocaine 4% cream applied to wound bed prior to debridement Skin Barriers/Peri-Wound Care: Wound #5 Right,Medial Lower Leg: Barrier cream Moisturizing lotion Primary Wound Dressing: Wound #5 Right,Medial Lower Leg: Aquacel Ag Secondary Dressing: Wound #5 Right,Medial Lower Leg: Gauze, ABD and Kerlix/Conform XtraSorb Dressing Change Frequency: Wound #5 Right,Medial Lower Leg: Change dressing every week Follow-up Appointments: Wound #5 Right,Medial Lower Leg: Return Appointment in 1 week. Edema Control: Wound #5 Right,Medial Lower Leg: Unna Boot to Right Lower Extremity I have asked our clinical nurse manager to call [facility] and find out why his appointment was canceled and why his dressing was not done as requested. The patient will be rescheduled if  necessary at [facility] and we will continue with silver alginate and an Unna's boot. Electronic Signature(s) Signed: 02/04/2015 1:39:52 PM By: Evlyn Kanner MD, FACS Previous Signature: 01/29/2015 3:55:41 PM Version By: Evlyn Kanner MD, FACS Previous Signature: 01/29/2015 3:51:09 PM Version By: Evlyn Kanner MD, FACS Previous Signature: 01/29/2015 3:48:49 PM Version By: Evlyn Kanner MD, FACS Entered By: Evlyn Kanner on 02/04/2015 13:39:52 Matheau, [place] Chrissie Noa ([id number]) -------------------------------------------------------------------------------- SuperBill Details Patient Name: CAYLOR, TALLARICO. Date of Service: 01/29/2015 Medical Record Number: [id number] Patient Account Number: [id number] Date of Birth/Sex: [birth year] (80 y.o. 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 E66.01 Morbid (severe) obesity due to excess calories Facility Procedures CPT4 Code Description: 78295621 11042 - DEB SUBQ TISSUE 20 SQ CM/< ICD-10 Description Diagnosis I87.311 Chronic venous hypertension (idiopathic) with ulcer of 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 Modifier: right lowe s of muscle Quantity: 1 r extremity CPT4 Code Description: 30865784 11045 - DEB SUBQ TISS EA ADDL 20CM ICD-10 Description Diagnosis I87.311 Chronic venous hypertension (idiopathic) with ulcer of 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 E66.01 Morbid (severe) obesity  due to excess calories Modifier: right lowe s of muscle Quantity: 1 r extremity Physician Procedures CPT4 Code Description: [id number] 84132 - WC PHYS LEVEL 3 - EST PT  ICD-10 Description Diagnosis I87.311 Chronic venous hypertension (idiopathic) with ulcer of 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 E66.01 Morbid (severe) obesity due  to excess calories Modifier: 25 right lower s of muscle Quantity: 1 extremity CPT4 Code Description: 4401027 11042 - WC PHYS SUBQ TISS 20 SQ CM ICD-10 Description Diagnosis I87.311 Chronic venous hypertension (idiopathic) with ulcer of CHETAN, MEHRING ([id number]) Modifier: right lower Quantity: 1 extremity Electronic Signature(s) Signed: 01/29/2015 3:53:57 PM By: Evlyn Kanner MD, FACS Entered By: Evlyn Kanner on 01/29/2015 15:53:56

## 2015-01-29 NOTE — Progress Notes (Addendum)
SANG, BLOUNT ([id number]) Visit Report for 01/29/2015 Arrival Information Details Patient Name: [name], [name] Date of Service: 01/29/2015 2:45 PM Medical Record Number: [id number] Patient Account Number: [id number] Date of Birth/Sex: 1936/05/03 (79 y.o. Male) Treating RN: Curtis Sites Primary Care Physician: Dorothey Baseman Other Clinician: Referring Physician: Terance Hart, DAVID Treating Physician/Extender: Rudene Re in Treatment: 2 Visit Information History Since Last Visit Added or deleted any medications: No Patient Arrived: Wheel Chair Any new allergies or adverse reactions: No Arrival Time: 14:57 Had a fall or experienced change in No activities of daily living that may affect Accompanied By: self risk of falls: Transfer Assistance: Manual Signs or symptoms of abuse/neglect since last No Patient Identification Verified: Yes visito Secondary Verification Process Yes Hospitalized since last visit: No Completed: Pain Present Now: No Patient Requires Transmission-Based No Precautions: Patient Has Alerts: No Electronic Signature(s) Signed: 01/29/2015 4:46:32 PM By: Curtis Sites Entered By: Curtis Sites on 01/29/2015 14:57:53 [name] ([id number]) -------------------------------------------------------------------------------- Encounter Discharge Information Details Patient Name: [name] Date of Service: 01/29/2015 2:45 PM Medical Record Number: [id number] Patient Account Number: [id number] Date of Birth/Sex: 1936/01/29 (79 y.o. Male) Treating RN: Curtis Sites Primary Care Physician: Dorothey Baseman Other Clinician: Referring Physician: Dorothey Baseman Treating Physician/Extender: Rudene Re in Treatment: 2 Encounter Discharge Information Items Discharge Pain Level: 0 Discharge Condition: Stable Ambulatory Status: Wheelchair Nursing Discharge Destination: Home Transportation: Private Auto Accompanied By:  self Schedule Follow-up Appointment: Yes Medication Reconciliation completed No and provided to Patient/Care Tyrianna Lightle: Clinical Summary of Care: Electronic Signature(s) Signed: 01/29/2015 4:46:32 PM By: Curtis Sites Entered By: Curtis Sites on 01/29/2015 15:37:03 [name] ([id number]) -------------------------------------------------------------------------------- Lower Extremity Assessment Details Patient Name: [name] Date of Service: 01/29/2015 2:45 PM Medical Record Number: [id number] Patient Account Number: [id number] Date of Birth/Sex: 06-Jan-1936 (79 y.o. Male) Treating RN: Curtis Sites Primary Care Physician: Terance Hart, DAVID Other Clinician: Referring Physician: Terance Hart, DAVID Treating Physician/Extender: Rudene Re in Treatment: 2 Edema Assessment Assessed: [Left: No] [Right: No] Edema: [Left: Ye] [Right: s] Calf Left: Right: Point of Measurement: 40 cm From Medial Instep cm cm Ankle Left: Right: Point of Measurement: 10 cm From Medial Instep cm cm Vascular Assessment Pulses: Posterior Tibial Dorsalis Pedis Palpable: [Right:Yes] Extremity colors, hair growth, and conditions: Extremity Color: [Right:Hyperpigmented] Hair Growth on Extremity: [Right:No] Temperature of Extremity: [Right:Warm] Capillary Refill: [Right:< 3 seconds] Toe Nail Assessment Left: Right: Thick: Yes Discolored: Yes Deformed: Yes Improper Length and Hygiene: Yes Electronic Signature(s) Signed: 01/29/2015 4:46:32 PM By: Curtis Sites Entered By: Curtis Sites on 01/29/2015 15:28:28 [name] ([id number]) -------------------------------------------------------------------------------- Multi Wound Chart Details Patient Name: [name] Date of Service: 01/29/2015 2:45 PM Medical Record Number: [id number] Patient Account Number: [id number] Date of Birth/Sex: 1935-07-18 (79 y.o. Male) Treating RN: Curtis Sites Primary Care Physician:  Dorothey Baseman Other Clinician: Referring Physician: Terance Hart, DAVID Treating Physician/Extender: Rudene Re in Treatment: 2 Vital Signs Height(in): 68 Pulse(bpm): 68 Weight(lbs): Blood Pressure 154/74 (mmHg): Body Mass Index(BMI): Temperature(F): 97.9 Respiratory Rate 20 (breaths/min): Photos: [5:No Photos] [N/A:N/A] Wound Location: [5:Right Lower Leg - Medial] [N/A:N/A] Wounding Event: [5:Gradually Appeared] [N/A:N/A] Primary Etiology: [5:Venous Leg Ulcer] [N/A:N/A] Comorbid History: [5:Cataracts, Anemia, Hypertension, Osteoarthritis] [N/A:N/A] Date Acquired: [5:11/13/2014] [N/A:N/A] Weeks of Treatment: [5:2] [N/A:N/A] Wound Status: [5:Open] [N/A:N/A] Measurements L x W x D 14.4x8.6x0.4 [N/A:N/A] (cm) Area (cm) : [5:97.264] [N/A:N/A] Volume (cm) : [5:38.905] [N/A:N/A] % Reduction in Area: [5:5.10%] [N/A:N/A] % Reduction in Volume: -26.50% [N/A:N/A] Classification: [5:Full Thickness Without Exposed Support Structures] [N/A:N/A] Exudate  Amount: [5:Large] [N/A:N/A] Exudate Type: [5:Serosanguineous] [N/A:N/A] Exudate Color: [5:red, brown] [N/A:N/A] Foul Odor After [5:Yes] [N/A:N/A] Cleansing: Odor Anticipated Due to No [N/A:N/A] Product Use: Wound Margin: [5:Distinct, outline attached] [N/A:N/A] Granulation Amount: [5:Medium (34-66%)] [N/A:N/A] Granulation Quality: [5:Pink, Pale] [N/A:N/A] Necrotic Amount: [5:Small (1-33%)] [N/A:N/A] Exposed Structures: Fascia: No N/A N/A Fat: No Tendon: No Muscle: No Joint: No Bone: No Limited to Skin Breakdown Epithelialization: None N/A N/A Periwound Skin Texture: Edema: Yes N/A N/A Excoriation: No Induration: No Callus: No Crepitus: No Fluctuance: No Friable: No Rash: No Scarring: No Periwound Skin Moist: Yes N/A N/A Moisture: Maceration: No Dry/Scaly: No Periwound Skin Color: Mottled: Yes N/A N/A Atrophie Blanche: No Cyanosis: No Ecchymosis: No Erythema: No Hemosiderin Staining: No Pallor:  No Rubor: No Temperature: No Abnormality N/A N/A Tenderness on No N/A N/A Palpation: Wound Preparation: Ulcer Cleansing: Other: N/A N/A soap and water Topical Anesthetic Applied: Other: lidocaine 4% Assessment Notes: maggots found in patient's N/A N/A wound - see photo - removed and wound cleansed. SNF notified Treatment Notes Electronic Signature(s) Signed: 01/29/2015 4:46:32 PM By: Curtis Sites Entered By: Curtis Sites on 01/29/2015 15:35:11 CRYSTIAN, TIMBERS ([id number]) NEY, TRIPODI ([id number]) -------------------------------------------------------------------------------- Multi-Disciplinary Care Plan Details Patient Name: JARRELL, SFERRAZZA. Date of Service: 01/29/2015 2:45 PM Medical Record Number: [id number] Patient Account Number: [id number] Date of Birth/Sex: 17-Dec-1935 (79 y.o. Male) Treating RN: Curtis Sites Primary Care Physician: Dorothey Baseman Other Clinician: Referring Physician: Dorothey Baseman Treating Physician/Extender: Rudene Re in Treatment: 2 Active Inactive Orientation to the Wound Care Program Nursing Diagnoses: Knowledge deficit related to the wound healing center program Goals: Patient/caregiver will verbalize understanding of the Wound Healing Center Program Date Initiated: 01/15/2015 Goal Status: Active Interventions: Provide education on orientation to the wound center Notes: Venous Leg Ulcer Nursing Diagnoses: Knowledge deficit related to disease process and management Potential for venous Insuffiency (use before diagnosis confirmed) Goals: Patient will maintain optimal edema control Date Initiated: 01/15/2015 Goal Status: Active Patient/caregiver will verbalize understanding of disease process and disease management Date Initiated: 01/15/2015 Goal Status: Active Verify adequate tissue perfusion prior to therapeutic compression application Date Initiated: 01/15/2015 Goal Status: Active Interventions: Assess  peripheral edema status every visit. Compression as ordered Provide education on venous insufficiency JAVONN, LENHOFF ([id number]) Treatment Activities: Test ordered outside of clinic : 01/29/2015 Therapeutic compression applied : 01/29/2015 Venous Duplex Doppler : 01/29/2015 Notes: Wound/Skin Impairment Nursing Diagnoses: Impaired tissue integrity Knowledge deficit related to ulceration/compromised skin integrity Goals: Patient/caregiver will verbalize understanding of skin care regimen Date Initiated: 01/15/2015 Goal Status: Active Ulcer/skin breakdown will have a volume reduction of 30% by week 4 Date Initiated: 01/15/2015 Goal Status: Active Ulcer/skin breakdown will have a volume reduction of 50% by week 8 Date Initiated: 01/15/2015 Goal Status: Active Ulcer/skin breakdown will have a volume reduction of 80% by week 12 Date Initiated: 01/15/2015 Goal Status: Active Ulcer/skin breakdown will heal within 14 weeks Date Initiated: 01/15/2015 Goal Status: Active Interventions: Assess patient/caregiver ability to perform ulcer/skin care regimen upon admission and as needed Assess ulceration(s) every visit Provide education on ulcer and skin care Notes: Electronic Signature(s) Signed: 01/29/2015 4:46:32 PM By: Curtis Sites Entered By: Curtis Sites on 01/29/2015 15:35:04 [name] ([id number]) -------------------------------------------------------------------------------- Patient/Caregiver Education Details Patient Name: [name] Date of Service: 01/29/2015 2:45 PM Medical Record Number: [id number] Patient Account Number: [id number] Date of Birth/Gender: 1935/12/31 (79 y.o. Male) Treating RN: Curtis Sites Primary Care Physician: Dorothey Baseman Other Clinician: Referring Physician: Dorothey Baseman Treating Physician/Extender: Rudene Re in Treatment:  2 Education Assessment Education Provided To: Patient Education Topics  Provided Nutrition: Handouts: Other: nutrition and wound healing Methods: Explain/Verbal Responses: State content correctly Electronic Signature(s) Signed: 01/29/2015 4:19:18 PM By: Curtis Sites Entered By: Curtis Sites on 01/29/2015 16:19:18 [name] ([id number]) -------------------------------------------------------------------------------- Wound Assessment Details Patient Name: [name] Date of Service: 01/29/2015 2:45 PM Medical Record Number: [id number] Patient Account Number: [id number] Date of Birth/Sex: 11/09/1935 (79 y.o. Male) Treating RN: Curtis Sites Primary Care Physician: Terance Hart, DAVID Other Clinician: Referring Physician: Terance Hart, DAVID Treating Physician/Extender: Rudene Re in Treatment: 2 Wound Status Wound Number: 5 Primary Venous Leg Ulcer Etiology: Wound Location: Right Lower Leg - Medial Wound Status: Open Wounding Event: Gradually Appeared Comorbid Cataracts, Anemia, Hypertension, Date Acquired: 11/13/2014 History: Osteoarthritis Weeks Of Treatment: 2 Clustered Wound: No Photos Wound Measurements Length: (cm) 14.4 Width: (cm) 8.6 Depth: (cm) 0.4 Area: (cm) 97.264 Volume: (cm) 38.905 % Reduction in Area: 5.1% % Reduction in Volume: -26.5% Epithelialization: None Tunneling: No Undermining: No Wound Description Full Thickness Without Exposed Classification: Support Structures Wound Margin: Distinct, outline attached Exudate Large Amount: Exudate Type: Serosanguineous Exudate Color: red, brown Foul Odor After Cleansing: Yes Due to Product Use: No Wound Bed Granulation Amount: Medium (34-66%) Exposed Structure Granulation Quality: Pink, Pale Fascia Exposed: No Necrotic Amount: Small (1-33%) Fat Layer Exposed: No GIBBS, NAUGLE ([id number]) Necrotic Quality: Adherent Slough Tendon Exposed: No Muscle Exposed: No Joint Exposed: No Bone Exposed: No Limited to Skin Breakdown Periwound Skin  Texture Texture Color No Abnormalities Noted: No No Abnormalities Noted: No Callus: No Atrophie Blanche: No Crepitus: No Cyanosis: No Excoriation: No Ecchymosis: No Fluctuance: No Erythema: No Friable: No Hemosiderin Staining: No Induration: No Mottled: Yes Localized Edema: Yes Pallor: No Rash: No Rubor: No Scarring: No Temperature / Pain Moisture Temperature: No Abnormality No Abnormalities Noted: No Dry / Scaly: No Maceration: No Moist: Yes Wound Preparation Ulcer Cleansing: Other: soap and water, Topical Anesthetic Applied: Other: lidocaine 4%, Assessment Notes maggots found in patient's wound - see photo - removed and wound cleansed. SNF notified - I spoke with Lelon Mast about this and an order was faxed that states that if SNF nurse changes wrap, the nurse is to call [facility] to be instructed in proper unna wrap prior to wrapping patient's leg. Unna wrap from SNF left the heel completely open and excluded from the wrap which is likely how the maggots entered the wound Treatment Notes Wound #5 (Right, Medial Lower Leg) 1. Cleansed with: Cleanse wound with antibacterial soap and water 2. Anesthetic Topical Lidocaine 4% cream to wound bed prior to debridement 3. Peri-wound Care: Barrier cream 4. Dressing Applied: Aquacel Ag Other dressing (specify in notes) 5. Secondary Dressing Applied ABD and Kerlix/Conform OREST, DYGERT. ([id number]) 7. Secured with Tape Henriette Combs to Right Lower Extremity Notes xtrasorb Electronic Signature(s) Signed: 01/30/2015 12:11:54 PM By: Curtis Sites Previous Signature: 01/29/2015 4:46:32 PM Version By: Curtis Sites Entered By: Curtis Sites on 01/30/2015 12:11:54 [name] ([id number]) -------------------------------------------------------------------------------- Vitals Details Patient Name: [name] Date of Service: 01/29/2015 2:45 PM Medical Record Number: [id number] Patient Account Number:  [id number] Date of Birth/Sex: 1935-09-30 (79 y.o. Male) Treating RN: Curtis Sites Primary Care Physician: Dorothey Baseman Other Clinician: Referring Physician: Terance Hart, DAVID Treating Physician/Extender: Rudene Re in Treatment: 2 Vital Signs Time Taken: 14:58 Temperature (F): 97.9 Height (in): 68 Pulse (bpm): 68 Respiratory Rate (breaths/min): 20 Blood Pressure (mmHg): 154/74 Reference Range: 80 - 120 mg / dl Electronic Signature(s) Signed: 01/29/2015 4:46:32 PM  By: Curtis Sites Entered ByCurtis Sites on 01/29/2015 14:58:29

## 2015-02-05 ENCOUNTER — Ambulatory Visit: Payer: Medicare Other | Admitting: Surgery

## 2015-02-07 ENCOUNTER — Encounter: Payer: Medicare Other | Attending: Surgery | Admitting: Surgery

## 2015-02-07 DIAGNOSIS — I87311 Chronic venous hypertension (idiopathic) with ulcer of right lower extremity: Secondary | ICD-10-CM | POA: Insufficient documentation

## 2015-02-07 DIAGNOSIS — I10 Essential (primary) hypertension: Secondary | ICD-10-CM | POA: Insufficient documentation

## 2015-02-07 DIAGNOSIS — G20 Parkinson's disease: Secondary | ICD-10-CM | POA: Diagnosis not present

## 2015-02-07 DIAGNOSIS — L97313 Non-pressure chronic ulcer of right ankle with necrosis of muscle: Secondary | ICD-10-CM | POA: Diagnosis present

## 2015-02-07 DIAGNOSIS — I890 Lymphedema, not elsewhere classified: Secondary | ICD-10-CM | POA: Insufficient documentation

## 2015-02-07 NOTE — Progress Notes (Addendum)
ERMON, SAGAN ([id number]) Visit Report for 02/07/2015 Chief Complaint Document Details Patient Name: [name], [name] Date of Service: 02/07/2015 10:00 AM Medical Record Number: [id number] Patient Account Number: [id number] Date of Birth/Sex: 1936-05-21 (79 y.o. Male) Treating RN: Primary Care Physician: Terance Hart, DAVID Other Clinician: Referring Physician: Terance Hart, DAVID Treating Physician/Extender: Rudene Re in Treatment: 3 Information Obtained from: Patient Chief Complaint Patient presents for treatment of an open ulcer due to venous insufficiency. The patient has had a open wound on his right medial ankle for at least 3 years and was last seen in the wound clinic in February 2015. He was lost to follow-up after that. Electronic Signature(s) Signed: 02/07/2015 10:25:54 AM By: Evlyn Kanner MD, FACS Entered By: Evlyn Kanner on 02/07/2015 10:25:54 OMERO, KOWAL ([id number]) -------------------------------------------------------------------------------- HPI Details Patient Name: [name], [name] Date of Service: 02/07/2015 10:00 AM Medical Record Number: [id number] Patient Account Number: [id number] Date of Birth/Sex: March 11, 1936 (79 y.o. Male) Treating RN: Primary Care Physician: Dorothey Baseman Other Clinician: Referring Physician: Terance Hart, DAVID Treating Physician/Extender: Rudene Re in Treatment: 3 History of Present Illness Location: right lower extremity ulceration Quality: Patient reports experiencing a dull pain to affected area(s). Severity: Patient states wound are getting worse. 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 Modifying Factors: Consults to  this date include:has received treatment in the wound center in the past but stopped coming here since February 2015 Associated Signs and Symptoms: Patient reports having difficulty standing for long periods. HPI Description: 79 year old male who is known to have progressive weakness and has been in a nursing home for a while has had chronic lower extremity edema both legs and a large open wound on the right lower extremity which is has at least for about 3-4 years. The patient was seen in the wound clinic before and has been treated until February 2015 when he was lost to follow-up. Past medical history is significant for hypertension, gout, venous stasis ulcers, Parkinson's [name], constipation. He's not been a diabetic but his last hemoglobin A1c was 6 in March 2015. There is documentation that he's had endovenous ablation of bilateral varicose veins but we have no documentation about this and this may be done at least 4-5 years ago. 01/22/2015 -- he has his vascular test is scheduled for this afternoon. 01/29/2015 -- the patient's vascular test was canceled last week because he was unable to get onto the examining bed at [facility]. His Unna's boot was not applied either and the patient had a dressing placed by home health. Today when his dressing was removed we found a lot of maggots in his right lower extremity. 02/07/2015 -- Was seen in the vascular office by the PA and she noted that a bilateral venous duplex study did not show any DVT, SVT or reflux bilaterally. The patient was advised to continue with Unna's boot and would at some stage to require graduated compression stockings of the 20-30 mmHg variety. In the future lymphedema pumps would also be considered. Electronic Signature(s) Signed: 02/07/2015 10:26:12 AM By: Evlyn Kanner MD, FACS Previous Signature: 02/07/2015 10:19:29 AM Version By: Evlyn Kanner MD, FACS Entered By: Evlyn Kanner on 02/07/2015 10:26:11 Sandra Cockayne P.  (  [id number]) -------------------------------------------------------------------------------- Physical Exam Details Patient Name: [name], [name] Date of Service: 02/07/2015 10:00 AM Medical Record Number: [id number] Patient Account Number: [id number] Date of Birth/Sex: [birth year] (79 y.o. Male) Treating RN: Primary Care Physician: Dorothey Baseman Other Clinician: Referring Physician: Terance Hart, DAVID Treating Physician/Extender: Rudene Re in Treatment: 3 Constitutional . Pulse regular. Respirations normal and unlabored. Afebrile. . Eyes Nonicteric. Reactive to light. Ears, Nose, Mouth, and Throat Lips, teeth, and gums WNL.Marland Kitchen Moist mucosa without lesions . Neck supple and nontender. No palpable supraclavicular or cervical adenopathy. Normal sized without goiter. Respiratory WNL. No retractions.. Cardiovascular Pedal Pulses WNL. he has +2 pitting edema of his right lower extremity. Integumentary (Hair, Skin) No suspicious lesions. No crepitus or fluctuance. No peri-wound warmth or erythema. No masses.Marland Kitchen Psychiatric Judgement and insight Intact.. No evidence of depression, anxiety, or agitation.. Notes the wound is looking much cleaner today with healthy granulation tissue and minimal slough. Electronic Signature(s) Signed: 02/07/2015 10:27:20 AM By: Evlyn Kanner MD, FACS Entered By: Evlyn Kanner on 02/07/2015 10:27:19 CARLA, WHILDEN ([id number]) -------------------------------------------------------------------------------- Physician Orders Details Patient Name: [name], [name] Date of Service: 02/07/2015 10:00 AM Medical Record Number: [id number] Patient Account Number: [id number] Date of Birth/Sex: 1935/12/14 (79 y.o. Male) Treating RN: Curtis Sites Primary Care Physician: Terance Hart, DAVID Other Clinician: Referring Physician: Terance Hart, DAVID Treating Physician/Extender: Rudene Re in Treatment: 3 Verbal / Phone Orders: Yes Clinician: Curtis Sites Read Back and Verified: Yes Diagnosis Coding Wound Cleansing Wound #5 Right,Medial Lower Leg o Cleanse wound with mild soap and water Anesthetic Wound #5 Right,Medial Lower Leg o Topical Lidocaine 4% cream applied to wound bed prior to debridement Skin Barriers/Peri-Wound Care Wound #5 Right,Medial Lower Leg o Barrier cream o Moisturizing lotion Primary Wound Dressing Wound #5 Right,Medial Lower Leg o Hydrafera Blue Secondary Dressing Wound #5 Right,Medial Lower Leg o Gauze, ABD and Kerlix/Conform o XtraSorb Dressing Change Frequency Wound #5 Right,Medial Lower Leg o Change dressing every week Follow-up Appointments Wound #5 Right,Medial Lower Leg o Return Appointment in 1 week. Edema Control Wound #5 Right,Medial Lower Leg o Unna Boot to Right Lower Extremity JAMERIUS, BOECKMAN ([id number]) Additional Orders / Instructions Wound #5 Right,Medial Lower Leg o Other: - SNF nurse may change unna wrap as needed for copius drainage or odor. SNF nurse is to call [facility] prior to changing unna wrap for instruction r/t improper unna wrap found on patient during 01/29/2015 visit. If unna wrap is removed, it must be replaced as ordered. Please call us at [phone] to be sure wrap is applied correctly prior to changing wrap. Speak with Selena Batten or Mardene Celeste Electronic Signature(s) Signed: 02/07/2015 12:28:06 PM By: Evlyn Kanner MD, FACS Signed: 02/07/2015 3:55:41 PM By: Curtis Sites Entered By: Curtis Sites on 02/07/2015 10:26:30 TRAYVON, TRUMBULL ([id number]) -------------------------------------------------------------------------------- Problem List Details Patient Name: [name], [name] Date of Service: 02/07/2015 10:00 AM Medical Record Number: [id number] Patient Account Number: [id number] Date of Birth/Sex: 1935/11/14 (79 y.o. 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 Date of Service: 02/07/2015 10:00 AM Medical Record Number: [id number] Patient Account Number: [id number] Date of Birth/Sex: 22-May-1936 (79 y.o. Male) Treating RN: Primary Care Physician: Dorothey Baseman Other Clinician: Referring Physician: Terance Hart, DAVID Treating Physician/Extender: Rudene Re in Treatment: 3 Subjective Chief Complaint Information obtained from Patient Patient presents for treatment of an open ulcer due to venous insufficiency. The patient has had a open wound on his right medial ankle for at least 3 years and was last seen in the wound clinic in February 2015. He was lost to follow-up after that. History of Present Illness (HPI) The following HPI elements were documented for the patient's wound: Location: right lower extremity ulceration Quality: Patient reports experiencing a dull pain to affected area(s). Severity: Patient states wound are getting worse. 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 Modifying Factors: Consults to this date include:has received treatment in the wound center in the past but stopped coming here since February 2015 Associated Signs and Symptoms: Patient reports having difficulty standing for long periods. 79 year old male who is known to have progressive weakness and has been in a nursing home for a while has had chronic lower extremity edema both legs and a large open wound on the right lower extremity which is has at least for about 3-4 years. The patient was seen in the wound clinic before and has been treated until February 2015 when he was lost to follow-up. Past medical history is significant for hypertension, gout, venous stasis ulcers, Parkinson's [name], constipation. He's not been a diabetic but his last hemoglobin A1c was 6 in March 2015. There is documentation that he's had endovenous ablation of bilateral varicose veins but we have no documentation about this and this may be done at least 4-5 years ago. 01/22/2015 -- he has his vascular test is scheduled for this afternoon. 01/29/2015 -- the patient's vascular test was canceled last week because he was unable to get onto the examining bed at [facility]. His Unna's boot was not applied either and the patient had a dressing placed by home health. Today when his dressing was removed we found a lot of maggots in his right lower extremity. 02/07/2015 -- Was seen in the vascular office by the PA and she noted that a bilateral venous duplex study did not show any DVT, SVT or reflux bilaterally. The patient was advised to continue with Unna's boot and would at some stage to require graduated compression stockings of the 20-30 mmHg variety. In the future lymphedema pumps would also be considered. [name], [name] ([id number]) Objective Constitutional Pulse regular. Respirations normal and  unlabored. Afebrile. Vitals Time Taken: 10:00 AM, Height: 68 in, Temperature: 98.2 F, Pulse: 62 bpm, Respiratory Rate: 18 breaths/min, Blood Pressure: 147/61 mmHg. Eyes Nonicteric. Reactive to light. Ears, Nose, Mouth, and Throat Lips, teeth, and gums WNL.Marland Kitchen Moist mucosa without lesions . Neck supple and nontender. No palpable supraclavicular or cervical adenopathy. Normal sized without goiter. Respiratory WNL. No retractions.. Cardiovascular Pedal Pulses WNL. he has +2 pitting edema of his right lower extremity. Psychiatric Judgement and insight Intact.Marland Kitchen No  evidence of depression, anxiety, or agitation.. General Notes: the wound is looking much cleaner today with healthy granulation tissue and minimal slough. Integumentary (Hair, Skin) No suspicious lesions. No crepitus or fluctuance. No peri-wound warmth or erythema. No masses.. Wound #5 status is Open. Original cause of wound was Gradually Appeared. The wound is located on the Right,Medial Lower Leg. The wound measures 12.4cm length x 7cm width x 0.4cm depth; 68.173cm^2 area and 27.269cm^3 volume. The wound is limited to skin breakdown. There is no tunneling or undermining noted. There is a large amount of serosanguineous drainage noted. The wound margin is distinct with the outline attached to the wound base. There is large (67-100%) pink, pale granulation within the wound bed. There is a small (1-33%) amount of necrotic tissue within the wound bed including Adherent Slough. The periwound skin appearance exhibited: Localized Edema, Moist, Mottled. The periwound skin appearance did not exhibit: Callus, Crepitus, Excoriation, Fluctuance, Friable, Induration, Rash, Scarring, Dry/Scaly, Maceration, Atrophie Blanche, Cyanosis, Ecchymosis, Hemosiderin Staining, Pallor, Rubor, Erythema. [name], [name] ([id number]) Periwound temperature was noted as No Abnormality. 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 he is advised to elevate his legs as much as possible and with his diagnosis now of lymphedema we will be able to use compression for a while and once he is completely healed think about using lymphedema pumps. Plan Wound Cleansing: Wound #5 Right,Medial Lower Leg: Cleanse wound with mild soap and water Anesthetic: Wound #5 Right,Medial Lower Leg: Topical Lidocaine 4% cream applied to wound bed prior to debridement Skin Barriers/Peri-Wound Care: Wound #5 Right,Medial Lower Leg: Barrier cream Moisturizing lotion Primary Wound Dressing: Wound #5 Right,Medial Lower Leg: Hydrafera Blue Secondary Dressing: Wound #5 Right,Medial Lower Leg: Gauze, ABD and Kerlix/Conform XtraSorb Dressing Change Frequency: Wound #5 Right,Medial Lower Leg: Change dressing every week Follow-up Appointments: Wound #5 Right,Medial Lower Leg: [name], [name] ([id number]) Return Appointment in 1 week. Edema Control: Wound #5 Right,Medial Lower Leg: Unna Boot to Right Lower Extremity Additional Orders / Instructions: Wound #5 Right,Medial Lower Leg: Other: - SNF nurse may change unna wrap as needed for copius drainage or odor. SNF nurse is to call [facility] prior to changing unna wrap for instruction r/t improper unna wrap found on patient during 01/29/2015 visit. If unna wrap is removed, it must be replaced as ordered. Please call us at [phone] to be sure wrap is applied correctly prior to changing wrap. Speak with Selena Batten or Mardene Celeste We will change over to [facility] and put some drawtex and cover this with a compression  wrap which is the Unna's boot. he is advised to elevate his legs as much as possible and with his diagnosis now of lymphedema we will be able to use compression for a while and once he is completely healed think about using lymphedema pumps. Electronic Signature(s) Signed: 02/07/2015 10:28:47 AM By: Evlyn Kanner MD, FACS Entered By: Evlyn Kanner on 02/07/2015 10:28:47 [name], [name] ([id number]) -------------------------------------------------------------------------------- SuperBill Details Patient Name: [name], [name] Date of Service: 02/07/2015 Medical Record Number: [id number] Patient Account Number: [id number] Date of Birth/Sex: 1936-04-28 (79 y.o. 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 E66.01 Morbid (severe) obesity due to excess calories I89.0 Lymphedema, not elsewhere classified Facility Procedures CPT4: Description Modifier Quantity Code [id number] (Facility Use Only) [phone] - APPLY MULTLAY COMPRS LWR RT 1 LEG Physician Procedures CPT4 Code Description: [id number] - WC PHYS LEVEL 3 - EST PT ICD-10 Description Diagnosis I87.311 Chronic venous hypertension (idiopathic) with ulcer of 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 E66.01 Morbid (severe) obesity due  to excess calories I89.0 Lymphedema, not elsewhere classified Modifier: right lower s of muscle Quantity: 1 extremity Electronic Signature(s) Signed: 02/07/2015 10:29:03 AM By: Evlyn Kanner MD, FACS Entered By: Evlyn Kanner on 02/07/2015 10:29:03

## 2015-02-08 NOTE — Progress Notes (Signed)
[name], [name] ([id number]) Visit Report for 02/07/2015 Arrival Information Details Patient Name: [name], [name] Date of Service: 02/07/2015 10:00 AM Medical Record Number: [id number] Patient Account Number: [id number] Date of Birth/Sex: 26-Aug-1935 (79 y.o. Male) Treating RN: Curtis Sites Primary Care Physician: Dorothey Baseman Other Clinician: Referring Physician: Terance Hart, DAVID Treating Physician/Extender: Rudene Re in Treatment: 3 Visit Information History Since Last Visit Added or deleted any medications: No Patient Arrived: Wheel Chair Any new allergies or adverse reactions: No Arrival Time: 09:58 Had a fall or experienced change in No activities of daily living that may affect Accompanied By: self risk of falls: Transfer Assistance: Hoyer Lift Signs or symptoms of abuse/neglect since last No Patient Identification Verified: Yes visito Secondary Verification Process Yes Hospitalized since last visit: No Completed: Has Dressing in Place as Prescribed: No Patient Requires Transmission-Based No Has Compression in Place as Prescribed: No Precautions: Pain Present Now: No Patient Has Alerts: No Electronic Signature(s) Signed: 02/07/2015 3:55:41 PM By: Curtis Sites Entered By: Curtis Sites on 02/07/2015 09:59:27 [name] ([id number]) -------------------------------------------------------------------------------- Encounter Discharge Information Details Patient Name: [name] Date of Service: 02/07/2015 10:00 AM Medical Record Number: [id number] Patient Account Number: [id number] Date of Birth/Sex: 11/19/1935 (79 y.o. Male) Treating RN: Primary Care Physician: Dorothey Baseman Other Clinician: Referring Physician: Terance Hart, DAVID Treating Physician/Extender: Rudene Re in Treatment: 3 Encounter Discharge Information Items Schedule Follow-up Appointment: No Medication Reconciliation completed No and provided to Patient/Care  [name]: Provided on Clinical Summary of Care: 02/07/2015 Form Type Recipient Paper Patient [facility] Electronic Signature(s) Signed: 02/07/2015 10:54:35 AM By: Gwenlyn Perking Entered By: Gwenlyn Perking on 02/07/2015 10:54:35 [name], [name] [name] ([id number]) -------------------------------------------------------------------------------- Lower Extremity Assessment Details Patient Name: [name], [name]. Date of Service: 02/07/2015 10:00 AM Medical Record Number: [id number] Patient Account Number: [id number] Date of Birth/Sex: 02-04-1936 (79 y.o. Male) Treating RN: Curtis Sites Primary Care Physician: Terance Hart, DAVID Other Clinician: Referring Physician: Terance Hart, DAVID Treating Physician/Extender: Rudene Re in Treatment: 3 Edema Assessment Assessed: [Left: No] [Right: No] Edema: [Left: Ye] [Right: s] Calf Left: Right: Point of Measurement: 40 cm From Medial Instep cm 46.4 cm Ankle Left: Right: Point of Measurement: 10 cm From Medial Instep cm 28.2 cm Vascular Assessment Pulses: Posterior Tibial Dorsalis Pedis Palpable: [Right:Yes] Extremity colors, hair growth, and conditions: Extremity Color: [Right:Hyperpigmented] Hair Growth on Extremity: [Right:No] Temperature of Extremity: [Right:Warm] Capillary Refill: [Right:< 3 seconds] Toe Nail Assessment Left: Right: Thick: Yes Discolored: Yes Deformed: Yes Improper Length and Hygiene: Yes Electronic Signature(s) Signed: 02/07/2015 3:55:41 PM By: Curtis Sites Entered By: Curtis Sites on 02/07/2015 10:09:33 [name] ([id number]) -------------------------------------------------------------------------------- Multi Wound Chart Details Patient Name: [name] Date of Service: 02/07/2015 10:00 AM Medical Record Number: [id number] Patient Account Number: [id number] Date of Birth/Sex: 05-03-1936 (79 y.o. Male) Treating RN: Curtis Sites Primary Care Physician: Dorothey Baseman Other Clinician: Referring  Physician: Terance Hart, DAVID Treating Physician/Extender: Rudene Re in Treatment: 3 Vital Signs Height(in): 68 Pulse(bpm): 62 Weight(lbs): Blood Pressure 147/61 (mmHg): Body Mass Index(BMI): Temperature(F): 98.2 Respiratory Rate 18 (breaths/min): Photos: [5:No Photos] [N/A:N/A] Wound Location: [5:Right Lower Leg - Medial] [N/A:N/A] Wounding Event: [5:Gradually Appeared] [N/A:N/A] Primary Etiology: [5:Venous Leg Ulcer] [N/A:N/A] Comorbid History: [5:Cataracts, Anemia, Hypertension, Osteoarthritis] [N/A:N/A] Date Acquired: [5:11/13/2014] [N/A:N/A] Weeks of Treatment: [5:3] [N/A:N/A] Wound Status: [5:Open] [N/A:N/A] Measurements L x W x D 12.4x7x0.4 [N/A:N/A] (cm) Area (cm) : [5:68.173] [N/A:N/A] Volume (cm) : [5:27.269] [N/A:N/A] % Reduction in Area: [5:33.50%] [N/A:N/A] % Reduction in Volume: 11.30% [N/A:N/A] Classification: [5:Full Thickness Without Exposed  Support Structures] [N/A:N/A] Exudate Amount: [5:Large] [N/A:N/A] Exudate Type: [5:Serosanguineous] [N/A:N/A] Exudate Color: [5:red, brown] [N/A:N/A] Foul Odor After [5:Yes] [N/A:N/A] Cleansing: Odor Anticipated Due to No [N/A:N/A] Product Use: Wound Margin: [5:Distinct, outline attached] [N/A:N/A] Granulation Amount: [5:Large (67-100%)] [N/A:N/A] Granulation Quality: [5:Pink, Pale] [N/A:N/A] Necrotic Amount: [5:Small (1-33%)] [N/A:N/A] Exposed Structures: Fascia: No N/A N/A Fat: No Tendon: No Muscle: No Joint: No Bone: No Limited to Skin Breakdown Epithelialization: None N/A N/A Periwound Skin Texture: Edema: Yes N/A N/A Excoriation: No Induration: No Callus: No Crepitus: No Fluctuance: No Friable: No Rash: No Scarring: No Periwound Skin Moist: Yes N/A N/A Moisture: Maceration: No Dry/Scaly: No Periwound Skin Color: Mottled: Yes N/A N/A Atrophie Blanche: No Cyanosis: No Ecchymosis: No Erythema: No Hemosiderin Staining: No Pallor: No Rubor: No Temperature: No Abnormality N/A  N/A Tenderness on No N/A N/A Palpation: Wound Preparation: Ulcer Cleansing: Other: N/A N/A soap and water Topical Anesthetic Applied: Other: lidocaine 4% Treatment Notes Electronic Signature(s) Signed: 02/07/2015 3:55:41 PM By: Curtis Sites Entered By: Curtis Sites on 02/07/2015 10:12:15 [name], [name] ([id number]) -------------------------------------------------------------------------------- Multi-Disciplinary Care Plan Details Patient Name: [name], [name] Date of Service: 02/07/2015 10:00 AM Medical Record Number: [id number] Patient Account Number: [id number] Date of Birth/Sex: Nov 14, 1935 (79 y.o. Male) Treating RN: Curtis Sites Primary Care Physician: Dorothey Baseman Other Clinician: Referring Physician: Dorothey Baseman Treating Physician/Extender: Rudene Re in Treatment: 3 Active Inactive Orientation to the Wound Care Program Nursing Diagnoses: Knowledge deficit related to the wound healing center program Goals: Patient/caregiver will verbalize understanding of the Wound Healing Center Program Date Initiated: 01/15/2015 Goal Status: Active Interventions: Provide education on orientation to the wound center Notes: Venous Leg Ulcer Nursing Diagnoses: Knowledge deficit related to disease process and management Potential for venous Insuffiency (use before diagnosis confirmed) Goals: Patient will maintain optimal edema control Date Initiated: 01/15/2015 Goal Status: Active Patient/caregiver will verbalize understanding of disease process and disease management Date Initiated: 01/15/2015 Goal Status: Active Verify adequate tissue perfusion prior to therapeutic compression application Date Initiated: 01/15/2015 Goal Status: Active Interventions: Assess peripheral edema status every visit. Compression as ordered Provide education on venous insufficiency [name], [name] ([id number]) Treatment Activities: Test ordered outside of clinic :  02/07/2015 Therapeutic compression applied : 02/07/2015 Venous Duplex Doppler : 02/07/2015 Notes: Wound/Skin Impairment Nursing Diagnoses: Impaired tissue integrity Knowledge deficit related to ulceration/compromised skin integrity Goals: Patient/caregiver will verbalize understanding of skin care regimen Date Initiated: 01/15/2015 Goal Status: Active Ulcer/skin breakdown will have a volume reduction of 30% by week 4 Date Initiated: 01/15/2015 Goal Status: Active Ulcer/skin breakdown will have a volume reduction of 50% by week 8 Date Initiated: 01/15/2015 Goal Status: Active Ulcer/skin breakdown will have a volume reduction of 80% by week 12 Date Initiated: 01/15/2015 Goal Status: Active Ulcer/skin breakdown will heal within 14 weeks Date Initiated: 01/15/2015 Goal Status: Active Interventions: Assess patient/caregiver ability to perform ulcer/skin care regimen upon admission and as needed Assess ulceration(s) every visit Provide education on ulcer and skin care Notes: Electronic Signature(s) Signed: 02/07/2015 3:55:41 PM By: Curtis Sites Entered By: Curtis Sites on 02/07/2015 10:12:08 [name] ([id number]) -------------------------------------------------------------------------------- Patient/Caregiver Education Details Patient Name: [name] Date of Service: 02/07/2015 10:00 AM Medical Record Number: [id number] Patient Account Number: [id number] Date of Birth/Gender: 10/26/1935 (79 y.o. Male) Treating RN: Curtis Sites Primary Care Physician: Dorothey Baseman Other Clinician: Referring Physician: Dorothey Baseman Treating Physician/Extender: Rudene Re in Treatment: 3 Education Assessment Education Provided To: Patient Education Topics Provided Wound/Skin Impairment: Handouts: Other: compression as ordered and have  SNF nurse replace wrap if wrap is removed Methods: Demonstration, Explain/Verbal Responses: State content correctly Electronic  Signature(s) Signed: 02/07/2015 3:55:41 PM By: Curtis Sites Entered By: Curtis Sites on 02/07/2015 10:24:01 [name], [name] ([id number]) -------------------------------------------------------------------------------- Wound Assessment Details Patient Name: [name], [name] Date of Service: 02/07/2015 10:00 AM Medical Record Number: [id number] Patient Account Number: [id number] Date of Birth/Sex: 1936/05/12 (80 y.o. Male) Treating RN: Curtis Sites Primary Care Physician: Terance Hart, DAVID Other Clinician: Referring Physician: Terance Hart, DAVID Treating Physician/Extender: Rudene Re in Treatment: 3 Wound Status Wound Number: 5 Primary Venous Leg Ulcer Etiology: Wound Location: Right Lower Leg - Medial Wound Status: Open Wounding Event: Gradually Appeared Comorbid Cataracts, Anemia, Hypertension, Date Acquired: 11/13/2014 History: Osteoarthritis Weeks Of Treatment: 3 Clustered Wound: No Photos Photo Uploaded By: Curtis Sites on 02/07/2015 15:39:04 Wound Measurements Length: (cm) 12.4 Width: (cm) 7 Depth: (cm) 0.4 Area: (cm) 68.173 Volume: (cm) 27.269 % Reduction in Area: 33.5% % Reduction in Volume: 11.3% Epithelialization: None Tunneling: No Undermining: No Wound Description Full Thickness Without Exposed Classification: Support Structures Wound Margin: Distinct, outline attached Exudate Large Amount: Exudate Type: Serosanguineous Exudate Color: red, brown Foul Odor After Cleansing: Yes Due to Product Use: No Wound Bed Granulation Amount: Large (67-100%) Exposed Structure Granulation Quality: Pink, Pale Fascia Exposed: No Necrotic Amount: Small (1-33%) Fat Layer Exposed: No [name], [name] ([id number]) Necrotic Quality: Adherent Slough Tendon Exposed: No Muscle Exposed: No Joint Exposed: No Bone Exposed: No Limited to Skin Breakdown Periwound Skin Texture Texture Color No Abnormalities Noted: No No Abnormalities Noted: No Callus:  No Atrophie Blanche: No Crepitus: No Cyanosis: No Excoriation: No Ecchymosis: No Fluctuance: No Erythema: No Friable: No Hemosiderin Staining: No Induration: No Mottled: Yes Localized Edema: Yes Pallor: No Rash: No Rubor: No Scarring: No Temperature / Pain Moisture Temperature: No Abnormality No Abnormalities Noted: No Dry / Scaly: No Maceration: No Moist: Yes Wound Preparation Ulcer Cleansing: Other: soap and water, Topical Anesthetic Applied: Other: lidocaine 4%, Treatment Notes Wound #5 (Right, Medial Lower Leg) 1. Cleansed with: Cleanse wound with antibacterial soap and water 2. Anesthetic Topical Lidocaine 4% cream to wound bed prior to debridement 3. Peri-wound Care: Barrier cream 4. Dressing Applied: Hydrafera Blue Other dressing (specify in notes) 5. Secondary Dressing Applied ABD Pad 7. Secured with Henriette Combs to Right Lower Extremity Notes xtrasorb [name], [name] ([id number]) Electronic Signature(s) Signed: 02/07/2015 3:55:41 PM By: Curtis Sites Entered By: Curtis Sites on 02/07/2015 10:10:49 [name], [name] ([id number]) -------------------------------------------------------------------------------- Vitals Details Patient Name: [name], [name] Date of Service: 02/07/2015 10:00 AM Medical Record Number: [id number] Patient Account Number: [id number] Date of Birth/Sex: 1935/09/22 (79 y.o. Male) Treating RN: Curtis Sites Primary Care Physician: Terance Hart, DAVID Other Clinician: Referring Physician: Terance Hart, DAVID Treating Physician/Extender: Rudene Re in Treatment: 3 Vital Signs Time Taken: 10:00 Temperature (F): 98.2 Height (in): 68 Pulse (bpm): 62 Respiratory Rate (breaths/min): 18 Blood Pressure (mmHg): 147/61 Reference Range: 80 - 120 mg / dl Electronic Signature(s) Signed: 02/07/2015 3:55:41 PM By: Curtis Sites Entered By: Curtis Sites on 02/07/2015 10:00:02

## 2015-02-14 ENCOUNTER — Encounter: Payer: Medicare Other | Admitting: Surgery

## 2015-02-14 DIAGNOSIS — L97313 Non-pressure chronic ulcer of right ankle with necrosis of muscle: Secondary | ICD-10-CM | POA: Diagnosis not present

## 2015-02-14 NOTE — Progress Notes (Signed)
CHAROD, SLAWINSKI ([id number]) Visit Report for 02/14/2015 Arrival Information Details Patient Name: [name], [name] Date of Service: 02/14/2015 9:30 AM Medical Record Number: [id number] Patient Account Number: [id number] Date of Birth/Sex: 01-Mar-1936 (79 y.o. Male) Treating RN: Clover Mealy, RN, BSN, [place] Sink Primary Care Physician: Terance Hart, DAVID Other Clinician: Referring Physician: Terance Hart, DAVID Treating Physician/Extender: Rudene Re in Treatment: 4 Visit Information History Since Last Visit Added or deleted any medications: No Patient Arrived: Wheel Chair Any new allergies or adverse reactions: No Arrival Time: 09:42 Had a fall or experienced change in No activities of daily living that may affect Accompanied By: SELF risk of falls: Transfer Assistance: Hoyer Lift Signs or symptoms of abuse/neglect since last No Patient Identification Verified: Yes visito Secondary Verification Process Yes Hospitalized since last visit: No Completed: Has Dressing in Place as Prescribed: Yes Patient Requires Transmission-Based No Has Compression in Place as Prescribed: Yes Precautions: Pain Present Now: No Patient Has Alerts: No Electronic Signature(s) Signed: 02/14/2015 9:46:23 AM By: Elpidio Eric BSN, RN Entered By: Elpidio Eric on 02/14/2015 09:46:23 [name] ([id number]) -------------------------------------------------------------------------------- Clinic Level of Care Assessment Details Patient Name: [name] Date of Service: 02/14/2015 9:30 AM Medical Record Number: [id number] Patient Account Number: [id number] Date of Birth/Sex: 11-23-1935 (79 y.o. Male) Treating RN: Clover Mealy, RN, BSN, [place] Primary Care Physician: Terance Hart, DAVID Other Clinician: Referring Physician: Terance Hart, DAVID Treating Physician/Extender: Rudene Re in Treatment: 4 Clinic Level of Care Assessment Items TOOL 4 Quantity Score []  - Use when only an EandM is performed on  FOLLOW-UP visit 0 ASSESSMENTS - Nursing Assessment / Reassessment X - Reassessment of Co-morbidities (includes updates in patient status) 1 10 X - Reassessment of Adherence to Treatment Plan 1 5 ASSESSMENTS - Wound and Skin Assessment / Reassessment X - Simple Wound Assessment / Reassessment - one wound 1 5 []  - Complex Wound Assessment / Reassessment - multiple wounds 0 []  - Dermatologic / Skin Assessment (not related to wound area) 0 ASSESSMENTS - Focused Assessment []  - Circumferential Edema Measurements - multi extremities 0 []  - Nutritional Assessment / Counseling / Intervention 0 X - Lower Extremity Assessment (monofilament, tuning fork, pulses) 1 5 []  - Peripheral Arterial Disease Assessment (using hand held doppler) 0 ASSESSMENTS - Ostomy and/or Continence Assessment and Care []  - Incontinence Assessment and Management 0 []  - Ostomy Care Assessment and Management (repouching, etc.) 0 PROCESS - Coordination of Care X - Simple Patient / Family Education for ongoing care 1 15 []  - Complex (extensive) Patient / Family Education for ongoing care 0 []  - Staff obtains Chiropractor, Records, Test Results / Process Orders 0 X - Staff telephones HHA, Nursing Homes / Clarify orders / etc 1 10 []  - Routine Transfer to another Facility (non-emergent condition) 0 [name], [name] ([id number]) []  - Routine Hospital Admission (non-emergent condition) 0 []  - New Admissions / Manufacturing engineer / Ordering NPWT, Apligraf, etc. 0 []  - Emergency Hospital Admission (emergent condition) 0 []  - Simple Discharge Coordination 0 []  - Complex (extensive) Discharge Coordination 0 PROCESS - Special Needs []  - Pediatric / Minor Patient Management 0 []  - Isolation Patient Management 0 []  - Hearing / Language / Visual special needs 0 []  - Assessment of Community assistance (transportation, D/C planning, etc.) 0 []  - Additional assistance / Altered mentation 0 []  - Support Surface(s) Assessment (bed,  cushion, seat, etc.) 0 INTERVENTIONS - Wound Cleansing / Measurement []  - Simple Wound Cleansing - one wound 0 X - Complex Wound Cleansing - multiple wounds 1  5 X - Wound Imaging (photographs - any number of wounds) 1 5 []  - Wound Tracing (instead of photographs) 0 []  - Simple Wound Measurement - one wound 0 X - Complex Wound Measurement - multiple wounds 1 5 INTERVENTIONS - Wound Dressings []  - Small Wound Dressing one or multiple wounds 0 X - Medium Wound Dressing one or multiple wounds 1 15 []  - Large Wound Dressing one or multiple wounds 0 []  - Application of Medications - topical 0 []  - Application of Medications - injection 0 INTERVENTIONS - Miscellaneous []  - External ear exam 0 [name], [name] ([id number]) X - Specimen Collection (cultures, biopsies, blood, body fluids, etc.) 1 5 X - Specimen(s) / Culture(s) sent or taken to Lab for analysis 1 5 X - Patient Transfer (multiple staff / Michiel Sites Lift / Similar devices) 1 10 []  - Simple Staple / Suture removal (25 or less) 0 []  - Complex Staple / Suture removal (26 or more) 0 []  - Hypo / Hyperglycemic Management (close monitor of Blood Glucose) 0 []  - Ankle / Brachial Index (ABI) - do not check if billed separately 0 X - Vital Signs 1 5 Has the patient been seen at the hospital within the last three years: Yes Total Score: 105 Level Of Care: New/Established - Level 3 Electronic Signature(s) Signed: 02/14/2015 10:12:22 AM By: Elpidio Eric BSN, RN Entered By: Elpidio Eric on 02/14/2015 10:12:21 [name] ([id number]) -------------------------------------------------------------------------------- Encounter Discharge Information Details Patient Name: [name] Date of Service: 02/14/2015 9:30 AM Medical Record Number: [id number] Patient Account Number: [id number] Date of Birth/Sex: 11-20-1935 (79 y.o. Male) Treating RN: Clover Mealy, RN, BSN, [place] Sink Primary Care Physician: Terance Hart, DAVID Other Clinician: Referring  Physician: Terance Hart, DAVID Treating Physician/Extender: Rudene Re in Treatment: 4 Encounter Discharge Information Items Discharge Pain Level: 0 Discharge Condition: Stable Ambulatory Status: Wheelchair Discharge Destination: Nursing Home Transportation: Other Accompanied By: self Schedule Follow-up Appointment: No Medication Reconciliation completed and provided to Patient/Care No Leeza Heiner: Provided on Clinical Summary of Care: 02/14/2015 Form Type Recipient Paper Patient [facility] Electronic Signature(s) Signed: 02/14/2015 10:39:32 AM By: Elpidio Eric BSN, RN Previous Signature: 02/14/2015 10:32:28 AM Version By: Gwenlyn Perking Entered By: Elpidio Eric on 02/14/2015 10:39:32 [name] ([id number]) -------------------------------------------------------------------------------- Lower Extremity Assessment Details Patient Name: [name], [name] Date of Service: 02/14/2015 9:30 AM Medical Record Number: [id number] Patient Account Number: [id number] Date of Birth/Sex: 1935/09/12 (79 y.o. Male) Treating RN: Clover Mealy, RN, BSN, [place] Sink Primary Care Physician: Terance Hart, DAVID Other Clinician: Referring Physician: Terance Hart, DAVID Treating Physician/Extender: Rudene Re in Treatment: 4 Edema Assessment Assessed: [Left: No] [Right: No] E[Left: dema] [Right: :] Calf Left: Right: Point of Measurement: 40 cm From Medial Instep cm 47.1 cm Ankle Left: Right: Point of Measurement: 10 cm From Medial Instep cm 28.5 cm Vascular Assessment Pulses: Posterior Tibial Dorsalis Pedis Palpable: [Right:Yes] Extremity colors, hair growth, and conditions: Extremity Color: [Right:Hyperpigmented] Toe Nail Assessment Left: Right: Thick: Yes Discolored: Yes Deformed: Yes Improper Length and Hygiene: Yes Electronic Signature(s) Signed: 02/14/2015 9:47:09 AM By: Elpidio Eric BSN, RN Entered By: Elpidio Eric on 02/14/2015 09:47:09 [name]  ([id number]) -------------------------------------------------------------------------------- Multi Wound Chart Details Patient Name: [name] Date of Service: 02/14/2015 9:30 AM Medical Record Number: [id number] Patient Account Number: [id number] Date of Birth/Sex: 03/10/1936 (79 y.o. Male) Treating RN: Clover Mealy, RN, BSN, [place] Sink Primary Care Physician: Terance Hart, DAVID Other Clinician: Referring Physician: Terance Hart, DAVID Treating Physician/Extender: Rudene Re in Treatment: 4 Vital Signs Height(in): 68 Pulse(bpm): 67 Weight(lbs): Blood Pressure 151/55 (  mmHg): Body Mass Index(BMI): Temperature(F): 97.9 Respiratory Rate 18 (breaths/min): Photos: [5:No Photos] [N/A:N/A] Wound Location: [5:Right Lower Leg - Medial] [N/A:N/A] Wounding Event: [5:Gradually Appeared] [N/A:N/A] Primary Etiology: [5:Venous Leg Ulcer] [N/A:N/A] Comorbid History: [5:Cataracts, Anemia, Hypertension, Osteoarthritis] [N/A:N/A] Date Acquired: [5:11/13/2014] [N/A:N/A] Weeks of Treatment: [5:4] [N/A:N/A] Wound Status: [5:Open] [N/A:N/A] Measurements L x W x D 13x7.5x0.2 [N/A:N/A] (cm) Area (cm) : [5:76.576] [N/A:N/A] Volume (cm) : [5:15.315] [N/A:N/A] % Reduction in Area: [5:25.30%] [N/A:N/A] % Reduction in Volume: 50.20% [N/A:N/A] Classification: [5:Full Thickness Without Exposed Support Structures] [N/A:N/A] Exudate Amount: [5:Large] [N/A:N/A] Exudate Type: [5:Serosanguineous] [N/A:N/A] Exudate Color: [5:red, brown] [N/A:N/A] Foul Odor After [5:Yes] [N/A:N/A] Cleansing: Odor Anticipated Due to No [N/A:N/A] Product Use: Wound Margin: [5:Distinct, outline attached] [N/A:N/A] Granulation Amount: [5:Medium (34-66%)] [N/A:N/A] Granulation Quality: [5:Pink, Pale] [N/A:N/A] Necrotic Amount: [5:Medium (34-66%)] [N/A:N/A] Exposed Structures: Fascia: No N/A N/A Fat: No Tendon: No Muscle: No Joint: No Bone: No Limited to Skin Breakdown Epithelialization: None N/A N/A Periwound Skin  Texture: Edema: Yes N/A N/A Excoriation: No Induration: No Callus: No Crepitus: No Fluctuance: No Friable: No Rash: No Scarring: No Periwound Skin Moist: Yes N/A N/A Moisture: Maceration: No Dry/Scaly: No Periwound Skin Color: Mottled: Yes N/A N/A Atrophie Blanche: No Cyanosis: No Ecchymosis: No Erythema: No Hemosiderin Staining: No Pallor: No Rubor: No Temperature: No Abnormality N/A N/A Tenderness on No N/A N/A Palpation: Wound Preparation: Ulcer Cleansing: Other: N/A N/A soap and water Topical Anesthetic Applied: Other: lidocaine 4% Treatment Notes Electronic Signature(s) Signed: 02/14/2015 10:03:00 AM By: Elpidio Eric BSN, RN Entered By: Elpidio Eric on 02/14/2015 10:03:00 [name], [name] ([id number]) -------------------------------------------------------------------------------- Multi-Disciplinary Care Plan Details Patient Name: [name], [name] Date of Service: 02/14/2015 9:30 AM Medical Record Number: [id number] Patient Account Number: [id number] Date of Birth/Sex: 05/17/1936 (79 y.o. Male) Treating RN: Clover Mealy, RN, BSN, [place] Sink Primary Care Physician: Terance Hart, DAVID Other Clinician: Referring Physician: Terance Hart, DAVID Treating Physician/Extender: Rudene Re in Treatment: 4 Active Inactive Orientation to the Wound Care Program Nursing Diagnoses: Knowledge deficit related to the wound healing center program Goals: Patient/caregiver will verbalize understanding of the Wound Healing Center Program Date Initiated: 01/15/2015 Goal Status: Active Interventions: Provide education on orientation to the wound center Notes: Venous Leg Ulcer Nursing Diagnoses: Knowledge deficit related to disease process and management Potential for venous Insuffiency (use before diagnosis confirmed) Goals: Patient will maintain optimal edema control Date Initiated: 01/15/2015 Goal Status: Active Patient/caregiver will verbalize understanding of disease process and  disease management Date Initiated: 01/15/2015 Goal Status: Active Verify adequate tissue perfusion prior to therapeutic compression application Date Initiated: 01/15/2015 Goal Status: Active Interventions: Assess peripheral edema status every visit. Compression as ordered Provide education on venous insufficiency [name], [name] ([id number]) Treatment Activities: Test ordered outside of clinic : 02/14/2015 Therapeutic compression applied : 02/14/2015 Venous Duplex Doppler : 02/14/2015 Notes: Wound/Skin Impairment Nursing Diagnoses: Impaired tissue integrity Knowledge deficit related to ulceration/compromised skin integrity Goals: Patient/caregiver will verbalize understanding of skin care regimen Date Initiated: 01/15/2015 Goal Status: Active Ulcer/skin breakdown will have a volume reduction of 30% by week 4 Date Initiated: 01/15/2015 Goal Status: Active Ulcer/skin breakdown will have a volume reduction of 50% by week 8 Date Initiated: 01/15/2015 Goal Status: Active Ulcer/skin breakdown will have a volume reduction of 80% by week 12 Date Initiated: 01/15/2015 Goal Status: Active Ulcer/skin breakdown will heal within 14 weeks Date Initiated: 01/15/2015 Goal Status: Active Interventions: Assess patient/caregiver ability to perform ulcer/skin care regimen upon admission and as needed Assess ulceration(s) every visit Provide education on ulcer and skin  care Notes: Electronic Signature(s) Signed: 02/14/2015 10:02:51 AM By: Elpidio Eric BSN, RN Entered By: Elpidio Eric on 02/14/2015 10:02:51 [name], [name] ([id number]) -------------------------------------------------------------------------------- Pain Assessment Details Patient Name: [name], [name] Date of Service: 02/14/2015 9:30 AM Medical Record Number: [id number] Patient Account Number: [id number] Date of Birth/Sex: [birth year] (79 y.o. Male) Treating RN: Clover Mealy, RN, BSN, [place] Sink Primary Care Physician: Terance Hart, DAVID Other  Clinician: Referring Physician: Dorothey Baseman Treating Physician/Extender: Rudene Re in Treatment: 4 Active Problems Location of Pain Severity and Description of Pain Patient Has Paino No Site Locations Pain Management and Medication Current Pain Management: Electronic Signature(s) Signed: 02/14/2015 9:46:31 AM By: Elpidio Eric BSN, RN Entered By: Elpidio Eric on 02/14/2015 09:46:31 [name] ([id number]) -------------------------------------------------------------------------------- Patient/Caregiver Education Details Patient Name: [name] Date of Service: 02/14/2015 9:30 AM Medical Record Number: [id number] Patient Account Number: [id number] Date of Birth/Gender: 08-15-1935 (79 y.o. Male) Treating RN: Clover Mealy, RN, BSN, [place] Sink Primary Care Physician: Terance Hart, DAVID Other Clinician: Referring Physician: Terance Hart DAVID Treating Physician/Extender: Rudene Re in Treatment: 4 Education Assessment Education Provided To: Patient Education Topics Provided Venous: Methods: Explain/Verbal Responses: State content correctly Welcome To The Wound Care Center: Methods: Explain/Verbal Wound/Skin Impairment: Methods: Explain/Verbal Responses: State content correctly Electronic Signature(s) Signed: 02/14/2015 10:39:50 AM By: Elpidio Eric BSN, RN Entered By: Elpidio Eric on 02/14/2015 10:39:50 [name], [name] ([id number]) -------------------------------------------------------------------------------- Wound Assessment Details Patient Name: [name] Date of Service: 02/14/2015 9:30 AM Medical Record Number: [id number] Patient Account Number: [id number] Date of Birth/Sex: July 11, 1935 (79 y.o. Male) Treating RN: Clover Mealy, RN, BSN, [place] Primary Care Physician: Terance Hart, DAVID Other Clinician: Referring Physician: Terance Hart, DAVID Treating Physician/Extender: Rudene Re in Treatment: 4 Wound Status Wound Number: 5 Primary Venous Leg  Ulcer Etiology: Wound Location: Right Lower Leg - Medial Wound Status: Open Wounding Event: Gradually Appeared Comorbid Cataracts, Anemia, Hypertension, Date Acquired: 11/13/2014 History: Osteoarthritis Weeks Of Treatment: 4 Clustered Wound: No Photos Photo Uploaded By: Elpidio Eric on 02/14/2015 17:17:57 Wound Measurements Length: (cm) 13 Width: (cm) 7.5 Depth: (cm) 0.2 Area: (cm) 76.576 Volume: (cm) 15.315 % Reduction in Area: 25.3% % Reduction in Volume: 50.2% Epithelialization: None Tunneling: No Wound Description Full Thickness Without Exposed Classification: Support Structures Wound Margin: Distinct, outline attached Exudate Large Amount: Exudate Type: Serosanguineous Exudate Color: red, brown Foul Odor After Cleansing: Yes Due to Product Use: No Wound Bed Granulation Amount: Medium (34-66%) Exposed Structure Granulation Quality: Pink, Pale Fascia Exposed: No Necrotic Amount: Medium (34-66%) Fat Layer Exposed: No [name], [name] ([id number]) Necrotic Quality: Adherent Slough Tendon Exposed: No Muscle Exposed: No Joint Exposed: No Bone Exposed: No Limited to Skin Breakdown Periwound Skin Texture Texture Color No Abnormalities Noted: No No Abnormalities Noted: No Callus: No Atrophie Blanche: No Crepitus: No Cyanosis: No Excoriation: No Ecchymosis: No Fluctuance: No Erythema: No Friable: No Hemosiderin Staining: No Induration: No Mottled: Yes Localized Edema: Yes Pallor: No Rash: No Rubor: No Scarring: No Temperature / Pain Moisture Temperature: No Abnormality No Abnormalities Noted: No Dry / Scaly: No Maceration: No Moist: Yes Wound Preparation Ulcer Cleansing: Other: soap and water, Topical Anesthetic Applied: Other: lidocaine 4%, Treatment Notes Wound #5 (Right, Medial Lower Leg) 3. Peri-wound Care: Barrier cream 4. Dressing Applied: Hydrafera Blue 5. Secondary Dressing Applied ABD Pad 7. Secured with Henriette Combs to Right  Lower Extremity Notes xtrasorb, drawtex Electronic Signature(s) Signed: 02/14/2015 9:55:05 AM By: Elpidio Eric BSN, RN Entered By: Elpidio Eric on 02/14/2015 09:55:05 [name], [name] ([id number]) -------------------------------------------------------------------------------- Vitals Details Patient Name: [name]  P. Date of Service: 02/14/2015 9:30 AM Medical Record Number: [id number] Patient Account Number: [id number] Date of Birth/Sex: [birth year] (79 y.o. Male) Treating RN: Clover Mealy, RN, BSN,  Sink Primary Care Physician: Terance Hart, DAVID Other Clinician: Referring Physician: Terance Hart, DAVID Treating Physician/Extender: Rudene Re in Treatment: 4 Vital Signs Time Taken: 09:44 Temperature (F): 97.9 Height (in): 68 Pulse (bpm): 67 Respiratory Rate (breaths/min): 18 Blood Pressure (mmHg): 151/55 Reference Range: 80 - 120 mg / dl Electronic Signature(s) Signed: 02/14/2015 9:45:24 AM By: Elpidio Eric BSN, RN Entered By: Elpidio Eric on 02/14/2015 09:45:24

## 2015-02-15 ENCOUNTER — Other Ambulatory Visit
Admission: RE | Admit: 2015-02-15 | Discharge: 2015-02-15 | Disposition: A | Discharge status: Home / Self Care | Payer: Medicare Other | Source: Ambulatory Visit | Attending: Surgery | Admitting: Surgery

## 2015-02-15 DIAGNOSIS — L089 Local infection of the skin and subcutaneous tissue, unspecified: Secondary | ICD-10-CM | POA: Diagnosis present

## 2015-02-15 NOTE — Progress Notes (Signed)
SHAMELL, LAVALLEE ([id number]) Visit Report for 02/14/2015 Chief Complaint Document Details Patient Name: [name], [name] Date of Service: 02/14/2015 9:30 AM Medical Record Number: [id number] Patient Account Number: [id number] Date of Birth/Sex: May 21, 1936 (79 y.o. Male) Treating RN: Primary Care Physician: Dorothey Baseman Other Clinician: Referring Physician: Terance Hart, DAVID Treating Physician/Extender: Rudene Re in Treatment: 4 Information Obtained from: Patient Chief Complaint Patient presents for treatment of an open ulcer due to venous insufficiency. The patient has had a open wound on his right medial ankle for at least 3 years and was last seen in the wound clinic in February 2015. He was lost to follow-up after that. Electronic Signature(s) Signed: 02/14/2015 10:14:24 AM By: Evlyn Kanner MD, FACS Entered By: Evlyn Kanner on 02/14/2015 10:14:24 MOHAMUD, SCHRADER ([id number]) -------------------------------------------------------------------------------- Debridement Details Patient Name: [name], MERRIN. Date of Service: 02/14/2015 9:30 AM Medical Record Number: [id number] Patient Account Number: [id number] Date of Birth/Sex: 10/28/1935 (79 y.o. Male) Treating RN: Primary Care Physician: Terance Hart, DAVID Other Clinician: Referring Physician: Terance Hart, DAVID Treating Physician/Extender: Rudene Re in Treatment: 4 Debridement Performed for Wound #5 Right,Medial Lower Leg Assessment: Performed By: Physician Tristan Schroeder., MD Debridement: Debridement Pre-procedure Yes Verification/Time Out Taken: Start Time: 10:05 Pain Control: Lidocaine 5% topical ointment Level: Skin/Subcutaneous Tissue Total Area Debrided (L x 3 (cm) x 2 (cm) = 6 (cm) W): Tissue and other Fibrin/Slough, Subcutaneous material debrided: Instrument: Curette Bleeding: Minimum Hemostasis Achieved: Pressure End Time: 10:08 Procedural Pain: 0 Post Procedural Pain:  0 Response to Treatment: Procedure was tolerated well Post Debridement Measurements of Total Wound Length: (cm) 13 Width: (cm) 705 Depth: (cm) 0.2 Volume: (cm) 6789.381 Notes deep tissue culture taken and sent for culture and sensitivity. Electronic Signature(s) Signed: 02/14/2015 10:14:18 AM By: Evlyn Kanner MD, FACS Entered By: Evlyn Kanner on 02/14/2015 10:14:18 WARN, KAJIWARA ([id number]) -------------------------------------------------------------------------------- HPI Details Patient Name: [name], [name] Date of Service: 02/14/2015 9:30 AM Medical Record Number: [id number] Patient Account Number: [id number] Date of Birth/Sex: 10-Dec-1935 (79 y.o. Male) Treating RN: Primary Care Physician: Dorothey Baseman Other Clinician: Referring Physician: Terance Hart, DAVID Treating Physician/Extender: Rudene Re in Treatment: 4 History of Present Illness Location: right lower extremity ulceration Quality: Patient reports experiencing a dull pain to affected area(s). Severity: Patient states wound are getting worse. 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 Modifying Factors: Consults to this date include:has received treatment in the wound center in the past but stopped coming here since February 2015 Associated Signs and Symptoms: Patient reports having difficulty standing for long periods. HPI Description: 79 year old male who is known to have progressive weakness and has been in a nursing home for a while has had chronic lower extremity edema both legs and a large open wound on the right lower extremity which is has at least for about 3-4 years. The patient was seen in the wound  clinic before and has been treated until February 2015 when he was lost to follow-up. Past medical history is significant for hypertension, gout, venous stasis ulcers, Parkinson's [name], constipation. He's not been a diabetic but his last hemoglobin A1c was 6 in March 2015. There is documentation that he's had endovenous ablation of bilateral  varicose veins but we have no documentation about this and this may be done at least 4-5 years ago. 01/22/2015 -- he has his vascular test is scheduled for this afternoon. 01/29/2015 -- the patient's vascular test was canceled last week because he was unable to get onto the examining bed at [facility]. His Unna's boot was not applied either and the patient had a dressing placed by home health. Today when his dressing was removed we found a lot of maggots in his right lower extremity. 02/07/2015 -- Was seen in the vascular office by the PA and she noted that a bilateral venous duplex study did not show any DVT, SVT or reflux bilaterally. The patient was advised to continue with Unna's boot and would at some stage to require graduated compression stockings of the 20-30 mmHg variety. In the future lymphedema pumps would also be considered. 02/14/2015 -- his dressing is smelling quite a bit and there is a discoloration of the wound suggestive of an infection. Deep tissue cultures will be taken today. Electronic Signature(s) Signed: 02/14/2015 10:14:54 AM By: Evlyn Kanner MD, FACS Entered By: Evlyn Kanner on 02/14/2015 10:14:54 JYMIR, DUNAJ ([id number]) -------------------------------------------------------------------------------- Physical Exam Details Patient Name: [name], [name] Date of Service: 02/14/2015 9:30 AM Medical Record Number: [id number] Patient Account Number: [id number] Date of Birth/Sex: [birth year] (79 y.o. Male) Treating RN: Primary Care Physician: Dorothey Baseman Other Clinician: Referring Physician: Terance Hart,  DAVID Treating Physician/Extender: Rudene Re in Treatment: 4 Constitutional . Pulse regular. Respirations normal and unlabored. Afebrile. . Eyes Nonicteric. Reactive to light. Ears, Nose, Mouth, and Throat Lips, teeth, and gums WNL.Marland Kitchen Moist mucosa without lesions . Neck supple and nontender. No palpable supraclavicular or cervical adenopathy. Normal sized without goiter. Respiratory WNL. No retractions.. Cardiovascular Pedal Pulses WNL. No clubbing, cyanosis or edema. Chest Breasts symmetical and no nipple discharge.. Breast tissue WNL, no masses, lumps, or tenderness.. Lymphatic No adneopathy. No adenopathy. No adenopathy. Musculoskeletal Adexa without tenderness or enlargement.. Digits and nails w/o clubbing, cyanosis, infection, petechiae, ischemia, or inflammatory conditions.. Integumentary (Hair, Skin) No suspicious lesions. No crepitus or fluctuance. No peri-wound warmth or erythema. No masses.Marland Kitchen Psychiatric Judgement and insight Intact.. No evidence of depression, anxiety, or agitation.. Notes The wound has significant amount of odor and after washing it with saline and debriding some of the slough deep tissue cultures were taken and sent for culture and sensitivity. Electronic Signature(s) Signed: 02/14/2015 10:15:30 AM By: Evlyn Kanner MD, FACS Entered By: Evlyn Kanner on 02/14/2015 10:15:29 KESHAV, WINEGAR ([id number]) -------------------------------------------------------------------------------- Physician Orders Details Patient Name: [name], [name] Date of Service: 02/14/2015 9:30 AM Medical Record Number: [id number] Patient Account Number: [id number] Date of Birth/Sex: [birth year] (79 y.o. Male) Treating RN: Clover Mealy, RN, BSN, Colwell Sink Primary Care Physician: Terance Hart, DAVID Other Clinician: Referring Physician: Terance Hart DAVID Treating Physician/Extender: Rudene Re in Treatment: 4 Verbal / Phone Orders: Yes Clinician: Afful, RN, BSN,  Rita Read Back and Verified: Yes Diagnosis Coding Wound Cleansing Wound #5 Right,Medial Lower Leg o Cleanse wound with mild soap and water o May shower with protection. o No tub bath. Skin Barriers/Peri-Wound Care Wound #5 Right,Medial Lower Leg o Barrier cream Primary Wound Dressing Wound #5 Right,Medial Lower Leg o Hydrafera Blue Secondary Dressing Wound #5 Right,Medial Lower Leg o ABD pad o XtraSorb o Drawtex Dressing Change Frequency Wound #5 Right,Medial Lower Leg o Change dressing every week o Other: - As needed for drainage Follow-up Appointments Wound #5 Right,Medial Lower Leg o Return Appointment in 1 week. Edema Control Wound #5  Right,Medial Lower Leg o Unna Boot to Right Lower Extremity Additional Orders / Instructions Wound #5 Right,Medial Lower Leg SHAKIR, PETROSINO ([id number]) o Other: - SNF nurse may change unna wrap as needed for copius drainage or odor. SNF nurse is to call [facility] prior to changing unna wrap for instruction r/t improper unna wrap Please call us at [phone] to be sure wrap is applied correctly prior to changing wrap. Speak with Selena Batten or Mardene Celeste Medications-please add to medication list. Wound #5 Right,Medial Lower Leg o P.O. Antibiotics - Doxy 100mg  BID x7days Electronic Signature(s) Signed: 02/14/2015 10:13:42 AM By: Elpidio Eric BSN, RN Signed: 02/14/2015 1:29:04 PM By: Evlyn Kanner MD, FACS Previous Signature: 02/14/2015 10:10:45 AM Version By: Elpidio Eric BSN, RN Entered By: Elpidio Eric on 02/14/2015 10:13:42 DEQUANN, VANDERVELDEN ([id number]) -------------------------------------------------------------------------------- Problem List Details Patient Name: [name], [name] Date of Service: 02/14/2015 9:30 AM Medical Record Number: [id number] Patient Account Number: [id number] Date of Birth/Sex: 08-25-1935 (79 y.o. 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 Date of Service: 02/14/2015 9:30 AM Medical Record Number: [id number] Patient Account Number: [id number] Date of Birth/Sex: 04-11-1936 (79 y.o. Male) Treating RN: Primary Care Physician: Dorothey Baseman Other Clinician: Referring Physician: Terance Hart, DAVID Treating Physician/Extender: Rudene Re in Treatment: 4 Subjective Chief Complaint Information obtained from Patient Patient presents for treatment of an open ulcer due to venous insufficiency. The patient has had a open wound on his right medial ankle for at least 3 years and was last seen in the wound clinic in February 2015. He was lost to follow-up after that. History of Present Illness (HPI) The following HPI elements were documented for the patient's wound: Location: right lower extremity ulceration Quality: Patient reports experiencing a dull pain to affected area(s). Severity: Patient states wound are getting worse. 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 Modifying Factors: Consults to this date include:has received treatment in the wound center in the past but stopped coming here since February 2015 Associated Signs and Symptoms: Patient reports having difficulty standing for long periods. 79 year old male who is known to have progressive weakness and has been in a nursing home for a while has had chronic lower extremity edema both legs and a large open wound on the right lower extremity which is has at least for about 3-4 years. The patient was seen in the wound clinic before and has been treated until February 2015 when he was lost to follow-up. Past medical history is significant for hypertension, gout, venous stasis ulcers, Parkinson's [name], constipation. He's not been a diabetic  but his last hemoglobin A1c was 6 in March 2015. There is documentation that he's had endovenous ablation of bilateral varicose veins but we have no documentation about this and this may be done at least 4-5 years ago. 01/22/2015 -- he has his vascular test is scheduled for this afternoon. 01/29/2015 -- the patient's vascular test was canceled last week because he was unable to get onto the examining bed at [facility]. His Unna's boot was not applied either and the patient had a dressing placed by home health. Today when his dressing was removed we found a lot of maggots in his right lower extremity. 02/07/2015 -- Was seen in the vascular office by the PA and she noted that a bilateral venous duplex study did not show any DVT, SVT or reflux bilaterally. The patient was advised to continue with Unna's boot and would at some stage to require graduated compression stockings of the 20-30 mmHg variety. In the future lymphedema pumps would also be considered. 02/14/2015 -- his dressing is smelling quite a  bit and there is a discoloration of the wound suggestive of an SAMAN, UMSTEAD. ([id number]) infection. Deep tissue cultures will be taken today. Objective Constitutional Pulse regular. Respirations normal and unlabored. Afebrile. Vitals Time Taken: 9:44 AM, Height: 68 in, Temperature: 97.9 F, Pulse: 67 bpm, Respiratory Rate: 18 breaths/min, Blood Pressure: 151/55 mmHg. Eyes Nonicteric. Reactive to light. Ears, Nose, Mouth, and Throat Lips, teeth, and gums WNL.Marland Kitchen Moist mucosa without lesions . Neck supple and nontender. No palpable supraclavicular or cervical adenopathy. Normal sized without goiter. Respiratory WNL. No retractions.. Cardiovascular Pedal Pulses WNL. No clubbing, cyanosis or edema. Chest Breasts symmetical and no nipple discharge.. Breast tissue WNL, no masses, lumps, or tenderness.. Lymphatic No adneopathy. No adenopathy. No adenopathy. Musculoskeletal Adexa without tenderness or enlargement.. Digits and nails w/o clubbing, cyanosis, infection, petechiae, ischemia, or inflammatory conditions.Marland Kitchen Psychiatric Judgement and insight Intact.. No evidence of depression, anxiety, or agitation.. General Notes: The wound has significant amount of odor and after washing it with saline and debriding some of the slough deep tissue cultures were taken and sent for culture and sensitivity. KHALEB, BROZ ([id number]) Integumentary (Hair, Skin) No suspicious lesions. No crepitus or fluctuance. No peri-wound warmth or erythema. No masses.. Wound #5 status is Open. Original cause of wound was Gradually Appeared. The wound is located on the Right,Medial Lower Leg. The wound measures 13cm length x 7.5cm width x 0.2cm depth; 76.576cm^2 area and 15.315cm^3 volume. The wound is limited to skin breakdown. There is no tunneling noted. There is a large amount of serosanguineous drainage noted. The wound margin is distinct with the outline attached to the wound base. There is medium  (34-66%) pink, pale granulation within the wound bed. There is a medium (34-66%) amount of necrotic tissue within the wound bed including Adherent Slough. The periwound skin appearance exhibited: Localized Edema, Moist, Mottled. The periwound skin appearance did not exhibit: Callus, Crepitus, Excoriation, Fluctuance, Friable, Induration, Rash, Scarring, Dry/Scaly, Maceration, Atrophie Blanche, Cyanosis, Ecchymosis, Hemosiderin Staining, Pallor, Rubor, Erythema. Periwound temperature was noted as No Abnormality. 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 He is on no antibiotics for his COPD and hence I have empirically put  him on doxycycline 100 mg twice a day for 2 weeks. Procedures Wound #5 Wound #5 is a Venous Leg Ulcer located on the Right,Medial Lower Leg . There was a Skin/Subcutaneous Tissue Debridement ([id number]) debridement with total area of 6 sq cm performed by Devarious Pavek, Ignacia Felling., MD. with the following instrument(s): Curette including Fibrin/Slough and Subcutaneous after achieving pain control using Lidocaine 5% topical ointment. A time out was conducted prior to the start of the procedure. A Minimum amount of bleeding was controlled with Pressure. The procedure was tolerated well with a pain level of 0 throughout and a pain level of 0 following the procedure. Post Debridement Measurements: 13cm length x 705cm width x 0.2cm depth; 1439.635cm^3 volume. General Notes: deep tissue culture taken and sent for culture and sensitivity.Marland Kitchen EISA, NECAISE ([id number]) Plan Wound Cleansing: Wound #5 Right,Medial Lower  Leg: Cleanse wound with mild soap and water May shower with protection. No tub bath. Skin Barriers/Peri-Wound Care: Wound #5 Right,Medial Lower Leg: Barrier cream Primary Wound Dressing: Wound #5 Right,Medial Lower Leg: Hydrafera Blue Secondary Dressing: Wound #5 Right,Medial Lower Leg: ABD pad XtraSorb Drawtex Dressing Change Frequency: Wound #5 Right,Medial Lower Leg: Change dressing every week Other: - As needed for drainage Follow-up Appointments: Wound #5 Right,Medial Lower Leg: Return Appointment in 1 week. Edema Control: Wound #5 Right,Medial Lower Leg: Unna Boot to Right Lower Extremity Additional Orders / Instructions: Wound #5 Right,Medial Lower Leg: Other: - SNF nurse may change unna wrap as needed for copius drainage or odor. SNF nurse is to call [facility] [place] [facility] prior to changing unna wrap for instruction r/t improper unna wrap Please call us at [phone] to be sure wrap is applied correctly prior to changing wrap. Speak with Selena Batten or Mardene Celeste Medications-please add to medication list.: Wound #5 Right,Medial Lower Leg: P.O. Antibiotics - Doxy 100mg  BID x7days After taking deep tissue cultures we will continue with Hydrofera Blue appropriate drawtex and the Unna's boot. He is on no antibiotics for his COPD and hence I have empirically put him on doxycycline 100 mg twice a day for 2 weeks. ADARSH, MUNDORF ([id number]) Electronic Signature(s) Signed: 02/14/2015 10:16:58 AM By: Evlyn Kanner MD, FACS Entered By: Evlyn Kanner on 02/14/2015 10:16:57 KIWAN, GADSDEN ([id number]) -------------------------------------------------------------------------------- SuperBill Details Patient Name: [name], [name] Date of Service: 02/14/2015 Medical Record Number: [id number] Patient Account Number: [id number] Date of Birth/Sex: Dec 04, 1935 (79 y.o. 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 E66.01 Morbid (severe) obesity due to excess calories I89.0 Lymphedema, not elsewhere classified Facility Procedures CPT4 Code Description: [id number] 99213 - WOUND CARE VISIT-LEV 3 EST PT Modifier: Quantity: 1 CPT4 Code Description: [id number] 11042 - DEB SUBQ TISSUE 20 SQ CM/< ICD-10 Description Diagnosis I87.311 Chronic venous hypertension (idiopathic) with ulcer of 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 E66.01 Morbid (severe) obesity  due to excess calories I89.0 Lymphedema, not elsewhere classified Modifier: right lowe s of muscle Quantity: 1 r extremity Physician Procedures CPT4 Code Description: [id number] 11042 - WC PHYS SUBQ TISS 20 SQ CM ICD-10 Description Diagnosis I87.311 Chronic venous hypertension (idiopathic) with ulcer of 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 E66.01 Morbid (severe) obesity  due to excess calories I89.0 Lymphedema, not elsewhere classified Modifier:  right lower s of muscle Quantity: 1 extremity Electronic Signature(s) Signed: 02/14/2015 10:17:11 AM By: Evlyn Kanner MD, FACS Entered By: Evlyn Kanner on 02/14/2015 10:17:11

## 2015-02-21 ENCOUNTER — Encounter: Payer: Medicare Other | Admitting: Surgery

## 2015-02-21 DIAGNOSIS — L97313 Non-pressure chronic ulcer of right ankle with necrosis of muscle: Secondary | ICD-10-CM | POA: Diagnosis not present

## 2015-02-21 NOTE — Progress Notes (Addendum)
TEMITAYO, COVALT ([id number]) Visit Report for 02/21/2015 Chief Complaint Document Details Patient Name: [name], [name] Date of Service: 02/21/2015 10:15 AM Medical Record Number: [id number] Patient Account Number: [id number] Date of Birth/Sex: 17-Jul-1935 (79 y.o. Male) Treating RN: Curtis Sites Primary Care Physician: Dorothey Baseman Other Clinician: Referring Physician: Dorothey Baseman Treating Physician/Extender: Rudene Re in Treatment: 5 Information Obtained from: Patient Chief Complaint Patient presents for treatment of an open ulcer due to venous insufficiency. The patient has had a open wound on his right medial ankle for at least 3 years and was last seen in the wound clinic in February 2015. He was lost to follow-up after that. Electronic Signature(s) Signed: 02/21/2015 11:42:05 AM By: Evlyn Kanner MD, FACS Entered By: Evlyn Kanner on 02/21/2015 11:42:05 AADITYA, LETIZIA ([id number]) -------------------------------------------------------------------------------- HPI Details Patient Name: [name], [name] Date of Service: 02/21/2015 10:15 AM Medical Record Number: [id number] Patient Account Number: [id number] Date of Birth/Sex: 1935/09/23 (79 y.o. Male) Treating RN: Curtis Sites Primary Care Physician: Dorothey Baseman Other Clinician: Referring Physician: Dorothey Baseman Treating Physician/Extender: Rudene Re in Treatment: 5 History of Present Illness Location: right lower extremity ulceration Quality: Patient reports experiencing a dull pain to affected area(s). Severity: Patient states wound are getting worse. 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 Modifying Factors: Consults to this date include:has received treatment in the wound center in the past but stopped coming here since February 2015 Associated Signs and Symptoms: Patient reports having difficulty standing for long periods. HPI Description: 79 year old male who is known to have progressive weakness and has been in a nursing home for a while has had chronic lower extremity edema both legs and a large open wound on the right lower extremity which is has at least for about 3-4 years. The patient was seen in the wound clinic before and has been treated until February 2015 when he was lost to follow-up. Past medical history is significant for hypertension, gout, venous stasis ulcers, Parkinson's [name], constipation. He's not been a diabetic but his last hemoglobin A1c was 6 in March 2015. There is documentation that he's had endovenous ablation of bilateral varicose veins but we have no documentation about this and this may be done at least 4-5 years ago. 01/22/2015 -- he has his vascular test is scheduled for this afternoon. 01/29/2015 -- the patient's vascular test was canceled last week because he was unable to get onto the examining bed at [facility]. His Unna's boot was not applied either and the patient had a dressing placed by home health. Today when his dressing was removed we found a lot of maggots in his right lower extremity. 02/07/2015 -- Was seen in the vascular office by the PA and she noted that a bilateral venous duplex study did not show any DVT, SVT or reflux bilaterally. The patient was advised to continue with Unna's boot and would at some stage to require graduated compression stockings of the 20-30 mmHg variety. In the future lymphedema pumps would also be considered. 02/14/2015 -- his dressing is smelling quite a bit and there is a discoloration of the wound suggestive of an infection. Deep tissue cultures will be taken today. 02/21/2015 --  the culture  is back and it has growth moderate growth of Proteus and gram-negative rods sensitive to sulfa, ampicillin, cephazolin, Zosyn. He is already on doxycycline and his wound is looking clean and hence we will continue with this. Electronic Signature(s) Signed: 02/21/2015 11:42:26 AM By: Evlyn Kanner MD, FACS Previous Signature: 02/21/2015 11:23:35 AM Version By: Evlyn Kanner MD, FACS Entered By: Evlyn Kanner on 02/21/2015 11:42:26 [name], [name] ([id number]) [name], [name] ([id number]) -------------------------------------------------------------------------------- Physical Exam Details Patient Name: [name], [name] Date of Service: 02/21/2015 10:15 AM Medical Record Number: [id number] Patient Account Number: [id number] Date of Birth/Sex: 1935/08/08 (79 y.o. Male) Treating RN: Curtis Sites Primary Care Physician: Dorothey Baseman Other Clinician: Referring Physician: Terance Hart, DAVID Treating Physician/Extender: Rudene Re in Treatment: 5 Constitutional . Pulse regular. Respirations normal and unlabored. Afebrile. . Eyes Nonicteric. Reactive to light. Ears, Nose, Mouth, and Throat Lips, teeth, and gums WNL.Marland Kitchen Moist mucosa without lesions . Neck supple and nontender. No palpable supraclavicular or cervical adenopathy. Normal sized without goiter. Respiratory WNL. No retractions.. Cardiovascular Pedal Pulses WNL. No clubbing, cyanosis or edema. Lymphatic No adneopathy. No adenopathy. No adenopathy. Musculoskeletal Adexa without tenderness or enlargement.. Digits and nails w/o clubbing, cyanosis, infection, petechiae, ischemia, or inflammatory conditions.. Integumentary (Hair, Skin) No suspicious lesions. No crepitus or fluctuance. No peri-wound warmth or erythema. No masses.Marland Kitchen Psychiatric Judgement and insight Intact.. No evidence of depression, anxiety, or agitation.. Notes The wound is looking much better this week after he is being put on doxycycline and there  is no odor and the granulation tissue is clean. Electronic Signature(s) Signed: 02/21/2015 11:43:06 AM By: Evlyn Kanner MD, FACS Entered By: Evlyn Kanner on 02/21/2015 11:43:06 BASCOM, BIEL ([id number]) -------------------------------------------------------------------------------- Physician Orders Details Patient Name: [name], [name] Date of Service: 02/21/2015 10:15 AM Medical Record Number: [id number] Patient Account Number: [id number] Date of Birth/Sex: 02/08/1936 (79 y.o. 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 o No tub bath. Skin Barriers/Peri-Wound Care Wound #5 Right,Medial Lower Leg o Barrier cream Primary Wound Dressing Wound #5 Right,Medial Lower Leg o Hydrafera Blue Secondary Dressing Wound #5 Right,Medial Lower Leg o ABD pad o XtraSorb Dressing Change Frequency Wound #5 Right,Medial Lower Leg o Change dressing every week o Other: - As needed for drainage Follow-up Appointments Wound #5 Right,Medial Lower Leg o Return Appointment in 1 week. [name], [name] ([id number]) Edema Control Wound #5 Right,Medial Lower Leg o Unna Boot to Right Lower Extremity Additional Orders / Instructions Wound #5 Right,Medial Lower Leg o Other: - SNF nurse may change unna wrap as needed for  copius drainage or odor. SNF nurse is to call [facility] prior to changing unna wrap for instruction r/t improper unna wrap Please call us at [phone] to be sure wrap is applied correctly prior to changing wrap. Speak with Selena Batten or Mardene Celeste Medications-please add to medication list. Wound #5 Right,Medial Lower Leg o P.O. Antibiotics - Doxy 100mg  BID x7days Electronic Signature(s) Signed: 02/21/2015 1:56:54 PM  By: Evlyn Kanner MD, FACS Signed: 02/21/2015 4:44:11 PM By: Curtis Sites Entered By: Curtis Sites on 02/21/2015 11:45:28 [name], [name] ([id number]) -------------------------------------------------------------------------------- Problem List Details Patient Name: [name], [name] Date of Service: 02/21/2015 10:15 AM Medical Record Number: [id number] Patient Account Number: [id number] Date of Birth/Sex: August 18, 1935 (79 y.o. 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 Date of Service: 02/21/2015 10:15 AM Medical Record Number: [id number] Patient Account Number: [id number] Date of Birth/Sex: 1935-10-17  (79 y.o. Male) Treating RN: Curtis Sites Primary Care Physician: Dorothey Baseman Other Clinician: Referring Physician: Dorothey Baseman Treating Physician/Extender: Rudene Re in Treatment: 5 Subjective Chief Complaint Information obtained from Patient Patient presents for treatment of an open ulcer due to venous insufficiency. The patient has had a open wound on his right medial ankle for at least 3 years and was last seen in the wound clinic in February 2015. He was lost to follow-up after that. History of Present Illness (HPI) The following HPI elements were documented for the patient's wound: Location: right lower extremity ulceration Quality: Patient reports experiencing a dull pain to affected area(s). Severity: Patient states wound are getting worse. 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 Modifying Factors: Consults to this date include:has received treatment in the wound center in the past but stopped coming here since February 2015 Associated Signs and Symptoms: Patient reports having difficulty standing for long periods. 79 year old male who is known to have progressive weakness and has been in a nursing home for a while has had chronic lower extremity edema both legs and a large open wound on the right lower extremity which is has at least for about 3-4 years. The patient was seen in the wound clinic before and has been treated until February 2015 when he was lost to follow-up. Past medical history is significant for hypertension, gout, venous stasis ulcers, Parkinson's [name], constipation. He's not been a diabetic but his last hemoglobin A1c was 6 in March  2015. There is documentation that he's had endovenous ablation of bilateral varicose veins but we have no documentation about this and this may be done at least 4-5 years ago. 01/22/2015 -- he has his vascular test is scheduled for this afternoon. 01/29/2015 -- the patient's vascular test was canceled last week because he was unable to get onto the examining bed at [facility]. His Unna's boot was not applied either and the patient had a dressing placed by home health. Today when his dressing was removed we found a lot of maggots in his right lower extremity. 02/07/2015 -- Was seen in the vascular office  by the PA and she noted that a bilateral venous duplex study did not show any DVT, SVT or reflux bilaterally. The patient was advised to continue with Unna's boot and would at some stage to require graduated compression stockings of the 20-30 mmHg variety. In the future lymphedema pumps would also be considered. 02/14/2015 -- his dressing is smelling quite a bit and there is a discoloration of the wound suggestive of an [name], [name] ([id number]) infection. Deep tissue cultures will be taken today. 02/21/2015 -- the culture is back and it has growth moderate growth of Proteus and gram-negative rods sensitive to sulfa, ampicillin, cephazolin, Zosyn. He is already on doxycycline and his wound is looking clean and hence we will continue with this. Objective Constitutional Pulse regular. Respirations normal and unlabored. Afebrile. Vitals Time Taken: 10:47 AM, Height: 68 in, Temperature: 98.2 F, Pulse: 72 bpm, Respiratory Rate: 18 breaths/min, Blood Pressure: 137/57 mmHg. Eyes Nonicteric. Reactive to light. Ears, Nose, Mouth, and Throat Lips, teeth, and gums WNL.Marland Kitchen Moist mucosa without lesions . Neck supple and nontender. No palpable supraclavicular or cervical adenopathy. Normal sized without goiter. Respiratory WNL. No retractions.. Cardiovascular Pedal Pulses WNL. No clubbing, cyanosis or  edema. Lymphatic No adneopathy. No adenopathy. No adenopathy. Musculoskeletal Adexa without tenderness or enlargement.. Digits and nails w/o clubbing, cyanosis, infection, petechiae, ischemia, or inflammatory conditions.Marland Kitchen Psychiatric Judgement and insight Intact.. No evidence of depression, anxiety, or agitation.. General Notes: The wound is looking much better this week after he is being put on doxycycline and there is no odor and the granulation tissue is clean. Integumentary (Hair, Skin) [name], [name] ([id number]) No suspicious lesions. No crepitus or fluctuance. No peri-wound warmth or erythema. No masses.. Wound #5 status is Open. Original cause of wound was Gradually Appeared. The wound is located on the Right,Medial Lower Leg. The wound is limited to skin breakdown. There is no tunneling or undermining noted. There is a large amount of serosanguineous drainage noted. The wound margin is distinct with the outline attached to the wound base. There is large (67-100%) pink, pale granulation within the wound bed. There is a small (1-33%) amount of necrotic tissue within the wound bed including Adherent Slough. The periwound skin appearance exhibited: Localized Edema, Moist, Mottled. The periwound skin appearance did not exhibit: Callus, Crepitus, Excoriation, Fluctuance, Friable, Induration, Rash, Scarring, Dry/Scaly, Maceration, Atrophie Blanche, Cyanosis, Ecchymosis, Hemosiderin Staining, Pallor, Rubor, Erythema. Periwound temperature was noted as No Abnormality. 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 He will see me back next  week. Plan Wound Cleansing: Wound #5 Right,Medial Lower Leg: Cleanse wound with mild soap and water May shower with protection. No tub bath. Skin Barriers/Peri-Wound Care: Wound #5 Right,Medial Lower Leg: Barrier cream Primary Wound Dressing: Wound #5 Right,Medial Lower Leg: Hydrafera Blue Secondary Dressing: Wound #5 Right,Medial Lower Leg: ABD pad KWALI, WRINKLE ([id number]) XtraSorb Dressing Change Frequency: Wound #5 Right,Medial Lower Leg: Change dressing every week Other: - As needed for drainage Follow-up Appointments: Wound #5 Right,Medial Lower Leg: Return Appointment in 1 week. Edema Control: Wound #5 Right,Medial Lower Leg: Unna Boot to Right Lower Extremity Additional Orders / Instructions: Wound #5 Right,Medial Lower Leg: Other: - SNF nurse may change unna  wrap as needed for copius drainage or odor. SNF nurse is to call [facility] prior to changing unna wrap for instruction r/t improper unna wrap Please call us at [phone] to be sure wrap is applied correctly prior to changing wrap. Speak with Selena Batten or Mardene Celeste Medications-please add to medication list.: Wound #5 Right,Medial Lower Leg: P.O. Antibiotics - Doxy 100mg  BID x7days I will continue with Hydrofera Blue, Unna's boots and he will complete his course of doxycycline. He will see me back next week. Electronic Signature(s) Signed: 02/21/2015 4:29:57 PM By: Evlyn Kanner MD, FACS Previous Signature: 02/21/2015 11:43:50 AM Version By: Evlyn Kanner MD, FACS Entered By: Evlyn Kanner on 02/21/2015 16:29:57 KEEFER, SOULLIERE ([id number]) -------------------------------------------------------------------------------- SuperBill Details Patient Name: KIEV, LABROSSE. Date of Service: 02/21/2015 Medical Record Number: [id number] Patient Account Number: [id number] Date of Birth/Sex: [birth year] (79 y.o. 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 E66.01 Morbid (severe) obesity due to excess calories I89.0 Lymphedema, not elsewhere classified Facility Procedures CPT4: Description Modifier Quantity Code [id number] (Facility Use Only) [phone] - APPLY MULTLAY COMPRS LWR RT 1 LEG Physician Procedures CPT4 Code Description: [id number] - WC PHYS LEVEL 3 - EST PT ICD-10 Description Diagnosis I87.311 Chronic venous hypertension (idiopathic) with ulcer of 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 E66.01 Morbid (severe) obesity due  to excess calories I89.0 Lymphedema, not elsewhere classified Modifier: right lower s of muscle Quantity: 1 extremity Electronic Signature(s) Signed: 02/21/2015 2:01:32 PM By: Curtis Sites Signed: 02/21/2015 4:25:28 PM By: Evlyn Kanner MD, FACS Previous Signature: 02/21/2015 11:44:08 AM Version By: Evlyn Kanner MD, FACS Entered By: Curtis Sites on 02/21/2015 14:01:32

## 2015-02-22 NOTE — Progress Notes (Signed)
CORDARO, MUKAI ([id number]) Visit Report for 02/21/2015 Arrival Information Details Patient Name: [name], [name] Date of Service: 02/21/2015 10:15 AM Medical Record Number: [id number] Patient Account Number: [id number] Date of Birth/Sex: 19-Oct-1935 (79 y.o. Male) Treating RN: Curtis Sites Primary Care Physician: Dorothey Baseman Other Clinician: Referring Physician: Terance Hart, DAVID Treating Physician/Extender: Rudene Re in Treatment: 5 Visit Information History Since Last Visit Added or deleted any medications: No Patient Arrived: Wheel Chair Any new allergies or adverse reactions: No Arrival Time: 10:47 Had a fall or experienced change in No activities of daily living that may affect Accompanied By: self risk of falls: Transfer Assistance: Hoyer Lift Signs or symptoms of abuse/neglect since last No Patient Identification Verified: Yes visito Secondary Verification Process Yes Hospitalized since last visit: No Completed: Pain Present Now: No Patient Requires Transmission-Based No Precautions: Patient Has Alerts: No Electronic Signature(s) Signed: 02/21/2015 4:44:11 PM By: Curtis Sites Entered By: Curtis Sites on 02/21/2015 10:47:24 [name] ([id number]) -------------------------------------------------------------------------------- Encounter Discharge Information Details Patient Name: [name] Date of Service: 02/21/2015 10:15 AM Medical Record Number: [id number] Patient Account Number: [id number] Date of Birth/Sex: 08/18/1935 (79 y.o. Male) Treating RN: Curtis Sites Primary Care Physician: Dorothey Baseman Other Clinician: Referring Physician: Dorothey Baseman Treating Physician/Extender: Rudene Re in Treatment: 5 Encounter Discharge Information Items Discharge Pain Level: 0 Discharge Condition: Stable Ambulatory Status: Wheelchair Discharge Destination: Nursing Home Transportation: Private Auto Accompanied By:  self Schedule Follow-up Appointment: Yes Medication Reconciliation completed and provided to Patient/Care No Esiah Bazinet: Provided on Clinical Summary of Care: 02/21/2015 Form Type Recipient Paper Patient [facility] Electronic Signature(s) Signed: 02/21/2015 2:02:34 PM By: Curtis Sites Previous Signature: 02/21/2015 12:12:40 PM Version By: Gwenlyn Perking Entered By: Curtis Sites on 02/21/2015 14:02:34 [name] ([id number]) -------------------------------------------------------------------------------- Lower Extremity Assessment Details Patient Name: [name] Date of Service: 02/21/2015 10:15 AM Medical Record Number: [id number] Patient Account Number: [id number] Date of Birth/Sex: February 06, 1936 (79 y.o. Male) Treating RN: Curtis Sites Primary Care Physician: Dorothey Baseman Other Clinician: Referring Physician: Terance Hart, DAVID Treating Physician/Extender: Rudene Re in Treatment: 5 Edema Assessment Assessed: [Left: No] [Right: No] E[Left: dema] [Right: :] Calf Left: Right: Point of Measurement: 40 cm From Medial Instep cm 39.2 cm Ankle Left: Right: Point of Measurement: 10 cm From Medial Instep cm 29 cm Electronic Signature(s) Signed: 02/21/2015 4:44:11 PM By: Curtis Sites Entered By: Curtis Sites on 02/21/2015 11:21:50 [name] ([id number]) -------------------------------------------------------------------------------- Multi Wound Chart Details Patient Name: [name] Date of Service: 02/21/2015 10:15 AM Medical Record Number: [id number] Patient Account Number: [id number] Date of Birth/Sex: Jan 11, 1936 (79 y.o. Male) Treating RN: Curtis Sites Primary Care Physician: Dorothey Baseman Other Clinician: Referring Physician: Terance Hart, DAVID Treating Physician/Extender: Rudene Re in Treatment: 5 Vital Signs Height(in): 68 Pulse(bpm): 72 Weight(lbs): Blood Pressure 137/57 (mmHg): Body Mass  Index(BMI): Temperature(F): 98.2 Respiratory Rate 18 (breaths/min): Photos: [5:No Photos] [N/A:N/A] Wound Location: [5:Right Lower Leg - Medial] [N/A:N/A] Wounding Event: [5:Gradually Appeared] [N/A:N/A] Primary Etiology: [5:Venous Leg Ulcer] [N/A:N/A] Comorbid History: [5:Cataracts, Anemia, Hypertension, Osteoarthritis] [N/A:N/A] Date Acquired: [5:11/13/2014] [N/A:N/A] Weeks of Treatment: [5:5] [N/A:N/A] Wound Status: [5:Open] [N/A:N/A] Classification: [5:Full Thickness Without Exposed Support Structures] [N/A:N/A] Exudate Amount: [5:Large] [N/A:N/A] Exudate Type: [5:Serosanguineous] [N/A:N/A] Exudate Color: [5:red, brown] [N/A:N/A] Foul Odor After [5:Yes] [N/A:N/A] Cleansing: Odor Anticipated Due to [5:No] [N/A:N/A] Product Use: Wound Margin: [5:Distinct, outline attached] [N/A:N/A] Granulation Amount: [5:Large (67-100%)] [N/A:N/A] Granulation Quality: [5:Pink, Pale] [N/A:N/A] Necrotic Amount: [5:Small (1-33%)] [N/A:N/A] Exposed Structures: [5:Fascia: No Fat: No Tendon: No Muscle: No Joint: No  Bone: No] [N/A:N/A] Limited to Skin Breakdown Epithelialization: None N/A N/A Periwound Skin Texture: Edema: Yes N/A N/A Excoriation: No Induration: No Callus: No Crepitus: No Fluctuance: No Friable: No Rash: No Scarring: No Periwound Skin Moist: Yes N/A N/A Moisture: Maceration: No Dry/Scaly: No Periwound Skin Color: Mottled: Yes N/A N/A Atrophie Blanche: No Cyanosis: No Ecchymosis: No Erythema: No Hemosiderin Staining: No Pallor: No Rubor: No Temperature: No Abnormality N/A N/A Tenderness on No N/A N/A Palpation: Wound Preparation: Ulcer Cleansing: Other: N/A N/A soap and water Topical Anesthetic Applied: Other: lidocaine 4% Treatment Notes Electronic Signature(s) Signed: 02/21/2015 4:44:11 PM By: Curtis Sites Entered By: Curtis Sites on 02/21/2015 11:44:50 Pier, Yoos Chrissie Noa  ([id number]) -------------------------------------------------------------------------------- Multi-Disciplinary Care Plan Details Patient Name: [name] Date of Service: 02/21/2015 10:15 AM Medical Record Number: [id number] Patient Account Number: [id number] Date of Birth/Sex: 09/21/1935 (79 y.o. Male) Treating RN: Curtis Sites Primary Care Physician: Dorothey Baseman Other Clinician: Referring Physician: Dorothey Baseman Treating Physician/Extender: Rudene Re in Treatment: 5 Active Inactive Orientation to the Wound Care Program Nursing Diagnoses: Knowledge deficit related to the wound healing center program Goals: Patient/caregiver will verbalize understanding of the Wound Healing Center Program Date Initiated: 01/15/2015 Goal Status: Active Interventions: Provide education on orientation to the wound center Notes: Venous Leg Ulcer Nursing Diagnoses: Knowledge deficit related to disease process and management Potential for venous Insuffiency (use before diagnosis confirmed) Goals: Patient will maintain optimal edema control Date Initiated: 01/15/2015 Goal Status: Active Patient/caregiver will verbalize understanding of disease process and disease management Date Initiated: 01/15/2015 Goal Status: Active Verify adequate tissue perfusion prior to therapeutic compression application Date Initiated: 01/15/2015 Goal Status: Active Interventions: Assess peripheral edema status every visit. Compression as ordered Provide education on venous insufficiency ZAYVIER, SOLIVEN ([id number]) Treatment Activities: Test ordered outside of clinic : 02/21/2015 Therapeutic compression applied : 02/21/2015 Venous Duplex Doppler : 02/21/2015 Notes: Wound/Skin Impairment Nursing Diagnoses: Impaired tissue integrity Knowledge deficit related to ulceration/compromised skin integrity Goals: Patient/caregiver will verbalize understanding of skin care regimen Date Initiated:  01/15/2015 Goal Status: Active Ulcer/skin breakdown will have a volume reduction of 30% by week 4 Date Initiated: 01/15/2015 Goal Status: Active Ulcer/skin breakdown will have a volume reduction of 50% by week 8 Date Initiated: 01/15/2015 Goal Status: Active Ulcer/skin breakdown will have a volume reduction of 80% by week 12 Date Initiated: 01/15/2015 Goal Status: Active Ulcer/skin breakdown will heal within 14 weeks Date Initiated: 01/15/2015 Goal Status: Active Interventions: Assess patient/caregiver ability to perform ulcer/skin care regimen upon admission and as needed Assess ulceration(s) every visit Provide education on ulcer and skin care Notes: Electronic Signature(s) Signed: 02/21/2015 4:44:11 PM By: Curtis Sites Entered By: Curtis Sites on 02/21/2015 11:44:16 [name] ([id number]) -------------------------------------------------------------------------------- Patient/Caregiver Education Details Patient Name: [name] Date of Service: 02/21/2015 10:15 AM Medical Record Number: [id number] Patient Account Number: [id number] Date of Birth/Gender: 02/04/1936 (79 y.o. Male) Treating RN: Curtis Sites Primary Care Physician: Dorothey Baseman Other Clinician: Referring Physician: Dorothey Baseman Treating Physician/Extender: Rudene Re in Treatment: 5 Education Assessment Education Provided To: Patient Education Topics Provided Pressure: Handouts: Other: offloading bottom with immobility and being w/c bound Methods: Explain/Verbal Responses: State content correctly Electronic Signature(s) Signed: 02/21/2015 2:03:56 PM By: Curtis Sites Entered By: Curtis Sites on 02/21/2015 14:03:56 [name] ([id number]) -------------------------------------------------------------------------------- Wound Assessment Details Patient Name: [name] Date of Service: 02/21/2015 10:15 AM Medical Record Number: [id number] Patient Account  Number: [id number] Date of Birth/Sex: 1935-08-25 (79 y.o. Male) Treating RN: Curtis Sites Primary  Care Physician: Terance Hart, DAVID Other Clinician: Referring Physician: Dorothey Baseman Treating Physician/Extender: Rudene Re in Treatment: 5 Wound Status Wound Number: 5 Primary Venous Leg Ulcer Etiology: Wound Location: Right Lower Leg - Medial Wound Status: Open Wounding Event: Gradually Appeared Comorbid Cataracts, Anemia, Hypertension, Date Acquired: 11/13/2014 History: Osteoarthritis Weeks Of Treatment: 5 Clustered Wound: No Photos Photo Uploaded By: Curtis Sites on 02/21/2015 16:42:50 Wound Measurements % Reduction in Area: % Reduction in Volume: Epithelialization: None Tunneling: No Undermining: No Wound Description Full Thickness Without Exposed Classification: Support Structures Wound Margin: Distinct, outline attached Exudate Large Amount: Exudate Type: Serosanguineous Exudate Color: red, brown Foul Odor After Cleansing: Yes Due to Product Use: No Wound Bed Granulation Amount: Large (67-100%) Exposed Structure Granulation Quality: Pink, Pale Fascia Exposed: No Necrotic Amount: Small (1-33%) Fat Layer Exposed: No LIZZIE, COKLEY ([id number]) Necrotic Quality: Adherent Slough Tendon Exposed: No Muscle Exposed: No Joint Exposed: No Bone Exposed: No Limited to Skin Breakdown Periwound Skin Texture Texture Color No Abnormalities Noted: No No Abnormalities Noted: No Callus: No Atrophie Blanche: No Crepitus: No Cyanosis: No Excoriation: No Ecchymosis: No Fluctuance: No Erythema: No Friable: No Hemosiderin Staining: No Induration: No Mottled: Yes Localized Edema: Yes Pallor: No Rash: No Rubor: No Scarring: No Temperature / Pain Moisture Temperature: No Abnormality No Abnormalities Noted: No Dry / Scaly: No Maceration: No Moist: Yes Wound Preparation Ulcer Cleansing: Other: soap and water, Topical Anesthetic Applied: Other:  lidocaine 4%, Treatment Notes Wound #5 (Right, Medial Lower Leg) 1. Cleansed with: Cleanse wound with antibacterial soap and water 2. Anesthetic Topical Lidocaine 4% cream to wound bed prior to debridement 4. Dressing Applied: Hydrafera Blue 5. Secondary Dressing Applied ABD Pad 7. Secured with Henriette Combs to Right Lower Extremity Notes xtrasorb Electronic Signature(s) Signed: 02/21/2015 4:44:11 PM By: Curtis Sites Entered By: Curtis Sites on 02/21/2015 11:42:26 Stetson, Pelaez Chrissie Noa ([id number]) DUAN, SCHARNHORST ([id number]) -------------------------------------------------------------------------------- Vitals Details Patient Name: TIMMOTHY, BARANOWSKI. Date of Service: 02/21/2015 10:15 AM Medical Record Number: [id number] Patient Account Number: [id number] Date of Birth/Sex: [birth year] (79 y.o. Male) Treating RN: Curtis Sites Primary Care Physician: Dorothey Baseman Other Clinician: Referring Physician: Terance Hart, DAVID Treating Physician/Extender: Rudene Re in Treatment: 5 Vital Signs Time Taken: 10:47 Temperature (F): 98.2 Height (in): 68 Pulse (bpm): 72 Respiratory Rate (breaths/min): 18 Blood Pressure (mmHg): 137/57 Reference Range: 80 - 120 mg / dl Electronic Signature(s) Signed: 02/21/2015 4:44:11 PM By: Curtis Sites Entered By: Curtis Sites on 02/21/2015 10:49:38

## 2015-02-28 ENCOUNTER — Encounter: Payer: Medicare Other | Admitting: Surgery

## 2015-02-28 DIAGNOSIS — L97313 Non-pressure chronic ulcer of right ankle with necrosis of muscle: Secondary | ICD-10-CM | POA: Diagnosis not present

## 2015-02-28 NOTE — Progress Notes (Addendum)
[name], [name] ([id number]) Visit Report for 02/28/2015 Chief Complaint Document Details Patient Name: [name], [name] Date of Service: 02/28/2015 1:00 PM Medical Record Number: [id number] Patient Account Number: [id number] Date of Birth/Sex: Jun 24, 1936 (79 y.o. Male) Treating RN: Clover Mealy, RN, BSN, [place] Sink Primary Care Physician: Terance Hart, DAVID Other Clinician: Referring Physician: Dorothey Baseman Treating Physician/Extender: Rudene Re in Treatment: 6 Information Obtained from: Patient Chief Complaint Patient presents for treatment of an open ulcer due to venous insufficiency. The patient has had a open wound on his right medial ankle for at least 3 years and was last seen in the wound clinic in February 2015. He was lost to follow-up after that. Electronic Signature(s) Signed: 02/28/2015 1:45:23 PM By: Evlyn Kanner MD, FACS Entered By: Evlyn Kanner on 02/28/2015 13:45:23 Leoncio, Hansen Chrissie Noa ([id number]) -------------------------------------------------------------------------------- HPI Details Patient Name: [name], [name]. Date of Service: 02/28/2015 1:00 PM Medical Record Number: [id number] Patient Account Number: [id number] Date of Birth/Sex: [birth year] (79 y.o. Male) Treating RN: Clover Mealy, RN, BSN, [place] Sink Primary Care Physician: Terance Hart, DAVID Other Clinician: Referring Physician: Terance Hart, DAVID Treating Physician/Extender: Rudene Re in Treatment: 6 History of Present Illness Location: right lower extremity ulceration Quality: Patient reports experiencing a dull pain to affected area(s). Severity: Patient states wound are getting worse. 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 Modifying Factors: Consults to this date include:has received treatment in the wound center in the past but stopped coming here since February 2015 Associated Signs and Symptoms: Patient reports having difficulty standing for long periods. HPI Description: 79 year old male who is known to have progressive weakness and has been in a nursing home for a while has had chronic lower extremity edema both legs and a large open wound on the right lower extremity which is has at least for about 3-4 years. The patient was seen in the wound clinic before and has been treated until February 2015 when he was lost to follow-up. Past medical history is significant for hypertension, gout, venous stasis ulcers, Parkinson's [name], constipation. He's not been a diabetic but his last hemoglobin A1c was 6 in March 2015. There is documentation that he's had endovenous ablation of bilateral varicose veins but we have no documentation about this and this may be done at least 4-5 years ago. 01/22/2015 -- he has his vascular test is scheduled for this afternoon. 01/29/2015 -- the patient's vascular test was canceled last week because he was unable to get onto the examining bed at [facility]. His Unna's boot was not applied either and the patient had a dressing placed by home health. Today when his dressing was removed we found a lot of maggots in his right lower extremity. 02/07/2015 -- Was seen in the vascular office by the PA and she noted that a bilateral venous duplex study did not show any DVT, SVT or reflux bilaterally. The patient was advised to continue with Unna's boot and would at some stage to require graduated compression stockings of the 20-30 mmHg variety. In the future lymphedema pumps would also be considered. 02/14/2015 -- his dressing is smelling quite a bit and there is a discoloration of the wound suggestive of an infection. Deep tissue cultures will  be taken today. 02/21/2015 -- the  culture is back and it has growth moderate growth of Proteus and gram-negative rods sensitive to sulfa, ampicillin, cephazolin, Zosyn. He is already on doxycycline and his wound is looking clean and hence we will continue with this. 02/28/2015 -- he's been doing fine still on his anti-buttocks and has no fresh issues. Electronic Signature(s) Signed: 02/28/2015 1:45:56 PM By: Evlyn Kanner MD, FACS Entered By: Evlyn Kanner on 02/28/2015 13:45:56 JOSHU, FURUKAWA ([id number]) MARY, HOCKEY ([id number]) -------------------------------------------------------------------------------- Physical Exam Details Patient Name: [name], [name]. Date of Service: 02/28/2015 1:00 PM Medical Record Number: [id number] Patient Account Number: [id number] Date of Birth/Sex: Oct 31, 1935 (79 y.o. Male) Treating RN: Clover Mealy, RN, BSN, [place] Sink Primary Care Physician: Terance Hart, DAVID Other Clinician: Referring Physician: Terance Hart, DAVID Treating Physician/Extender: Rudene Re in Treatment: 6 Constitutional . Pulse regular. Respirations normal and unlabored. Afebrile. . Eyes Nonicteric. Reactive to light. Ears, Nose, Mouth, and Throat Lips, teeth, and gums WNL.Marland Kitchen Moist mucosa without lesions . Neck supple and nontender. No palpable supraclavicular or cervical adenopathy. Normal sized without goiter. Respiratory WNL. No retractions.. Cardiovascular Pedal Pulses WNL. No clubbing, cyanosis or edema. Chest Breasts symmetical and no nipple discharge.. Breast tissue WNL, no masses, lumps, or tenderness.. Lymphatic No adneopathy. No adenopathy. No adenopathy. Musculoskeletal Adexa without tenderness or enlargement.. Digits and nails w/o clubbing, cyanosis, infection, petechiae, ischemia, or inflammatory conditions.. Integumentary (Hair, Skin) No suspicious lesions. No crepitus or fluctuance. No peri-wound warmth or erythema. No masses.Marland Kitchen Psychiatric Judgement and insight Intact.. No evidence of  depression, anxiety, or agitation.. Notes The wound is markedly better and there is no evidence of any cellulitis around. His granulation tissue is looking healthy. Electronic Signature(s) Signed: 02/28/2015 1:46:31 PM By: Evlyn Kanner MD, FACS Entered By: Evlyn Kanner on 02/28/2015 13:46:31 Aris, Even Chrissie Noa ([id number]) -------------------------------------------------------------------------------- Physician Orders Details Patient Name: TRAYTON, SZABO. Date of Service: 02/28/2015 1:00 PM Medical Record Number: [id number] Patient Account Number: [id number] Date of Birth/Sex: 19-Mar-1936 (79 y.o. Male) Treating RN: Clover Mealy, RN, BSN, [place] Sink Primary Care Physician: Terance Hart, DAVID Other Clinician: Referring Physician: Terance Hart DAVID Treating Physician/Extender: Rudene Re in Treatment: 6 Verbal / Phone Orders: Yes Clinician: Afful, RN, BSN, Rita Read Back and Verified: Yes Diagnosis Coding Wound Cleansing Wound #5 Right,Medial Lower Leg o Cleanse wound with mild soap and water o May shower with protection. o No tub bath. Skin Barriers/Peri-Wound Care Wound #5 Right,Medial Lower Leg o Barrier cream Primary Wound Dressing Wound #5 Right,Medial Lower Leg o Hydrafera Blue Secondary Dressing Wound #5 Right,Medial Lower Leg o ABD pad o XtraSorb Dressing Change Frequency Wound #5 Right,Medial Lower Leg o Change dressing every week o Other: - As needed for drainage Follow-up Appointments Wound #5 Right,Medial Lower Leg o Return Appointment in 1 week. Edema Control Wound #5 Right,Medial Lower Leg o Unna Boot to Right Lower Extremity Additional Orders / Instructions Wound #5 Right,Medial Lower Leg PELHAM, HENNICK ([id number]) o Other: - SNF nurse may change unna wrap as needed for copius drainage or odor. SNF nurse is to call [facility] prior to changing unna wrap for instruction r/t improper unna wrap Please call us at [phone] to be sure wrap is applied correctly prior to changing wrap. Speak with Selena Batten or Mardene Celeste Notes authourization for [employer]) Signed: 02/28/2015 1:59:02 PM By: Elpidio Eric BSN, RN Signed: 02/28/2015 4:07:43 PM By: Evlyn Kanner MD, FACS Previous Signature: 02/28/2015 1:37:15 PM Version By: Elpidio Eric BSN, RN Entered By: Elpidio Eric  on 02/28/2015 13:59:01 TYSIN, RAREY ([id number]) -------------------------------------------------------------------------------- Problem List Details Patient Name: GAROLD, BROERS. Date of Service: 02/28/2015 1:00 PM Medical Record Number: [id number] Patient Account Number: [id number] Date of Birth/Sex: 1936/03/30 (79 y.o. 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 Date of Service: 02/28/2015 1:00 PM Medical Record Number: [id number] Patient Account Number: [id number] Date of Birth/Sex: 04/23/1936 (79 y.o. Male) Treating RN: Clover Mealy, RN, BSN, [place] Sink Primary Care Physician: Terance Hart, DAVID Other Clinician: Referring Physician: Dorothey Baseman Treating  Physician/Extender: Rudene Re in Treatment: 6 Subjective Chief Complaint Information obtained from Patient Patient presents for treatment of an open ulcer due to venous insufficiency. The patient has had a open wound on his right medial ankle for at least 3 years and was last seen in the wound clinic in February 2015. He was lost to follow-up after that. History of Present Illness (HPI) The following HPI elements were documented for the patient's wound: Location: right lower extremity ulceration Quality: Patient reports experiencing a dull pain to affected area(s). Severity: Patient states wound are getting worse. 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 Modifying Factors: Consults to this date include:has received treatment in the wound center in the past but stopped coming here since February 2015 Associated Signs and Symptoms: Patient reports having difficulty standing for long periods. 79 year old male who is known to have progressive weakness and has been in a nursing home for a while has had chronic lower extremity edema both legs and a large open wound on the right lower extremity which is has at least for about 3-4 years. The patient was seen in the wound clinic before and has been treated until February 2015 when he was lost to follow-up. Past medical history is significant for hypertension, gout, venous stasis ulcers, Parkinson's [name], constipation. He's not been a diabetic but his last hemoglobin A1c was 6 in March 2015. There is documentation that he's had endovenous ablation of bilateral varicose veins but we have no documentation about this and this may be done at least  4-5 years ago. 01/22/2015 -- he has his vascular test is scheduled for this afternoon. 01/29/2015 -- the patient's vascular test was canceled last week because he was unable to get onto the examining bed at [facility]. His Unna's boot was not applied either and the patient had a dressing placed by home health. Today when his dressing was removed we found a lot of maggots in his right lower extremity. 02/07/2015 -- Was seen in the vascular office by the PA and she noted that a bilateral venous duplex study  did not show any DVT, SVT or reflux bilaterally. The patient was advised to continue with Unna's boot and would at some stage to require graduated compression stockings of the 20-30 mmHg variety. In the future lymphedema pumps would also be considered. 02/14/2015 -- his dressing is smelling quite a bit and there is a discoloration of the wound suggestive of an RAYSHAD, RIVIELLO. ([id number]) infection. Deep tissue cultures will be taken today. 02/21/2015 -- the culture is back and it has growth moderate growth of Proteus and gram-negative rods sensitive to sulfa, ampicillin, cephazolin, Zosyn. He is already on doxycycline and his wound is looking clean and hence we will continue with this. 02/28/2015 -- he's been doing fine still on his anti-buttocks and has no fresh issues. Objective Constitutional Pulse regular. Respirations normal and unlabored. Afebrile. Vitals Time Taken: 1:25 PM, Height: 68 in, Temperature: 98.1 F, Pulse: 72 bpm, Respiratory Rate: 19 breaths/min, Blood Pressure: 140/67 mmHg. Eyes Nonicteric. Reactive to light. Ears, Nose, Mouth, and Throat Lips, teeth, and gums WNL.Marland Kitchen Moist mucosa without lesions . Neck supple and nontender. No palpable supraclavicular or cervical adenopathy. Normal sized without goiter. Respiratory WNL. No retractions.. Cardiovascular Pedal Pulses WNL. No clubbing, cyanosis or edema. Chest Breasts symmetical and no nipple discharge.. Breast tissue  WNL, no masses, lumps, or tenderness.. Lymphatic No adneopathy. No adenopathy. No adenopathy. Musculoskeletal Adexa without tenderness or enlargement.. Digits and nails w/o clubbing, cyanosis, infection, petechiae, ischemia, or inflammatory conditions.Marland Kitchen Psychiatric Judgement and insight Intact.. No evidence of depression, anxiety, or agitation.Marland Kitchen BURRELL, HODAPP ([id number]) General Notes: The wound is markedly better and there is no evidence of any cellulitis around. His granulation tissue is looking healthy. Integumentary (Hair, Skin) No suspicious lesions. No crepitus or fluctuance. No peri-wound warmth or erythema. No masses.. Wound #5 status is Open. Original cause of wound was Gradually Appeared. The wound is located on the Right,Medial Lower Leg. The wound measures 11.5cm length x 5.7cm width x 0.2cm depth; 51.483cm^2 area and 10.297cm^3 volume. The wound is limited to skin breakdown. There is no tunneling or undermining noted. There is a large amount of serosanguineous drainage noted. The wound margin is distinct with the outline attached to the wound base. There is large (67-100%) pink, pale granulation within the wound bed. There is a small (1-33%) amount of necrotic tissue within the wound bed including Adherent Slough. The periwound skin appearance exhibited: Localized Edema, Moist, Mottled. The periwound skin appearance did not exhibit: Callus, Crepitus, Excoriation, Fluctuance, Friable, Induration, Rash, Scarring, Dry/Scaly, Maceration, Atrophie Blanche, Cyanosis, Ecchymosis, Hemosiderin Staining, Pallor, Rubor, Erythema. Periwound temperature was noted as No Abnormality. 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 Will check with his insurance company. We will continue with Hydrofera Blue and an Unna's boot. He will see me back next week. Plan Wound Cleansing: Wound #5 Right,Medial Lower Leg: Cleanse wound with mild soap and water May shower with protection. No tub bath. Skin Barriers/Peri-Wound Care: Wound #5 Right,Medial Lower Leg: KORVER, GRAYBEAL ([id number]) Barrier cream Primary Wound Dressing: Wound #5 Right,Medial Lower Leg: Hydrafera Blue Secondary Dressing: Wound #5 Right,Medial Lower Leg: ABD pad XtraSorb Dressing Change Frequency: Wound #5  Right,Medial Lower Leg: Change dressing every week Other: - As needed for drainage Follow-up Appointments: Wound #5 Right,Medial Lower Leg: Return Appointment in 1 week. Edema Control: Wound #5 Right,Medial Lower Leg: Unna Boot to Right Lower Extremity Additional Orders / Instructions: Wound #5 Right,Medial Lower Leg: Other: - SNF nurse may change unna wrap as needed for copius drainage or odor. SNF nurse is to call [facility] prior to changing unna wrap for instruction r/t improper unna wrap Please call us at [phone] to be sure wrap is applied correctly prior to changing wrap. Speak with Selena Batten or Mardene Celeste General Notes: authourization for apligraf Overall there is been excellent improvement in his ulcer base and I believe he is getting close to ready to apply Apligraf once it is authorized. Will check with his insurance company. We will continue with Hydrofera Blue and an Unna's boot. He will see me back next week. Electronic Signature(s) Signed: 03/01/2015 2:53:01 PM By: Evlyn Kanner MD, FACS Previous Signature: 02/28/2015 1:48:02 PM Version By: Evlyn Kanner MD, FACS Entered By: Evlyn Kanner on 03/01/2015 14:53:01 WINDSOR, GOEKEN  ([id number]) -------------------------------------------------------------------------------- SuperBill Details Patient Name: [name]. Date of Service: 02/28/2015 Medical Record Number: [id number] Patient Account Number: [id number] Date of Birth/Sex: December 27, 1935 (79 y.o. 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 E66.01 Morbid (severe) obesity due to excess calories I89.0 Lymphedema, not elsewhere classified Facility Procedures CPT4 Code: [id number] Description: (Facility Use Only) [phone] - APPLY Roland Rack BOOT RT Modifier: Quantity: 1 Physician Procedures CPT4 Code Description: [id number] - WC PHYS LEVEL 3 - EST PT ICD-10 Description Diagnosis I87.311 Chronic venous hypertension (idiopathic) with ulcer of 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 E66.01 Morbid (severe) obesity due  to excess calories I89.0 Lymphedema, not elsewhere classified Modifier: right lower s of muscle Quantity: 1 extremity Electronic Signature(s) Signed: 02/28/2015 2:04:30 PM By: Elpidio Eric BSN, RN Signed: 02/28/2015 4:07:43 PM By: Evlyn Kanner MD, FACS Previous Signature: 02/28/2015 1:48:16 PM Version By: Evlyn Kanner MD, FACS Entered By: Elpidio Eric on 02/28/2015 14:04:29

## 2015-02-28 NOTE — Progress Notes (Signed)
DAVEION, ROBAR ([id number]) Visit Report for 02/28/2015 Arrival Information Details Patient Name: [name], [name] Date of Service: 02/28/2015 1:00 PM Medical Record Number: [id number] Patient Account Number: [id number] Date of Birth/Sex: [birth year] (79 y.o. Male) Treating RN: Clover Mealy, RN, BSN, [place] Sink Primary Care Physician: Terance Hart, DAVID Other Clinician: Referring Physician: Terance Hart, DAVID Treating Physician/Extender: Rudene Re in Treatment: 6 Visit Information History Since Last Visit Any new allergies or adverse reactions: No Patient Arrived: Wheel Chair Had a fall or experienced change in No activities of daily living that may affect Arrival Time: 13:13 risk of falls: Accompanied By: self Signs or symptoms of abuse/neglect since last No Transfer Assistance: Nurse, adult visito Patient Identification Verified: Yes Hospitalized since last visit: No Secondary Verification Process Yes Has Dressing in Place as Prescribed: Yes Completed: Has Compression in Place as Prescribed: Yes Patient Requires Transmission-Based No Pain Present Now: No Precautions: Patient Has Alerts: No Electronic Signature(s) Signed: 02/28/2015 1:21:25 PM By: Elpidio Eric BSN, RN Entered By: Elpidio Eric on 02/28/2015 13:21:25 [name] ([id number]) -------------------------------------------------------------------------------- Encounter Discharge Information Details Patient Name: [name] Date of Service: 02/28/2015 1:00 PM Medical Record Number: [id number] Patient Account Number: [id number] Date of Birth/Sex: 19-Nov-1935 (78 y.o. Male) Treating RN: Clover Mealy, RN, BSN, [place] Sink Primary Care Physician: Terance Hart, DAVID Other Clinician: Referring Physician: Terance Hart, DAVID Treating Physician/Extender: Rudene Re in Treatment: 6 Encounter Discharge Information Items Discharge Pain Level: 0 Discharge Condition: Stable Ambulatory Status: Wheelchair Discharge Destination:  Nursing Home Transportation: Other Accompanied By: self Schedule Follow-up Appointment: No Medication Reconciliation completed and provided to Patient/Care No [name]: Provided on Clinical Summary of Care: 02/28/2015 Form Type Recipient Paper Patient [facility] Electronic Signature(s) Signed: 02/28/2015 2:07:20 PM By: Elpidio Eric BSN, RN Previous Signature: 02/28/2015 1:51:21 PM Version By: Gwenlyn Perking Entered By: Elpidio Eric on 02/28/2015 14:07:20 [name] ([id number]) -------------------------------------------------------------------------------- Lower Extremity Assessment Details Patient Name: [name] Date of Service: 02/28/2015 1:00 PM Medical Record Number: [id number] Patient Account Number: [id number] Date of Birth/Sex: [birth year] (79 y.o. Male) Treating RN: Clover Mealy, RN, BSN, [place] Sink Primary Care Physician: Terance Hart, DAVID Other Clinician: Referring Physician: Terance Hart, DAVID Treating Physician/Extender: Rudene Re in Treatment: 6 Edema Assessment Assessed: [Left: No] [Right: No] E[Left: dema] [Right: :] Calf Left: Right: Point of Measurement: 40 cm From Medial Instep cm 39.5 cm Ankle Left: Right: Point of Measurement: 10 cm From Medial Instep cm 28.6 cm Vascular Assessment Pulses: Posterior Tibial Dorsalis Pedis Palpable: [Right:Yes] Extremity colors, hair growth, and conditions: Extremity Color: [Right:Dusky] Hair Growth on Extremity: [Right:No] Temperature of Extremity: [Right:Warm] Capillary Refill: [Right:< 3 seconds] Toe Nail Assessment Left: Right: Thick: Yes Discolored: Yes Deformed: Yes Improper Length and Hygiene: Yes Electronic Signature(s) Signed: 02/28/2015 1:29:18 PM By: Elpidio Eric BSN, RN Entered By: Elpidio Eric on 02/28/2015 13:29:18 [name] ([id number]) -------------------------------------------------------------------------------- Multi Wound Chart Details Patient Name: [name] Date of Service:  02/28/2015 1:00 PM Medical Record Number: [id number] Patient Account Number: [id number] Date of Birth/Sex: 23-Jan-1936 (79 y.o. Male) Treating RN: Clover Mealy, RN, BSN, [place] Sink Primary Care Physician: Terance Hart, DAVID Other Clinician: Referring Physician: Terance Hart, DAVID Treating Physician/Extender: Rudene Re in Treatment: 6 Vital Signs Height(in): 68 Pulse(bpm): 72 Weight(lbs): Blood Pressure 140/67 (mmHg): Body Mass Index(BMI): Temperature(F): 98.1 Respiratory Rate 19 (breaths/min): Photos: [5:No Photos] [N/A:N/A] Wound Location: [5:Right Lower Leg - Medial] [N/A:N/A] Wounding Event: [5:Gradually Appeared] [N/A:N/A] Primary Etiology: [5:Venous Leg Ulcer] [N/A:N/A] Comorbid History: [5:Cataracts, Anemia, Hypertension, Osteoarthritis] [N/A:N/A] Date Acquired: [5:11/13/2014] [N/A:N/A] Weeks of Treatment: [5:6] [N/A:N/A] Wound Status: [5:Open] [  N/A:N/A] Measurements L x W x D 11.5x5.7x0.2 [N/A:N/A] (cm) Area (cm) : [5:51.483] [N/A:N/A] Volume (cm) : [5:10.297] [N/A:N/A] % Reduction in Area: [5:49.80%] [N/A:N/A] % Reduction in Volume: 66.50% [N/A:N/A] Classification: [5:Full Thickness Without Exposed Support Structures] [N/A:N/A] Exudate Amount: [5:Large] [N/A:N/A] Exudate Type: [5:Serosanguineous] [N/A:N/A] Exudate Color: [5:red, brown] [N/A:N/A] Foul Odor After [5:Yes] [N/A:N/A] Cleansing: Odor Anticipated Due to No [N/A:N/A] Product Use: Wound Margin: [5:Distinct, outline attached] [N/A:N/A] Granulation Amount: [5:Large (67-100%)] [N/A:N/A] Granulation Quality: [5:Pink, Pale] [N/A:N/A] Necrotic Amount: [5:Small (1-33%)] [N/A:N/A] Exposed Structures: Fascia: No N/A N/A Fat: No Tendon: No Muscle: No Joint: No Bone: No Limited to Skin Breakdown Epithelialization: None N/A N/A Periwound Skin Texture: Edema: Yes N/A N/A Excoriation: No Induration: No Callus: No Crepitus: No Fluctuance: No Friable: No Rash: No Scarring: No Periwound Skin Moist: Yes N/A  N/A Moisture: Maceration: No Dry/Scaly: No Periwound Skin Color: Mottled: Yes N/A N/A Atrophie Blanche: No Cyanosis: No Ecchymosis: No Erythema: No Hemosiderin Staining: No Pallor: No Rubor: No Temperature: No Abnormality N/A N/A Tenderness on No N/A N/A Palpation: Wound Preparation: Ulcer Cleansing: Other: N/A N/A soap and water Topical Anesthetic Applied: Other: lidocaine 4% Treatment Notes Electronic Signature(s) Signed: 02/28/2015 1:36:26 PM By: Elpidio Eric BSN, RN Entered By: Elpidio Eric on 02/28/2015 13:36:26 [name], [name] Noa ([id number]) -------------------------------------------------------------------------------- Multi-Disciplinary Care Plan Details Patient Name: KALEEL, PLETT. Date of Service: 02/28/2015 1:00 PM Medical Record Number: [id number] Patient Account Number: [id number] Date of Birth/Sex: Jul 28, 1935 (79 y.o. Male) Treating RN: Clover Mealy, RN, BSN, [place] Sink Primary Care Physician: Terance Hart, DAVID Other Clinician: Referring Physician: Terance Hart, DAVID Treating Physician/Extender: Rudene Re in Treatment: 6 Active Inactive Orientation to the Wound Care Program Nursing Diagnoses: Knowledge deficit related to the wound healing center program Goals: Patient/caregiver will verbalize understanding of the Wound Healing Center Program Date Initiated: 01/15/2015 Goal Status: Active Interventions: Provide education on orientation to the wound center Notes: Venous Leg Ulcer Nursing Diagnoses: Knowledge deficit related to disease process and management Potential for venous Insuffiency (use before diagnosis confirmed) Goals: Patient will maintain optimal edema control Date Initiated: 01/15/2015 Goal Status: Active Patient/caregiver will verbalize understanding of disease process and disease management Date Initiated: 01/15/2015 Goal Status: Active Verify adequate tissue perfusion prior to therapeutic compression application Date Initiated:  01/15/2015 Goal Status: Active Interventions: Assess peripheral edema status every visit. Compression as ordered Provide education on venous insufficiency MIKIAS, NEUBER ([id number]) Treatment Activities: Test ordered outside of clinic : 02/28/2015 Therapeutic compression applied : 02/28/2015 Venous Duplex Doppler : 02/28/2015 Notes: Wound/Skin Impairment Nursing Diagnoses: Impaired tissue integrity Knowledge deficit related to ulceration/compromised skin integrity Goals: Patient/caregiver will verbalize understanding of skin care regimen Date Initiated: 01/15/2015 Goal Status: Active Ulcer/skin breakdown will have a volume reduction of 30% by week 4 Date Initiated: 01/15/2015 Goal Status: Active Ulcer/skin breakdown will have a volume reduction of 50% by week 8 Date Initiated: 01/15/2015 Goal Status: Active Ulcer/skin breakdown will have a volume reduction of 80% by week 12 Date Initiated: 01/15/2015 Goal Status: Active Ulcer/skin breakdown will heal within 14 weeks Date Initiated: 01/15/2015 Goal Status: Active Interventions: Assess patient/caregiver ability to perform ulcer/skin care regimen upon admission and as needed Assess ulceration(s) every visit Provide education on ulcer and skin care Notes: Electronic Signature(s) Signed: 02/28/2015 1:36:19 PM By: Elpidio Eric BSN, RN Entered By: Elpidio Eric on 02/28/2015 13:36:19 [name], [name] ([id number]) -------------------------------------------------------------------------------- Pain Assessment Details Patient Name: [name] Date of Service: 02/28/2015 1:00 PM Medical Record Number: [id number] Patient Account Number: [id number] Date of Birth/Sex: August 06, 1935 (79  y.o. Male) Treating RN: Afful, RN, BSN, [place] Sink Primary Care Physician: Dorothey Baseman Other Clinician: Referring Physician: Dorothey Baseman Treating Physician/Extender: Rudene Re in Treatment: 6 Active Problems Location of Pain Severity  and Description of Pain Patient Has Paino No Site Locations Pain Management and Medication Current Pain Management: Electronic Signature(s) Signed: 02/28/2015 1:22:35 PM By: Elpidio Eric BSN, RN Entered By: Elpidio Eric on 02/28/2015 13:22:35 [name] ([id number]) -------------------------------------------------------------------------------- Patient/Caregiver Education Details Patient Name: [name], [name] Date of Service: 02/28/2015 1:00 PM Medical Record Number: [id number] Patient Account Number: [id number] Date of Birth/Gender: Feb 25, 1936 (79 y.o. Male) Treating RN: Clover Mealy, RN, BSN, [place] Sink Primary Care Physician: Terance Hart, DAVID Other Clinician: Referring Physician: Dorothey Baseman Treating Physician/Extender: Rudene Re in Treatment: 6 Education Assessment Education Provided To: Patient and Caregiver nursing home Education Topics Provided Venous: Methods: Printed Welcome To The Wound Care Center: Methods: Printed Wound/Skin Impairment: Methods: Clinical cytogeneticist) Signed: 02/28/2015 2:08:11 PM By: Elpidio Eric BSN, RN Entered By: Elpidio Eric on 02/28/2015 14:08:11 [name], [name] ([id number]) -------------------------------------------------------------------------------- Wound Assessment Details Patient Name: [name], [name] Date of Service: 02/28/2015 1:00 PM Medical Record Number: [id number] Patient Account Number: [id number] Date of Birth/Sex: 26-Dec-1935 (79 y.o. Male) Treating RN: Clover Mealy, RN, BSN, [place] Primary Care Physician: Terance Hart, DAVID Other Clinician: Referring Physician: Terance Hart, DAVID Treating Physician/Extender: Rudene Re in Treatment: 6 Wound Status Wound Number: 5 Primary Venous Leg Ulcer Etiology: Wound Location: Right Lower Leg - Medial Wound Status: Open Wounding Event: Gradually Appeared Comorbid Cataracts, Anemia, Hypertension, Date Acquired: 11/13/2014 History: Osteoarthritis Weeks Of Treatment:  6 Clustered Wound: No Photos Photo Uploaded By: Elpidio Eric on 02/28/2015 15:31:19 Wound Measurements Length: (cm) 11.5 Width: (cm) 5.7 Depth: (cm) 0.2 Area: (cm) 51.483 Volume: (cm) 10.297 % Reduction in Area: 49.8% % Reduction in Volume: 66.5% Epithelialization: None Tunneling: No Undermining: No Wound Description Full Thickness Without Exposed Classification: Support Structures Wound Margin: Distinct, outline attached Exudate Large Amount: Exudate Type: Serosanguineous Exudate Color: red, brown Foul Odor After Cleansing: Yes Due to Product Use: No Wound Bed Granulation Amount: Large (67-100%) Exposed Structure Granulation Quality: Pink, Pale Fascia Exposed: No Necrotic Amount: Small (1-33%) Fat Layer Exposed: No [name], [name] ([id number]) Necrotic Quality: Adherent Slough Tendon Exposed: No Muscle Exposed: No Joint Exposed: No Bone Exposed: No Limited to Skin Breakdown Periwound Skin Texture Texture Color No Abnormalities Noted: No No Abnormalities Noted: No Callus: No Atrophie Blanche: No Crepitus: No Cyanosis: No Excoriation: No Ecchymosis: No Fluctuance: No Erythema: No Friable: No Hemosiderin Staining: No Induration: No Mottled: Yes Localized Edema: Yes Pallor: No Rash: No Rubor: No Scarring: No Temperature / Pain Moisture Temperature: No Abnormality No Abnormalities Noted: No Dry / Scaly: No Maceration: No Moist: Yes Wound Preparation Ulcer Cleansing: Other: soap and water, Topical Anesthetic Applied: Other: lidocaine 4%, Treatment Notes Wound #5 (Right, Medial Lower Leg) 1. Cleansed with: Cleanse wound with antibacterial soap and water 3. Peri-wound Care: Barrier cream 4. Dressing Applied: Hydrafera Blue 5. Secondary Dressing Applied ABD Pad 7. Secured with Henriette Combs to Right Lower Extremity Notes xtrasorb Electronic Signature(s) Signed: 02/28/2015 1:35:21 PM By: Elpidio Eric BSN, RN Entered By: Elpidio Eric on  02/28/2015 13:35:21 [name], [name] ([id number]) [name], [name] ([id number]) -------------------------------------------------------------------------------- Vitals Details Patient Name: [name], [name] Date of Service: 02/28/2015 1:00 PM Medical Record Number: [id number] Patient Account Number: [id number] Date of Birth/Sex: 1935-10-13 (78 y.o. Male) Treating RN: Clover Mealy, RN, BSN,  Sink Primary Care Physician: Dorothey Baseman Other Clinician: Referring Physician: Dorothey Baseman Treating Physician/Extender:  Britto, Errol Weeks in Treatment: 6 Vital Signs Time Taken: 13:25 Temperature (F): 98.1 Height (in): 68 Pulse (bpm): 72 Respiratory Rate (breaths/min): 19 Blood Pressure (mmHg): 140/67 Reference Range: 80 - 120 mg / dl Electronic Signature(s) Signed: 02/28/2015 1:26:11 PM By: Elpidio Eric BSN, RN Entered By: Elpidio Eric on 02/28/2015 13:26:11

## 2015-03-05 LAB — WOUND CULTURE: GRAM STAIN: NONE SEEN

## 2015-03-07 ENCOUNTER — Encounter: Payer: Medicare Other | Attending: Surgery | Admitting: Surgery

## 2015-03-07 DIAGNOSIS — L97313 Non-pressure chronic ulcer of right ankle with necrosis of muscle: Secondary | ICD-10-CM | POA: Insufficient documentation

## 2015-03-07 DIAGNOSIS — I890 Lymphedema, not elsewhere classified: Secondary | ICD-10-CM | POA: Insufficient documentation

## 2015-03-07 DIAGNOSIS — I87311 Chronic venous hypertension (idiopathic) with ulcer of right lower extremity: Secondary | ICD-10-CM | POA: Insufficient documentation

## 2015-03-07 DIAGNOSIS — I10 Essential (primary) hypertension: Secondary | ICD-10-CM | POA: Insufficient documentation

## 2015-03-07 DIAGNOSIS — G20 Parkinson's disease: Secondary | ICD-10-CM | POA: Insufficient documentation

## 2015-03-08 NOTE — Progress Notes (Signed)
INEZ, ROSATO ([id number]) Visit Report for 03/07/2015 Chief Complaint Document Details Patient Name: [name], [name] Date of Service: 03/07/2015 1:00 PM Medical Record Number: [id number] Patient Account Number: [id number] Date of Birth/Sex: 1936/04/02 (79 y.o. Male) Treating RN: Curtis Sites Primary Care Physician: Dorothey Baseman Other Clinician: Referring Physician: Dorothey Baseman Treating Physician/Extender: Rudene Re in Treatment: 7 Information Obtained from: Patient Chief Complaint Patient presents for treatment of an open ulcer due to venous insufficiency. The patient has had a open wound on his right medial ankle for at least 3 years and was last seen in the wound clinic in February 2015. He was lost to follow-up after that. Electronic Signature(s) Signed: 03/07/2015 1:48:59 PM By: Evlyn Kanner MD, FACS Entered By: Evlyn Kanner on 03/07/2015 13:48:59 [name], [name] ([id number]) -------------------------------------------------------------------------------- HPI Details Patient Name: [name], [name] Date of Service: 03/07/2015 1:00 PM Medical Record Number: [id number] Patient Account Number: [id number] Date of Birth/Sex: February 04, 1936 (79 y.o. Male) Treating RN: Curtis Sites Primary Care Physician: Dorothey Baseman Other Clinician: Referring Physician: Terance Hart, DAVID Treating Physician/Extender: Rudene Re in Treatment: 7 History of Present Illness Location: right lower extremity ulceration Quality: Patient reports experiencing a dull pain to affected area(s). Severity: Patient states wound are getting worse. 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 Modifying Factors: Consults to this date include:has received treatment in the wound center in the past but stopped coming here since February 2015 Associated Signs and Symptoms: Patient reports having difficulty standing for long periods. HPI Description: 79 year old male who is known to have progressive weakness and has been in a nursing home for a while has had chronic lower extremity edema both legs and a large open wound on the right lower extremity which is has at least for about 3-4 years. The patient was seen in the wound clinic before and has been treated until February 2015 when he was lost to follow-up. Past medical history is significant for hypertension, gout, venous stasis ulcers, Parkinson's [name], constipation. He's not been a diabetic but his last hemoglobin A1c was 6 in March 2015. There is documentation that he's had endovenous ablation of bilateral varicose veins but we have no documentation about this and this may be done at least 4-5 years ago. 01/22/2015 -- he has his vascular test is scheduled for this afternoon. 01/29/2015 -- the patient's vascular test was canceled last week because he was unable to get onto the examining bed at [facility]. His Unna's boot was not applied either and the patient had a dressing placed by home health. Today when his dressing was removed we found a lot of maggots in his right lower extremity. 02/07/2015 -- Was seen in the vascular office by the PA and she noted that a bilateral venous duplex study did not show any DVT, SVT or reflux bilaterally. The patient was advised to continue with Unna's boot and would at some stage to require graduated compression stockings of the 20-30 mmHg variety. In the future lymphedema pumps would also be considered. 02/14/2015 -- his dressing is smelling quite a bit and there is a discoloration of the wound suggestive of an infection. Deep tissue cultures will be taken today. 02/21/2015 --  the culture  is back and it has growth moderate growth of Proteus and gram-negative rods sensitive to sulfa, ampicillin, cephazolin, Zosyn. He is already on doxycycline and his wound is looking clean and hence we will continue with this. 02/28/2015 -- he's been doing fine still on his anti-buttocks and has no fresh issues. 03/07/2015 -- I was told that because he lives in a nursing home the Apligraf was denied by his insurance company. Electronic Signature(s) [name], [name] ([id number]) Signed: 03/07/2015 1:52:03 PM By: Evlyn Kanner MD, FACS Entered By: Evlyn Kanner on 03/07/2015 13:52:03 [name], [name] ([id number]) -------------------------------------------------------------------------------- Physical Exam Details Patient Name: [name], [name] Date of Service: 03/07/2015 1:00 PM Medical Record Number: [id number] Patient Account Number: [id number] Date of Birth/Sex: 1936-05-23 (79 y.o. Male) Treating RN: Curtis Sites Primary Care Physician: Dorothey Baseman Other Clinician: Referring Physician: Terance Hart, DAVID Treating Physician/Extender: Rudene Re in Treatment: 7 Constitutional . Pulse regular. Respirations normal and unlabored. Afebrile. . Eyes Nonicteric. Reactive to light. Ears, Nose, Mouth, and Throat Lips, teeth, and gums WNL.Marland Kitchen Moist mucosa without lesions . Neck supple and nontender. No palpable supraclavicular or cervical adenopathy. Normal sized without goiter. Respiratory WNL. No retractions.. Cardiovascular Pedal Pulses WNL. No clubbing, cyanosis or edema. Chest Breasts symmetical and no nipple discharge.. Breast tissue WNL, no masses, lumps, or tenderness.. Lymphatic No adneopathy. No adenopathy. No adenopathy. Musculoskeletal Adexa without tenderness or enlargement.. Digits and nails w/o clubbing, cyanosis, infection, petechiae, ischemia, or inflammatory conditions.. Integumentary (Hair, Skin) No suspicious lesions. No crepitus or fluctuance. No  peri-wound warmth or erythema. No masses.Marland Kitchen Psychiatric Judgement and insight Intact.. No evidence of depression, anxiety, or agitation.. Notes The wound is looking healthy and has got some good granulation tissue in the surrounding cellulitis has subsided. Electronic Signature(s) Signed: 03/07/2015 1:52:32 PM By: Evlyn Kanner MD, FACS Entered By: Evlyn Kanner on 03/07/2015 13:52:31 [name], [name] ([id number]) -------------------------------------------------------------------------------- Physician Orders Details Patient Name: [name], [name] Date of Service: 03/07/2015 1:00 PM Medical Record Number: [id number] Patient Account Number: [id number] Date of Birth/Sex: [birth year] (79 y.o. Male) Treating RN: Curtis Sites Primary Care Physician: Dorothey Baseman Other Clinician: Referring Physician: Dorothey Baseman Treating Physician/Extender: Rudene Re in Treatment: 7 Verbal / Phone Orders: Yes Clinician: Curtis Sites Read Back and Verified: Yes Diagnosis Coding Wound Cleansing Wound #5 Right,Medial Lower Leg o Cleanse wound with mild soap and water o May shower with protection. o No tub bath. Skin Barriers/Peri-Wound Care Wound #5 Right,Medial Lower Leg o Barrier cream Primary Wound Dressing Wound #5 Right,Medial Lower Leg o Hydrafera Blue Secondary Dressing Wound #5 Right,Medial Lower Leg o ABD pad o XtraSorb Dressing Change Frequency Wound #5 Right,Medial Lower Leg o Change dressing every week o Other: - As needed for drainage Follow-up Appointments Wound #5 Right,Medial Lower Leg o Return Appointment in 1 week. Edema Control Wound #5 Right,Medial Lower Leg o Unna Boot to Right Lower Extremity Additional Orders / Instructions Wound #5 Right,Medial Lower Leg [name], [name] ([id number]) o Other: - SNF nurse may change unna wrap as needed for copius drainage or odor. SNF nurse is to call [facility] prior to  changing unna wrap for instruction r/t improper unna wrap Please call us at [phone] to be sure wrap is applied correctly prior to changing wrap. Speak with Selena Batten or Mardene Celeste Electronic Signature(s) Signed: 03/07/2015 4:57:18 PM By: Evlyn Kanner MD, FACS Signed: 03/07/2015 5:38:26 PM By: Curtis Sites Entered By: Curtis Sites on 03/07/2015 13:37:07 [name], [name] ([id number]) -------------------------------------------------------------------------------- Problem  List Details Patient Name: [name], [name] Date of Service: 03/07/2015 1:00 PM Medical Record Number: [id number] Patient Account Number: [id number] Date of Birth/Sex: 1935-09-08 (79 y.o. 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 Date of Service: 03/07/2015 1:00 PM Medical Record Number: [id number] Patient Account Number: [id number] Date of Birth/Sex: [birth year] (79 y.o. Male) Treating RN: Curtis Sites Primary Care Physician: Dorothey Baseman Other Clinician: Referring Physician: Dorothey Baseman Treating Physician/Extender: Rudene Re in Treatment:  7 Subjective Chief Complaint Information obtained from Patient Patient presents for treatment of an open ulcer due to venous insufficiency. The patient has had a open wound on his right medial ankle for at least 3 years and was last seen in the wound clinic in February 2015. He was lost to follow-up after that. History of Present Illness (HPI) The following HPI elements were documented for the patient's wound: Location: right lower extremity ulceration Quality: Patient reports experiencing a dull pain to affected area(s). Severity: Patient states wound are getting worse. 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 Modifying Factors: Consults to this date include:has received treatment in the wound center in the past but stopped coming here since February 2015 Associated Signs and Symptoms: Patient reports having difficulty standing for long periods. 79 year old male who is known to have progressive weakness and has been in a nursing home for a while has had chronic lower extremity edema both legs and a large open wound on the right lower extremity which is has at least for about 3-4 years. The patient was seen in the wound clinic before and has been treated until February 2015 when he was lost to follow-up. Past medical history is significant for hypertension, gout, venous stasis ulcers, Parkinson's [name], constipation. He's not been a diabetic but his last hemoglobin A1c was 6 in March 2015. There is documentation that he's had endovenous ablation of bilateral varicose veins but we have no documentation about this and this may be done at least 4-5 years ago. 01/22/2015 -- he has his vascular test  is scheduled for this afternoon. 01/29/2015 -- the patient's vascular test was canceled last week because he was unable to get onto the examining bed at [facility]. His Unna's boot was not applied either and the patient had a dressing placed by home health. Today when his dressing was removed we found a lot of maggots in his right lower extremity. 02/07/2015 -- Was seen in the vascular office by the PA and she noted that a bilateral venous duplex study did not show any DVT, SVT or reflux bilaterally. The patient was advised  to continue with Unna's boot and would at some stage to require graduated compression stockings of the 20-30 mmHg variety. In the future lymphedema pumps would also be considered. 02/14/2015 -- his dressing is smelling quite a bit and there is a discoloration of the wound suggestive of an LUVERNE, RANDO. ([id number]) infection. Deep tissue cultures will be taken today. 02/21/2015 -- the culture is back and it has growth moderate growth of Proteus and gram-negative rods sensitive to sulfa, ampicillin, cephazolin, Zosyn. He is already on doxycycline and his wound is looking clean and hence we will continue with this. 02/28/2015 -- he's been doing fine still on his anti-buttocks and has no fresh issues. 03/07/2015 -- I was told that because he lives in a nursing home the Apligraf was denied by his insurance company. Objective Constitutional Pulse regular. Respirations normal and unlabored. Afebrile. Vitals Time Taken: 1:16 PM, Height: 68 in, Temperature: 98.3 F, Pulse: 68 bpm, Respiratory Rate: 20 breaths/min, Blood Pressure: 150/59 mmHg. Eyes Nonicteric. Reactive to light. Ears, Nose, Mouth, and Throat Lips, teeth, and gums WNL.Marland Kitchen Moist mucosa without lesions . Neck supple and nontender. No palpable supraclavicular or cervical adenopathy. Normal sized without goiter. Respiratory WNL. No retractions.. Cardiovascular Pedal Pulses WNL. No clubbing, cyanosis or  edema. Chest Breasts symmetical and no nipple discharge.. Breast tissue WNL, no masses, lumps, or tenderness.. Lymphatic No adneopathy. No adenopathy. No adenopathy. Musculoskeletal Adexa without tenderness or enlargement.. Digits and nails w/o clubbing, cyanosis, infection, petechiae, ischemia, or inflammatory conditions.Marland Kitchen Psychiatric Judgement and insight Intact.. No evidence of depression, anxiety, or agitation.Marland Kitchen RHILEY, SCHRANDT ([id number]) General Notes: The wound is looking healthy and has got some good granulation tissue in the surrounding cellulitis has subsided. Integumentary (Hair, Skin) No suspicious lesions. No crepitus or fluctuance. No peri-wound warmth or erythema. No masses.. Wound #5 status is Open. Original cause of wound was Gradually Appeared. The wound is located on the Right,Medial Lower Leg. The wound measures 9.8cm length x 5.5cm width x 0.2cm depth; 42.333cm^2 area and 8.467cm^3 volume. The wound is limited to skin breakdown. There is no tunneling or undermining noted. There is a large amount of serosanguineous drainage noted. The wound margin is distinct with the outline attached to the wound base. There is large (67-100%) pink, pale granulation within the wound bed. There is a small (1-33%) amount of necrotic tissue within the wound bed including Adherent Slough. The periwound skin appearance exhibited: Localized Edema, Moist, Mottled. The periwound skin appearance did not exhibit: Callus, Crepitus, Excoriation, Fluctuance, Friable, Induration, Rash, Scarring, Dry/Scaly, Maceration, Atrophie Blanche, Cyanosis, Ecchymosis, Hemosiderin Staining, Pallor, Rubor, Erythema. Periwound temperature was noted as No Abnormality. 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 However I understand because he is in a nursing home his Apligraf was denied by the insurance company. I'm going to attempt to get samples of. Puraply to his wound to see if it'll help his wound healing. We will continue with the Unna's boot and Hydrofera Blue at the present time. Plan Wound Cleansing: Wound #5 Right,Medial Lower Leg: Cleanse wound with mild soap and water May shower with protection. No tub bath. Skin Barriers/Peri-Wound Care: MICHAEAL, TYLUTKI ([id number]) Wound #5 Right,Medial Lower Leg: Barrier cream Primary  Wound Dressing: Wound #5 Right,Medial Lower Leg: Hydrafera Blue Secondary Dressing: Wound #5 Right,Medial Lower Leg: ABD pad XtraSorb Dressing Change Frequency: Wound #5 Right,Medial Lower Leg: Change dressing every week Other: - As needed for drainage Follow-up Appointments: Wound #5 Right,Medial Lower Leg: Return Appointment in 1 week. Edema Control: Wound #5 Right,Medial Lower Leg: Unna Boot to Right Lower Extremity Additional Orders / Instructions: Wound #5 Right,Medial Lower Leg: Other: - SNF nurse may change unna wrap as needed for copius drainage or odor. SNF nurse is to call [facility] prior to changing unna wrap for instruction r/t improper unna wrap Please call us at [phone] to be sure wrap is applied correctly prior to changing wrap. Speak with Selena Batten or Mardene Celeste The patient's wound is looking very clean and healthy and has got healthy granulation tissue. However I understand because he is in a nursing home his Apligraf was denied by the insurance company. I'm going to attempt to get samples of. Puraply to his wound to see if it'll help his wound healing. We will continue with the Unna's boot and Hydrofera Blue at the present time. Electronic Signature(s) Signed: 03/07/2015 1:54:12 PM  By: Evlyn Kanner MD, FACS Entered By: Evlyn Kanner on 03/07/2015 13:54:12 Lanell, Dubie Chrissie [name] ([id number]) -------------------------------------------------------------------------------- SuperBill Details Patient Name: BRAXSTON, QUINTER. Date of Service: 03/07/2015 Medical Record Number: [id number] Patient Account Number: [id number] Date of Birth/Sex: [birth year] (79 y.o. 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

## 2015-03-08 NOTE — Progress Notes (Signed)
ARVON, SCHREINER ([id number]) Visit Report for 03/07/2015 Arrival Information Details Patient Name: [name], [name] Date of Service: 03/07/2015 1:00 PM Medical Record Number: [id number] Patient Account Number: [id number] Date of Birth/Sex: 01/27/1936 (79 y.o. Male) Treating RN: Curtis Sites Primary Care Physician: Dorothey Baseman Other Clinician: Referring Physician: Terance Hart, DAVID Treating Physician/Extender: Rudene Re in Treatment: 7 Visit Information History Since Last Visit Added or deleted any medications: No Patient Arrived: Wheel Chair Any new allergies or adverse reactions: No Arrival Time: 13:12 Had a fall or experienced change in No activities of daily living that may affect Accompanied By: self risk of falls: Transfer Assistance: Hoyer Lift Signs or symptoms of abuse/neglect since last No Patient Identification Verified: Yes visito Secondary Verification Process Yes Hospitalized since last visit: No Completed: Pain Present Now: No Patient Requires Transmission-Based No Precautions: Patient Has Alerts: No Electronic Signature(s) Signed: 03/07/2015 5:38:26 PM By: Curtis Sites Entered By: Curtis Sites on 03/07/2015 13:16:10 [name] ([id number]) -------------------------------------------------------------------------------- Encounter Discharge Information Details Patient Name: [name] Date of Service: 03/07/2015 1:00 PM Medical Record Number: [id number] Patient Account Number: [id number] Date of Birth/Sex: [birth year] (79 y.o. Male) Treating RN: Curtis Sites Primary Care Physician: Dorothey Baseman Other Clinician: Referring Physician: Dorothey Baseman Treating Physician/Extender: Rudene Re in Treatment: 7 Encounter Discharge Information Items Discharge Pain Level: 0 Discharge Condition: Stable Ambulatory Status: Wheelchair Discharge Destination: Nursing Home Transportation: Private Auto Accompanied By:  self Schedule Follow-up Appointment: Yes Medication Reconciliation completed No and provided to Patient/Care Ormand Senn: Provided on Clinical Summary of Care: 03/07/2015 Form Type Recipient Paper Patient [facility] Electronic Signature(s) Signed: 03/07/2015 5:38:26 PM By: Curtis Sites Previous Signature: 03/07/2015 1:52:22 PM Version By: Gwenlyn Perking Entered By: Curtis Sites on 03/07/2015 13:53:35 Rowland, Ericsson Chrissie Noa ([id number]) -------------------------------------------------------------------------------- Lower Extremity Assessment Details Patient Name: [name] Date of Service: 03/07/2015 1:00 PM Medical Record Number: [id number] Patient Account Number: [id number] Date of Birth/Sex: [birth year] (79 y.o. Male) Treating RN: Curtis Sites Primary Care Physician: Terance Hart, DAVID Other Clinician: Referring Physician: Terance Hart, DAVID Treating Physician/Extender: Rudene Re in Treatment: 7 Edema Assessment Assessed: [Left: No] [Right: No] E[Left: dema] [Right: :] Calf Left: Right: Point of Measurement: 40 cm From Medial Instep cm 39.4 cm Ankle Left: Right: Point of Measurement: 10 cm From Medial Instep cm 29 cm Vascular Assessment Pulses: Posterior Tibial Dorsalis Pedis Palpable: [Right:Yes] Extremity colors, hair growth, and conditions: Extremity Color: [Right:Hyperpigmented] Hair Growth on Extremity: [Right:No] Temperature of Extremity: [Right:Warm] Capillary Refill: [Right:< 3 seconds] Toe Nail Assessment Left: Right: Thick: Yes Discolored: Yes Deformed: Yes Improper Length and Hygiene: Yes Electronic Signature(s) Signed: 03/07/2015 5:38:26 PM By: Curtis Sites Entered By: Curtis Sites on 03/07/2015 13:23:59 [name] ([id number]) -------------------------------------------------------------------------------- Multi Wound Chart Details Patient Name: [name] Date of Service: 03/07/2015 1:00 PM Medical Record Number: [id number] Patient  Account Number: [id number] Date of Birth/Sex: 1936-05-02 (79 y.o. Male) Treating RN: Curtis Sites Primary Care Physician: Dorothey Baseman Other Clinician: Referring Physician: Terance Hart, DAVID Treating Physician/Extender: Rudene Re in Treatment: 7 Vital Signs Height(in): 68 Pulse(bpm): 68 Weight(lbs): Blood Pressure 150/59 (mmHg): Body Mass Index(BMI): Temperature(F): 98.3 Respiratory Rate 20 (breaths/min): Photos: [5:No Photos] [N/A:N/A] Wound Location: [5:Right Lower Leg - Medial] [N/A:N/A] Wounding Event: [5:Gradually Appeared] [N/A:N/A] Primary Etiology: [5:Venous Leg Ulcer] [N/A:N/A] Comorbid History: [5:Cataracts, Anemia, Hypertension, Osteoarthritis] [N/A:N/A] Date Acquired: [5:11/13/2014] [N/A:N/A] Weeks of Treatment: [5:7] [N/A:N/A] Wound Status: [5:Open] [N/A:N/A] Measurements L x W x D 9.8x5.5x0.2 [N/A:N/A] (cm) Area (cm) : [5:42.333] [N/A:N/A] Volume (cm) : [5:8.467] [N/A:N/A] %  Reduction in Area: [5:58.70%] [N/A:N/A] % Reduction in Volume: 72.50% [N/A:N/A] Classification: [5:Full Thickness Without Exposed Support Structures] [N/A:N/A] Exudate Amount: [5:Large] [N/A:N/A] Exudate Type: [5:Serosanguineous] [N/A:N/A] Exudate Color: [5:red, brown] [N/A:N/A] Foul Odor After [5:Yes] [N/A:N/A] Cleansing: Odor Anticipated Due to No [N/A:N/A] Product Use: Wound Margin: [5:Distinct, outline attached] [N/A:N/A] Granulation Amount: [5:Large (67-100%)] [N/A:N/A] Granulation Quality: [5:Pink, Pale] [N/A:N/A] Necrotic Amount: [5:Small (1-33%)] [N/A:N/A] Exposed Structures: Fascia: No N/A N/A Fat: No Tendon: No Muscle: No Joint: No Bone: No Limited to Skin Breakdown Epithelialization: Small (1-33%) N/A N/A Periwound Skin Texture: Edema: Yes N/A N/A Excoriation: No Induration: No Callus: No Crepitus: No Fluctuance: No Friable: No Rash: No Scarring: No Periwound Skin Moist: Yes N/A N/A Moisture: Maceration: No Dry/Scaly: No Periwound Skin  Color: Mottled: Yes N/A N/A Atrophie Blanche: No Cyanosis: No Ecchymosis: No Erythema: No Hemosiderin Staining: No Pallor: No Rubor: No Temperature: No Abnormality N/A N/A Tenderness on No N/A N/A Palpation: Wound Preparation: Ulcer Cleansing: Other: N/A N/A soap and water Topical Anesthetic Applied: Other: lidocaine 4% Treatment Notes Electronic Signature(s) Signed: 03/07/2015 5:38:26 PM By: Curtis Sites Entered By: Curtis Sites on 03/07/2015 13:36:21 SAQIB, CAZAREZ ([id number]) -------------------------------------------------------------------------------- Multi-Disciplinary Care Plan Details Patient Name: ARVEL, OQUINN. Date of Service: 03/07/2015 1:00 PM Medical Record Number: [id number] Patient Account Number: [id number] Date of Birth/Sex: 1936-03-18 (79 y.o. Male) Treating RN: Curtis Sites Primary Care Physician: Dorothey Baseman Other Clinician: Referring Physician: Dorothey Baseman Treating Physician/Extender: Rudene Re in Treatment: 7 Active Inactive Orientation to the Wound Care Program Nursing Diagnoses: Knowledge deficit related to the wound healing center program Goals: Patient/caregiver will verbalize understanding of the Wound Healing Center Program Date Initiated: 01/15/2015 Goal Status: Active Interventions: Provide education on orientation to the wound center Notes: Venous Leg Ulcer Nursing Diagnoses: Knowledge deficit related to disease process and management Potential for venous Insuffiency (use before diagnosis confirmed) Goals: Patient will maintain optimal edema control Date Initiated: 01/15/2015 Goal Status: Active Patient/caregiver will verbalize understanding of disease process and disease management Date Initiated: 01/15/2015 Goal Status: Active Verify adequate tissue perfusion prior to therapeutic compression application Date Initiated: 01/15/2015 Goal Status: Active Interventions: Assess peripheral edema status  every visit. Compression as ordered Provide education on venous insufficiency CUONG, MOORMAN ([id number]) Treatment Activities: Test ordered outside of clinic : 03/07/2015 Therapeutic compression applied : 03/07/2015 Venous Duplex Doppler : 03/07/2015 Notes: Wound/Skin Impairment Nursing Diagnoses: Impaired tissue integrity Knowledge deficit related to ulceration/compromised skin integrity Goals: Patient/caregiver will verbalize understanding of skin care regimen Date Initiated: 01/15/2015 Goal Status: Active Ulcer/skin breakdown will have a volume reduction of 30% by week 4 Date Initiated: 01/15/2015 Goal Status: Active Ulcer/skin breakdown will have a volume reduction of 50% by week 8 Date Initiated: 01/15/2015 Goal Status: Active Ulcer/skin breakdown will have a volume reduction of 80% by week 12 Date Initiated: 01/15/2015 Goal Status: Active Ulcer/skin breakdown will heal within 14 weeks Date Initiated: 01/15/2015 Goal Status: Active Interventions: Assess patient/caregiver ability to perform ulcer/skin care regimen upon admission and as needed Assess ulceration(s) every visit Provide education on ulcer and skin care Notes: Electronic Signature(s) Signed: 03/07/2015 5:38:26 PM By: Curtis Sites Entered By: Curtis Sites on 03/07/2015 13:35:39 [name] ([id number]) -------------------------------------------------------------------------------- Patient/Caregiver Education Details Patient Name: [name] Date of Service: 03/07/2015 1:00 PM Medical Record Number: [id number] Patient Account Number: [id number] Date of Birth/Gender: February 24, 1936 (79 y.o. Male) Treating RN: Curtis Sites Primary Care Physician: Dorothey Baseman Other Clinician: Referring Physician: Dorothey Baseman Treating Physician/Extender: Rudene Re in Treatment: 7 Education  Assessment Education Provided To: Patient Education Topics Provided Wound/Skin  Impairment: Handouts: Other: wound healing Methods: Explain/Verbal Responses: State content correctly Electronic Signature(s) Signed: 03/07/2015 5:38:26 PM By: Curtis Sites Entered By: Curtis Sites on 03/07/2015 13:53:49 Afton, [place] Chrissie Noa ([id number]) -------------------------------------------------------------------------------- Wound Assessment Details Patient Name: [name] Date of Service: 03/07/2015 1:00 PM Medical Record Number: [id number] Patient Account Number: [id number] Date of Birth/Sex: [birth year] (79 y.o. Male) Treating RN: Curtis Sites Primary Care Physician: Terance Hart, DAVID Other Clinician: Referring Physician: Terance Hart, DAVID Treating Physician/Extender: Rudene Re in Treatment: 7 Wound Status Wound Number: 5 Primary Venous Leg Ulcer Etiology: Wound Location: Right Lower Leg - Medial Wound Status: Open Wounding Event: Gradually Appeared Comorbid Cataracts, Anemia, Hypertension, Date Acquired: 11/13/2014 History: Osteoarthritis Weeks Of Treatment: 7 Clustered Wound: No Photos Photo Uploaded By: Curtis Sites on 03/07/2015 17:07:39 Wound Measurements Length: (cm) 9.8 Width: (cm) 5.5 Depth: (cm) 0.2 Area: (cm) 42.333 Volume: (cm) 8.467 % Reduction in Area: 58.7% % Reduction in Volume: 72.5% Epithelialization: Small (1-33%) Tunneling: No Undermining: No Wound Description Full Thickness Without Exposed Classification: Support Structures Wound Margin: Distinct, outline attached Exudate Large Amount: Exudate Type: Serosanguineous Exudate Color: red, brown Foul Odor After Cleansing: Yes Due to Product Use: No Wound Bed Granulation Amount: Large (67-100%) Exposed Structure Granulation Quality: Pink, Pale Fascia Exposed: No Necrotic Amount: Small (1-33%) Fat Layer Exposed: No MEGA, BORGEN ([id number]) Necrotic Quality: Adherent Slough Tendon Exposed: No Muscle Exposed: No Joint Exposed: No Bone Exposed:  No Limited to Skin Breakdown Periwound Skin Texture Texture Color No Abnormalities Noted: No No Abnormalities Noted: No Callus: No Atrophie Blanche: No Crepitus: No Cyanosis: No Excoriation: No Ecchymosis: No Fluctuance: No Erythema: No Friable: No Hemosiderin Staining: No Induration: No Mottled: Yes Localized Edema: Yes Pallor: No Rash: No Rubor: No Scarring: No Temperature / Pain Moisture Temperature: No Abnormality No Abnormalities Noted: No Dry / Scaly: No Maceration: No Moist: Yes Wound Preparation Ulcer Cleansing: Other: soap and water, Topical Anesthetic Applied: Other: lidocaine 4%, Treatment Notes Wound #5 (Right, Medial Lower Leg) 1. Cleansed with: Clean wound with Normal Saline 2. Anesthetic Topical Lidocaine 4% cream to wound bed prior to debridement 4. Dressing Applied: Hydrafera Blue Other dressing (specify in notes) 5. Secondary Dressing Applied ABD Pad 7. Secured with Henriette Combs to Right Lower Extremity Notes xtrasorb Electronic Signature(s) Signed: 03/07/2015 5:38:26 PM By: Ebbie Ridge ([id number]) Entered By: Curtis Sites on 03/07/2015 13:25:15 DAWON, SOKOLOWSKI ([id number]) -------------------------------------------------------------------------------- Vitals Details Patient Name: ABRAR, LYNDE. Date of Service: 03/07/2015 1:00 PM Medical Record Number: [id number] Patient Account Number: [id number] Date of Birth/Sex: [birth year] (79 y.o. Male) Treating RN: Curtis Sites Primary Care Physician: Terance Hart, DAVID Other Clinician: Referring Physician: Terance Hart, DAVID Treating Physician/Extender: Rudene Re in Treatment: 7 Vital Signs Time Taken: 13:16 Temperature (F): 98.3 Height (in): 68 Pulse (bpm): 68 Respiratory Rate (breaths/min): 20 Blood Pressure (mmHg): 150/59 Reference Range: 80 - 120 mg / dl Electronic Signature(s) Signed: 03/07/2015 5:38:26 PM By: Curtis Sites Entered By: Curtis Sites on 03/07/2015 13:17:31

## 2015-03-14 ENCOUNTER — Encounter: Payer: Medicare Other | Admitting: Surgery

## 2015-03-14 DIAGNOSIS — I10 Essential (primary) hypertension: Secondary | ICD-10-CM | POA: Diagnosis not present

## 2015-03-14 DIAGNOSIS — I890 Lymphedema, not elsewhere classified: Secondary | ICD-10-CM | POA: Diagnosis not present

## 2015-03-14 DIAGNOSIS — G20 Parkinson's disease: Secondary | ICD-10-CM | POA: Diagnosis not present

## 2015-03-14 DIAGNOSIS — L97313 Non-pressure chronic ulcer of right ankle with necrosis of muscle: Secondary | ICD-10-CM | POA: Diagnosis not present

## 2015-03-14 DIAGNOSIS — I87311 Chronic venous hypertension (idiopathic) with ulcer of right lower extremity: Secondary | ICD-10-CM | POA: Diagnosis not present

## 2015-03-15 NOTE — Progress Notes (Signed)
HYMIE, GORR ([id number]) Visit Report for 03/14/2015 Arrival Information Details Patient Name: [name], [name] Date of Service: 03/14/2015 1:00 PM Medical Record Number: [id number] Patient Account Number: [id number] Date of Birth/Sex: [birth year] (79 y.o. Male) Treating RN: Clover Mealy, RN, BSN, [place] Sink Primary Care Physician: Terance Hart, DAVID Other Clinician: Referring Physician: Terance Hart, DAVID Treating Physician/Extender: Rudene Re in Treatment: 8 Visit Information History Since Last Visit Added or deleted any medications: No Patient Arrived: Wheel Chair Any new allergies or adverse reactions: No Arrival Time: 13:07 Had a fall or experienced change in No activities of daily living that may affect Accompanied By: self risk of falls: Transfer Assistance: None Signs or symptoms of abuse/neglect since last No Patient Identification Verified: Yes visito Secondary Verification Process Yes Hospitalized since last visit: No Completed: Has Dressing in Place as Prescribed: Yes Patient Requires Transmission-Based No Has Compression in Place as Prescribed: Yes Precautions: Pain Present Now: No Patient Has Alerts: No Electronic Signature(s) Signed: 03/14/2015 1:10:39 PM By: Elpidio Eric BSN, RN Entered By: Elpidio Eric on 03/14/2015 13:10:39 [name] ([id number]) -------------------------------------------------------------------------------- Encounter Discharge Information Details Patient Name: [name] Date of Service: 03/14/2015 1:00 PM Medical Record Number: [id number] Patient Account Number: [id number] Date of Birth/Sex: Mar 02, 1936 (79 y.o. Male) Treating RN: Clover Mealy, RN, BSN, [place] Sink Primary Care Physician: Terance Hart, DAVID Other Clinician: Referring Physician: Terance Hart, DAVID Treating Physician/Extender: Rudene Re in Treatment: 8 Encounter Discharge Information Items Discharge Pain Level: 0 Discharge Condition: Stable Ambulatory Status:  Wheelchair Discharge Destination: Nursing Home Transportation: Other Accompanied By: self Schedule Follow-up Appointment: No Medication Reconciliation completed No and provided to Patient/Care Loyalty Brashier: Provided on Clinical Summary of Care: 03/14/2015 Form Type Recipient Paper Patient [facility] Electronic Signature(s) Signed: 03/14/2015 1:28:35 PM By: Elpidio Eric BSN, RN Previous Signature: 03/14/2015 1:26:39 PM Version By: Gwenlyn Perking Entered By: Elpidio Eric on 03/14/2015 13:28:35 [name] ([id number]) -------------------------------------------------------------------------------- Lower Extremity Assessment Details Patient Name: [name] Date of Service: 03/14/2015 1:00 PM Medical Record Number: [id number] Patient Account Number: [id number] Date of Birth/Sex: 26-Mar-1936 (79 y.o. Male) Treating RN: Clover Mealy, RN, BSN, [place] Sink Primary Care Physician: Terance Hart, DAVID Other Clinician: Referring Physician: Terance Hart, DAVID Treating Physician/Extender: Rudene Re in Treatment: 8 Edema Assessment Assessed: [Left: No] [Right: No] E[Left: dema] [Right: :] Calf Left: Right: Point of Measurement: 40 cm From Medial Instep cm 39.6 cm Ankle Left: Right: Point of Measurement: 10 cm From Medial Instep cm 28.9 cm Vascular Assessment Claudication: Claudication Assessment [Right:None] Pulses: Posterior Tibial Dorsalis Pedis Palpable: [Right:Yes] Extremity colors, hair growth, and conditions: Extremity Color: [Right:Hyperpigmented] Hair Growth on Extremity: [Right:No] Temperature of Extremity: [Right:Warm] Capillary Refill: [Right:< 3 seconds] Toe Nail Assessment Left: Right: Thick: Yes Discolored: Yes Deformed: Yes Improper Length and Hygiene: Yes Electronic Signature(s) Signed: 03/14/2015 1:15:12 PM By: Elpidio Eric BSN, RN Entered By: Elpidio Eric on 03/14/2015 13:15:12 [name], [name] ([id number]) [name], [name]  ([id number]) -------------------------------------------------------------------------------- Multi Wound Chart Details Patient Name: [name] Date of Service: 03/14/2015 1:00 PM Medical Record Number: [id number] Patient Account Number: [id number] Date of Birth/Sex: [birth year] (79 y.o. Male) Treating RN: Clover Mealy, RN, BSN, [place] Sink Primary Care Physician: Terance Hart, DAVID Other Clinician: Referring Physician: Terance Hart, DAVID Treating Physician/Extender: Rudene Re in Treatment: 8 Vital Signs Height(in): 68 Pulse(bpm): 70 Weight(lbs): Blood Pressure 162/71 (mmHg): Body Mass Index(BMI): Temperature(F): 97.7 Respiratory Rate 18 (breaths/min): Photos: [5:No Photos] [N/A:N/A] Wound Location: [5:Right Lower Leg - Medial] [N/A:N/A] Wounding Event: [5:Gradually Appeared] [N/A:N/A] Primary Etiology: [5:Venous Leg Ulcer] [N/A:N/A] Comorbid History: [5:Cataracts, Anemia, Hypertension, Osteoarthritis] [  N/A:N/A] Date Acquired: [5:11/13/2014] [N/A:N/A] Weeks of Treatment: [5:8] [N/A:N/A] Wound Status: [5:Open] [N/A:N/A] Measurements L x W x D 10x5.5x0.2 [N/A:N/A] (cm) Area (cm) : [5:43.197] [N/A:N/A] Volume (cm) : [5:8.639] [N/A:N/A] % Reduction in Area: [5:57.90%] [N/A:N/A] % Reduction in Volume: 71.90% [N/A:N/A] Classification: [5:Full Thickness Without Exposed Support Structures] [N/A:N/A] Exudate Amount: [5:Large] [N/A:N/A] Exudate Type: [5:Serosanguineous] [N/A:N/A] Exudate Color: [5:red, brown] [N/A:N/A] Foul Odor After [5:Yes] [N/A:N/A] Cleansing: Odor Anticipated Due to No [N/A:N/A] Product Use: Wound Margin: [5:Distinct, outline attached] [N/A:N/A] Granulation Amount: [5:Large (67-100%)] [N/A:N/A] Granulation Quality: [5:Pink, Pale] [N/A:N/A] Necrotic Amount: [5:Small (1-33%)] [N/A:N/A] Exposed Structures: Fascia: No N/A N/A Fat: No Tendon: No Muscle: No Joint: No Bone: No Limited to Skin Breakdown Epithelialization: Small (1-33%) N/A N/A Periwound  Skin Texture: Edema: Yes N/A N/A Excoriation: No Induration: No Callus: No Crepitus: No Fluctuance: No Friable: No Rash: No Scarring: No Periwound Skin Moist: Yes N/A N/A Moisture: Maceration: No Dry/Scaly: No Periwound Skin Color: Mottled: Yes N/A N/A Atrophie Blanche: No Cyanosis: No Ecchymosis: No Erythema: No Hemosiderin Staining: No Pallor: No Rubor: No Temperature: No Abnormality N/A N/A Tenderness on No N/A N/A Palpation: Wound Preparation: Ulcer Cleansing: Other: N/A N/A soap and water Topical Anesthetic Applied: None Treatment Notes Electronic Signature(s) Signed: 03/14/2015 1:18:35 PM By: Elpidio Eric BSN, RN Entered By: Elpidio Eric on 03/14/2015 13:18:35 [name] ([id number]) -------------------------------------------------------------------------------- Multi-Disciplinary Care Plan Details Patient Name: [name], [name] Date of Service: 03/14/2015 1:00 PM Medical Record Number: [id number] Patient Account Number: [id number] Date of Birth/Sex: 30-Jul-1935 (79 y.o. Male) Treating RN: Clover Mealy, RN, BSN, [place] Sink Primary Care Physician: Terance Hart, DAVID Other Clinician: Referring Physician: Terance Hart, DAVID Treating Physician/Extender: Rudene Re in Treatment: 8 Active Inactive Orientation to the Wound Care Program Nursing Diagnoses: Knowledge deficit related to the wound healing center program Goals: Patient/caregiver will verbalize understanding of the Wound Healing Center Program Date Initiated: 01/15/2015 Goal Status: Active Interventions: Provide education on orientation to the wound center Notes: Venous Leg Ulcer Nursing Diagnoses: Knowledge deficit related to disease process and management Potential for venous Insuffiency (use before diagnosis confirmed) Goals: Patient will maintain optimal edema control Date Initiated: 01/15/2015 Goal Status: Active Patient/caregiver will verbalize understanding of disease process and disease  management Date Initiated: 01/15/2015 Goal Status: Active Verify adequate tissue perfusion prior to therapeutic compression application Date Initiated: 01/15/2015 Goal Status: Active Interventions: Assess peripheral edema status every visit. Compression as ordered Provide education on venous insufficiency [name], [name] ([id number]) Treatment Activities: Test ordered outside of clinic : 03/14/2015 Therapeutic compression applied : 03/14/2015 Venous Duplex Doppler : 03/14/2015 Notes: Wound/Skin Impairment Nursing Diagnoses: Impaired tissue integrity Knowledge deficit related to ulceration/compromised skin integrity Goals: Patient/caregiver will verbalize understanding of skin care regimen Date Initiated: 01/15/2015 Goal Status: Active Ulcer/skin breakdown will have a volume reduction of 30% by week 4 Date Initiated: 01/15/2015 Goal Status: Active Ulcer/skin breakdown will have a volume reduction of 50% by week 8 Date Initiated: 01/15/2015 Goal Status: Active Ulcer/skin breakdown will have a volume reduction of 80% by week 12 Date Initiated: 01/15/2015 Goal Status: Active Ulcer/skin breakdown will heal within 14 weeks Date Initiated: 01/15/2015 Goal Status: Active Interventions: Assess patient/caregiver ability to perform ulcer/skin care regimen upon admission and as needed Assess ulceration(s) every visit Provide education on ulcer and skin care Notes: Electronic Signature(s) Signed: 03/14/2015 1:18:18 PM By: Elpidio Eric BSN, RN Entered By: Elpidio Eric on 03/14/2015 13:18:18 [name], [name] ([id number]) -------------------------------------------------------------------------------- Pain Assessment Details Patient Name: [name] Date of Service: 03/14/2015 1:00 PM Medical  Record Number: [id number] Patient Account Number: [id number] Date of Birth/Sex: July 19, 1935 (79 y.o. Male) Treating RN: Clover Mealy, RN, BSN, [place] Sink Primary Care Physician: Dorothey Baseman Other  Clinician: Referring Physician: Dorothey Baseman Treating Physician/Extender: Rudene Re in Treatment: 8 Active Problems Location of Pain Severity and Description of Pain Patient Has Paino No Site Locations Pain Management and Medication Current Pain Management: Electronic Signature(s) Signed: 03/14/2015 1:10:47 PM By: Elpidio Eric BSN, RN Entered By: Elpidio Eric on 03/14/2015 13:10:47 [name] ([id number]) -------------------------------------------------------------------------------- Patient/Caregiver Education Details Patient Name: [name] Date of Service: 03/14/2015 1:00 PM Medical Record Number: [id number] Patient Account Number: [id number] Date of Birth/Gender: Nov 16, 1935 (79 y.o. Male) Treating RN: Clover Mealy, RN, BSN, [place] Sink Primary Care Physician: Terance Hart, DAVID Other Clinician: Referring Physician: Terance Hart DAVID Treating Physician/Extender: Rudene Re in Treatment: 8 Education Assessment Education Provided To: Patient Education Topics Provided Basic Hygiene: Methods: Explain/Verbal Responses: State content correctly Venous: Methods: Explain/Verbal Responses: State content correctly Welcome To The Wound Care Center: Methods: Explain/Verbal Responses: State content correctly Wound/Skin Impairment: Methods: Explain/Verbal Responses: State content correctly Electronic Signature(s) Signed: 03/14/2015 1:28:56 PM By: Elpidio Eric BSN, RN Entered By: Elpidio Eric on 03/14/2015 13:28:56 [name], [name] ([id number]) -------------------------------------------------------------------------------- Wound Assessment Details Patient Name: [name] Date of Service: 03/14/2015 1:00 PM Medical Record Number: [id number] Patient Account Number: [id number] Date of Birth/Sex: 1935-12-01 (79 y.o. Male) Treating RN: Clover Mealy, RN, BSN, [place] Primary Care Physician: Terance Hart, DAVID Other Clinician: Referring Physician: Terance Hart, DAVID Treating  Physician/Extender: Rudene Re in Treatment: 8 Wound Status Wound Number: 5 Primary Venous Leg Ulcer Etiology: Wound Location: Right Lower Leg - Medial Wound Status: Open Wounding Event: Gradually Appeared Comorbid Cataracts, Anemia, Hypertension, Date Acquired: 11/13/2014 History: Osteoarthritis Weeks Of Treatment: 8 Clustered Wound: No Photos Photo Uploaded By: Elpidio Eric on 03/14/2015 16:35:35 Wound Measurements Length: (cm) 10 Width: (cm) 5.5 Depth: (cm) 0.2 Area: (cm) 43.197 Volume: (cm) 8.639 % Reduction in Area: 57.9% % Reduction in Volume: 71.9% Epithelialization: Small (1-33%) Tunneling: No Undermining: No Wound Description Full Thickness Without Exposed Classification: Support Structures Wound Margin: Distinct, outline attached Exudate Large Amount: Exudate Type: Serosanguineous Exudate Color: red, brown Foul Odor After Cleansing: Yes Due to Product Use: No Wound Bed Granulation Amount: Large (67-100%) Exposed Structure Granulation Quality: Pink, Pale Fascia Exposed: No Necrotic Amount: Small (1-33%) Fat Layer Exposed: No [name], [name] ([id number]) Necrotic Quality: Adherent Slough Tendon Exposed: No Muscle Exposed: No Joint Exposed: No Bone Exposed: No Limited to Skin Breakdown Periwound Skin Texture Texture Color No Abnormalities Noted: No No Abnormalities Noted: No Callus: No Atrophie Blanche: No Crepitus: No Cyanosis: No Excoriation: No Ecchymosis: No Fluctuance: No Erythema: No Friable: No Hemosiderin Staining: No Induration: No Mottled: Yes Localized Edema: Yes Pallor: No Rash: No Rubor: No Scarring: No Temperature / Pain Moisture Temperature: No Abnormality No Abnormalities Noted: No Dry / Scaly: No Maceration: No Moist: Yes Wound Preparation Ulcer Cleansing: Other: soap and water, Topical Anesthetic Applied: None Treatment Notes Wound #5 (Right, Medial Lower Leg) 1. Cleansed with: Cleanse wound with  antibacterial soap and water 3. Peri-wound Care: Moisturizing lotion 4. Dressing Applied: Hydrafera Blue 5. Secondary Dressing Applied Gauze and Kerlix/Conform 7. Secured with Henriette Combs to Right Lower Extremity Notes xtrasorb Electronic Signature(s) Signed: 03/14/2015 1:18:10 PM By: Elpidio Eric BSN, RN Entered By: Elpidio Eric on 03/14/2015 13:18:10 [name], [name] ([id number]) [name], [name] ([id number]) -------------------------------------------------------------------------------- Vitals Details Patient Name: [name], [name] Date of Service: 03/14/2015 1:00 PM Medical Record Number: [id number] Patient Account  Number: [id number] Date of Birth/Sex: 11/05/1935 (79 y.o. Male) Treating RN: Clover Mealy, RN, BSN, [place] Sink Primary Care Physician: Terance Hart, DAVID Other Clinician: Referring Physician: Terance Hart, DAVID Treating Physician/Extender: Rudene Re in Treatment: 8 Vital Signs Time Taken: 13:13 Temperature (F): 97.7 Height (in): 68 Pulse (bpm): 70 Respiratory Rate (breaths/min): 18 Blood Pressure (mmHg): 162/71 Reference Range: 80 - 120 mg / dl Electronic Signature(s) Signed: 03/14/2015 1:14:27 PM By: Elpidio Eric BSN, RN Entered By: Elpidio Eric on 03/14/2015 13:14:27

## 2015-03-15 NOTE — Progress Notes (Addendum)
WILLY, PINKERTON ([id number]) Visit Report for 03/14/2015 Chief Complaint Document Details Patient Name: [name], [name] Date of Service: 03/14/2015 1:00 PM Medical Record Number: [id number] Patient Account Number: [id number] Date of Birth/Sex: 10-13-1935 (79 y.o. Male) Treating RN: Clover Mealy, RN, BSN, [place] Sink Primary Care Physician: Terance Hart, DAVID Other Clinician: Referring Physician: Dorothey Baseman Treating Physician/Extender: Rudene Re in Treatment: 8 Information Obtained from: Patient Chief Complaint Patient presents for treatment of an open ulcer due to venous insufficiency. The patient has had a open wound on his right medial ankle for at least 3 years and was last seen in the wound clinic in February 2015. He was lost to follow-up after that. Electronic Signature(s) Signed: 03/14/2015 1:13:55 PM By: Evlyn Kanner MD, FACS Entered By: Evlyn Kanner on 03/14/2015 13:13:55 [name], [name] ([id number]) -------------------------------------------------------------------------------- HPI Details Patient Name: [name], [name] Date of Service: 03/14/2015 1:00 PM Medical Record Number: [id number] Patient Account Number: [id number] Date of Birth/Sex: September 10, 1935 (79 y.o. Male) Treating RN: Clover Mealy, RN, BSN, [place] Sink Primary Care Physician: Terance Hart, DAVID Other Clinician: Referring Physician: Terance Hart, DAVID Treating Physician/Extender: Rudene Re in Treatment: 8 History of Present Illness Location: right lower extremity ulceration Quality: Patient reports experiencing a dull pain to affected area(s). Severity: Patient states wound are getting worse. 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 Modifying Factors: Consults to this date include:has received treatment in the wound center in the past but stopped coming here since February 2015 Associated Signs and Symptoms: Patient reports having difficulty standing for long periods. HPI Description: 79 year old male who is known to have progressive weakness and has been in a nursing home for a while has had chronic lower extremity edema both legs and a large open wound on the right lower extremity which is has at least for about 3-4 years. The patient was seen in the wound clinic before and has been treated until February 2015 when he was lost to follow-up. Past medical history is significant for hypertension, gout, venous stasis ulcers, Parkinson's [name], constipation. He's not been a diabetic but his last hemoglobin A1c was 6 in March 2015. There is documentation that he's had endovenous ablation of bilateral varicose veins but we have no documentation about this and this may be done at least 4-5 years ago. 01/22/2015 -- he has his vascular test is scheduled for this afternoon. 01/29/2015 -- the patient's vascular test was canceled last week because he was unable to get onto the examining bed at [facility]. His Unna's boot was not applied either and the patient had a dressing placed by home health. Today when his dressing was removed we found a lot of maggots in his right lower extremity. 02/07/2015 -- Was seen in the vascular office by the PA and she noted that a bilateral venous duplex study did not show any DVT, SVT or reflux bilaterally. The patient was advised to continue with Unna's boot and would at some stage to require graduated compression stockings of the 20-30 mmHg variety. In the future lymphedema pumps would also be considered. 02/14/2015 -- his dressing is smelling quite a bit and there is a discoloration of the wound suggestive of an infection. Deep tissue cultures will  be taken today. 02/21/2015 -- the culture  is back and it has growth moderate growth of Proteus and gram-negative rods sensitive to sulfa, ampicillin, cephazolin, Zosyn. He is already on doxycycline and his wound is looking clean and hence we will continue with this. 02/28/2015 -- he's been doing fine still on his anti-buttocks and has no fresh issues. 03/07/2015 -- I was told that because he lives in a nursing home the Apligraf was denied by his insurance company. 03/14/2015 -- we are awaiting samples and a trial of Puraply to be tried for his ulceration. JUDEA, RICHES ([id number]) Electronic Signature(s) Signed: 03/14/2015 1:14:38 PM By: Evlyn Kanner MD, FACS Entered By: Evlyn Kanner on 03/14/2015 13:14:38 [name], [name] ([id number]) -------------------------------------------------------------------------------- Physical Exam Details Patient Name: [name], [name] Date of Service: 03/14/2015 1:00 PM Medical Record Number: [id number] Patient Account Number: [id number] Date of Birth/Sex: Mar 21, 1936 (79 y.o. Male) Treating RN: Clover Mealy, RN, BSN, [place] Sink Primary Care Physician: Terance Hart, DAVID Other Clinician: Referring Physician: Terance Hart, DAVID Treating Physician/Extender: Rudene Re in Treatment: 8 Constitutional . Pulse regular. Respirations normal and unlabored. Afebrile. . Eyes Nonicteric. Reactive to light. Ears, Nose, Mouth, and Throat Lips, teeth, and gums WNL.Marland Kitchen Moist mucosa without lesions . Neck supple and nontender. No palpable supraclavicular or cervical adenopathy. Normal sized without goiter. Respiratory WNL. No retractions.. Cardiovascular Pedal Pulses WNL. No clubbing, cyanosis or edema. Chest Breasts symmetical and no nipple discharge.. Breast tissue WNL, no masses, lumps, or tenderness.. Lymphatic No adneopathy. No adenopathy. No adenopathy. Musculoskeletal Adexa without tenderness or enlargement.. Digits and nails w/o clubbing, cyanosis, infection, petechiae, ischemia, or inflammatory  conditions.. Integumentary (Hair, Skin) No suspicious lesions. No crepitus or fluctuance. No peri-wound warmth or erythema. No masses.Marland Kitchen Psychiatric Judgement and insight Intact.. No evidence of depression, anxiety, or agitation.. Notes The wound is looking healthy and there is healthy granulation tissue with no cellulitis and minimal edema. Electronic Signature(s) Signed: 03/14/2015 1:15:06 PM By: Evlyn Kanner MD, FACS Entered By: Evlyn Kanner on 03/14/2015 13:15:06 [name], [name] ([id number]) -------------------------------------------------------------------------------- Physician Orders Details Patient Name: [name], [name] Date of Service: 03/14/2015 1:00 PM Medical Record Number: [id number] Patient Account Number: [id number] Date of Birth/Sex: 1935/09/21 (79 y.o. 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 o No tub bath. Skin Barriers/Peri-Wound Care Wound #5 Right,Medial Lower Leg o Barrier cream Primary Wound Dressing Wound #5 Right,Medial Lower Leg o Hydrafera Blue Secondary Dressing Wound #5 Right,Medial Lower Leg o ABD pad o XtraSorb Dressing Change Frequency Wound #5 Right,Medial Lower Leg o Change dressing every week o Other: - As needed for drainage Follow-up Appointments Wound  #5 Right,Medial Lower Leg o Return Appointment in 1 week. [name], [name] ([id number]) Edema Control Wound #5 Right,Medial Lower Leg o Unna Boot to Right Lower Extremity Additional Orders / Instructions Wound #5 Right,Medial Lower Leg o Other: - SNF nurse may change unna wrap as needed for copius drainage or odor. SNF nurse is to call [facility] prior to changing unna wrap for instruction r/t improper unna wrap Please call us at [phone] to be sure wrap is applied correctly prior to changing wrap. Speak with Selena Batten or Mardene Celeste Electronic Signature(s) Signed: 03/14/2015 1:18:57 PM By: Elpidio Eric BSN, RN Signed: 03/14/2015 4:23:40 PM By: Evlyn Kanner MD, FACS Entered By: Elpidio Eric on 03/14/2015 13:18:57 [name], [name] ([id number]) -------------------------------------------------------------------------------- Problem List Details Patient Name: [name], [name] Date of Service: 03/14/2015 1:00 PM Medical Record Number: [id number] Patient Account Number: [id number] Date of Birth/Sex: [birth year] (78 y.o. 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 Date of Service: 03/14/2015 1:00 PM Medical Record Number: [id number] Patient Account Number: [id number] Date of Birth/Sex: 03-07-1936 (79 y.o. Male) Treating RN: Clover Mealy, RN, BSN, [place] Sink Primary Care Physician: Terance Hart, DAVID Other Clinician: Referring Physician: Dorothey Baseman Treating Physician/Extender: Rudene Re in Treatment: 8 Subjective Chief Complaint Information obtained from Patient Patient presents for treatment of an open ulcer due to venous insufficiency. The patient has had a open wound on his right medial ankle for at least 3 years and was last seen in the wound clinic in February 2015. He was lost to follow-up after that. History of Present Illness (HPI) The following HPI elements were documented for the patient's wound: Location: right lower extremity ulceration Quality: Patient reports experiencing a dull pain to affected area(s). Severity: Patient states wound are getting worse. 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 Modifying Factors: Consults to this date include:has received treatment in the wound center in the past but stopped coming here since February 2015 Associated Signs and Symptoms: Patient reports having difficulty standing for long periods. 79 year old male who is known to have progressive weakness and has been in a nursing home for a while has had chronic lower extremity edema both legs and a large open wound on the right lower extremity which is has at least for about 3-4 years. The patient was seen in the  wound clinic before and has been treated until February 2015 when he was lost to follow-up. Past medical history is significant for hypertension, gout, venous stasis ulcers, Parkinson's [name], constipation. He's not been a diabetic but his last hemoglobin A1c was 6 in March 2015. There is documentation that he's had endovenous ablation of bilateral varicose veins but we have no documentation about this and this may be done at least 4-5 years ago. 01/22/2015 -- he has his vascular test is scheduled for this afternoon. 01/29/2015 -- the patient's vascular test was canceled last week because he was unable to get onto  the examining bed at [facility]. His Unna's boot was not applied either and the patient had a dressing placed by home health. Today when his dressing was removed we found a lot of maggots in his right lower extremity. 02/07/2015 -- Was seen in the vascular office by the PA and she noted that a bilateral venous duplex study did not show any DVT, SVT or reflux bilaterally. The patient was advised to continue with Unna's boot and would at some stage to require graduated compression stockings of the 20-30 mmHg variety. In the future lymphedema pumps would also be considered. 02/14/2015 -- his dressing is smelling quite a bit and there is a discoloration of the wound suggestive of an [name], [name] ([id number]) infection. Deep tissue cultures will be taken today. 02/21/2015 -- the culture is back and it has growth moderate growth of Proteus and gram-negative rods sensitive to sulfa, ampicillin, cephazolin, Zosyn. He is already on doxycycline and his wound is looking clean and hence we will continue with this. 02/28/2015 -- he's been doing fine still on his anti-buttocks and has no fresh issues. 03/07/2015 -- I was told that because he lives in a nursing home the Apligraf was denied by his insurance company. 03/14/2015 -- we are awaiting samples and a trial of Puraply to be tried for  his ulceration. Objective Constitutional Pulse regular. Respirations normal and unlabored. Afebrile. Vitals Time Taken: 1:13 PM, Height: 68 in, Temperature: 97.7 F, Pulse: 70 bpm, Respiratory Rate: 18 breaths/min, Blood Pressure: 162/71 mmHg. Eyes Nonicteric. Reactive to light. Ears, Nose, Mouth, and Throat Lips, teeth, and gums WNL.Marland Kitchen Moist mucosa without lesions . Neck supple and nontender. No palpable supraclavicular or cervical adenopathy. Normal sized without goiter. Respiratory WNL. No retractions.. Cardiovascular Pedal Pulses WNL. No clubbing, cyanosis or edema. Chest Breasts symmetical and no nipple discharge.. Breast tissue WNL, no masses, lumps, or tenderness.. Lymphatic No adneopathy. No adenopathy. No adenopathy. Musculoskeletal Adexa without tenderness or enlargement.. Digits and nails w/o clubbing, cyanosis, infection, petechiae, ischemia, or inflammatory conditions.Marland Kitchen Psychiatric Judgement and insight Intact.. No evidence of depression, anxiety, or agitation.Marland Kitchen [name], [name] ([id number]) General Notes: The wound is looking healthy and there is healthy granulation tissue with no cellulitis and minimal edema. Integumentary (Hair, Skin) No suspicious lesions. No crepitus or fluctuance. No peri-wound warmth or erythema. No masses.. Wound #5 status is Open. Original cause of wound was Gradually Appeared. The wound is located on the Right,Medial Lower Leg. The wound measures 10cm length x 5.5cm width x 0.2cm depth; 43.197cm^2 area and 8.639cm^3 volume. The wound is limited to skin breakdown. There is no tunneling or undermining noted. There is a large amount of serosanguineous drainage noted. The wound margin is distinct with the outline attached to the wound base. There is large (67-100%) pink, pale granulation within the wound bed. There is a small (1-33%) amount of necrotic tissue within the wound bed including Adherent Slough. The periwound skin appearance  exhibited: Localized Edema, Moist, Mottled. The periwound skin appearance did not exhibit: Callus, Crepitus, Excoriation, Fluctuance, Friable, Induration, Rash, Scarring, Dry/Scaly, Maceration, Atrophie Blanche, Cyanosis, Ecchymosis, Hemosiderin Staining, Pallor, Rubor, Erythema. Periwound temperature was noted as No Abnormality. 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 He will come back and see as next week Plan Wound Cleansing: Wound #5 Right,Medial Lower Leg: Cleanse wound with mild soap and water May shower with protection. No tub bath. FRENCH, KENDRA ([id number]) Skin Barriers/Peri-Wound Care: Wound #5 Right,Medial Lower Leg: Barrier cream Primary Wound Dressing: Wound #5 Right,Medial Lower Leg: Hydrafera Blue Secondary Dressing: Wound #5 Right,Medial Lower Leg: ABD pad XtraSorb Dressing Change Frequency: Wound #5 Right,Medial Lower Leg: Change dressing every week Other: - As needed for drainage Follow-up Appointments: Wound #5 Right,Medial Lower Leg: Return Appointment in 1 week. Edema Control: Wound #5 Right,Medial Lower Leg: Unna Boot to Right Lower Extremity Additional Orders / Instructions: Wound #5 Right,Medial Lower Leg: Other: - SNF nurse may change unna wrap as needed for copius drainage or odor. SNF nurse is to call [facility] prior to changing unna wrap for instruction r/t improper unna wrap Please call us at [phone] to be sure wrap is applied correctly prior to changing wrap. Speak with Selena Batten or Mardene Celeste Till we put him on the list to obtain samples and trial of  puraply we will continue with [facility] and an Unna's wrap. He will come back and see as next week Electronic Signature(s) Signed: 03/15/2015 4:31:03 PM By: Evlyn Kanner MD, FACS Previous Signature: 03/14/2015 1:16:47 PM Version By: Evlyn Kanner MD, FACS Entered By: Evlyn Kanner on 03/15/2015 16:31:03 LORENZ, DONLEY ([id number]) -------------------------------------------------------------------------------- SuperBill Details Patient Name: [name] Date of Service: 03/14/2015 Medical Record Number: [id number] Patient Account Number: [id number] Date of Birth/Sex: [birth year] (79 y.o. 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 E66.01 Morbid (severe) obesity due to excess calories I89.0 Lymphedema, not elsewhere classified Facility Procedures CPT4 Code: [id number] Description: (Facility Use Only) [phone] - APPLY Roland Rack BOOT RT Modifier: Quantity: 1 Physician Procedures CPT4 Code Description: [id number] - WC PHYS LEVEL 3 - EST PT ICD-10 Description Diagnosis I87.311 Chronic venous hypertension (idiopathic) with ulcer of I89.0 Lymphedema, not elsewhere classified Modifier: right lower Quantity: 1 extremity Electronic Signature(s) Signed: 03/14/2015 1:26:42 PM By: Elpidio Eric BSN, RN Signed: 03/14/2015 4:23:40 PM By: Evlyn Kanner MD, FACS Previous Signature: 03/14/2015 1:17:13 PM Version By: Evlyn Kanner MD, FACS Entered By: Elpidio Eric on 03/14/2015 13:26:42

## 2015-03-21 ENCOUNTER — Encounter: Payer: Medicare Other | Admitting: Surgery

## 2015-03-21 DIAGNOSIS — L97313 Non-pressure chronic ulcer of right ankle with necrosis of muscle: Secondary | ICD-10-CM | POA: Diagnosis not present

## 2015-03-21 NOTE — Progress Notes (Addendum)
[name], [name] ([id number]) Visit Report for 03/21/2015 Chief Complaint Document Details Patient Name: [name], [name] Date of Service: 03/21/2015 1:00 PM Medical Record Number: [id number] Patient Account Number: [id number] Date of Birth/Sex: 1936-02-22 (79 y.o. Male) Treating RN: Curtis Sites Primary Care Physician: Dorothey Baseman Other Clinician: Referring Physician: Dorothey Baseman Treating Physician/Extender: Rudene Re in Treatment: 9 Information Obtained from: Patient Chief Complaint Patient presents for treatment of an open ulcer due to venous insufficiency. The patient has had a open wound on his right medial ankle for at least 3 years and was last seen in the wound clinic in February 2015. He was lost to follow-up after that. Electronic Signature(s) Signed: 03/21/2015 1:59:46 PM By: Evlyn Kanner MD, FACS Entered By: Evlyn Kanner on 03/21/2015 13:59:45 [place], Hijazi Chrissie Noa ([id number]) -------------------------------------------------------------------------------- Cellular or Tissue Based Product Details Patient Name: [name], [name]. Date of Service: 03/21/2015 1:00 PM Medical Record Number: [id number] Patient Account Number: [id number] Date of Birth/Sex: 06-23-1936 (79 y.o. Male) Treating RN: Huel Coventry Primary Care Physician: Terance Hart, DAVID Other Clinician: Referring Physician: Dorothey Baseman Treating Physician/Extender: Rudene Re in Treatment: 9 Cellular or Tissue Based Wound #5 Right,Medial Lower Leg Product Type Applied to: Performed By: Physician Tristan Schroeder., MD Cellular or Tissue Based Apligraf Product Type: Time-Out Taken: Yes Location: trunk / arms / legs Wound Size (sq cm): 52.25 Product Size (sq cm): 44 Waste Size (sq cm): 0 Amount of Product Applied (sq cm): 44 Lot #: [id number] Order #: [id number] Expiration Date: 03/23/2015 Fenestrated: Yes Instrument: Blade Reconstituted: No Secured: Yes Secured With:  Steri-Strips Dressing Applied: Yes Primary Dressing: Mepitel Procedural Pain: 0 Post Procedural Pain: 0 Response to Treatment: Procedure was tolerated well Post Procedure Diagnosis Same as Pre-procedure Electronic Signature(s) Signed: 03/21/2015 6:10:20 PM By: Elliot Gurney, RN, BSN, Kim RN, BSN Previous Signature: 03/21/2015 2:50:45 PM Version By: Evlyn Kanner MD, FACS Entered By: Elliot Gurney RN, BSN, Kim on 03/21/2015 14:52:28 [place], [place] Chrissie Noa ([id number]) -------------------------------------------------------------------------------- HPI Details Patient Name: [name] Date of Service: 03/21/2015 1:00 PM Medical Record Number: [id number] Patient Account Number: [id number] Date of Birth/Sex: [birth year] (79 y.o. Male) Treating RN: Curtis Sites Primary Care Physician: Dorothey Baseman Other Clinician: Referring Physician: Terance Hart, DAVID Treating Physician/Extender: Rudene Re in Treatment: 9 History of Present Illness Location: right lower extremity ulceration Quality: Patient reports experiencing a dull pain to affected area(s). Severity: Patient states wound are getting worse. 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 Modifying Factors: Consults to this date include:has received treatment in the wound center in the past but stopped coming here since February 2015 Associated Signs and Symptoms: Patient reports having difficulty standing for long periods. HPI Description: 79 year old male who is known to have progressive weakness and has been in a nursing home for a while has had chronic lower extremity edema both legs and a large open wound on the right lower extremity which is has at  least for about 3-4 years. The patient was seen in the wound clinic before and has been treated until February 2015 when he was lost to follow-up. Past medical history is significant for hypertension, gout, venous stasis ulcers, Parkinson's [name], constipation. He's not been a diabetic  but his last hemoglobin A1c was 6 in March 2015. There is documentation that he's had endovenous ablation of bilateral varicose veins but we have no documentation about this and this may be done at least 4-5 years ago. 01/22/2015 -- he has his vascular test is scheduled for this afternoon. 01/29/2015 -- the patient's vascular test was canceled last week because he was unable to get onto the examining bed at [facility]. His Unna's boot was not applied either and the patient had a dressing placed by home health. Today when his dressing was removed we found a lot of maggots in his right lower extremity. 02/07/2015 -- Was seen in the vascular office by the PA and she noted that a bilateral venous duplex study did not show any DVT, SVT or reflux bilaterally. The patient was advised to continue with Unna's boot and would at some stage to require graduated compression stockings of the 20-30 mmHg variety. In the future lymphedema pumps would also be considered. 02/14/2015 -- his dressing is smelling quite a bit and there is a discoloration of the wound suggestive of an infection. Deep tissue cultures will be taken today. 02/21/2015 -- the culture is back and it has growth moderate growth of Proteus and gram-negative rods sensitive to sulfa, ampicillin, cephazolin, Zosyn. He is already on doxycycline and his wound is looking clean and hence we will continue with this. 02/28/2015 -- he's been doing fine still on his anti-buttocks and has no fresh issues. 03/07/2015 -- I was told that because he lives in a nursing home the Apligraf was denied by his insurance company. 03/14/2015 -- we are awaiting samples and a trial of  Puraply to be tried for his ulceration. 03/21/2015 -- his Apligraf was approved and he is here for his first application of Apligraf [name], [name] ([id number]) Electronic Signature(s) Signed: 03/21/2015 2:00:09 PM By: Evlyn Kanner MD, FACS Entered By: Evlyn Kanner on 03/21/2015 14:00:09 BIRCH, FARINO ([id number]) -------------------------------------------------------------------------------- Physical Exam Details Patient Name: JETER, TOMEY. Date of Service: 03/21/2015 1:00 PM Medical Record Number: [id number] Patient Account Number: [id number] Date of Birth/Sex: Apr 20, 1936 (79 y.o. Male) Treating RN: Curtis Sites Primary Care Physician: Dorothey Baseman Other Clinician: Referring Physician: Terance Hart, DAVID Treating Physician/Extender: Rudene Re in Treatment: 9 Constitutional . Pulse regular. Respirations normal and unlabored. Afebrile. . Eyes Nonicteric. Reactive to light. Ears, Nose, Mouth, and Throat Lips, teeth, and gums WNL.Marland Kitchen Moist mucosa without lesions . Neck supple and nontender. No palpable supraclavicular or cervical adenopathy. Normal sized without goiter. Respiratory WNL. No retractions.. Cardiovascular Pedal Pulses WNL. No clubbing, cyanosis or edema. Lymphatic No adneopathy. No adenopathy. No adenopathy. Musculoskeletal Adexa without tenderness or enlargement.. Digits and nails w/o clubbing, cyanosis, infection, petechiae, ischemia, or inflammatory conditions.. Integumentary (Hair, Skin) No suspicious lesions. No crepitus or fluctuance. No peri-wound warmth or erythema. No masses.Marland Kitchen Psychiatric Judgement and insight Intact.. No evidence of depression, anxiety, or agitation.. Notes after washing his wound well with saline he is ready for this application of Apligraf. Electronic Signature(s) Signed: 03/21/2015 2:00:35 PM By: Evlyn Kanner MD, FACS Entered By: Evlyn Kanner on 03/21/2015 14:00:35 DIANE, MOCHIZUKI  ([id number]) -------------------------------------------------------------------------------- Physician Orders Details Patient Name: REVIN, CORKER. Date of Service: 03/21/2015 1:00 PM Medical Record Number: [id number] Patient Account Number: [id number] Date of Birth/Sex: September 14, 1935 (79 y.o. 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 o No tub bath. Skin Barriers/Peri-Wound Care Wound #5 Right,Medial Lower Leg o Barrier cream Primary Wound Dressing Wound #5 Right,Medial Lower Leg o Drawtex o Mepitel One Secondary Dressing Wound #5 Right,Medial Lower Leg o ABD pad o XtraSorb Dressing Change Frequency Wound #5 Right,Medial Lower Leg o Change dressing every week o Other: - As needed for drainage Follow-up Appointments Wound #5 Right,Medial Lower Leg o Return Appointment in 1 week. ROMARI, GASPARRO ([id number]) Edema Control Wound #5 Right,Medial Lower Leg o Unna Boot to Right Lower Extremity Additional Orders / Instructions Wound #5 Right,Medial Lower Leg o Other: - SNF nurse may change unna wrap as needed for copius drainage or odor. DO NOT REMOVE DRESSING BELOW DRAWTEX. PATIENT HAS SKIN SUBSTITUTE AND IT SHOULD NOT BE REMOVED SNF nurse is to call [facility] prior to changing unna wrap  for instruction r/t improper unna wrap Please call us at [phone] to be sure wrap is applied correctly prior to changing wrap. Speak with Selena Batten or Mardene Celeste [facility] Wound #5 Right,Medial Lower Leg o Apligraf application in clinic; including contact layer, fixation with steri strips, dry gauze and cover dressing. Electronic Signature(s) Signed: 03/21/2015 4:21:04 PM By: Evlyn Kanner MD, FACS Signed: 03/21/2015 6:07:15 PM By: Curtis Sites Entered By: Curtis Sites on 03/21/2015 14:54:41 Tyreese, Thain Chrissie Noa ([id number]) -------------------------------------------------------------------------------- Problem List Details Patient Name: WOFFORD, STRATTON. Date of Service: 03/21/2015 1:00 PM Medical Record Number: [id number] Patient Account Number: [id number] Date of Birth/Sex: 1936-05-17 (80 y.o. 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 Date of Service: 03/21/2015 1:00 PM Medical Record Number: [id number] Patient Account Number: [id number] Date of Birth/Sex: 01-Jun-1936 (79 y.o. Male) Treating RN: Curtis Sites Primary Care Physician: Dorothey Baseman Other Clinician: Referring Physician: Dorothey Baseman Treating Physician/Extender: Rudene Re in Treatment: 9 Subjective Chief Complaint Information obtained from Patient Patient presents for treatment of an open ulcer due to venous insufficiency. The patient has had a open wound on his right medial ankle for at least 3 years and was last seen in the wound clinic in February 2015. He was lost to follow-up after that. History of Present Illness (HPI) The following HPI elements were documented for the patient's wound: Location: right lower extremity ulceration Quality: Patient reports experiencing a dull pain to affected area(s). Severity: Patient states wound are getting worse. 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 Modifying Factors: Consults to this date include:has received treatment in the wound center in the past but stopped coming here since February 2015 Associated Signs and Symptoms: Patient reports having difficulty standing for long periods. 79 year old male who is known to have progressive weakness and has been in a nursing home for a while has had chronic lower extremity edema both legs and a large open wound on the right lower extremity which is has at least for about 3-4 years. The patient was seen in the wound clinic before and has been treated until February 2015 when he was lost to follow-up. Past medical history is significant for hypertension, gout, venous stasis ulcers, Parkinson's [name], constipation. He's not been a diabetic but his last hemoglobin A1c was 6 in March 2015. There is documentation that he's had  endovenous ablation of bilateral varicose veins but we have no documentation about this and this may be done at least 4-5 years ago. 01/22/2015 -- he has his vascular test is scheduled for this afternoon. 01/29/2015 -- the patient's vascular test was canceled last week because he was unable to get onto the examining bed at [facility]. His Unna's boot was not applied either and the patient had a dressing placed by home health. Today when his dressing was removed we found a lot of maggots in his right lower extremity. 02/07/2015 -- Was seen in the vascular office by the PA and she noted that a bilateral venous duplex study did not show any DVT, SVT or reflux bilaterally. The patient was advised to continue with Unna's boot and would at some stage to require graduated compression stockings of the 20-30 mmHg variety. In the future lymphedema pumps would also be considered. 02/14/2015 -- his dressing is smelling quite a bit and there is a discoloration of the wound suggestive of an FERREL, SIMINGTON. ([id number]) infection. Deep tissue cultures will be taken today. 02/21/2015 -- the culture is back and it has growth moderate growth of Proteus and gram-negative rods sensitive to sulfa, ampicillin, cephazolin, Zosyn. He is already on doxycycline and his wound is looking clean and hence we will continue with this. 02/28/2015 -- he's been doing fine still on his anti-buttocks and has no fresh issues. 03/07/2015 -- I was told that because he lives in a nursing home the Apligraf was denied by his insurance company. 03/14/2015 -- we are awaiting samples and a trial of Puraply to be tried for his ulceration. 03/21/2015 -- his Apligraf was approved and he is here for his first application of Apligraf Objective Constitutional Pulse regular. Respirations normal and unlabored. Afebrile. Vitals Time Taken: 1:17 PM, Height: 68 in, Temperature: 97.4 F, Pulse: 67 bpm, Respiratory Rate: 18 breaths/min, Blood Pressure:  141/62 mmHg. Eyes Nonicteric. Reactive to light. Ears, Nose, Mouth, and Throat Lips, teeth, and gums WNL.Marland Kitchen Moist mucosa without lesions . Neck supple and nontender. No palpable supraclavicular or cervical adenopathy. Normal sized without goiter. Respiratory WNL. No retractions.. Cardiovascular Pedal Pulses WNL. No clubbing, cyanosis or edema. Lymphatic No adneopathy. No adenopathy. No adenopathy. Musculoskeletal Adexa without tenderness or enlargement.. Digits and nails w/o clubbing, cyanosis, infection, petechiae, ischemia, or inflammatory conditions.Marland Kitchen Psychiatric Judgement and insight Intact.. No evidence of depression, anxiety, or agitation.Marland Kitchen RODEL, GLASPY ([id number]) General Notes: after washing his wound well with saline he is ready for this  application of Apligraf. Integumentary (Hair, Skin) No suspicious lesions. No crepitus or fluctuance. No peri-wound warmth or erythema. No masses.. Wound #5 status is Open. Original cause of wound was Gradually Appeared. The wound is located on the Right,Medial Lower Leg. The wound measures 9.5cm length x 5.5cm width x 0.2cm depth; 41.037cm^2 area and 8.207cm^3 volume. The wound is limited to skin breakdown. There is no tunneling or undermining noted. There is a large amount of serosanguineous drainage noted. The wound margin is distinct with the outline attached to the wound base. There is large (67-100%) pink, pale granulation within the wound bed. There is no necrotic tissue within the wound bed. The periwound skin appearance exhibited: Localized Edema, Moist, Mottled. The periwound skin appearance did not exhibit: Callus, Crepitus, Excoriation, Fluctuance, Friable, Induration, Rash, Scarring, Dry/Scaly, Maceration, Atrophie Blanche, Cyanosis, Ecchymosis, Hemosiderin Staining, Pallor, Rubor, Erythema. Periwound temperature was noted as No Abnormality. 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 He will have his own Unna's boot applied. He will come back next week for a wound check. Procedures Wound #5 Wound #5 is a Venous Leg Ulcer located on the Right,Medial Lower Leg. A skin graft procedure using a bioengineered skin substitute/cellular or tissue based product was performed by Phoebie Shad, Ignacia Felling., MD. Apligraf was applied and secured with Steri-Strips. 44 sq cm of product was utilized and 0 sq cm was wasted. Post Application, Mepitel was applied. A Time Out was conducted prior to the start of the procedure. The procedure was tolerated well with a pain level of 0 throughout and a pain level of 0 following the procedure. Post procedure Diagnosis Wound #5: Same as Pre-Procedure NAJI, MEHRINGER. ([id number]) . Plan Wound Cleansing: Wound #5 Right,Medial Lower Leg: Cleanse wound with mild soap and water May shower with protection. No tub bath. Skin Barriers/Peri-Wound Care: Wound #5 Right,Medial Lower Leg: Barrier cream Primary Wound Dressing: Wound #5 Right,Medial Lower Leg: Drawtex Mepitel One Secondary Dressing: Wound #5 Right,Medial Lower Leg: ABD pad XtraSorb Dressing Change Frequency: Wound #5 Right,Medial Lower Leg: Change dressing every week Other: - As needed for drainage Follow-up Appointments: Wound #5 Right,Medial Lower Leg: Return Appointment in 1 week. Edema Control: Wound #5 Right,Medial Lower Leg: Unna Boot to Right Lower Extremity Additional Orders / Instructions: Wound #5 Right,Medial Lower Leg: Other: - SNF nurse may change unna wrap as needed for copius drainage or odor. DO NOT REMOVE DRESSING BELOW DRAWTEX. PATIENT  HAS SKIN SUBSTITUTE AND IT SHOULD NOT BE REMOVED SNF nurse is to call [facility] prior to changing unna wrap for instruction r/t improper unna wrap Please call us at [phone] to be sure wrap is applied correctly prior to changing wrap. Speak with Selena Batten or Mardene Celeste [facility]: Wound #5 Right,Medial Lower Leg: Apligraf application in clinic; including contact layer, fixation with steri strips, dry gauze and cover dressing. DOLLIE, MAYSE ([id number]) Using sterile precautions and the usual methodology Apligraf was applied bolster and fixed with Steri-Strips. He will have his own Unna's boot applied. He will come back next week for a wound check. Electronic Signature(s) Signed: 03/21/2015 4:23:43 PM By: Evlyn Kanner MD, FACS Previous Signature: 03/21/2015 2:51:05 PM Version By: Evlyn Kanner MD,  FACS Previous Signature: 03/21/2015 2:01:24 PM Version By: Evlyn Kanner MD, FACS Entered By: Evlyn Kanner on 03/21/2015 16:23:43 TAKEO, HARTS ([id number]) -------------------------------------------------------------------------------- SuperBill Details Patient Name: EMMERICH, CRYER. Date of Service: 03/21/2015 Medical Record Number: [id number] Patient Account Number: [id number] Date of Birth/Sex: Nov 08, 1935 (79 y.o. 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 E66.01 Morbid (severe) obesity due to excess calories I89.0 Lymphedema, not elsewhere classified Facility Procedures CPT4 Code Description: [id number] (Facility Use Only) Apligraf 1 SQ CM Modifier: Quantity: 44 CPT4 Code Description: 30865784 15271 - SKIN  SUB GRAFT TRNK/ARM/LEG ICD-10 Description Diagnosis I87.311 Chronic venous hypertension (idiopathic) with ulcer of E66.01 Morbid (severe) obesity due to excess calories L97.313 Non-pressure chronic ulcer of  right ankle with necrosi I89.0 Lymphedema, not elsewhere classified Modifier: right lower s of muscle Quantity: 1 extremity CPT4 Code Description: [id number] 15272 - SKIN SUB GRAFT T/A/L ADD-ON ICD-10 Description Diagnosis I87.311 Chronic venous hypertension (idiopathic) with ulcer of 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 E66.01 Morbid (severe) obesity  due to excess calories I89.0 Lymphedema, not elsewhere classified Modifier: right lower s of muscle Quantity: 1 extremity Physician Procedures CPT4 Code Description: [id number] 15271 - WC PHYS SKIN SUB GRAFT TRNK/ARM/LEG ICD-10 Description Diagnosis I87.311 Chronic venous hypertension (idiopathic) with ulcer of E66.01 Morbid (severe) obesity due to excess calories FITZPATRICK, ALBERICO ([id number]) Modifier: right lower Quantity: 1 extremity Electronic Signature(s) Signed: 03/21/2015 2:51:45 PM By: Evlyn Kanner MD, FACS Entered By: Evlyn Kanner on 03/21/2015 14:51:44

## 2015-03-22 NOTE — Progress Notes (Signed)
TORRIAN, CANION ([id number]) Visit Report for 03/21/2015 Arrival Information Details Patient Name: [name], [name]. Date of Service: 03/21/2015 1:00 PM Medical Record Number: [id number] Patient Account Number: [id number] Date of Birth/Sex: Jul 14, 1935 (79 y.o. Male) Treating RN: Curtis Sites Primary Care Physician: Dorothey Baseman Other Clinician: Referring Physician: Terance Hart, DAVID Treating Physician/Extender: Rudene Re in Treatment: 9 Visit Information History Since Last Visit Added or deleted any medications: No Patient Arrived: Wheel Chair Any new allergies or adverse reactions: No Arrival Time: 13:17 Had a fall or experienced change in No activities of daily living that may affect Accompanied By: self risk of falls: Transfer Assistance: None Signs or symptoms of abuse/neglect since last No Patient Identification Verified: Yes visito Secondary Verification Process Yes Hospitalized since last visit: No Completed: Pain Present Now: No Patient Requires Transmission-Based No Precautions: Patient Has Alerts: No Electronic Signature(s) Signed: 03/21/2015 6:07:15 PM By: Curtis Sites Entered By: Curtis Sites on 03/21/2015 13:17:27 [name] ([id number]) -------------------------------------------------------------------------------- Encounter Discharge Information Details Patient Name: [name] Date of Service: 03/21/2015 1:00 PM Medical Record Number: [id number] Patient Account Number: [id number] Date of Birth/Sex: 1936/05/07 (79 y.o. Male) Treating RN: Curtis Sites Primary Care Physician: Dorothey Baseman Other Clinician: Referring Physician: Dorothey Baseman Treating Physician/Extender: Rudene Re in Treatment: 9 Encounter Discharge Information Items Discharge Pain Level: 0 Discharge Condition: Stable Ambulatory Status: Wheelchair Discharge Destination: Nursing Home Transportation: Private Auto Accompanied By:  self Schedule Follow-up Appointment: Yes Medication Reconciliation completed and provided to Patient/Care No Gabi Mcfate: Provided on Clinical Summary of Care: 03/21/2015 Form Type Recipient Paper Patient [facility] Electronic Signature(s) Signed: 03/21/2015 6:07:15 PM By: Curtis Sites Previous Signature: 03/21/2015 2:03:11 PM Version By: Gwenlyn Perking Entered By: Curtis Sites on 03/21/2015 14:56:15 [name], [place] Chrissie [name] ([id number]) -------------------------------------------------------------------------------- Lower Extremity Assessment Details Patient Name: [name] Date of Service: 03/21/2015 1:00 PM Medical Record Number: [id number] Patient Account Number: [id number] Date of Birth/Sex: 22-Oct-1935 (79 y.o. Male) Treating RN: Curtis Sites Primary Care Physician: Terance Hart, DAVID Other Clinician: Referring Physician: Terance Hart, DAVID Treating Physician/Extender: Rudene Re in Treatment: 9 Edema Assessment Assessed: [Left: No] [name]: No] Edema: [Left: Ye] [Right: s] Calf Left: Right: Point of Measurement: 40 cm From Medial Instep cm 38.4 cm Ankle Left: Right: Point of Measurement: 10 cm From Medial Instep cm 29 cm Vascular Assessment Pulses: Posterior Tibial Dorsalis Pedis Palpable: [Right:Yes] Extremity colors, hair growth, and conditions: Extremity Color: [Right:Hyperpigmented] Hair Growth on Extremity: [Right:No] Temperature of Extremity: [Right:Warm] Capillary Refill: [Right:< 3 seconds] Toe Nail Assessment Left: Right: Thick: Yes Discolored: Yes Deformed: Yes Improper Length and Hygiene: No Electronic Signature(s) Signed: 03/21/2015 6:07:15 PM By: Curtis Sites Entered By: Curtis Sites on 03/21/2015 13:21:12 [name] ([id number]) -------------------------------------------------------------------------------- Multi Wound Chart Details Patient Name: [name] Date of Service: 03/21/2015 1:00 PM Medical Record Number:  [id number] Patient Account Number: [id number] Date of Birth/Sex: [birth year] (79 y.o. Male) Treating RN: Curtis Sites Primary Care Physician: Dorothey Baseman Other Clinician: Referring Physician: Terance Hart, DAVID Treating Physician/Extender: Rudene Re in Treatment: 9 Vital Signs Height(in): 68 Pulse(bpm): 67 Weight(lbs): Blood Pressure 141/62 (mmHg): Body Mass Index(BMI): Temperature(F): 97.4 Respiratory Rate 18 (breaths/min): Photos: [5:No Photos] [N/A:N/A] Wound Location: [5:Right Lower Leg - Medial] [N/A:N/A] Wounding Event: [5:Gradually Appeared] [N/A:N/A] Primary Etiology: [5:Venous Leg Ulcer] [N/A:N/A] Comorbid History: [5:Cataracts, Anemia, Hypertension, Osteoarthritis] [N/A:N/A] Date Acquired: [5:11/13/2014] [N/A:N/A] Weeks of Treatment: [5:9] [N/A:N/A] Wound Status: [5:Open] [N/A:N/A] Measurements L x W x D 9.5x5.5x0.2 [N/A:N/A] (cm) Area (cm) : [5:41.037] [N/A:N/A] Volume (cm) : [5:8.207] [N/A:N/A] %  Reduction in Area: [5:60.00%] [N/A:N/A] % Reduction in Volume: 73.30% [N/A:N/A] Classification: [5:Full Thickness Without Exposed Support Structures] [N/A:N/A] Exudate Amount: [5:Large] [N/A:N/A] Exudate Type: [5:Serosanguineous] [N/A:N/A] Exudate Color: [5:red, brown] [N/A:N/A] Foul Odor After [5:Yes] [N/A:N/A] Cleansing: Odor Anticipated Due to No [N/A:N/A] Product Use: Wound Margin: [5:Distinct, outline attached] [N/A:N/A] Granulation Amount: [5:Large (67-100%)] [N/A:N/A] Granulation Quality: [5:Pink, Pale] [N/A:N/A] Necrotic Amount: [5:None Present (0%)] [N/A:N/A] Exposed Structures: Fascia: No N/A N/A Fat: No Tendon: No Muscle: No Joint: No Bone: No Limited to Skin Breakdown Epithelialization: Small (1-33%) N/A N/A Periwound Skin Texture: Edema: Yes N/A N/A Excoriation: No Induration: No Callus: No Crepitus: No Fluctuance: No Friable: No Rash: No Scarring: No Periwound Skin Moist: Yes N/A N/A Moisture: Maceration:  No Dry/Scaly: No Periwound Skin Color: Mottled: Yes N/A N/A Atrophie Blanche: No Cyanosis: No Ecchymosis: No Erythema: No Hemosiderin Staining: No Pallor: No Rubor: No Temperature: No Abnormality N/A N/A Tenderness on No N/A N/A Palpation: Wound Preparation: Ulcer Cleansing: Other: N/A N/A soap and water Topical Anesthetic Applied: None Treatment Notes Electronic Signature(s) Signed: 03/21/2015 6:07:15 PM By: Curtis Sites Entered By: Curtis Sites on 03/21/2015 13:26:44 [name] ([id number]) -------------------------------------------------------------------------------- Multi-Disciplinary Care Plan Details Patient Name: [name], [name]. Date of Service: 03/21/2015 1:00 PM Medical Record Number: [id number] Patient Account Number: [id number] Date of Birth/Sex: 05-Oct-1935 (79 y.o. Male) Treating RN: Curtis Sites Primary Care Physician: Dorothey Baseman Other Clinician: Referring Physician: Dorothey Baseman Treating Physician/Extender: Rudene Re in Treatment: 9 Active Inactive Orientation to the Wound Care Program Nursing Diagnoses: Knowledge deficit related to the wound healing center program Goals: Patient/caregiver will verbalize understanding of the Wound Healing Center Program Date Initiated: 01/15/2015 Goal Status: Active Interventions: Provide education on orientation to the wound center Notes: Venous Leg Ulcer Nursing Diagnoses: Knowledge deficit related to disease process and management Potential for venous Insuffiency (use before diagnosis confirmed) Goals: Patient will maintain optimal edema control Date Initiated: 01/15/2015 Goal Status: Active Patient/caregiver will verbalize understanding of disease process and disease management Date Initiated: 01/15/2015 Goal Status: Active Verify adequate tissue perfusion prior to therapeutic compression application Date Initiated: 01/15/2015 Goal Status: Active Interventions: Assess  peripheral edema status every visit. Compression as ordered Provide education on venous insufficiency [name], [name] ([id number]) Treatment Activities: Test ordered outside of clinic : 03/21/2015 Therapeutic compression applied : 03/21/2015 Venous Duplex Doppler : 03/21/2015 Notes: Wound/Skin Impairment Nursing Diagnoses: Impaired tissue integrity Knowledge deficit related to ulceration/compromised skin integrity Goals: Patient/caregiver will verbalize understanding of skin care regimen Date Initiated: 01/15/2015 Goal Status: Active Ulcer/skin breakdown will have a volume reduction of 30% by week 4 Date Initiated: 01/15/2015 Goal Status: Active Ulcer/skin breakdown will have a volume reduction of 50% by week 8 Date Initiated: 01/15/2015 Goal Status: Active Ulcer/skin breakdown will have a volume reduction of 80% by week 12 Date Initiated: 01/15/2015 Goal Status: Active Ulcer/skin breakdown will heal within 14 weeks Date Initiated: 01/15/2015 Goal Status: Active Interventions: Assess patient/caregiver ability to perform ulcer/skin care regimen upon admission and as needed Assess ulceration(s) every visit Provide education on ulcer and skin care Notes: Electronic Signature(s) Signed: 03/21/2015 6:07:15 PM By: Curtis Sites Entered By: Curtis Sites on 03/21/2015 13:26:35 [name] ([id number]) -------------------------------------------------------------------------------- Patient/Caregiver Education Details Patient Name: [name] Date of Service: 03/21/2015 1:00 PM Medical Record Number: [id number] Patient Account Number: [id number] Date of Birth/Gender: 10-Sep-1935 (79 y.o. Male) Treating RN: Curtis Sites Primary Care Physician: Dorothey Baseman Other Clinician: Referring Physician: Dorothey Baseman Treating Physician/Extender: Rudene Re in Treatment: 9 Education Assessment  Education Provided To: Patient Education Topics Provided Wound/Skin  Impairment: Handouts: Other: skin sub Methods: Explain/Verbal Responses: State content correctly Electronic Signature(s) Signed: 03/21/2015 6:07:15 PM By: Curtis Sites Entered By: Curtis Sites on 03/21/2015 14:56:31 [name], [place] Chrissie [name] ([id number]) -------------------------------------------------------------------------------- Wound Assessment Details Patient Name: [name], [name]. Date of Service: 03/21/2015 1:00 PM Medical Record Number: [id number] Patient Account Number: [id number] Date of Birth/Sex: September 26, 1935 (79 y.o. Male) Treating RN: Curtis Sites Primary Care Physician: Terance Hart, DAVID Other Clinician: Referring Physician: Terance Hart, DAVID Treating Physician/Extender: Rudene Re in Treatment: 9 Wound Status Wound Number: 5 Primary Venous Leg Ulcer Etiology: Wound Location: Right Lower Leg - Medial Wound Status: Open Wounding Event: Gradually Appeared Comorbid Cataracts, Anemia, Hypertension, Date Acquired: 11/13/2014 History: Osteoarthritis Weeks Of Treatment: 9 Clustered Wound: No Photos Photo Uploaded By: Curtis Sites on 03/21/2015 16:23:26 Wound Measurements Length: (cm) 9.5 Width: (cm) 5.5 Depth: (cm) 0.2 Area: (cm) 41.037 Volume: (cm) 8.207 % Reduction in Area: 60% % Reduction in Volume: 73.3% Epithelialization: Small (1-33%) Tunneling: No Undermining: No Wound Description Full Thickness Without Exposed Classification: Support Structures Wound Margin: Distinct, outline attached Exudate Large Amount: Exudate Type: Serosanguineous Exudate Color: red, brown Foul Odor After Cleansing: Yes Due to Product Use: No Wound Bed Granulation Amount: Large (67-100%) Exposed Structure Granulation Quality: Pink, Pale Fascia Exposed: No Necrotic Amount: None Present (0%) Fat Layer Exposed: No [name], [name] ([id number]) Tendon Exposed: No Muscle Exposed: No Joint Exposed: No Bone Exposed: No Limited to Skin Breakdown Periwound Skin  Texture Texture Color No Abnormalities Noted: No No Abnormalities Noted: No Callus: No Atrophie Blanche: No Crepitus: No Cyanosis: No Excoriation: No Ecchymosis: No Fluctuance: No Erythema: No Friable: No Hemosiderin Staining: No Induration: No Mottled: Yes Localized Edema: Yes Pallor: No Rash: No Rubor: No Scarring: No Temperature / Pain Moisture Temperature: No Abnormality No Abnormalities Noted: No Dry / Scaly: No Maceration: No Moist: Yes Wound Preparation Ulcer Cleansing: Other: soap and water, Topical Anesthetic Applied: None Treatment Notes Wound #5 (Right, Medial Lower Leg) 1. Cleansed with: Cleanse wound with antibacterial soap and water 3. Peri-wound Care: Skin Prep 4. Dressing Applied: Mepitel Other dressing (specify in notes) 5. Secondary Dressing Applied ABD Pad 7. Secured with Tape Unna Boot to Right Lower Extremity Notes xtrasorb, drawtex, apligraf applied by Dr Meyer Russel in clinic today Electronic Signature(s) [name], [name] ([id number]) Signed: 03/21/2015 6:07:15 PM By: Curtis Sites Entered By: Curtis Sites on 03/21/2015 13:26:22 [name], [name] ([id number]) -------------------------------------------------------------------------------- Vitals Details Patient Name: [name], [name]. Date of Service: 03/21/2015 1:00 PM Medical Record Number: [id number] Patient Account Number: [id number] Date of Birth/Sex: 25-Sep-1935 (79 y.o. Male) Treating RN: Curtis Sites Primary Care Physician: Terance Hart, DAVID Other Clinician: Referring Physician: Terance Hart, DAVID Treating Physician/Extender: Rudene Re in Treatment: 9 Vital Signs Time Taken: 13:17 Temperature (F): 97.4 Height (in): 68 Pulse (bpm): 67 Respiratory Rate (breaths/min): 18 Blood Pressure (mmHg): 141/62 Reference Range: 80 - 120 mg / dl Electronic Signature(s) Signed: 03/21/2015 6:07:15 PM By: Curtis Sites Entered By: Curtis Sites on 03/21/2015 13:17:51

## 2015-03-28 ENCOUNTER — Encounter: Payer: Medicare Other | Admitting: Surgery

## 2015-03-28 DIAGNOSIS — L97313 Non-pressure chronic ulcer of right ankle with necrosis of muscle: Secondary | ICD-10-CM | POA: Diagnosis not present

## 2015-03-29 NOTE — Progress Notes (Signed)
NOLLAN, MULDROW ([id number]) Visit Report for 03/28/2015 Chief Complaint Document Details Patient Name: [name], [name] Date of Service: 03/28/2015 1:45 PM Medical Record Number: [id number] Patient Account Number: [id number] Date of Birth/Sex: 16-Dec-1935 (79 y.o. Male) Treating RN: Clover Mealy, RN, BSN, [place] Sink Primary Care Physician: Terance Hart, DAVID Other Clinician: Referring Physician: Dorothey Baseman Treating Physician/Extender: Rudene Re in Treatment: 10 Information Obtained from: Patient Chief Complaint Patient presents for treatment of an open ulcer due to venous insufficiency. The patient has had a open wound on his right medial ankle for at least 3 years and was last seen in the wound clinic in February 2015. He was lost to follow-up after that. Electronic Signature(s) Signed: 03/28/2015 2:38:27 PM By: Evlyn Kanner MD, FACS Entered By: Evlyn Kanner on 03/28/2015 14:38:27 [name], [name] ([id number]) -------------------------------------------------------------------------------- Cellular or Tissue Based Product Details Patient Name: [name], [name] Date of Service: 03/28/2015 1:45 PM Medical Record Number: [id number] Patient Account Number: [id number] Date of Birth/Sex: June 27, 1936 (79 y.o. Male) Treating RN: Clover Mealy, RN, BSN, [place] Sink Primary Care Physician: Terance Hart, DAVID Other Clinician: Referring Physician: Terance Hart, DAVID Treating Physician/Extender: Rudene Re in Treatment: 10 Cellular or Tissue Based Wound #5 Right,Medial Lower Leg Product Type Applied to: Performed By: Physician Tristan Schroeder., MD Cellular or Tissue Based Apligraf Product Type: Time-Out Taken: Yes Location: trunk / arms / legs Wound Size (sq cm): 52.25 Product Size (sq cm): 44 Waste Size (sq cm): 0 Amount of Product Applied (sq cm): 44 Lot #: [id number] Expiration Date: 04/03/2015 Fenestrated: Yes Instrument: Blade Reconstituted: Yes Solution Type: saline Solution  Amount: 2ml Lot #: [id number] Solution Expiration 11/03/2016 Date: Secured: Yes Secured With: Steri-Strips Dressing Applied: Yes Primary Dressing: mepitel Procedural Pain: 0 Post Procedural Pain: 0 Response to Treatment: Procedure was tolerated well Post Procedure Diagnosis Same as Pre-procedure Electronic Signature(s) Signed: 03/28/2015 2:38:22 PM By: Evlyn Kanner MD, FACS Previous Signature: 03/28/2015 2:13:19 PM Version By: Elpidio Eric BSN, RN Entered By: Evlyn Kanner on 03/28/2015 14:38:22 [name], [name] ([id number]) [name], [name] ([id number]) -------------------------------------------------------------------------------- HPI Details Patient Name: [name], [name] Date of Service: 03/28/2015 1:45 PM Medical Record Number: [id number] Patient Account Number: [id number] Date of Birth/Sex: 06/13/1936 (79 y.o. Male) Treating RN: Clover Mealy, RN, BSN, [place] Sink Primary Care Physician: Terance Hart, DAVID Other Clinician: Referring Physician: Terance Hart, DAVID Treating Physician/Extender: Rudene Re in Treatment: 10 History of Present Illness Location: right lower extremity ulceration Quality: Patient reports experiencing a dull pain to affected area(s). Severity: Patient states wound are getting worse. 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 Modifying Factors: Consults to this date include:has received treatment in the wound center in the past but stopped coming here since February 2015 Associated Signs and Symptoms: Patient reports having difficulty standing for long periods. HPI Description: 79 year old male who is known to have progressive weakness and has been in a nursing home for a while  has had chronic lower extremity edema both legs and a large open wound on the right lower extremity which is has at least for about 3-4 years. The patient was seen in the wound clinic before and has been treated until February 2015 when he was lost to follow-up. Past medical history is  significant for hypertension, gout, venous stasis ulcers, Parkinson's [name], constipation. He's not been a diabetic but his last hemoglobin A1c was 6 in March 2015. There is documentation that he's had endovenous ablation of bilateral varicose veins but we have no documentation about this and this may be done at least 4-5 years ago. 01/22/2015 -- he has his vascular test is scheduled for this afternoon. 01/29/2015 -- the patient's vascular test was canceled last week because he was unable to get onto the examining bed at [facility]. His Unna's boot was not applied either and the patient had a dressing placed by home health. Today when his dressing was removed we found a lot of maggots in his right lower extremity. 02/07/2015 -- Was seen in the vascular office by the PA and she noted that a bilateral venous duplex study did not show any DVT, SVT or reflux bilaterally. The patient was advised to continue with Unna's boot and would at some stage to require graduated compression stockings of the 20-30 mmHg variety. In the future lymphedema pumps would also be considered. 02/14/2015 -- his dressing is smelling quite a bit and there is a discoloration of the wound suggestive of an infection. Deep tissue cultures will be taken today. 02/21/2015 -- the culture is back and it has growth moderate growth of Proteus and gram-negative rods sensitive to sulfa, ampicillin, cephazolin, Zosyn. He is already on doxycycline and his wound is looking clean and hence we will continue with this. 02/28/2015 -- he's been doing fine still on his anti-buttocks and has no fresh issues. 03/07/2015 -- I was told that because he lives in a  nursing home the Apligraf was denied by his insurance company. 03/14/2015 -- we are awaiting samples and a trial of Puraply to be tried for his ulceration. 03/21/2015 -- his Apligraf was approved and he is here for his first application of Apligraf [name], [name] ([id number]) 03/28/2015 -- his wound has had quite a bit of secretion and needs to be changed and hence I will use the second application of Apligraf. Electronic Signature(s) Signed: 03/28/2015 2:38:52 PM By: Evlyn Kanner MD, FACS Entered By: Evlyn Kanner on 03/28/2015 14:38:51 LOTT, SEELBACH ([id number]) -------------------------------------------------------------------------------- Physical Exam Details Patient Name: [name], [name] Date of Service: 03/28/2015 1:45 PM Medical Record Number: [id number] Patient Account Number: [id number] Date of Birth/Sex: Nov 29, 1935 (79 y.o. Male) Treating RN: Clover Mealy, RN, BSN, [place] Sink Primary Care Physician: Terance Hart, DAVID Other Clinician: Referring Physician: Terance Hart, DAVID Treating Physician/Extender: Rudene Re in Treatment: 10 Constitutional . Pulse regular. Respirations normal and unlabored. Afebrile. . Eyes Nonicteric. Reactive to light. Ears, Nose, Mouth, and Throat Lips, teeth, and gums WNL.Marland Kitchen Moist mucosa without lesions . Neck supple and nontender. No palpable supraclavicular or cervical adenopathy. Normal sized without goiter. Respiratory WNL. No retractions.. Cardiovascular Pedal Pulses WNL. No clubbing, cyanosis or edema. Lymphatic No adneopathy. No adenopathy. No adenopathy. Musculoskeletal Adexa without tenderness or enlargement.. Digits and nails w/o clubbing, cyanosis, infection, petechiae, ischemia, or inflammatory conditions.. Integumentary (Hair, Skin) No suspicious lesions. No crepitus or fluctuance. No peri-wound warmth or erythema. No masses.Marland Kitchen Psychiatric Judgement and insight Intact.. No evidence of depression, anxiety, or  agitation.. Notes The wound was a bit soupy and needed cleaning with saline so I washed it out nicely debrided some of the slough and will apply the second layer Apligraf today. Electronic Signature(s) Signed: 03/28/2015 2:39:25 PM By: Evlyn Kanner MD, FACS Entered By: Evlyn Kanner on 03/28/2015 14:39:24 JOVANNIE, ULIBARRI ([id number]) -------------------------------------------------------------------------------- Physician Orders Details  Patient Name: [name], [name] Date of Service: 03/28/2015 1:45 PM Medical Record Number: [id number] Patient Account Number: [id number] Date of Birth/Sex: [birth year] (79 y.o. Male) Treating RN: Clover Mealy, RN, BSN, [place] Sink Primary Care Physician: Terance Hart, DAVID Other Clinician: Referring Physician: Terance Hart DAVID Treating Physician/Extender: Rudene Re in Treatment: 10 Verbal / Phone Orders: Yes Clinician: Afful, RN, BSN, Rita Read Back and Verified: Yes Diagnosis Coding Wound Cleansing Wound #5 Right,Medial Lower Leg o Cleanse wound with mild soap and water o May shower with protection. o No tub bath. Skin Barriers/Peri-Wound Care Wound #5 Right,Medial Lower Leg o Barrier cream Primary Wound Dressing Wound #5 Right,Medial Lower Leg o Drawtex o Mepitel One Secondary Dressing Wound #5 Right,Medial Lower Leg o ABD pad o XtraSorb Dressing Change Frequency Wound #5 Right,Medial Lower Leg o Change dressing every week o Other: - As needed for drainage Follow-up Appointments Wound #5 Right,Medial Lower Leg o Return Appointment in 1 week. Edema Control Wound #5 Right,Medial Lower Leg o Unna Boot to Right Lower Extremity Additional Orders / Instructions Wound #5 Right,Medial Lower Leg [name], [name] ([id number]) o Other: - SNF nurse may change unna wrap as needed for copius drainage or odor. DO NOT REMOVE DRESSING BELOW DRAWTEX. PATIENT HAS SKIN SUBSTITUTE AND IT SHOULD NOT BE REMOVED SNF nurse is to  call [facility] prior to changing unna wrap for instruction r/t improper unna wrap Please call us at [phone] to be sure wrap is applied correctly prior to changing wrap. Speak with Selena Batten or Mardene Celeste o Other: - CARBOFLEX FOR ODOR CONTROL Advanced Therapies Wound #5 Right,Medial Lower Leg o Apligraf application in clinic; including contact layer, fixation with steri strips, dry gauze and cover dressing. Electronic Signature(s) Signed: 03/28/2015 2:34:10 PM By: Elpidio Eric BSN, RN Signed: 03/28/2015 3:44:09 PM By: Evlyn Kanner MD, FACS Previous Signature: 03/28/2015 2:10:23 PM Version By: Elpidio Eric BSN, RN Entered By: Elpidio Eric on 03/28/2015 14:34:10 [name], [name] ([id number]) -------------------------------------------------------------------------------- Problem List Details Patient Name: [name], [name] Date of Service: 03/28/2015 1:45 PM Medical Record Number: [id number] Patient Account Number: [id number] Date of Birth/Sex: [birth year] (79 y.o. 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 Date  of Service: 03/28/2015 1:45 PM Medical Record Number: [id number] Patient Account Number: [id number] Date of Birth/Sex: August 11, 1935 (79 y.o. Male) Treating RN: Clover Mealy, RN, BSN, [place] Sink Primary Care Physician: Terance Hart, DAVID Other Clinician: Referring Physician: Dorothey Baseman Treating Physician/Extender: Rudene Re in Treatment: 10 Subjective Chief Complaint Information obtained from Patient Patient presents for treatment of an open ulcer due to venous insufficiency. The patient has had a open wound on his right medial ankle for at least 3 years and was last seen in the wound clinic in February 2015. He was lost to follow-up after that. History of Present Illness (HPI) The following HPI elements were documented for the patient's wound: Location: right lower extremity ulceration  Quality: Patient reports experiencing a dull pain to affected area(s). Severity: Patient states wound are getting worse. 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 Modifying Factors: Consults to this date include:has received treatment in the wound center in the past but stopped coming here since February 2015 Associated Signs and Symptoms: Patient reports having difficulty standing for long periods. 79 year old male who is known to have progressive weakness and has been in a nursing home for a while has had chronic lower extremity edema both legs and a large open wound on the right lower extremity which is has at least for about 3-4 years. The patient was seen in the wound clinic before and has been treated until February 2015 when he was lost to follow-up. Past medical history is significant for hypertension, gout, venous  stasis ulcers, Parkinson's [name], constipation. He's not been a diabetic but his last hemoglobin A1c was 6 in March 2015. There is documentation that he's had endovenous ablation of bilateral varicose veins but we have no documentation about this and this may be done at least 4-5 years ago. 01/22/2015 -- he has his vascular test is scheduled for this afternoon. 01/29/2015 -- the patient's vascular test was canceled last week because he was unable to get onto the examining bed at [facility]. His Unna's boot was not applied either and the patient had a dressing placed by home health. Today when his dressing was removed we found a lot of maggots in his right lower extremity. 02/07/2015 -- Was seen in the vascular office by the PA and she noted that a bilateral venous duplex study did not show any DVT, SVT or reflux bilaterally. The patient was advised to continue with Unna's boot and would at some stage to require graduated compression stockings of the 20-30 mmHg variety. In the future lymphedema pumps would also be considered. 02/14/2015 -- his dressing is smelling quite a bit and there is a discoloration of the wound suggestive of an TALMAGE, HORRY. ([id number]) infection. Deep tissue cultures will be taken today. 02/21/2015 -- the culture is back and it has growth moderate growth of Proteus and gram-negative rods sensitive to sulfa, ampicillin, cephazolin, Zosyn. He is already on doxycycline and his wound is looking clean and hence we will continue with this. 02/28/2015 -- he's been doing fine still on his anti-buttocks and has no fresh issues. 03/07/2015 -- I was told that because he lives in a nursing home the Apligraf was denied by his insurance company. 03/14/2015 -- we are awaiting samples and a trial of Puraply to be tried for his ulceration. 03/21/2015 -- his Apligraf was approved and he is here for his first application of Apligraf 03/28/2015 -- his wound has had quite a bit of  secretion and needs to be changed and hence I will use the second application of Apligraf. Objective Constitutional Pulse regular. Respirations normal and unlabored. Afebrile. Vitals Time Taken: 2:49 PM, Height: 68 in, Temperature: 97.7 F, Pulse: 65 bpm, Respiratory Rate: 18 breaths/min, Blood Pressure: 142/54 mmHg. Eyes Nonicteric. Reactive to light. Ears, Nose, Mouth, and Throat Lips, teeth, and gums WNL.Marland Kitchen Moist mucosa without lesions . Neck supple and nontender. No palpable supraclavicular or  cervical adenopathy. Normal sized without goiter. Respiratory WNL. No retractions.. Cardiovascular Pedal Pulses WNL. No clubbing, cyanosis or edema. Lymphatic No adneopathy. No adenopathy. No adenopathy. Musculoskeletal Adexa without tenderness or enlargement.. Digits and nails w/o clubbing, cyanosis, infection, petechiae, ischemia, or inflammatory conditions.Marland Kitchen Psychiatric Judgement and insight Intact.. No evidence of depression, anxiety, or agitation.Marland Kitchen JAYLAND, NULL ([id number]) General Notes: The wound was a bit soupy and needed cleaning with saline so I washed it out nicely debrided some of the slough and will apply the second layer Apligraf today. Integumentary (Hair, Skin) No suspicious lesions. No crepitus or fluctuance. No peri-wound warmth or erythema. No masses.. Wound #5 status is Open. Original cause of wound was Gradually Appeared. The wound is located on the Right,Medial Lower Leg. The wound measures 9.5cm length x 5.5cm width x 0.2cm depth; 41.037cm^2 area and 8.207cm^3 volume. The wound is limited to skin breakdown. There is no tunneling or undermining noted. There is a large amount of serosanguineous drainage noted. The wound margin is distinct with the outline attached to the wound base. There is large (67-100%) pink, pale granulation within the wound bed. There is no necrotic tissue within the wound bed. The periwound skin appearance exhibited: Localized Edema,  Moist, Mottled. The periwound skin appearance did not exhibit: Callus, Crepitus, Excoriation, Fluctuance, Friable, Induration, Rash, Scarring, Dry/Scaly, Maceration, Atrophie Blanche, Cyanosis, Ecchymosis, Hemosiderin Staining, Pallor, Rubor, Erythema. Periwound temperature was noted as No Abnormality. 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 We would use Gore-Tex over this and some CarboFlex and then use an own Unna's boot. He will come back and see as next week. Procedures Wound #5 Wound #5 is a Venous Leg Ulcer located on the Right,Medial Lower Leg. A skin graft procedure using a bioengineered skin substitute/cellular or tissue based product was performed by Britto, Ignacia Felling., MD. Apligraf was applied and secured with Steri-Strips. 44 sq cm of product was utilized and 0 sq cm was wasted. Post Application, mepitel was applied. A Time Out was conducted prior to the start of the procedure. The KALIL, WOESSNER. ([id number]) procedure was tolerated well with a pain level of 0 throughout and a pain level of 0 following the procedure. Post procedure Diagnosis Wound #5: Same as Pre-Procedure . Plan Wound Cleansing: Wound #5 Right,Medial Lower Leg: Cleanse wound with mild soap and water May shower with protection. No tub bath. Skin Barriers/Peri-Wound Care: Wound #5 Right,Medial Lower Leg: Barrier cream Primary Wound Dressing: Wound #5 Right,Medial Lower Leg: Drawtex Mepitel One Secondary Dressing: Wound #5 Right,Medial Lower Leg: ABD pad XtraSorb Dressing Change Frequency: Wound #5 Right,Medial Lower  Leg: Change dressing every week Other: - As needed for drainage Follow-up Appointments: Wound #5 Right,Medial Lower Leg: Return Appointment in 1 week. Edema Control: Wound #5 Right,Medial Lower Leg: Unna Boot to Right Lower Extremity Additional Orders / Instructions: Wound #5 Right,Medial Lower Leg: Other: - SNF nurse may change unna wrap as needed for copius drainage or odor. DO NOT REMOVE DRESSING BELOW DRAWTEX. PATIENT HAS SKIN SUBSTITUTE AND IT SHOULD NOT BE REMOVED SNF nurse is to call [facility] prior to changing unna wrap for instruction r/t improper unna wrap Please call  us at [phone] to be sure wrap is applied correctly prior to changing wrap. Speak with Selena Batten or Mardene Celeste Other: - CARBOFLEX FOR ODOR CONTROL Advanced Therapies: Wound #5 Right,Medial Lower Leg: Apligraf application in clinic; including contact layer, fixation with steri strips, dry gauze and cover dressing. OMARE, BILOTTA ([id number]) After cleaning the wound nicely with moist saline gauze and abrading the debris I applied the second layer of Apligraf today. We would use Gore-Tex over this and some CarboFlex and then use an own Unna's boot. He will come back and see as next week. Electronic Signature(s) Signed: 03/28/2015 2:40:21 PM By: Evlyn Kanner MD, FACS Entered By: Evlyn Kanner on 03/28/2015 14:40:21 [name], [name] Chrissie Noa ([id number]) -------------------------------------------------------------------------------- SuperBill Details Patient Name: TAVARUS, POTEETE. Date of Service: 03/28/2015 Medical Record Number: [id number] Patient Account Number: [id number] Date of Birth/Sex: [birth year] (79 y.o. 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 E66.01 Morbid (severe) obesity due to excess calories I89.0 Lymphedema, not elsewhere classified Facility Procedures CPT4 Code Description: [id number] (Facility Use Only) Apligraf 1 SQ CM Modifier: Quantity: 44 CPT4 Code Description: 32440102 15271 - SKIN SUB GRAFT TRNK/ARM/LEG ICD-10 Description Diagnosis I87.311 Chronic venous hypertension (idiopathic) with ulcer of 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 E66.01 Morbid (severe) obesity  due to excess calories I89.0 Lymphedema, not elsewhere classified Modifier: right lower s of muscle Quantity: 1 extremity CPT4 Code Description: [id number] 15272 - SKIN SUB GRAFT T/A/L ADD-ON ICD-10 Description Diagnosis I87.311 Chronic venous hypertension (idiopathic) with ulcer of 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 E66.01 Morbid (severe) obesity  due to excess calories I89.0 Lymphedema, not elsewhere classified Modifier: right lower s of muscle Quantity: 1 extremity Physician Procedures CPT4 Code Description: [id number] 15271 - WC PHYS SKIN SUB GRAFT TRNK/ARM/LEG ICD-10 Description Diagnosis I87.311 Chronic venous hypertension (idiopathic) with ulcer of 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s BRANT, PEETS  ([id number]) Modifier: right lower of muscle Quantity: 1 extremity Electronic Signature(s) Signed: 03/28/2015 2:40:41 PM By: Evlyn Kanner MD, FACS Entered By: Evlyn Kanner on 03/28/2015 14:40:41

## 2015-03-29 NOTE — Progress Notes (Signed)
[name], [name] ([id number]) Visit Report for 03/28/2015 Arrival Information Details Patient Name: [name], [name] Date of Service: 03/28/2015 1:45 PM Medical Record Number: [id number] Patient Account Number: [id number] Date of Birth/Sex: [birth year] (79 y.o. Male) Treating RN: [name] Mealy, RN, BSN, [place] Sink Primary Care Physician: [name], [name] Other Clinician: Referring Physician: Terance [name], [name] Treating Physician/Extender: [name] in Treatment: 10 Visit Information History Since Last Visit Added or deleted any medications: No Patient Arrived: Wheel Chair Had a fall or experienced change in No activities of daily living that may affect Arrival Time: 13:48 risk of falls: Accompanied By: self Signs or symptoms of abuse/neglect since last No Transfer Assistance: None visito Patient Identification Verified: Yes Has Dressing in Place as Prescribed: Yes Secondary Verification Process Yes Has Compression in Place as Prescribed: Yes Completed: Pain Present Now: No Patient Requires Transmission-Based No Precautions: Patient Has Alerts: No Electronic Signature(s) Signed: 03/28/2015 1:49:36 PM By: [name] BSN, RN Entered By: [name] on 03/28/2015 13:49:36 [name], [name] ([id number]) -------------------------------------------------------------------------------- Encounter Discharge Information Details Patient Name: [name] Date of Service: 03/28/2015 1:45 PM Medical Record Number: [id number] Patient Account Number: [id number] Date of Birth/Sex: 07/24/1935 (79 y.o. Male) Treating RN: [name] Mealy, RN, BSN, [place] Sink Primary Care Physician: [name], [name] Other Clinician: Referring Physician: Dorothey Baseman Treating Physician/Extender: [name] in Treatment: 10 Encounter Discharge Information Items Schedule Follow-up Appointment: No Medication Reconciliation completed No and provided to Patient/Care [name]: Provided on Clinical Summary of  Care: 03/28/2015 Form Type Recipient Paper Patient [facility] Electronic Signature(s) Signed: 03/28/2015 2:29:07 PM By: [name] Entered By: [name] on 03/28/2015 14:29:06 [name], [name] ([id number]) -------------------------------------------------------------------------------- Lower Extremity Assessment Details Patient Name: [name], [name]. Date of Service: 03/28/2015 1:45 PM Medical Record Number: [id number] Patient Account Number: [id number] Date of Birth/Sex: 26-Jul-1935 (79 y.o. Male) Treating RN: [name] Mealy, RN, BSN, [place] Sink Primary Care Physician: [name], [name] Other Clinician: Referring Physician: Terance [name], [name] Treating Physician/Extender: [name] in Treatment: 10 Edema Assessment Assessed: [Left: No] [Right: No] E[Left: dema] [Right: :] Calf Left: Right: Point of Measurement: 40 cm From Medial Instep cm 38.4 cm Ankle Left: Right: Point of Measurement: 10 cm From Medial Instep cm 29.2 cm Vascular Assessment Claudication: Claudication Assessment [Right:None] Pulses: Posterior Tibial Dorsalis Pedis Palpable: [Right:Yes] Extremity colors, hair growth, and conditions: Extremity Color: [Right:Hyperpigmented] Hair Growth on Extremity: [Right:No] Temperature of Extremity: [Right:Warm] Capillary Refill: [Right:< 3 seconds] Toe Nail Assessment Left: Right: Thick: Yes Discolored: Yes Deformed: Yes Improper Length and Hygiene: Yes Electronic Signature(s) Signed: 03/28/2015 1:50:50 PM By: [name] BSN, RN Entered By: [name] on 03/28/2015 13:50:50 [name] ([id number]) [name] ([id number]) -------------------------------------------------------------------------------- Multi Wound Chart Details Patient Name: [name] Date of Service: 03/28/2015 1:45 PM Medical Record Number: [id number] Patient Account Number: [id number] Date of Birth/Sex: Aug 02, 1935 (79 y.o. Male) Treating RN: [name] Mealy, RN, BSN,  Sink Primary Care  Physician: [name], [name] Other Clinician: Referring Physician: Terance [name], [name] Treating Physician/Extender: [name] in Treatment: 10 Vital Signs Height(in): 68 Pulse(bpm): 65 Weight(lbs): Blood Pressure 142/54 (mmHg): Body Mass Index(BMI): Temperature(F): 97.7 Respiratory Rate 18 (breaths/min): Photos: [5:No Photos] [N/A:N/A] Wound Location: [5:Right Lower Leg - Medial] [N/A:N/A] Wounding Event: [5:Gradually Appeared] [N/A:N/A] Primary Etiology: [5:Venous Leg Ulcer] [N/A:N/A] Comorbid History: [5:Cataracts, Anemia, Hypertension, Osteoarthritis] [N/A:N/A] Date Acquired: [5:11/13/2014] [N/A:N/A] Weeks of Treatment: [5:10] [N/A:N/A] Wound Status: [5:Open] [N/A:N/A] Measurements L x W x D 9.5x5.5x0.2 [N/A:N/A] (cm) Area (cm) : [5:41.037] [N/A:N/A] Volume (cm) : [5:8.207] [N/A:N/A] % Reduction in Area: [5:60.00%] [N/A:N/A] % Reduction in  Volume: 73.30% [N/A:N/A] Classification: [5:Full Thickness Without Exposed Support Structures] [N/A:N/A] Exudate Amount: [5:Large] [N/A:N/A] Exudate Type: [5:Serosanguineous] [N/A:N/A] Exudate Color: [5:red, brown] [N/A:N/A] Foul Odor After [5:Yes] [N/A:N/A] Cleansing: Odor Anticipated Due to No [N/A:N/A] Product Use: Wound Margin: [5:Distinct, outline attached] [N/A:N/A] Granulation Amount: [5:Large (67-100%)] [N/A:N/A] Granulation Quality: [5:Pink, Pale] [N/A:N/A] Necrotic Amount: [5:None Present (0%)] [N/A:N/A] Exposed Structures: Fascia: No N/A N/A Fat: No Tendon: No Muscle: No Joint: No Bone: No Limited to Skin Breakdown Epithelialization: Small (1-33%) N/A N/A Periwound Skin Texture: Edema: Yes N/A N/A Excoriation: No Induration: No Callus: No Crepitus: No Fluctuance: No Friable: No Rash: No Scarring: No Periwound Skin Moist: Yes N/A N/A Moisture: Maceration: No Dry/Scaly: No Periwound Skin Color: Mottled: Yes N/A N/A Atrophie Blanche: No Cyanosis: No Ecchymosis: No Erythema: No Hemosiderin  Staining: No Pallor: No Rubor: No Temperature: No Abnormality N/A N/A Tenderness on No N/A N/A Palpation: Wound Preparation: Ulcer Cleansing: Other: N/A N/A soap and water Topical Anesthetic Applied: None Treatment Notes Electronic Signature(s) Signed: 03/28/2015 2:09:41 PM By: [name] BSN, RN Entered By: [name] on 03/28/2015 14:09:41 [name] ([id number]) -------------------------------------------------------------------------------- Multi-Disciplinary Care Plan Details Patient Name: [name], [name] Date of Service: 03/28/2015 1:45 PM Medical Record Number: [id number] Patient Account Number: [id number] Date of Birth/Sex: 1936-05-30 (79 y.o. Male) Treating RN: [name] Mealy, RN, BSN, [place] Sink Primary Care Physician: [name], [name] Other Clinician: Referring Physician: Terance [name], [name] Treating Physician/Extender: [name] in Treatment: 10 Active Inactive Orientation to the Wound Care Program Nursing Diagnoses: Knowledge deficit related to the wound healing center program Goals: Patient/caregiver will verbalize understanding of the Wound Healing Center Program Date Initiated: 01/15/2015 Goal Status: Active Interventions: Provide education on orientation to the wound center Notes: Venous Leg Ulcer Nursing Diagnoses: Knowledge deficit related to disease process and management Potential for venous Insuffiency (use before diagnosis confirmed) Goals: Patient will maintain optimal edema control Date Initiated: 01/15/2015 Goal Status: Active Patient/caregiver will verbalize understanding of disease process and disease management Date Initiated: 01/15/2015 Goal Status: Active Verify adequate tissue perfusion prior to therapeutic compression application Date Initiated: 01/15/2015 Goal Status: Active Interventions: Assess peripheral edema status every visit. Compression as ordered Provide education on venous insufficiency [name], [name]  ([id number]) Treatment Activities: Test ordered outside of clinic : 03/28/2015 Therapeutic compression applied : 03/28/2015 Venous Duplex Doppler : 03/28/2015 Notes: Wound/Skin Impairment Nursing Diagnoses: Impaired tissue integrity Knowledge deficit related to ulceration/compromised skin integrity Goals: Patient/caregiver will verbalize understanding of skin care regimen Date Initiated: 01/15/2015 Goal Status: Active Ulcer/skin breakdown will have a volume reduction of 30% by week 4 Date Initiated: 01/15/2015 Goal Status: Active Ulcer/skin breakdown will have a volume reduction of 50% by week 8 Date Initiated: 01/15/2015 Goal Status: Active Ulcer/skin breakdown will have a volume reduction of 80% by week 12 Date Initiated: 01/15/2015 Goal Status: Active Ulcer/skin breakdown will heal within 14 weeks Date Initiated: 01/15/2015 Goal Status: Active Interventions: Assess patient/caregiver ability to perform ulcer/skin care regimen upon admission and as needed Assess ulceration(s) every visit Provide education on ulcer and skin care Notes: Electronic Signature(s) Signed: 03/28/2015 2:09:35 PM By: [name] BSN, RN Entered By: [name] on 03/28/2015 14:09:35 [name], [name] ([id number]) -------------------------------------------------------------------------------- Pain Assessment Details Patient Name: [name] Date of Service: 03/28/2015 1:45 PM Medical Record Number: [id number] Patient Account Number: [id number] Date of Birth/Sex: [birth year] (79 y.o. Male) Treating RN: [name] Mealy, RN, BSN, [place] Sink Primary Care Physician: Dorothey Baseman Other Clinician: Referring Physician: Terance [name] [name] Treating Physician/Extender: [name] in Treatment: 10 Active Problems  Location of Pain Severity and Description of Pain Patient Has Paino No Site Locations Pain Management and Medication Current Pain Management: Electronic Signature(s) Signed: 03/28/2015 1:49:42 PM By:  [name] BSN, RN Entered By: [name] on 03/28/2015 13:49:41 [name], [name] ([id number]) -------------------------------------------------------------------------------- Wound Assessment Details Patient Name: [name], [name] Date of Service: 03/28/2015 1:45 PM Medical Record Number: [id number] Patient Account Number: [id number] Date of Birth/Sex: [birth year] (79 y.o. Male) Treating RN: [name] Mealy, RN, BSN, Rita Primary Care Physician: [name], [name] Other Clinician: Referring Physician: Terance [name], [name] Treating Physician/Extender: [name] in Treatment: 10 Wound Status Wound Number: 5 Primary Venous Leg Ulcer Etiology: Wound Location: Right Lower Leg - Medial Wound Status: Open Wounding Event: Gradually Appeared Comorbid Cataracts, Anemia, Hypertension, Date Acquired: 11/13/2014 History: Osteoarthritis Weeks Of Treatment: 10 Clustered Wound: No Photos Photo Uploaded By: [name] on 03/28/2015 15:31:02 Wound Measurements Length: (cm) 9.5 Width: (cm) 5.5 Depth: (cm) 0.2 Area: (cm) 41.037 Volume: (cm) 8.207 % Reduction in Area: 60% % Reduction in Volume: 73.3% Epithelialization: Small (1-33%) Tunneling: No Undermining: No Wound Description Full Thickness Without Exposed Classification: Support Structures Wound Margin: Distinct, outline attached Exudate Large Amount: Exudate Type: Serosanguineous Exudate Color: red, brown Foul Odor After Cleansing: Yes Due to Product Use: No Wound Bed Granulation Amount: Large (67-100%) Exposed Structure Granulation Quality: Pink, Pale Fascia Exposed: No Necrotic Amount: None Present (0%) Fat Layer Exposed: No [name], [name] ([id number]) Tendon Exposed: No Muscle Exposed: No Joint Exposed: No Bone Exposed: No Limited to Skin Breakdown Periwound Skin Texture Texture Color No Abnormalities Noted: No No Abnormalities Noted: No Callus: No Atrophie Blanche: No Crepitus: No Cyanosis:  No Excoriation: No Ecchymosis: No Fluctuance: No Erythema: No Friable: No Hemosiderin Staining: No Induration: No Mottled: Yes Localized Edema: Yes Pallor: No Rash: No Rubor: No Scarring: No Temperature / Pain Moisture Temperature: No Abnormality No Abnormalities Noted: No Dry / Scaly: No Maceration: No Moist: Yes Wound Preparation Ulcer Cleansing: Other: soap and water, Topical Anesthetic Applied: None Electronic Signature(s) Signed: 03/28/2015 1:57:17 PM By: [name] BSN, RN Entered By: [name] on 03/28/2015 13:57:17 [name] ([id number]) -------------------------------------------------------------------------------- Vitals Details Patient Name: [name] Date of Service: 03/28/2015 1:45 PM Medical Record Number: [id number] Patient Account Number: [id number] Date of Birth/Sex: 06-22-1936 (79 y.o. Male) Treating RN: [name] Mealy, RN, BSN, Rita Primary Care Physician: [name], [name] Other Clinician: Referring Physician: Terance [name], [name] Treating Physician/Extender: [name] in Treatment: 10 Vital Signs Time Taken: 14:49 Temperature (F): 97.7 Height (in): 68 Pulse (bpm): 65 Respiratory Rate (breaths/min): 18 Blood Pressure (mmHg): 142/54 Reference Range: 80 - 120 mg / dl Electronic Signature(s) Signed: 03/28/2015 1:50:22 PM By: [name] BSN, RN Entered By: [name] on 03/28/2015 13:50:21

## 2015-04-04 ENCOUNTER — Encounter: Payer: Medicare Other | Admitting: Surgery

## 2015-04-04 DIAGNOSIS — L97313 Non-pressure chronic ulcer of right ankle with necrosis of muscle: Secondary | ICD-10-CM | POA: Diagnosis not present

## 2015-04-05 NOTE — Progress Notes (Signed)
WEIKKO, LANTZY ([id number]) Visit Report for 04/04/2015 Arrival Information Details Patient Name: [name], [name] Date of Service: 04/04/2015 2:30 PM Medical Record Number: [id number] Patient Account Number: [id number] Date of Birth/Sex: 02/10/1936 (79 y.o. Male) Treating RN: Curtis Sites Primary Care Physician: Dorothey Baseman Other Clinician: Referring Physician: Terance Hart, DAVID Treating Physician/Extender: Rudene Re in Treatment: 11 Visit Information History Since Last Visit Added or deleted any medications: No Patient Arrived: Wheel Chair Any new allergies or adverse reactions: No Arrival Time: 14:30 Had a fall or experienced change in No activities of daily living that may affect Accompanied By: self risk of falls: Transfer Assistance: None Signs or symptoms of abuse/neglect since last No Patient Identification Verified: Yes visito Secondary Verification Process Yes Hospitalized since last visit: No Completed: Pain Present Now: No Patient Requires Transmission-Based No Precautions: Patient Has Alerts: No Electronic Signature(s) Signed: 04/04/2015 5:17:19 PM By: Curtis Sites Entered By: Curtis Sites on 04/04/2015 14:30:54 [name] ([id number]) -------------------------------------------------------------------------------- Encounter Discharge Information Details Patient Name: [name] Date of Service: 04/04/2015 2:30 PM Medical Record Number: [id number] Patient Account Number: [id number] Date of Birth/Sex: [birth year] (79 y.o. Male) Treating RN: Curtis Sites Primary Care Physician: Dorothey Baseman Other Clinician: Referring Physician: Dorothey Baseman Treating Physician/Extender: Rudene Re in Treatment: 11 Encounter Discharge Information Items Discharge Pain Level: 0 Discharge Condition: Stable Ambulatory Status: Wheelchair Discharge Destination: Nursing Home Transportation: Private Auto Accompanied By:  self Schedule Follow-up Appointment: Yes Medication Reconciliation completed and provided to Patient/Care No Provider: Provided on Clinical Summary of Care: 04/04/2015 Form Type Recipient Paper Patient [facility] Electronic Signature(s) Signed: 04/04/2015 3:12:48 PM By: Curtis Sites Previous Signature: 04/04/2015 3:02:02 PM Version By: Gwenlyn Perking Entered By: Curtis Sites on 04/04/2015 15:12:48 [name] ([id number]) -------------------------------------------------------------------------------- Lower Extremity Assessment Details Patient Name: [name] Date of Service: 04/04/2015 2:30 PM Medical Record Number: [id number] Patient Account Number: [id number] Date of Birth/Sex: [birth year] (79 y.o. Male) Treating RN: Curtis Sites Primary Care Physician: Terance Hart, DAVID Other Clinician: Referring Physician: Terance Hart, DAVID Treating Physician/Extender: Rudene Re in Treatment: 11 Edema Assessment Assessed: [Left: No] Franne Forts: No] Edema: [Left: Ye] [Right: s] Calf Left: Right: Point of Measurement: 40 cm From Medial Instep cm 38.8 cm Ankle Left: Right: Point of Measurement: 10 cm From Medial Instep cm 29.8 cm Vascular Assessment Pulses: Posterior Tibial Dorsalis Pedis Palpable: [Right:Yes] Extremity colors, hair growth, and conditions: Extremity Color: [Right:Hyperpigmented] Hair Growth on Extremity: [Right:No] Temperature of Extremity: [Right:Warm] Capillary Refill: [Right:< 3 seconds] Electronic Signature(s) Signed: 04/04/2015 5:17:19 PM By: Curtis Sites Entered By: Curtis Sites on 04/04/2015 14:45:39 [name] ([id number]) -------------------------------------------------------------------------------- Multi Wound Chart Details Patient Name: [name] Date of Service: 04/04/2015 2:30 PM Medical Record Number: [id number] Patient Account Number: [id number] Date of Birth/Sex: 1935-08-24 (79 y.o. Male) Treating RN: Curtis Sites Primary Care Physician: Dorothey Baseman Other Clinician: Referring Physician: Terance Hart, DAVID Treating Physician/Extender: Rudene Re in Treatment: 11 Vital Signs Height(in): 68 Pulse(bpm): 66 Weight(lbs): Blood Pressure 157/63 (mmHg): Body Mass Index(BMI): Temperature(F): 97.9 Respiratory Rate 18 (breaths/min): Wound Assessments Treatment Notes Electronic Signature(s) Signed: 04/04/2015 5:17:19 PM By: Curtis Sites Entered By: Curtis Sites on 04/04/2015 14:49:32 Efran, Blaker Chrissie Noa ([id number]) -------------------------------------------------------------------------------- Multi-Disciplinary Care Plan Details Patient Name: [name] Date of Service: 04/04/2015 2:30 PM Medical Record Number: [id number] Patient Account Number: [id number] Date of Birth/Sex: 1936-03-03 (79 y.o. Male) Treating RN: Curtis Sites Primary Care Physician: Dorothey Baseman Other Clinician: Referring Physician: Dorothey Baseman Treating Physician/Extender: Rudene Re in Treatment: 11 Active  Inactive Orientation to the Wound Care Program Nursing Diagnoses: Knowledge deficit related to the wound healing center program Goals: Patient/caregiver will verbalize understanding of the Wound Healing Center Program Date Initiated: 01/15/2015 Goal Status: Active Interventions: Provide education on orientation to the wound center Notes: Venous Leg Ulcer Nursing Diagnoses: Knowledge deficit related to disease process and management Potential for venous Insuffiency (use before diagnosis confirmed) Goals: Patient will maintain optimal edema control Date Initiated: 01/15/2015 Goal Status: Active Patient/caregiver will verbalize understanding of disease process and disease management Date Initiated: 01/15/2015 Goal Status: Active Verify adequate tissue perfusion prior to therapeutic compression application Date Initiated: 01/15/2015 Goal Status:  Active Interventions: Assess peripheral edema status every visit. Compression as ordered Provide education on venous insufficiency [name] ([id number]) Treatment Activities: Test ordered outside of clinic : 04/04/2015 Therapeutic compression applied : 04/04/2015 Venous Duplex Doppler : 04/04/2015 Notes: Wound/Skin Impairment Nursing Diagnoses: Impaired tissue integrity Knowledge deficit related to ulceration/compromised skin integrity Goals: Patient/caregiver will verbalize understanding of skin care regimen Date Initiated: 01/15/2015 Goal Status: Active Ulcer/skin breakdown will have a volume reduction of 30% by week 4 Date Initiated: 01/15/2015 Goal Status: Active Ulcer/skin breakdown will have a volume reduction of 50% by week 8 Date Initiated: 01/15/2015 Goal Status: Active Ulcer/skin breakdown will have a volume reduction of 80% by week 12 Date Initiated: 01/15/2015 Goal Status: Active Ulcer/skin breakdown will heal within 14 weeks Date Initiated: 01/15/2015 Goal Status: Active Interventions: Assess patient/caregiver ability to perform ulcer/skin care regimen upon admission and as needed Assess ulceration(s) every visit Provide education on ulcer and skin care Notes: Electronic Signature(s) Signed: 04/04/2015 5:17:19 PM By: Curtis Sites Entered By: Curtis Sites on 04/04/2015 14:49:20 [name] ([id number]) -------------------------------------------------------------------------------- Patient/Caregiver Education Details Patient Name: [name] Date of Service: 04/04/2015 2:30 PM Medical Record Number: [id number] Patient Account Number: [id number] Date of Birth/Gender: 1936-05-20 (79 y.o. Male) Treating RN: Curtis Sites Primary Care Physician: Dorothey Baseman Other Clinician: Referring Physician: Dorothey Baseman Treating Physician/Extender: Rudene Re in Treatment: 11 Education Assessment Education Provided  To: Patient Education Topics Provided Venous: Handouts: Other: come in for next apt on Tuesday Methods: Demonstration, Explain/Verbal Responses: State content correctly Electronic Signature(s) Signed: 04/04/2015 3:13:23 PM By: Curtis Sites Entered By: Curtis Sites on 04/04/2015 15:13:22 ALEXXANDER, KURT ([id number]) -------------------------------------------------------------------------------- Wound Assessment Details Patient Name: [name] Date of Service: 04/04/2015 2:30 PM Medical Record Number: [id number] Patient Account Number: [id number] Date of Birth/Sex: [birth year] (79 y.o. Male) Treating RN: Curtis Sites Primary Care Physician: Dorothey Baseman Other Clinician: Referring Physician: Terance Hart, DAVID Treating Physician/Extender: Rudene Re in Treatment: 11 Wound Status Wound Number: 5 Primary Etiology: Venous Leg Ulcer Wound Location: Right, Medial Lower Leg Wound Status: Open Wounding Event: Gradually Appeared Date Acquired: 11/13/2014 Weeks Of Treatment: 11 Clustered Wound: No Photos Photo Uploaded By: Curtis Sites on 04/04/2015 15:09:45 Wound Measurements Length: (cm) 9.5 Width: (cm) 5.4 Depth: (cm) 0.3 Area: (cm) 40.291 Volume: (cm) 12.087 % Reduction in Area: 60.7% % Reduction in Volume: 60.7% Wound Description Full Thickness Without Exposed Classification: Support Structures Periwound Skin Texture Texture Color No Abnormalities Noted: No No Abnormalities Noted: No Moisture No Abnormalities Noted: No Treatment Notes Wound #5 (Right, Medial Lower Leg) JESSEY, STEHLIN. ([id number]) 1. Cleansed with: Clean wound with Normal Saline Cleanse wound with antibacterial soap and water 3. Peri-wound Care: Barrier cream 4. Dressing Applied: Promogran Other dressing (specify in notes) 7. Secured with Henriette Combs to Right Lower Extremity Notes xtrasorb, drawtex, carboflex Electronic Signature(s) Signed: 04/04/2015 5:17:19  PM By: Curtis Sites Entered By: Curtis Sites on 04/04/2015 14:53:36 [name] ([id number]) -------------------------------------------------------------------------------- Vitals Details Patient Name: [name] Date of Service: 04/04/2015 2:30 PM Medical Record Number: [id number] Patient Account Number: [id number] Date of Birth/Sex: [birth year] (79 y.o. Male) Treating RN: Curtis Sites Primary Care Physician: Dorothey Baseman Other Clinician: Referring Physician: Terance Hart, DAVID Treating Physician/Extender: Rudene Re in Treatment: 11 Vital Signs Time Taken: 14:32 Temperature (F): 97.9 Height (in): 68 Pulse (bpm): 66 Respiratory Rate (breaths/min): 18 Blood Pressure (mmHg): 157/63 Reference Range: 80 - 120 mg / dl Electronic Signature(s) Signed: 04/04/2015 5:17:19 PM By: Curtis Sites Entered By: Curtis Sites on 04/04/2015 14:32:40

## 2015-04-05 NOTE — Progress Notes (Addendum)
SHONE, CECERE ([id number]) Visit Report for 04/04/2015 Chief Complaint Document Details Patient Name: [name], [name] Date of Service: 04/04/2015 2:30 PM Medical Record Number: [id number] Patient Account Number: [id number] Date of Birth/Sex: 03/20/1936 (79 y.o. Male) Treating RN: Curtis Sites Primary Care Physician: Dorothey Baseman Other Clinician: Referring Physician: Dorothey Baseman Treating Physician/Extender: Rudene Re in Treatment: 11 Information Obtained from: Patient Chief Complaint Patient presents for treatment of an open ulcer due to venous insufficiency. The patient has had a open wound on his right medial ankle for at least 3 years and was last seen in the wound clinic in February 2015. He was lost to follow-up after that. Electronic Signature(s) Signed: 04/04/2015 3:02:28 PM By: Evlyn Kanner MD, FACS Entered By: Evlyn Kanner on 04/04/2015 15:02:28 ASON, CASTROGIOVANNI ([id number]) -------------------------------------------------------------------------------- HPI Details Patient Name: [name], [name] Date of Service: 04/04/2015 2:30 PM Medical Record Number: [id number] Patient Account Number: [id number] Date of Birth/Sex: 1936-05-19 (79 y.o. Male) Treating RN: Curtis Sites Primary Care Physician: Dorothey Baseman Other Clinician: Referring Physician: Dorothey Baseman Treating Physician/Extender: Rudene Re in Treatment: 11 History of Present Illness Location: right lower extremity ulceration Quality: Patient reports experiencing a dull pain to affected area(s). Severity: Patient states wound are getting worse. 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 Modifying Factors: Consults to this date include:has received treatment in the wound center in the past but stopped coming here since February 2015 Associated Signs and Symptoms: Patient reports having difficulty standing for long periods. HPI Description: 79 year old male who is known to have progressive weakness and has been in a nursing home for a while has had chronic lower extremity edema both legs and a large open wound on the right lower extremity which is has at least for about 3-4 years. The patient was seen in the wound clinic before and has been treated until February 2015 when he was lost to follow-up. Past medical history is significant for hypertension, gout, venous stasis ulcers, Parkinson's [name], constipation. He's not been a diabetic but his last hemoglobin A1c was 6 in March 2015. There is documentation that he's had endovenous ablation of bilateral varicose veins but we have no documentation about this and this may be done at least 4-5 years ago. 01/22/2015 -- he has his vascular test is scheduled for this afternoon. 01/29/2015 -- the patient's vascular test was canceled last week because he was unable to get onto the examining bed at [facility]. His Unna's boot was not applied either and the patient had a dressing placed by home health. Today when his dressing was removed we found a lot of maggots in his right lower extremity. 02/07/2015 -- Was seen in the vascular office by the PA and she noted that a bilateral venous duplex study did not show any DVT, SVT or reflux bilaterally. The patient was advised to continue with Unna's boot and would at some stage to require graduated compression stockings of the 20-30 mmHg variety. In the future lymphedema pumps would also be considered. 02/14/2015 -- his dressing is smelling quite a bit and there is a discoloration of the wound suggestive of an infection. Deep tissue cultures will be taken today. 02/21/2015 --  the culture  is back and it has growth moderate growth of Proteus and gram-negative rods sensitive to sulfa, ampicillin, cephazolin, Zosyn. He is already on doxycycline and his wound is looking clean and hence we will continue with this. 02/28/2015 -- he's been doing fine still on his anti-buttocks and has no fresh issues. 03/07/2015 -- I was told that because he lives in a nursing home the Apligraf was denied by his insurance company. 03/14/2015 -- we are awaiting samples and a trial of Puraply to be tried for his ulceration. 03/21/2015 -- his Apligraf was approved and he is here for his first application of Apligraf 03/28/2015 -- his wound has had quite a bit of secretion and needs to be changed and hence I will use the LEGION, DISCHER. ([id number]) second application of Apligraf. 04/04/2015 -- he is here for a wound check but has a lot of secretions and hence his dressing needs to be taken down. Electronic Signature(s) Signed: 04/04/2015 3:02:56 PM By: Evlyn Kanner MD, FACS Entered By: Evlyn Kanner on 04/04/2015 15:02:56 LAQUON, EMEL ([id number]) -------------------------------------------------------------------------------- Physical Exam Details Patient Name: [name], [name] Date of Service: 04/04/2015 2:30 PM Medical Record Number: [id number] Patient Account Number: [id number] Date of Birth/Sex: 1936/04/06 (79 y.o. Male) Treating RN: Curtis Sites Primary Care Physician: Dorothey Baseman Other Clinician: Referring Physician: Terance Hart, DAVID Treating Physician/Extender: Rudene Re in Treatment: 11 Constitutional . Pulse regular. Respirations normal and unlabored. Afebrile. . Eyes Nonicteric. Reactive to light. Ears, Nose, Mouth, and Throat Lips, teeth, and gums WNL.Marland Kitchen Moist mucosa without lesions . Neck supple and nontender. No palpable supraclavicular or cervical adenopathy. Normal sized without goiter. Respiratory WNL. No retractions.. Cardiovascular Pedal Pulses  WNL. No clubbing, cyanosis or edema. Lymphatic No adneopathy. No adenopathy. No adenopathy. Musculoskeletal Adexa without tenderness or enlargement.. Digits and nails w/o clubbing, cyanosis, infection, petechiae, ischemia, or inflammatory conditions.. Integumentary (Hair, Skin) No suspicious lesions. No crepitus or fluctuance. No peri-wound warmth or erythema. No masses.Marland Kitchen Psychiatric Judgement and insight Intact.. No evidence of depression, anxiety, or agitation.. Notes Today the wound again was soupy with drainage and needed cleaning out profusely with saline. It also has a significant odor and I believe his secretions make it impossible to keep this for longer than a week. He may need dressing changes twice a week. Electronic Signature(s) Signed: 04/04/2015 3:04:16 PM By: Evlyn Kanner MD, FACS Entered By: Evlyn Kanner on 04/04/2015 15:04:15 NUMA, SCHROETER ([id number]) -------------------------------------------------------------------------------- Physician Orders Details Patient Name: [name], [name] Date of Service: 04/04/2015 2:30 PM Medical Record Number: [id number] Patient Account Number: [id number] Date of Birth/Sex: [birth year] (79 y.o. Male) Treating RN: Curtis Sites Primary Care Physician: Dorothey Baseman Other Clinician: Referring Physician: Dorothey Baseman Treating Physician/Extender: Rudene Re in Treatment: 11 Verbal / Phone Orders: Yes Clinician: Curtis Sites Read Back and Verified: Yes Diagnosis Coding Wound Cleansing Wound #5 Right,Medial Lower Leg o Cleanse wound with mild soap and water o May shower with protection. o No tub bath. Skin Barriers/Peri-Wound Care Wound #5 Right,Medial Lower Leg o Barrier cream Primary Wound Dressing Wound #5 Right,Medial Lower Leg o Promogran o Drawtex o Other: - carboflex Secondary Dressing Wound #5 Right,Medial Lower Leg o ABD pad o XtraSorb Dressing Change Frequency Wound #5  Right,Medial Lower Leg o Change dressing every week o Other: - As needed for drainage Follow-up Appointments Wound #5 Right,Medial Lower Leg o Other: - Tuesday Edema Control Wound #5 Right,Medial Lower Leg o Unna Boot to Right Lower Extremity Additional Orders / Instructions Hepburn,  Chrissie Noa ([id number]) Wound #5 Right,Medial Lower Leg o Other: - SNF nurse may change unna wrap as needed for copius drainage or odor. DO NOT REMOVE DRESSING BELOW DRAWTEX. PATIENT HAS SKIN SUBSTITUTE AND IT SHOULD NOT BE REMOVED SNF nurse is to call [facility] prior to changing unna wrap for instruction r/t improper unna wrap Please call us at [phone] to be sure wrap is applied correctly prior to changing wrap. Speak with Selena Batten or Mardene Celeste o Other: - CARBOFLEX FOR ODOR CONTROL Notes order Apligraf for Tuesday Electronic Signature(s) Signed: 04/04/2015 3:11:33 PM By: Curtis Sites Signed: 04/04/2015 5:16:51 PM By: Evlyn Kanner MD, FACS Entered By: Curtis Sites on 04/04/2015 15:11:31 [name], [name] ([id number]) -------------------------------------------------------------------------------- Problem List Details Patient Name: YU, PEGGS. Date of Service: 04/04/2015 2:30 PM Medical Record Number: [id number] Patient Account Number: [id number] Date of Birth/Sex: [birth year] (79 y.o. 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 Date of Service: 04/04/2015 2:30 PM Medical Record Number: [id number] Patient Account Number: [id number] Date of Birth/Sex: 1936-01-10 (79 y.o. Male) Treating RN: Curtis Sites Primary Care Physician: Dorothey Baseman Other Clinician: Referring Physician: Dorothey Baseman Treating Physician/Extender: Rudene Re in Treatment: 11 Subjective Chief Complaint Information obtained from Patient Patient presents for treatment of an open ulcer due to venous insufficiency. The patient has had a open wound on his right medial ankle for at least 3 years and was last seen in the wound clinic in February 2015. He was lost to follow-up after that. History of Present Illness (HPI) The following HPI elements were documented for the patient's wound: Location: right lower extremity ulceration Quality: Patient reports experiencing a dull pain to affected area(s). Severity: Patient states wound are getting worse. 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 Modifying Factors: Consults to this date include:has received treatment in the wound center in the past but stopped coming here since February 2015 Associated Signs and Symptoms: Patient reports having difficulty standing for long periods. 79 year old male who is known to have progressive weakness and has been in a nursing home for a  while has had chronic lower extremity edema both legs and a large open wound on the right lower extremity which is has at least for about 3-4 years. The patient was seen in the wound clinic before and has been treated until February 2015 when he was lost to follow-up. Past medical history is significant for hypertension, gout, venous stasis ulcers, Parkinson's [name], constipation. He's not been a diabetic but his last hemoglobin A1c was 6 in March 2015. There is documentation that  he's had endovenous ablation of bilateral varicose veins but we have no documentation about this and this may be done at least 4-5 years ago. 01/22/2015 -- he has his vascular test is scheduled for this afternoon. 01/29/2015 -- the patient's vascular test was canceled last week because he was unable to get onto the examining bed at [facility]. His Unna's boot was not applied either and the patient had a dressing placed by home health. Today when his dressing was removed we found a lot of maggots in his right lower extremity. 02/07/2015 -- Was seen in the vascular office by the PA and she noted that a bilateral venous duplex study did not show any DVT, SVT or reflux bilaterally. The patient was advised to continue with Unna's boot and would at some stage to require graduated compression stockings of the 20-30 mmHg variety. In the future lymphedema pumps would also be considered. 02/14/2015 -- his dressing is smelling quite a bit and there is a discoloration of the wound suggestive of an [name], [name] ([id number]) infection. Deep tissue cultures will be taken today. 02/21/2015 -- the culture is back and it has growth moderate growth of Proteus and gram-negative rods sensitive to sulfa, ampicillin, cephazolin, Zosyn. He is already on doxycycline and his wound is looking clean and hence we will continue with this. 02/28/2015 -- he's been doing fine still on his anti-buttocks and has no fresh issues. 03/07/2015 --  I was told that because he lives in a nursing home the Apligraf was denied by his insurance company. 03/14/2015 -- we are awaiting samples and a trial of Puraply to be tried for his ulceration. 03/21/2015 -- his Apligraf was approved and he is here for his first application of Apligraf 03/28/2015 -- his wound has had quite a bit of secretion and needs to be changed and hence I will use the second application of Apligraf. 04/04/2015 -- he is here for a wound check but has a lot of secretions and hence his dressing needs to be taken down. Objective Constitutional Pulse regular. Respirations normal and unlabored. Afebrile. Vitals Time Taken: 2:32 PM, Height: 68 in, Temperature: 97.9 F, Pulse: 66 bpm, Respiratory Rate: 18 breaths/min, Blood Pressure: 157/63 mmHg. Eyes Nonicteric. Reactive to light. Ears, Nose, Mouth, and Throat Lips, teeth, and gums WNL.Marland Kitchen Moist mucosa without lesions . Neck supple and nontender. No palpable supraclavicular or cervical adenopathy. Normal sized without goiter. Respiratory WNL. No retractions.. Cardiovascular Pedal Pulses WNL. No clubbing, cyanosis or edema. Lymphatic No adneopathy. No adenopathy. No adenopathy. Musculoskeletal Adexa without tenderness or enlargement.. Digits and nails w/o clubbing, cyanosis, infection, petechiae, ischemia, or inflammatory conditions.Marland Kitchen [name], [name] ([id number]) Psychiatric Judgement and insight Intact.. No evidence of depression, anxiety, or agitation.. General Notes: Today the wound again was soupy with drainage and needed cleaning out profusely with saline. It also has a significant odor and I believe his secretions make it impossible to keep this for longer than a week. He may need dressing changes twice a week. Integumentary (Hair, Skin) No suspicious lesions. No crepitus or fluctuance. No peri-wound warmth or erythema. No masses.. Wound #5 status is Open. Original cause of wound was Gradually Appeared. The  wound is located on the Right,Medial Lower Leg. The wound measures 9.5cm length x 5.4cm width x 0.3cm depth; 40.291cm^2 area and 12.087cm^3 volume. 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 I will see him back this coming Tuesday and make an assessment for reapplication of the Apligraf and maybe twice a week dressing changes. Plan Wound Cleansing: Wound #5 Right,Medial Lower Leg: Cleanse wound with mild soap and water May shower with protection. No tub bath. Skin Barriers/Peri-Wound Care: Wound #5 Right,Medial Lower Leg: Barrier cream Primary Wound Dressing: [name], [name] ([id number]) Wound #5 Right,Medial Lower Leg: Promogran Drawtex Other: - carboflex Secondary Dressing: Wound #5 Right,Medial Lower Leg: ABD pad XtraSorb Dressing Change Frequency: Wound #5 Right,Medial Lower Leg: Change dressing every week Other: - As needed for drainage Follow-up Appointments: Wound #5 Right,Medial Lower Leg: Other: - Tuesday Edema Control: Wound #5 Right,Medial Lower Leg: Unna Boot to Right Lower Extremity Additional Orders / Instructions: Wound #5 Right,Medial Lower Leg: Other: - SNF nurse may change unna wrap as needed for copius drainage or odor. DO NOT REMOVE DRESSING BELOW DRAWTEX. PATIENT HAS SKIN SUBSTITUTE AND IT SHOULD NOT BE REMOVED SNF nurse is to call [facility] prior to changing unna wrap for instruction r/t improper unna wrap Please call us at [phone] to be sure wrap is applied correctly prior to changing wrap. Speak with Selena Batten or Mardene Celeste Other: - CARBOFLEX FOR ODOR CONTROL General Notes: order Apligraf for  Tuesday I will use collagen today on the wound and use Drawtex and Carboflex with the Unna's boot. I will see him back this coming Tuesday and make an assessment for reapplication of the Apligraf and maybe twice a week dressing changes. Electronic Signature(s) Signed: 04/05/2015 4:24:08 PM By: Evlyn Kanner MD, FACS Previous Signature: 04/04/2015 3:05:47 PM Version By: Evlyn Kanner MD, FACS Entered By: Evlyn Kanner on 04/05/2015 16:24:08 [name], [name] ([id number]) -------------------------------------------------------------------------------- SuperBill Details Patient Name: [name], [name] Date of Service: 04/04/2015 Medical Record Number: [id number] Patient Account Number: [id number] Date of Birth/Sex: 01-16-1936 (79 y.o. 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 E66.01 Morbid (severe) obesity due to excess calories I89.0 Lymphedema, not elsewhere classified Facility Procedures CPT4: Description Modifier Quantity Code [id number] (Facility Use Only) [phone] - APPLY MULTLAY COMPRS LWR RT 1 LEG Physician Procedures CPT4 Code Description: [id number] - WC PHYS LEVEL 3 - EST PT ICD-10 Description Diagnosis I87.311 Chronic venous hypertension (idiopathic) with ulcer of I89.0 Lymphedema, not elsewhere classified L97.313 Non-pressure chronic ulcer of right ankle with  necrosi Modifier: right lower s of muscle Quantity: 1 extremity Electronic Signature(s) Signed: 04/04/2015 3:11:02 PM By: Curtis Sites Signed: 04/04/2015 5:16:51 PM By: Evlyn Kanner MD, FACS Previous Signature: 04/04/2015 3:06:23 PM Version By: Evlyn Kanner MD,  FACS Entered By: Curtis Sites on 04/04/2015 15:11:02

## 2015-04-09 ENCOUNTER — Encounter: Payer: Medicare Other | Attending: Surgery | Admitting: Surgery

## 2015-04-09 DIAGNOSIS — G20 Parkinson's disease: Secondary | ICD-10-CM | POA: Insufficient documentation

## 2015-04-09 DIAGNOSIS — I890 Lymphedema, not elsewhere classified: Secondary | ICD-10-CM | POA: Diagnosis not present

## 2015-04-09 DIAGNOSIS — I10 Essential (primary) hypertension: Secondary | ICD-10-CM | POA: Insufficient documentation

## 2015-04-09 DIAGNOSIS — L97313 Non-pressure chronic ulcer of right ankle with necrosis of muscle: Secondary | ICD-10-CM | POA: Insufficient documentation

## 2015-04-09 DIAGNOSIS — I87311 Chronic venous hypertension (idiopathic) with ulcer of right lower extremity: Secondary | ICD-10-CM | POA: Insufficient documentation

## 2015-04-10 NOTE — Progress Notes (Signed)
HANDSOME, GRISSETT ([id number]) Visit Report for 04/09/2015 Arrival Information Details Patient Name: [name], [name] Date of Service: 04/09/2015 3:45 PM Medical Record Number: [id number] Patient Account Number: [id number] Date of Birth/Sex: 1936-01-26 (79 y.o. Male) Treating RN: Curtis Sites Primary Care Physician: Dorothey Baseman Other Clinician: Referring Physician: Terance Hart, DAVID Treating Physician/Extender: Rudene Re in Treatment: 12 Visit Information History Since Last Visit Added or deleted any medications: No Patient Arrived: Wheel Chair Any new allergies or adverse reactions: No Arrival Time: 15:48 Had a fall or experienced change in No activities of daily living that may affect Accompanied By: self risk of falls: Transfer Assistance: None Signs or symptoms of abuse/neglect since last No Patient Identification Verified: Yes visito Secondary Verification Process Yes Hospitalized since last visit: No Completed: Pain Present Now: No Patient Requires Transmission-Based No Precautions: Patient Has Alerts: No Electronic Signature(s) Signed: 04/09/2015 5:04:53 PM By: Curtis Sites Entered By: Curtis Sites on 04/09/2015 15:49:04 [name] ([id number]) -------------------------------------------------------------------------------- Encounter Discharge Information Details Patient Name: [name] Date of Service: 04/09/2015 3:45 PM Medical Record Number: [id number] Patient Account Number: [id number] Date of Birth/Sex: [birth year] (79 y.o. Male) Treating RN: Curtis Sites Primary Care Physician: Dorothey Baseman Other Clinician: Referring Physician: Dorothey Baseman Treating Physician/Extender: Rudene Re in Treatment: 12 Encounter Discharge Information Items Discharge Pain Level: 0 Discharge Condition: Stable Ambulatory Status: Wheelchair Discharge Destination: Nursing Home Transportation: Private Auto Accompanied By:  self Schedule Follow-up Appointment: Yes Medication Reconciliation completed and provided to Patient/Care No Vieno Tarrant: Provided on Clinical Summary of Care: 04/09/2015 Form Type Recipient Paper Patient [facility] Electronic Signature(s) Signed: 04/09/2015 5:04:53 PM By: Curtis Sites Previous Signature: 04/09/2015 4:39:43 PM Version By: Gwenlyn Perking Entered By: Curtis Sites on 04/09/2015 16:42:27 [name] ([id number]) -------------------------------------------------------------------------------- Lower Extremity Assessment Details Patient Name: [name] Date of Service: 04/09/2015 3:45 PM Medical Record Number: [id number] Patient Account Number: [id number] Date of Birth/Sex: [birth year] (79 y.o. Male) Treating RN: Curtis Sites Primary Care Physician: Terance Hart, DAVID Other Clinician: Referring Physician: Terance Hart, DAVID Treating Physician/Extender: Rudene Re in Treatment: 12 Edema Assessment Assessed: [Left: No] [Right: No] Edema: [Left: Ye] [Right: s] Calf Left: Right: Point of Measurement: 40 cm From Medial Instep cm 37.8 cm Ankle Left: Right: Point of Measurement: 10 cm From Medial Instep cm 29.2 cm Vascular Assessment Pulses: Posterior Tibial Dorsalis Pedis Palpable: [Right:Yes] Extremity colors, hair growth, and conditions: Extremity Color: [Right:Hyperpigmented] Hair Growth on Extremity: [Right:No] Temperature of Extremity: [Right:Warm] Capillary Refill: [Right:< 3 seconds] Toe Nail Assessment Left: Right: Thick: Yes Discolored: Yes Deformed: Yes Improper Length and Hygiene: Yes Electronic Signature(s) Signed: 04/09/2015 5:04:53 PM By: Curtis Sites Entered By: Curtis Sites on 04/09/2015 15:59:16 [name] ([id number]) -------------------------------------------------------------------------------- Multi Wound Chart Details Patient Name: [name] Date of Service: 04/09/2015 3:45 PM Medical Record Number:  [id number] Patient Account Number: [id number] Date of Birth/Sex: Apr 18, 1936 (79 y.o. Male) Treating RN: Curtis Sites Primary Care Physician: Dorothey Baseman Other Clinician: Referring Physician: Terance Hart, DAVID Treating Physician/Extender: Rudene Re in Treatment: 12 Vital Signs Height(in): 68 Pulse(bpm): 68 Weight(lbs): Blood Pressure 154/57 (mmHg): Body Mass Index(BMI): Temperature(F): 98.3 Respiratory Rate 16 (breaths/min): Photos: [5:No Photos] [N/A:N/A] Wound Location: [5:Right Lower Leg - Medial] [N/A:N/A] Wounding Event: [5:Gradually Appeared] [N/A:N/A] Primary Etiology: [5:Venous Leg Ulcer] [N/A:N/A] Comorbid History: [5:Cataracts, Anemia, Hypertension, Osteoarthritis] [N/A:N/A] Date Acquired: [5:11/13/2014] [N/A:N/A] Weeks of Treatment: [5:12] [N/A:N/A] Wound Status: [5:Open] [N/A:N/A] Measurements L x W x D 9x5.4x0.2 [N/A:N/A] (cm) Area (cm) : [5:38.17] [N/A:N/A] Volume (cm) : [5:7.634] [N/A:N/A] %  Reduction in Area: [5:62.80%] [N/A:N/A] % Reduction in Volume: 75.20% [N/A:N/A] Classification: [5:Full Thickness Without Exposed Support Structures] [N/A:N/A] Exudate Amount: [5:Large] [N/A:N/A] Exudate Type: [5:Purulent] [N/A:N/A] Exudate Color: [5:yellow, brown, green] [N/A:N/A] Foul Odor After [5:Yes] [N/A:N/A] Cleansing: Odor Anticipated Due to No [N/A:N/A] Product Use: Wound Margin: [5:Flat and Intact] [N/A:N/A] Granulation Amount: [5:Large (67-100%)] [N/A:N/A] Granulation Quality: [5:Red, Pink] [N/A:N/A] Necrotic Amount: [5:None Present (0%)] [N/A:N/A] Exposed Structures: Fascia: No N/A N/A Fat: No Tendon: No Muscle: No Joint: No Bone: No Limited to Skin Breakdown Epithelialization: Small (1-33%) N/A N/A Periwound Skin Texture: Edema: No N/A N/A Excoriation: No Induration: No Callus: No Crepitus: No Fluctuance: No Friable: No Rash: No Scarring: No Periwound Skin Maceration: No N/A N/A Moisture: Moist: No Dry/Scaly:  No Periwound Skin Color: Atrophie Blanche: No N/A N/A Cyanosis: No Ecchymosis: No Erythema: No Hemosiderin Staining: No Mottled: No Pallor: No Rubor: No Temperature: No Abnormality N/A N/A Tenderness on No N/A N/A Palpation: Wound Preparation: Ulcer Cleansing: Other: N/A N/A soap and water Topical Anesthetic Applied: Other: lidocaine 4% Treatment Notes Electronic Signature(s) Signed: 04/09/2015 5:04:53 PM By: Curtis Sites Entered By: Curtis Sites on 04/09/2015 16:13:05 REGIS, HINTON ([id number]) -------------------------------------------------------------------------------- Multi-Disciplinary Care Plan Details Patient Name: BAWI, LAKINS. Date of Service: 04/09/2015 3:45 PM Medical Record Number: [id number] Patient Account Number: [id number] Date of Birth/Sex: [birth year] (79 y.o. Male) Treating RN: Curtis Sites Primary Care Physician: Dorothey Baseman Other Clinician: Referring Physician: Dorothey Baseman Treating Physician/Extender: Rudene Re in Treatment: 12 Active Inactive Orientation to the Wound Care Program Nursing Diagnoses: Knowledge deficit related to the wound healing center program Goals: Patient/caregiver will verbalize understanding of the Wound Healing Center Program Date Initiated: 01/15/2015 Goal Status: Active Interventions: Provide education on orientation to the wound center Notes: Venous Leg Ulcer Nursing Diagnoses: Knowledge deficit related to disease process and management Potential for venous Insuffiency (use before diagnosis confirmed) Goals: Patient will maintain optimal edema control Date Initiated: 01/15/2015 Goal Status: Active Patient/caregiver will verbalize understanding of disease process and disease management Date Initiated: 01/15/2015 Goal Status: Active Verify adequate tissue perfusion prior to therapeutic compression application Date Initiated: 01/15/2015 Goal Status: Active Interventions: Assess  peripheral edema status every visit. Compression as ordered Provide education on venous insufficiency DOCK, BACCAM ([id number]) Treatment Activities: Test ordered outside of clinic : 04/09/2015 Therapeutic compression applied : 04/09/2015 Venous Duplex Doppler : 04/09/2015 Notes: Wound/Skin Impairment Nursing Diagnoses: Impaired tissue integrity Knowledge deficit related to ulceration/compromised skin integrity Goals: Patient/caregiver will verbalize understanding of skin care regimen Date Initiated: 01/15/2015 Goal Status: Active Ulcer/skin breakdown will have a volume reduction of 30% by week 4 Date Initiated: 01/15/2015 Goal Status: Active Ulcer/skin breakdown will have a volume reduction of 50% by week 8 Date Initiated: 01/15/2015 Goal Status: Active Ulcer/skin breakdown will have a volume reduction of 80% by week 12 Date Initiated: 01/15/2015 Goal Status: Active Ulcer/skin breakdown will heal within 14 weeks Date Initiated: 01/15/2015 Goal Status: Active Interventions: Assess patient/caregiver ability to perform ulcer/skin care regimen upon admission and as needed Assess ulceration(s) every visit Provide education on ulcer and skin care Notes: Electronic Signature(s) Signed: 04/09/2015 5:04:53 PM By: Curtis Sites Entered By: Curtis Sites on 04/09/2015 16:12:55 [name] ([id number]) -------------------------------------------------------------------------------- Patient/Caregiver Education Details Patient Name: [name] Date of Service: 04/09/2015 3:45 PM Medical Record Number: [id number] Patient Account Number: [id number] Date of Birth/Gender: 1935/10/03 (79 y.o. Male) Treating RN: Curtis Sites Primary Care Physician: Dorothey Baseman Other Clinician: Referring Physician: Dorothey Baseman Treating Physician/Extender: Rudene Re in Treatment:  12 Education Assessment Education Provided To: Patient Education Topics Provided Wound/Skin  Impairment: Handouts: Other: have SNF nurse change wrap if needed for drainage or odor Methods: Explain/Verbal Responses: State content correctly Electronic Signature(s) Signed: 04/09/2015 5:04:53 PM By: Curtis Sites Entered By: Curtis Sites on 04/09/2015 16:42:57 Monterrio, Gerst Chrissie Noa ([id number]) -------------------------------------------------------------------------------- Wound Assessment Details Patient Name: [name] Date of Service: 04/09/2015 3:45 PM Medical Record Number: [id number] Patient Account Number: [id number] Date of Birth/Sex: 08/06/1935 (79 y.o. Male) Treating RN: Curtis Sites Primary Care Physician: Terance Hart, DAVID Other Clinician: Referring Physician: Terance Hart, DAVID Treating Physician/Extender: Rudene Re in Treatment: 12 Wound Status Wound Number: 5 Primary Venous Leg Ulcer Etiology: Wound Location: Right Lower Leg - Medial Wound Status: Open Wounding Event: Gradually Appeared Comorbid Cataracts, Anemia, Hypertension, Date Acquired: 11/13/2014 History: Osteoarthritis Weeks Of Treatment: 12 Clustered Wound: No Photos Photo Uploaded By: Curtis Sites on 04/09/2015 17:04:17 Wound Measurements Length: (cm) 9 Width: (cm) 5.4 Depth: (cm) 0.2 Area: (cm) 38.17 Volume: (cm) 7.634 % Reduction in Area: 62.8% % Reduction in Volume: 75.2% Epithelialization: Small (1-33%) Tunneling: No Undermining: No Wound Description Full Thickness Without Exposed Classification: Support Structures Wound Margin: Flat and Intact Exudate Large Amount: Exudate Type: Purulent Exudate Color: yellow, brown, green Foul Odor After Cleansing: Yes Due to Product Use: No Wound Bed Granulation Amount: Large (67-100%) Exposed Structure Granulation Quality: Red, Pink Fascia Exposed: No Necrotic Amount: None Present (0%) Fat Layer Exposed: No DELRON, COMER ([id number]) Tendon Exposed: No Muscle Exposed: No Joint Exposed: No Bone Exposed:  No Limited to Skin Breakdown Periwound Skin Texture Texture Color No Abnormalities Noted: No No Abnormalities Noted: No Callus: No Atrophie Blanche: No Crepitus: No Cyanosis: No Excoriation: No Ecchymosis: No Fluctuance: No Erythema: No Friable: No Hemosiderin Staining: No Induration: No Mottled: No Localized Edema: No Pallor: No Rash: No Rubor: No Scarring: No Temperature / Pain Moisture Temperature: No Abnormality No Abnormalities Noted: No Dry / Scaly: No Maceration: No Moist: No Wound Preparation Ulcer Cleansing: Other: soap and water, Topical Anesthetic Applied: Other: lidocaine 4%, Treatment Notes Wound #5 (Right, Medial Lower Leg) 1. Cleansed with: Cleanse wound with antibacterial soap and water 2. Anesthetic Topical Lidocaine 4% cream to wound bed prior to debridement 3. Peri-wound Care: Skin Prep 4. Dressing Applied: Mepitel Other dressing (specify in notes) 5. Secondary Dressing Applied ABD Pad Dry Gauze 7. Secured with Henriette Combs to Right Lower Extremity Notes xtrasorb, Apligraf applied by Dr Meyer Russel today in clinic DEONTAY, LADNIER ([id number]) Electronic Signature(s) Signed: 04/09/2015 5:04:53 PM By: Curtis Sites Entered By: Curtis Sites on 04/09/2015 16:05:41 RODOLFO, GASTER ([id number]) -------------------------------------------------------------------------------- Vitals Details Patient Name: ARYAN, SPARKS. Date of Service: 04/09/2015 3:45 PM Medical Record Number: [id number] Patient Account Number: [id number] Date of Birth/Sex: 17-Dec-1935 (79 y.o. Male) Treating RN: Curtis Sites Primary Care Physician: Terance Hart, DAVID Other Clinician: Referring Physician: Terance Hart, DAVID Treating Physician/Extender: Rudene Re in Treatment: 12 Vital Signs Time Taken: 15:51 Temperature (F): 98.3 Height (in): 68 Pulse (bpm): 68 Respiratory Rate (breaths/min): 16 Blood Pressure (mmHg): 154/57 Reference Range: 80 - 120 mg /  dl Electronic Signature(s) Signed: 04/09/2015 5:04:53 PM By: Curtis Sites Entered By: Curtis Sites on 04/09/2015 15:51:24

## 2015-04-10 NOTE — Progress Notes (Signed)
LAVAUGHN, HABERLE ([id number]) Visit Report for 04/09/2015 Chief Complaint Document Details Patient Name: [name], [name] Date of Service: 04/09/2015 3:45 PM Medical Record Number: [id number] Patient Account Number: [id number] Date of Birth/Sex: [birth year] (79 y.o. Male) Treating RN: Curtis Sites Primary Care Physician: Dorothey Baseman Other Clinician: Referring Physician: Dorothey Baseman Treating Physician/Extender: Rudene Re in Treatment: 12 Information Obtained from: Patient Chief Complaint Patient presents for treatment of an open ulcer due to venous insufficiency. The patient has had a open wound on his right medial ankle for at least 3 years and was last seen in the wound clinic in February 2015. He was lost to follow-up after that. Electronic Signature(s) Signed: 04/09/2015 4:59:50 PM By: Evlyn Kanner MD, FACS Entered By: Evlyn Kanner on 04/09/2015 16:59:50 [name], [name] ([id number]) -------------------------------------------------------------------------------- Cellular or Tissue Based Product Details Patient Name: [name] Date of Service: 04/09/2015 3:45 PM Medical Record Number: [id number] Patient Account Number: [id number] Date of Birth/Sex: 26-Mar-1936 (79 y.o. Male) Treating RN: Curtis Sites Primary Care Physician: Dorothey Baseman Other Clinician: Referring Physician: Dorothey Baseman Treating Physician/Extender: Rudene Re in Treatment: 12 Cellular or Tissue Based Wound #5 Right,Medial Lower Leg Product Type Applied to: Performed By: Physician Evlyn Kanner, MD Cellular or Tissue Based Apligraf Product Type: Time-Out Taken: Yes Location: trunk / arms / legs Wound Size (sq cm): 48.6 Product Size (sq cm): 44 Waste Size (sq cm): 0 Amount of Product Applied (sq cm): 44 Lot #: [id number] Order #: [id number] Expiration Date: 04/16/2015 Fenestrated: Yes Instrument: Blade Reconstituted: Yes Solution Type:  nacl Solution Amount: 10 ml Lot #: [id number] Solution Expiration 11/03/2016 Date: Secured: Yes Secured With: Steri-Strips Dressing Applied: Yes Primary Dressing: mepitel Procedural Pain: 0 Post Procedural Pain: 0 Response to Treatment: Procedure was tolerated well Post Procedure Diagnosis Same as Pre-procedure Electronic Signature(s) Signed: 04/09/2015 4:59:43 PM By: Evlyn Kanner MD, FACS Entered By: Evlyn Kanner on 04/09/2015 16:59:43 [name], [name] ([id number]) [name], [name] ([id number]) -------------------------------------------------------------------------------- HPI Details Patient Name: [name], [name] Date of Service: 04/09/2015 3:45 PM Medical Record Number: [id number] Patient Account Number: [id number] Date of Birth/Sex: 02-Jan-1936 (79 y.o. Male) Treating RN: Curtis Sites Primary Care Physician: Dorothey Baseman Other Clinician: Referring Physician: Dorothey Baseman Treating Physician/Extender: Rudene Re in Treatment: 12 History of Present Illness Location: right lower extremity ulceration Quality: Patient reports experiencing a dull pain to affected area(s). Severity: Patient states wound are getting worse. 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 Modifying Factors: Consults to this date include:has received treatment in the wound center in the past but stopped coming here since February 2015 Associated Signs and Symptoms: Patient reports having difficulty standing for long periods. HPI Description: 79 year old male who is known to have progressive weakness and has been in a nursing home for a while has had chronic lower extremity edema both legs and a large open  wound on the right lower extremity which is has at least for about 3-4 years. The patient was seen in the wound clinic before and has been treated until February 2015 when he was lost to follow-up. Past medical history is significant for hypertension, gout, venous stasis ulcers, Parkinson's [name], constipation. He's not been  a diabetic but his last hemoglobin A1c was 6 in March 2015. There is documentation that he's had endovenous ablation of bilateral varicose veins but we have no documentation about this and this may be done at least 4-5 years ago. 01/22/2015 -- he has his vascular test is scheduled for this afternoon. 01/29/2015 -- the patient's vascular test was canceled last week because he was unable to get onto the examining bed at [facility]. His Unna's boot was not applied either and the patient had a dressing placed by home health. Today when his dressing was removed we found a lot of maggots in his right lower extremity. 02/07/2015 -- Was seen in the vascular office by the PA and she noted that a bilateral venous duplex study did not show any DVT, SVT or reflux bilaterally. The patient was advised to continue with Unna's boot and would at some stage to require graduated compression stockings of the 20-30 mmHg variety. In the future lymphedema pumps would also be considered. 02/14/2015 -- his dressing is smelling quite a bit and there is a discoloration of the wound suggestive of an infection. Deep tissue cultures will be taken today. 02/21/2015 -- the culture is back and it has growth moderate growth of Proteus and gram-negative rods sensitive to sulfa, ampicillin, cephazolin, Zosyn. He is already on doxycycline and his wound is looking clean and hence we will continue with this. 02/28/2015 -- he's been doing fine still on his anti-buttocks and has no fresh issues. 03/07/2015 -- I was told that because he lives in a nursing home the Apligraf was denied by his  insurance company. 03/14/2015 -- we are awaiting samples and a trial of Puraply to be tried for his ulceration. 03/21/2015 -- his Apligraf was approved and he is here for his first application of Apligraf 03/28/2015 -- his wound has had quite a bit of secretion and needs to be changed and hence I will use the CHAZZ, PHILSON. ([id number]) second application of Apligraf. 04/04/2015 -- he is here for a wound check but has a lot of secretions and hence his dressing needs to be taken down. 04/09/2015 -- he is here for his third application of Apligraf Electronic Signature(s) Signed: 04/09/2015 5:00:07 PM By: Evlyn Kanner MD, FACS Entered By: Evlyn Kanner on 04/09/2015 17:00:07 [name], [name] ([id number]) -------------------------------------------------------------------------------- Physical Exam Details Patient Name: [name], [name] Date of Service: 04/09/2015 3:45 PM Medical Record Number: [id number] Patient Account Number: [id number] Date of Birth/Sex: 1935-11-22 (79 y.o. Male) Treating RN: Curtis Sites Primary Care Physician: Dorothey Baseman Other Clinician: Referring Physician: Terance Hart, DAVID Treating Physician/Extender: Rudene Re in Treatment: 12 Constitutional . Pulse regular. Respirations normal and unlabored. Afebrile. . Eyes Nonicteric. Reactive to light. Ears, Nose, Mouth, and Throat Lips, teeth, and gums WNL.Marland Kitchen Moist mucosa without lesions . Neck supple and nontender. No palpable supraclavicular or cervical adenopathy. Normal sized without goiter. Respiratory WNL. No retractions.. Cardiovascular Pedal Pulses WNL. No clubbing, cyanosis or edema. Gastrointestinal (GI) Abdomen without masses or tenderness.. No liver or spleen enlargement or tenderness.. Genitourinary (GU) No hydrocele, spermatocele, tenderness of the cord, or testicular mass.Marland Kitchen Penis without lesions.Renetta Chalk without lesions. No cystocele, or rectocele. Pelvic support intact, no  discharge. Marland Kitchen Urethra without masses, tenderness or scarring.Marland Kitchen Lymphatic No adneopathy. No adenopathy. No adenopathy. Musculoskeletal Adexa without tenderness or enlargement.. Digits and nails w/o clubbing, cyanosis, infection, petechiae, ischemia, or inflammatory conditions.. Integumentary (Hair, Skin) No suspicious lesions. No crepitus or fluctuance. No peri-wound warmth or erythema. No masses.Marland Kitchen Psychiatric Judgement  and insight Intact.. No evidence of depression, anxiety, or agitation.. Notes the wound is looking clean today and after washing out with the saline will be ready for the next application of Apligraf Electronic Signature(s) Signed: 04/09/2015 5:00:34 PM By: Evlyn Kanner MD, FACS Entered By: Evlyn Kanner on 04/09/2015 17:00:34 [name], [name] ([id number]) BRAYON, EASTLICK ([id number]) -------------------------------------------------------------------------------- Physician Orders Details Patient Name: [name], [name] Date of Service: 04/09/2015 3:45 PM Medical Record Number: [id number] Patient Account Number: [id number] Date of Birth/Sex: [birth year] (79 y.o. Male) Treating RN: Curtis Sites Primary Care Physician: Dorothey Baseman Other Clinician: Referring Physician: Dorothey Baseman Treating Physician/Extender: Rudene Re in Treatment: 12 Verbal / Phone Orders: Yes Clinician: Curtis Sites Read Back and Verified: Yes Diagnosis Coding Wound Cleansing Wound #5 Right,Medial Lower Leg o Cleanse wound with mild soap and water o May shower with protection. o No tub bath. Skin Barriers/Peri-Wound Care Wound #5 Right,Medial Lower Leg o Barrier cream Primary Wound Dressing Wound #5 Right,Medial Lower Leg o Drawtex o Mepitel One Secondary Dressing Wound #5 Right,Medial Lower Leg o ABD pad o XtraSorb Dressing Change Frequency Wound #5 Right,Medial Lower Leg o Change dressing every week o Other: - as needed for drainage - do  not remove below mepitel and steri strips Follow-up Appointments Wound #5 Right,Medial Lower Leg o Return Appointment in 1 week. Edema Control Wound #5 Right,Medial Lower Leg o Unna Boot to Right Lower Extremity Additional Orders / Instructions Wound #5 Right,Medial Lower Leg [name], [name] ([id number]) o Other: - SNF nurse may change unna wrap as needed for copius drainage or odor. DO NOT REMOVE DRESSING BELOW DRAWTEX. PATIENT HAS SKIN SUBSTITUTE AND IT SHOULD NOT BE REMOVED SNF nurse is to call [facility] prior to changing unna wrap for instruction r/t improper unna wrap Please call us at [phone] to be sure wrap is applied correctly prior to changing wrap. Speak with Selena Batten or Mardene Celeste o Other: - CARBOFLEX FOR ODOR CONTROL Advanced Therapies Wound #5 Right,Medial Lower Leg o Apligraf application in clinic; including contact layer, fixation with steri strips, dry gauze and cover dressing. Electronic Signature(s) Signed: 04/09/2015 5:04:53 PM By: Curtis Sites Signed: 04/09/2015 5:06:09 PM By: Evlyn Kanner MD, FACS Entered By: Curtis Sites on 04/09/2015 16:37:38 Ruble, Wilden Chrissie [name] ([id number]) -------------------------------------------------------------------------------- Problem List Details Patient Name: KRISHAUN, NOOR. Date of Service: 04/09/2015 3:45 PM Medical Record Number: [id number] Patient Account Number: [id number] Date of Birth/Sex: 1936-05-19 (79 y.o. 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 Date of Service: 04/09/2015 3:45 PM Medical Record Number: [id number] Patient Account Number: [id number] Date of Birth/Sex: January 29, 1936 (79 y.o. Male) Treating RN: Curtis Sites Primary Care Physician: Dorothey Baseman Other Clinician: Referring Physician: Dorothey Baseman Treating Physician/Extender: Rudene Re in Treatment: 12 Subjective Chief Complaint Information obtained from Patient Patient presents for treatment of an open ulcer due to venous insufficiency. The patient has had  a open wound on his right medial ankle for at least 3 years and was last seen in the wound clinic in February 2015. He was lost to follow-up after that. History of Present Illness (HPI) The following HPI elements were documented for the patient's wound: Location: right lower extremity ulceration Quality: Patient reports experiencing a dull pain to affected area(s). Severity: Patient states wound are getting worse. 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 Modifying Factors: Consults to this date include:has received treatment in the wound center in the past but stopped coming here since February 2015 Associated Signs and Symptoms: Patient reports having difficulty standing for long  periods. 79 year old male who is known to have progressive weakness and has been in a nursing home for a while has had chronic lower extremity edema both legs and a large open wound on the right lower extremity which is has at least for about 3-4 years. The patient was seen in the wound clinic before and has been treated until February 2015 when he was lost to follow-up. Past medical history is significant for hypertension, gout, venous stasis ulcers, Parkinson's [name], constipation. He's not been a diabetic but his last hemoglobin A1c was 6 in March 2015. There is documentation that he's had endovenous ablation of bilateral varicose veins but we have no documentation about this and this may be done at least 4-5 years ago. 01/22/2015 -- he has his vascular test is scheduled for this afternoon. 01/29/2015 -- the patient's vascular test was canceled last week because he was unable to get onto the examining bed at [facility]. His Unna's boot was not applied either and the patient had a dressing placed by home health. Today when his dressing was removed we found a lot of maggots in his right lower extremity. 02/07/2015 -- Was seen in the vascular office by the PA and she noted that a bilateral venous duplex study did not show any DVT, SVT or reflux bilaterally. The patient was advised to continue with Unna's boot and would at some stage to require graduated compression stockings of the 20-30 mmHg variety. In the future lymphedema pumps would also be considered. 02/14/2015 -- his dressing is smelling quite a bit and there is a discoloration of the wound suggestive of an [name], [name] ([id number]) infection. Deep tissue cultures will be taken today. 02/21/2015 -- the culture is back and it has growth moderate growth of Proteus and gram-negative rods sensitive to sulfa, ampicillin, cephazolin, Zosyn. He is already on doxycycline and his wound is looking clean and hence we will continue with  this. 02/28/2015 -- he's been doing fine still on his anti-buttocks and has no fresh issues. 03/07/2015 -- I was told that because he lives in a nursing home the Apligraf was denied by his insurance company. 03/14/2015 -- we are awaiting samples and a trial of Puraply to be tried for his ulceration. 03/21/2015 -- his Apligraf was approved and he is here for his first application of Apligraf 03/28/2015 -- his wound has had quite a bit of secretion and needs to be changed and hence I will use the second application of Apligraf. 04/04/2015 -- he is here for a wound  check but has a lot of secretions and hence his dressing needs to be taken down. 04/09/2015 -- he is here for his third application of Apligraf Objective Constitutional Pulse regular. Respirations normal and unlabored. Afebrile. Vitals Time Taken: 3:51 PM, Height: 68 in, Temperature: 98.3 F, Pulse: 68 bpm, Respiratory Rate: 16 breaths/min, Blood Pressure: 154/57 mmHg. Eyes Nonicteric. Reactive to light. Ears, Nose, Mouth, and Throat Lips, teeth, and gums WNL.Marland Kitchen Moist mucosa without lesions . Neck supple and nontender. No palpable supraclavicular or cervical adenopathy. Normal sized without goiter. Respiratory WNL. No retractions.. Cardiovascular Pedal Pulses WNL. No clubbing, cyanosis or edema. Gastrointestinal (GI) Abdomen without masses or tenderness.. No liver or spleen enlargement or tenderness.. Genitourinary (GU) No hydrocele, spermatocele, tenderness of the cord, or testicular mass.Marland Kitchen Penis without lesions.Renetta Chalk without lesions. No cystocele, or rectocele. Pelvic support intact, no discharge. Marland Kitchen Urethra without masses, SHUAIB, CORSINO. ([id number]) tenderness or scarring.Marland Kitchen Lymphatic No adneopathy. No adenopathy. No adenopathy. Musculoskeletal Adexa without tenderness or enlargement.. Digits and nails w/o clubbing, cyanosis, infection, petechiae, ischemia, or inflammatory conditions.Marland Kitchen Psychiatric Judgement  and insight Intact.. No evidence of depression, anxiety, or agitation.. General Notes: the wound is looking clean today and after washing out with the saline will be ready for the next application of Apligraf Integumentary (Hair, Skin) No suspicious lesions. No crepitus or fluctuance. No peri-wound warmth or erythema. No masses.. Wound #5 status is Open. Original cause of wound was Gradually Appeared. The wound is located on the Right,Medial Lower Leg. The wound measures 9cm length x 5.4cm width x 0.2cm depth; 38.17cm^2 area and 7.634cm^3 volume. The wound is limited to skin breakdown. There is no tunneling or undermining noted. There is a large amount of purulent drainage noted. The wound margin is flat and intact. There is large (67-100%) red, pink granulation within the wound bed. There is no necrotic tissue within the wound bed. The periwound skin appearance did not exhibit: Callus, Crepitus, Excoriation, Fluctuance, Friable, Induration, Localized Edema, Rash, Scarring, Dry/Scaly, Maceration, Moist, Atrophie Blanche, Cyanosis, Ecchymosis, Hemosiderin Staining, Mottled, Pallor, Rubor, Erythema. Periwound temperature was noted as No Abnormality. 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 DONIS, PINDER ([id number]) Procedures Wound #5 Wound #5 is a Venous Leg Ulcer located on the Right,Medial Lower Leg. A skin graft procedure using a bioengineered skin substitute/cellular or tissue based product was performed by Evlyn Kanner, MD. Apligraf was applied and  secured with Steri-Strips. 44 sq cm of product was utilized and 0 sq cm was wasted. Post Application, mepitel was applied. A Time Out was conducted prior to the start of the procedure. The procedure was tolerated well with a pain level of 0 throughout and a pain level of 0 following the procedure. Post procedure Diagnosis Wound #5: Same as Pre-Procedure . Plan Wound Cleansing: Wound #5 Right,Medial Lower Leg: Cleanse wound with mild soap and water May shower with protection. No tub bath. Skin Barriers/Peri-Wound Care: Wound #5 Right,Medial Lower Leg: Barrier cream Primary Wound Dressing: Wound #5 Right,Medial Lower Leg: Drawtex Mepitel One Secondary Dressing: Wound #5 Right,Medial Lower Leg: ABD pad XtraSorb Dressing Change Frequency: Wound #5 Right,Medial Lower Leg: Change  dressing every week Other: - as needed for drainage - do not remove below mepitel and steri strips Follow-up Appointments: Wound #5 Right,Medial Lower Leg: Return Appointment in 1 week. Edema Control: Wound #5 Right,Medial Lower Leg: Unna Boot to Right Lower Extremity Additional Orders / Instructions: Wound #5 Right,Medial Lower Leg: Other: - SNF nurse may change unna wrap as needed for copius drainage or odor. DO NOT REMOVE DRESSING BELOW DRAWTEX. PATIENT HAS SKIN SUBSTITUTE AND IT SHOULD NOT BE REMOVED SNF nurse is to call [facility] prior to changing unna wrap for instruction r/t improper unna wrap Please call us at [phone] to be sure wrap is applied correctly prior to changing LAUREN, AGUAYO. ([id number]) wrap. Speak with Selena Batten or Mardene Celeste Other: - CARBOFLEX FOR ODOR CONTROL Advanced Therapies: Wound #5 Right,Medial Lower Leg: Apligraf application in clinic; including contact layer, fixation with steri strips, dry gauze and cover dressing. He will be offered a wound check next week. Electronic Signature(s) Signed: 04/09/2015 5:01:41 PM By: Evlyn Kanner MD, FACS Entered By: Evlyn Kanner on 04/09/2015 17:01:40 NICKALOUS, STINGLEY ([id number]) -------------------------------------------------------------------------------- SuperBill Details Patient Name: DEIGO, ALONSO. Date of Service: 04/09/2015 Medical Record Number: [id number] Patient Account Number: [id number] Date of Birth/Sex: 07-29-1935 (79 y.o. 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 E66.01 Morbid (severe) obesity due to excess calories I89.0 Lymphedema, not elsewhere classified Facility Procedures CPT4 Code Description: 44010272 (Facility Use Only) Apligraf 1 SQ CM Modifier: Quantity: 44 CPT4 Code Description: 53664403 15271 - SKIN SUB GRAFT TRNK/ARM/LEG ICD-10 Description Diagnosis I87.311 Chronic venous hypertension (idiopathic) with ulcer of 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 E66.01 Morbid (severe) obesity  due to excess calories I89.0 Lymphedema, not elsewhere classified Modifier: right lower s of muscle Quantity: 1 extremity CPT4 Code Description: [id number] 15272 - SKIN SUB GRAFT T/A/L ADD-ON ICD-10 Description Diagnosis I87.311 Chronic venous hypertension (idiopathic) with ulcer of 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 E66.01 Morbid (severe) obesity  due to excess calories I89.0 Lymphedema, not elsewhere classified Modifier: right lower s of muscle Quantity: 1 extremity Physician Procedures CPT4 Code Description: [id number] 15271 - WC PHYS SKIN SUB GRAFT TRNK/ARM/LEG ICD-10 Description Diagnosis I87.311 Chronic venous hypertension (idiopathic) with ulcer of L97.313 Non-pressure chronic ulcer of  right ankle with necrosis MASIYAH, ENGEN  ([id number]) Modifier: right lower of muscle Quantity: 1 extremity Electronic Signature(s) Signed: 04/09/2015 5:01:59 PM By: Evlyn Kanner MD, FACS Entered By: Evlyn Kanner on 04/09/2015 17:01:59

## 2015-04-11 ENCOUNTER — Ambulatory Visit: Payer: Medicare Other | Admitting: General Surgery

## 2015-04-18 ENCOUNTER — Encounter: Payer: Medicare Other | Admitting: Surgery

## 2015-04-18 DIAGNOSIS — L97313 Non-pressure chronic ulcer of right ankle with necrosis of muscle: Secondary | ICD-10-CM | POA: Diagnosis not present

## 2015-04-18 NOTE — Progress Notes (Signed)
QUAME, SPRATLIN ([id number]) Visit Report for 04/18/2015 Arrival Information Details Patient Name: [name], [name] Date of Service: 04/18/2015 2:30 PM Medical Record Number: [id number] Patient Account Number: [id number] Date of Birth/Sex: 06-May-1936 (79 y.o. Male) Treating RN: Clover Mealy, RN, BSN, [place] Sink Primary Care Physician: Terance Hart, DAVID Other Clinician: Referring Physician: Terance Hart, DAVID Treating Physician/Extender: Rudene Re in Treatment: 13 Visit Information History Since Last Visit Added or deleted any medications: No Patient Arrived: Wheel Chair Any new allergies or adverse reactions: No Arrival Time: 14:25 Had a fall or experienced change in No activities of daily living that may affect Accompanied By: sef risk of falls: Transfer Assistance: None Signs or symptoms of abuse/neglect since last No Patient Identification Verified: Yes visito Secondary Verification Process Yes Has Dressing in Place as Prescribed: Yes Completed: Has Compression in Place as Prescribed: Yes Patient Requires Transmission-Based No Pain Present Now: No Precautions: Patient Has Alerts: No Electronic Signature(s) Signed: 04/18/2015 2:25:29 PM By: Elpidio Eric BSN, RN Entered By: Elpidio Eric on 04/18/2015 14:25:29 [name] ([id number]) -------------------------------------------------------------------------------- Encounter Discharge Information Details Patient Name: [name] Date of Service: 04/18/2015 2:30 PM Medical Record Number: [id number] Patient Account Number: [id number] Date of Birth/Sex: 09-07-1935 (79 y.o. Male) Treating RN: Clover Mealy, RN, BSN, [place] Sink Primary Care Physician: Terance Hart, DAVID Other Clinician: Referring Physician: Terance Hart, DAVID Treating Physician/Extender: Rudene Re in Treatment: 13 Encounter Discharge Information Items Discharge Pain Level: 0 Discharge Condition: Stable Ambulatory Status: Wheelchair Discharge Destination:  Nursing Home Transportation: Other Schedule Follow-up Appointment: No Medication Reconciliation completed and provided to Patient/Care No Thressa Shiffer: Provided on Clinical Summary of Care: 04/18/2015 Form Type Recipient Paper Patient [facility] Electronic Signature(s) Signed: 04/18/2015 2:52:24 PM By: Elpidio Eric BSN, RN Previous Signature: 04/18/2015 2:51:04 PM Version By: Francie Massing Entered By: Elpidio Eric on 04/18/2015 14:52:24 [name] ([id number]) -------------------------------------------------------------------------------- Lower Extremity Assessment Details Patient Name: [name] Date of Service: 04/18/2015 2:30 PM Medical Record Number: [id number] Patient Account Number: [id number] Date of Birth/Sex: 10/22/1935 (79 y.o. Male) Treating RN: Clover Mealy, RN, BSN, [place] Sink Primary Care Physician: Terance Hart, DAVID Other Clinician: Referring Physician: Terance Hart, DAVID Treating Physician/Extender: Rudene Re in Treatment: 13 Edema Assessment Assessed: [Left: No] [Right: No] Edema: [Left: Ye] [Right: s] Calf Left: Right: Point of Measurement: 40 cm From Medial Instep cm 37.8 cm Ankle Left: Right: Point of Measurement: cm From Medial Instep cm 29.2 cm Vascular Assessment Pulses: Posterior Tibial Dorsalis Pedis Palpable: [Right:Yes] Extremity colors, hair growth, and conditions: Extremity Color: [Right:Hyperpigmented] Hair Growth on Extremity: [Right:No] Temperature of Extremity: [Right:Warm] Capillary Refill: [Right:< 3 seconds] Toe Nail Assessment Left: Right: Thick: Yes Discolored: Yes Deformed: Yes Improper Length and Hygiene: Yes Electronic Signature(s) Signed: 04/18/2015 2:39:45 PM By: Elpidio Eric BSN, RN Entered By: Elpidio Eric on 04/18/2015 14:39:45 [name] ([id number]) -------------------------------------------------------------------------------- Multi Wound Chart Details Patient Name: [name] Date of Service:  04/18/2015 2:30 PM Medical Record Number: [id number] Patient Account Number: [id number] Date of Birth/Sex: 01/13/1936 (79 y.o. Male) Treating RN: Clover Mealy, RN, BSN, [place] Sink Primary Care Physician: Terance Hart, DAVID Other Clinician: Referring Physician: Terance Hart, DAVID Treating Physician/Extender: Rudene Re in Treatment: 13 Vital Signs Height(in): 68 Pulse(bpm): 70 Weight(lbs): Blood Pressure 152/57 (mmHg): Body Mass Index(BMI): Temperature(F): 98.1 Respiratory Rate 16 (breaths/min): Photos: [5:No Photos] [N/A:N/A] Wound Location: [5:Right Lower Leg - Medial] [N/A:N/A] Wounding Event: [5:Gradually Appeared] [N/A:N/A] Primary Etiology: [5:Venous Leg Ulcer] [N/A:N/A] Comorbid History: [5:Cataracts, Anemia, Hypertension, Osteoarthritis] [N/A:N/A] Date Acquired: [5:11/13/2014] [N/A:N/A] Weeks of Treatment: [5:13] [N/A:N/A] Wound Status: [5:Open] [N/A:N/A] Measurements L  x W x D 10x5.4x0.2 [N/A:N/A] (cm) Area (cm) : [5:42.412] [N/A:N/A] Volume (cm) : [5:8.482] [N/A:N/A] % Reduction in Area: [5:58.60%] [N/A:N/A] % Reduction in Volume: 72.40% [N/A:N/A] Classification: [5:Full Thickness Without Exposed Support Structures] [N/A:N/A] Exudate Amount: [5:Large] [N/A:N/A] Exudate Type: [5:Purulent] [N/A:N/A] Exudate Color: [5:yellow, brown, green] [N/A:N/A] Foul Odor After [5:Yes] [N/A:N/A] Cleansing: Odor Anticipated Due to No [N/A:N/A] Product Use: Wound Margin: [5:Flat and Intact] [N/A:N/A] Granulation Amount: [5:Large (67-100%)] [N/A:N/A] Granulation Quality: [5:Red, Pink] [N/A:N/A] Necrotic Amount: [5:None Present (0%)] [N/A:N/A] Exposed Structures: Fascia: No N/A N/A Fat: No Tendon: No Muscle: No Joint: No Bone: No Limited to Skin Breakdown Epithelialization: Small (1-33%) N/A N/A Periwound Skin Texture: Edema: No N/A N/A Excoriation: No Induration: No Callus: No Crepitus: No Fluctuance: No Friable: No Rash: No Scarring: No Periwound Skin Maceration:  Yes N/A N/A Moisture: Moist: Yes Dry/Scaly: No Periwound Skin Color: Atrophie Blanche: No N/A N/A Cyanosis: No Ecchymosis: No Erythema: No Hemosiderin Staining: No Mottled: No Pallor: No Rubor: No Temperature: No Abnormality N/A N/A Tenderness on No N/A N/A Palpation: Wound Preparation: Ulcer Cleansing: Other: N/A N/A soap and water Topical Anesthetic Applied: Other: lidocaine 4% Treatment Notes Electronic Signature(s) Signed: 04/18/2015 2:32:24 PM By: Elpidio Eric BSN, RN Entered By: Elpidio Eric on 04/18/2015 14:32:23 GREYDEN, BESECKER ([id number]) -------------------------------------------------------------------------------- Multi-Disciplinary Care Plan Details Patient Name: JORDYN, DOANE. Date of Service: 04/18/2015 2:30 PM Medical Record Number: [id number] Patient Account Number: [id number] Date of Birth/Sex: April 15, 1936 (79 y.o. Male) Treating RN: Clover Mealy, RN, BSN, [place] Sink Primary Care Physician: Terance Hart, DAVID Other Clinician: Referring Physician: Terance Hart, DAVID Treating Physician/Extender: Rudene Re in Treatment: 51 Active Inactive Orientation to the Wound Care Program Nursing Diagnoses: Knowledge deficit related to the wound healing center program Goals: Patient/caregiver will verbalize understanding of the Wound Healing Center Program Date Initiated: 01/15/2015 Goal Status: Active Interventions: Provide education on orientation to the wound center Notes: Venous Leg Ulcer Nursing Diagnoses: Knowledge deficit related to disease process and management Potential for venous Insuffiency (use before diagnosis confirmed) Goals: Patient will maintain optimal edema control Date Initiated: 01/15/2015 Goal Status: Active Patient/caregiver will verbalize understanding of disease process and disease management Date Initiated: 01/15/2015 Goal Status: Active Verify adequate tissue perfusion prior to therapeutic compression application Date Initiated:  01/15/2015 Goal Status: Active Interventions: Assess peripheral edema status every visit. Compression as ordered Provide education on venous insufficiency CEDAR, DITULLIO ([id number]) Treatment Activities: Test ordered outside of clinic : 04/18/2015 Therapeutic compression applied : 04/18/2015 Venous Duplex Doppler : 04/18/2015 Notes: Wound/Skin Impairment Nursing Diagnoses: Impaired tissue integrity Knowledge deficit related to ulceration/compromised skin integrity Goals: Patient/caregiver will verbalize understanding of skin care regimen Date Initiated: 01/15/2015 Goal Status: Active Ulcer/skin breakdown will have a volume reduction of 30% by week 4 Date Initiated: 01/15/2015 Goal Status: Active Ulcer/skin breakdown will have a volume reduction of 50% by week 8 Date Initiated: 01/15/2015 Goal Status: Active Ulcer/skin breakdown will have a volume reduction of 80% by week 12 Date Initiated: 01/15/2015 Goal Status: Active Ulcer/skin breakdown will heal within 14 weeks Date Initiated: 01/15/2015 Goal Status: Active Interventions: Assess patient/caregiver ability to perform ulcer/skin care regimen upon admission and as needed Assess ulceration(s) every visit Provide education on ulcer and skin care Notes: Electronic Signature(s) Signed: 04/18/2015 2:32:15 PM By: Elpidio Eric BSN, RN Entered By: Elpidio Eric on 04/18/2015 14:32:14 [name] ([id number]) -------------------------------------------------------------------------------- Pain Assessment Details Patient Name: [name] Date of Service: 04/18/2015 2:30 PM Medical Record Number: [id number] Patient Account Number: [id number] Date of Birth/Sex: 07-18-1935 (79  y.o. Male) Treating RN: Afful, RN, BSN, [place] Sink Primary Care Physician: Dorothey Baseman Other Clinician: Referring Physician: Dorothey Baseman Treating Physician/Extender: Rudene Re in Treatment: 13 Active Problems Location of Pain  Severity and Description of Pain Patient Has Paino No Site Locations Pain Management and Medication Current Pain Management: Electronic Signature(s) Signed: 04/18/2015 2:25:45 PM By: Elpidio Eric BSN, RN Entered By: Elpidio Eric on 04/18/2015 14:25:44 [name] ([id number]) -------------------------------------------------------------------------------- Patient/Caregiver Education Details Patient Name: [name] Date of Service: 04/18/2015 2:30 PM Medical Record Number: [id number] Patient Account Number: [id number] Date of Birth/Gender: [birth year] (79 y.o. Male) Treating RN: Clover Mealy, RN, BSN, [place] Sink Primary Care Physician: Terance Hart, DAVID Other Clinician: Referring Physician: Terance Hart DAVID Treating Physician/Extender: Rudene Re in Treatment: 13 Education Assessment Education Provided To: Patient Education Topics Provided Venous: Methods: Explain/Verbal Responses: State content correctly Welcome To The Wound Care Center: Methods: Explain/Verbal Responses: State content correctly Wound/Skin Impairment: Methods: Explain/Verbal Responses: State content correctly Electronic Signature(s) Signed: 04/18/2015 2:52:42 PM By: Elpidio Eric BSN, RN Entered By: Elpidio Eric on 04/18/2015 14:52:42 [name] ([id number]) -------------------------------------------------------------------------------- Wound Assessment Details Patient Name: [name] Date of Service: 04/18/2015 2:30 PM Medical Record Number: [id number] Patient Account Number: [id number] Date of Birth/Sex: 27-Sep-1935 (79 y.o. Male) Treating RN: Clover Mealy, RN, BSN, [place] Primary Care Physician: Terance Hart, DAVID Other Clinician: Referring Physician: Terance Hart, DAVID Treating Physician/Extender: Rudene Re in Treatment: 13 Wound Status Wound Number: 5 Primary Venous Leg Ulcer Etiology: Wound Location: Right Lower Leg - Medial Wound Status: Open Wounding Event: Gradually  Appeared Comorbid Cataracts, Anemia, Hypertension, Date Acquired: 11/13/2014 History: Osteoarthritis Weeks Of Treatment: 13 Clustered Wound: No Photos Photo Uploaded By: Elpidio Eric on 04/18/2015 16:11:07 Wound Measurements Length: (cm) 10 Width: (cm) 5.4 Depth: (cm) 0.2 Area: (cm) 42.412 Volume: (cm) 8.482 % Reduction in Area: 58.6% % Reduction in Volume: 72.4% Epithelialization: Small (1-33%) Tunneling: No Undermining: No Wound Description Full Thickness Without Exposed Classification: Support Structures Wound Margin: Flat and Intact Exudate Large Amount: Exudate Type: Purulent Exudate Color: yellow, brown, green Foul Odor After Cleansing: Yes Due to Product Use: No Wound Bed Granulation Amount: Large (67-100%) Exposed Structure Granulation Quality: Red, Pink Fascia Exposed: No Necrotic Amount: None Present (0%) Fat Layer Exposed: No AWS, MALLARE ([id number]) Tendon Exposed: No Muscle Exposed: No Joint Exposed: No Bone Exposed: No Limited to Skin Breakdown Periwound Skin Texture Texture Color No Abnormalities Noted: No No Abnormalities Noted: No Callus: No Atrophie Blanche: No Crepitus: No Cyanosis: No Excoriation: No Ecchymosis: No Fluctuance: No Erythema: No Friable: No Hemosiderin Staining: No Induration: No Mottled: No Localized Edema: No Pallor: No Rash: No Rubor: No Scarring: No Temperature / Pain Moisture Temperature: No Abnormality No Abnormalities Noted: No Dry / Scaly: No Maceration: Yes Moist: Yes Wound Preparation Ulcer Cleansing: Other: soap and water, Topical Anesthetic Applied: Other: lidocaine 4%, Treatment Notes Wound #5 (Right, Medial Lower Leg) 1. Cleansed with: Cleanse wound with antibacterial soap and water 3. Peri-wound Care: Barrier cream 4. Dressing Applied: Other dressing (specify in notes) 5. Secondary Dressing Applied ABD Pad 7. Secured with 4-Layer Compression System - Right Lower  Extremity Notes Sorbact, drawtex Electronic Signature(s) Signed: 04/18/2015 2:29:49 PM By: Elpidio Eric BSN, RN Entered By: Elpidio Eric on 04/18/2015 14:29:49 JAMERE, OSHER ([id number]) LADARIS, BESSE ([id number]) -------------------------------------------------------------------------------- Vitals Details Patient Name: DAGEM, LAZER. Date of Service: 04/18/2015 2:30 PM Medical Record Number: [id number] Patient Account Number: [id number] Date of Birth/Sex: [birth year] (79 y.o. Male) Treating RN: Clover Mealy, RN, BSN, [employer]  Care Physician: Terance Hart, DAVID Other Clinician: Referring Physician: Dorothey Baseman Treating Physician/Extender: Rudene Re in Treatment: 13 Vital Signs Time Taken: 14:27 Temperature (F): 98.1 Height (in): 68 Pulse (bpm): 70 Respiratory Rate (breaths/min): 16 Blood Pressure (mmHg): 152/57 Reference Range: 80 - 120 mg / dl Electronic Signature(s) Signed: 04/18/2015 2:27:23 PM By: Elpidio Eric BSN, RN Entered By: Elpidio Eric on 04/18/2015 14:27:23

## 2015-04-18 NOTE — Progress Notes (Signed)
CASSIDY, DIEDRICH ([id number]) Visit Report for 04/18/2015 Chief Complaint Document Details Patient Name: JOSHAU, KISHBAUGH. Date of Service: 04/18/2015 2:30 PM Medical Record Number: [id number] Patient Account Number: [id number] Date of Birth/Sex: Mar 31, 1936 (78 y.o. Male) Treating RN: Clover Mealy, RN, BSN, [place] Sink Primary Care Physician: Terance Hart, DAVID Other Clinician: Referring Physician: Dorothey Baseman Treating Physician/Extender: Rudene Re in Treatment: 13 Information Obtained from: Patient Chief Complaint Patient presents for treatment of an open ulcer due to venous insufficiency. The patient has had a open wound on his right medial ankle for at least 3 years and was last seen in the wound clinic in February 2015. He was lost to follow-up after that. Electronic Signature(s) Signed: 04/18/2015 2:35:43 PM By: Evlyn Kanner MD, FACS Entered By: Evlyn Kanner on 04/18/2015 14:35:42 Jhovanny, Welcome Chrissie Noa ([id number]) -------------------------------------------------------------------------------- HPI Details Patient Name: PHEONIX, OBAS. Date of Service: 04/18/2015 2:30 PM Medical Record Number: [id number] Patient Account Number: [id number] Date of Birth/Sex: June 30, 1936 (79 y.o. Male) Treating RN: Clover Mealy, RN, BSN, [place] Sink Primary Care Physician: Terance Hart, DAVID Other Clinician: Referring Physician: Terance Hart, DAVID Treating Physician/Extender: Rudene Re in Treatment: 13 History of Present Illness Location: right lower extremity ulceration Quality: Patient reports experiencing a dull pain to affected area(s). Severity: Patient states wound are getting worse. 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 Modifying Factors: Consults to this date include:has received treatment in the wound center in the past but stopped coming here since February 2015 Associated Signs and Symptoms: Patient reports having difficulty standing for long periods. HPI Description: 79 year old male who is known to have progressive weakness and has been in a nursing home for a while has had chronic lower extremity edema both legs and a large open wound on the right lower extremity which is has at least for about 3-4 years. The patient was seen in the wound clinic before and has been treated until February 2015 when he was lost to follow-up. Past medical history is significant for hypertension, gout, venous stasis ulcers, Parkinson's [name], constipation. He's not been a diabetic but his last hemoglobin A1c was 6 in March 2015. There is documentation that he's had endovenous ablation of bilateral varicose veins but we have no documentation about this and this may be done at least 4-5 years ago. 01/22/2015 -- he has his vascular test is scheduled for this afternoon. 01/29/2015 -- the patient's vascular test was canceled last week because he was unable to get onto the examining bed at [facility]. His Unna's boot was not applied either and the patient had a dressing placed by home health. Today when his dressing was removed we found a lot of maggots in his right lower extremity. 02/07/2015 -- Was seen in the vascular office by the PA and she noted that a bilateral venous duplex study did not show any DVT, SVT or reflux bilaterally. The patient was advised to continue with Unna's boot and would at some stage to require graduated compression stockings of the 20-30 mmHg variety. In the future lymphedema pumps would also be considered. 02/14/2015 -- his dressing is smelling quite a bit and there is a discoloration of the wound suggestive of an infection. Deep tissue cultures will  be taken today. 02/21/2015 --  the culture is back and it has growth moderate growth of Proteus and gram-negative rods sensitive to sulfa, ampicillin, cephazolin, Zosyn. He is already on doxycycline and his wound is looking clean and hence we will continue with this. 02/28/2015 -- he's been doing fine still on his anti-buttocks and has no fresh issues. 03/07/2015 -- I was told that because he lives in a nursing home the Apligraf was denied by his insurance company. 03/14/2015 -- we are awaiting samples and a trial of Puraply to be tried for his ulceration. 03/21/2015 -- his Apligraf was approved and he is here for his first application of Apligraf 03/28/2015 -- his wound has had quite a bit of secretion and needs to be changed and hence I will use the JOVANIE, VERGE. ([id number]) second application of Apligraf. 04/04/2015 -- he is here for a wound check but has a lot of secretions and hence his dressing needs to be taken down. 04/09/2015 -- he is here for his third application of Apligraf 04/18/2015 -- he is here for a wound check and though he does not have any overt infection he always has a foul odor to his leg. Electronic Signature(s) Signed: 04/18/2015 2:36:11 PM By: Evlyn Kanner MD, FACS Entered By: Evlyn Kanner on 04/18/2015 14:36:10 GLENDON, DUNWOODY ([id number]) -------------------------------------------------------------------------------- Physical Exam Details Patient Name: JAWAUN, CELMER. Date of Service: 04/18/2015 2:30 PM Medical Record Number: [id number] Patient Account Number: [id number] Date of Birth/Sex: [birth year] (79 y.o. Male) Treating RN: Clover Mealy, RN, BSN, [place] Sink Primary Care Physician: Terance Hart, DAVID Other Clinician: Referring Physician: Terance Hart, DAVID Treating Physician/Extender: Rudene Re in Treatment: 13 Constitutional . Pulse regular. Respirations normal and unlabored. Afebrile. . Eyes Nonicteric. Reactive to light. Ears, Nose, Mouth, and Throat Lips, teeth, and gums  WNL.Marland Kitchen Moist mucosa without lesions . Neck supple and nontender. No palpable supraclavicular or cervical adenopathy. Normal sized without goiter. Respiratory WNL. No retractions.. Cardiovascular Pedal Pulses WNL. No clubbing, cyanosis or edema. Lymphatic No adneopathy. No adenopathy. No adenopathy. Musculoskeletal Adexa without tenderness or enlargement.. Digits and nails w/o clubbing, cyanosis, infection, petechiae, ischemia, or inflammatory conditions.. Integumentary (Hair, Skin) No suspicious lesions. No crepitus or fluctuance. No peri-wound warmth or erythema. No masses.Marland Kitchen Psychiatric Judgement and insight Intact.. No evidence of depression, anxiety, or agitation.. Notes his leg has a foul odor but after washing it out well it looks fairly clean and does not look infected. Electronic Signature(s) Signed: 04/18/2015 2:36:42 PM By: Evlyn Kanner MD, FACS Entered By: Evlyn Kanner on 04/18/2015 14:36:41 DAMEER, SPEISER ([id number]) -------------------------------------------------------------------------------- Physician Orders Details Patient Name: CHRISTIA, COAXUM. Date of Service: 04/18/2015 2:30 PM Medical Record Number: [id number] Patient Account Number: [id number] Date of Birth/Sex: Oct 07, 1935 (79 y.o. Male) Treating RN: Clover Mealy, RN, BSN,  Sink Primary Care Physician: Terance Hart, DAVID Other Clinician: Referring Physician: Terance Hart, DAVID Treating Physician/Extender: Rudene Re in Treatment: 81 Verbal / Phone Orders: Yes Clinician: Afful, RN, BSN, Rita Read Back and Verified: Yes Diagnosis Coding Wound Cleansing Wound #5 Right,Medial Lower Leg o Cleanse wound with mild soap and water Skin Barriers/Peri-Wound Care Wound #5 Right,Medial Lower Leg o Barrier cream Primary Wound Dressing Wound #5 Right,Medial Lower Leg o Other: - Sorbact Secondary Dressing Wound #5 Right,Medial Lower Leg o Drawtex Dressing Change Frequency Wound #5 Right,Medial Lower  Leg o Change dressing every week Follow-up Appointments Wound #5 Right,Medial Lower Leg o Return Appointment in 1 week. Edema Control Wound #5 Right,Medial Lower Leg o 4-Layer Compression System - Right Lower Extremity -  profore Medications-please add to medication list. Wound #5 Right,Medial Lower Leg o P.O. Antibiotics Electronic Signature(s) ECTOR, LAUREL ([id number]) Signed: 04/18/2015 2:35:28 PM By: Elpidio Eric BSN, RN Signed: 04/18/2015 3:40:39 PM By: Evlyn Kanner MD, FACS Entered By: Elpidio Eric on 04/18/2015 14:35:28 SALIL, RAINERI ([id number]) -------------------------------------------------------------------------------- Problem List Details Patient Name: ZEEV, DEAKINS. Date of Service: 04/18/2015 2:30 PM Medical Record Number: [id number] Patient Account Number: [id number] Date of Birth/Sex: 01-Sep-1935 (79 y.o. 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 Date of Service: 04/18/2015 2:30 PM Medical Record Number: [id number] Patient Account Number: [id number] Date of Birth/Sex:  [birth year] (79 y.o. Male) Treating RN: Clover Mealy, RN, BSN, [place] Sink Primary Care Physician: Terance Hart, DAVID Other Clinician: Referring Physician: Dorothey Baseman Treating Physician/Extender: Rudene Re in Treatment: 13 Subjective Chief Complaint Information obtained from Patient Patient presents for treatment of an open ulcer due to venous insufficiency. The patient has had a open wound on his right medial ankle for at least 3 years and was last seen in the wound clinic in February 2015. He was lost to follow-up after that. History of Present Illness (HPI) The following HPI elements were documented for the patient's wound: Location: right lower extremity ulceration Quality: Patient reports experiencing a dull pain to affected area(s). Severity: Patient states wound are getting worse. 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 Modifying Factors: Consults to this date include:has received treatment in the wound center in the past but stopped coming here since February 2015 Associated Signs and Symptoms: Patient reports having difficulty standing for long periods. 79 year old male who is known to have progressive weakness and has been in a nursing home for a while has had chronic lower extremity edema both legs and a large open wound on the right lower extremity which is has at least for about 3-4 years. The patient was seen in the wound clinic before and has been treated until February 2015 when he was lost to follow-up. Past medical history is significant for hypertension, gout, venous stasis ulcers, Parkinson's [name], constipation. He's not been a diabetic but his last hemoglobin A1c was 6  in March 2015. There is documentation that he's had endovenous ablation of bilateral varicose veins but we have no documentation about this and this may be done at least 4-5 years ago. 01/22/2015 -- he has his vascular test is scheduled for this afternoon. 01/29/2015 -- the patient's vascular test was canceled last week because he was unable to get onto the examining bed at [facility]. His Unna's boot was not applied either and the patient had a  dressing placed by home health. Today when his dressing was removed we found a lot of maggots in his right lower extremity. 02/07/2015 -- Was seen in the vascular office by the PA and she noted that a bilateral venous duplex study did not show any DVT, SVT or reflux bilaterally. The patient was advised to continue with Unna's boot and would at some stage to require graduated compression stockings of the 20-30 mmHg variety. In the future lymphedema pumps would also be considered. 02/14/2015 -- his dressing is smelling quite a bit and there is a discoloration of the wound suggestive of an DARRYLL, RAJU. ([id number]) infection. Deep tissue cultures will be taken today. 02/21/2015 -- the culture is back and it has growth moderate growth of Proteus and gram-negative rods sensitive to sulfa, ampicillin, cephazolin, Zosyn. He is already on doxycycline and his wound is looking clean and hence we will continue with this. 02/28/2015 -- he's been doing fine still on his anti-buttocks and has no fresh issues. 03/07/2015 -- I was told that because he lives in a nursing home the Apligraf was denied by his insurance company. 03/14/2015 -- we are awaiting samples and a trial of Puraply to be tried for his ulceration. 03/21/2015 -- his Apligraf was approved and he is here for his first application of Apligraf 03/28/2015 -- his wound has had quite a bit of secretion and needs to be changed and hence I will use the second application of Apligraf. 04/04/2015 -- he is here  for a wound check but has a lot of secretions and hence his dressing needs to be taken down. 04/09/2015 -- he is here for his third application of Apligraf 04/18/2015 -- he is here for a wound check and though he does not have any overt infection he always has a foul odor to his leg. Objective Constitutional Pulse regular. Respirations normal and unlabored. Afebrile. Vitals Time Taken: 2:27 PM, Height: 68 in, Temperature: 98.1 F, Pulse: 70 bpm, Respiratory Rate: 16 breaths/min, Blood Pressure: 152/57 mmHg. Eyes Nonicteric. Reactive to light. Ears, Nose, Mouth, and Throat Lips, teeth, and gums WNL.Marland Kitchen Moist mucosa without lesions . Neck supple and nontender. No palpable supraclavicular or cervical adenopathy. Normal sized without goiter. Respiratory WNL. No retractions.. Cardiovascular Pedal Pulses WNL. No clubbing, cyanosis or edema. Lymphatic No adneopathy. No adenopathy. No adenopathy. Musculoskeletal IANMICHAEL, AMESCUA ([id number]) Adexa without tenderness or enlargement.. Digits and nails w/o clubbing, cyanosis, infection, petechiae, ischemia, or inflammatory conditions.Marland Kitchen Psychiatric Judgement and insight Intact.. No evidence of depression, anxiety, or agitation.. General Notes: his leg has a foul odor but after washing it out well it looks fairly clean and does not look infected. Integumentary (Hair, Skin) No suspicious lesions. No crepitus or fluctuance. No peri-wound warmth or erythema. No masses.. Wound #5 status is Open. Original cause of wound was Gradually Appeared. The wound is located on the Right,Medial Lower Leg. The wound measures 10cm length x 5.4cm width x 0.2cm depth; 42.412cm^2 area and 8.482cm^3 volume. The wound is limited to skin breakdown. There is no tunneling or undermining noted. There is a large amount of purulent drainage noted. The wound margin is flat and intact. There is large (67-100%) red, pink granulation within the wound bed. There is no necrotic  tissue within the wound bed. The periwound skin appearance exhibited: Maceration, Moist. The periwound skin appearance did not exhibit: Callus, Crepitus, Excoriation, Fluctuance, Friable, Induration, Localized Edema, Rash, Scarring, Dry/Scaly, Atrophie Blanche, Cyanosis, Ecchymosis, Hemosiderin Staining, Mottled, Pallor, Rubor, Erythema. Periwound temperature was  noted as No Abnormality. 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 I will also put him on doxycycline 100 mg twice a day for 2 weeks. In a week's time we'll apply his fourth application of Apligraf. Plan JAQUANE, BOUGHNER ([id number]) Wound Cleansing: Wound #5 Right,Medial Lower Leg: Cleanse wound with mild soap and water Skin Barriers/Peri-Wound Care: Wound #5 Right,Medial Lower Leg: Barrier cream Primary Wound Dressing: Wound #5 Right,Medial Lower Leg: Other: - Sorbact Secondary Dressing: Wound #5 Right,Medial Lower Leg: Drawtex Dressing Change Frequency: Wound #5 Right,Medial Lower Leg: Change dressing every week Follow-up Appointments: Wound #5 Right,Medial Lower Leg: Return Appointment in 1 week. Edema Control: Wound #5 Right,Medial Lower Leg: 4-Layer Compression System - Right Lower Extremity - profore Medications-please add to medication list.: Wound #5 Right,Medial Lower Leg: P.O. Antibiotics I'm going to use Sorbact, Drawtex and a Profore 4-layer compression. I will also put him on doxycycline 100 mg twice a day for 2 weeks. In a week's time we'll apply his fourth application of Apligraf. Electronic Signature(s) Signed: 04/18/2015 2:38:10 PM By: Evlyn Kanner MD, FACS Entered By:  Evlyn Kanner on 04/18/2015 14:38:10 YIGIT, NORKUS ([id number]) -------------------------------------------------------------------------------- SuperBill Details Patient Name: LONDELL, NOLL. Date of Service: 04/18/2015 Medical Record Number: [id number] Patient Account Number: [id number] Date of Birth/Sex: [birth year] (79 y.o. 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 ICD-10 Description Diagnosis I87.311 Chronic venous hypertension (idiopathic) with ulcer of right lower  extremity Physician Procedures CPT4: Description Modifier Quantity Code [id number] - WC PHYS LEVEL 3 - EST PT 1 ICD-10 Description Diagnosis I87.311 Chronic venous hypertension (idiopathic) with ulcer of right lower extremity L97.313 Non-pressure chronic ulcer of right ankle with  necrosis of muscle E66.01 Morbid (severe) obesity due to excess calories I89.0 Lymphedema, not elsewhere classified CPT4: 01027 BILATERAL: Application of multi-layer venous compression system; leg 1 (below knee), including ankle and foot. ICD-10 Description Diagnosis I87.311 Chronic venous hypertension (idiopathic) with ulcer of right lower extremity JUDA, TOEPFER  ([id number]) Electronic Signature(s) Signed: 04/18/2015 3:59:11 PM By: Elpidio Eric BSN, RN Signed: 04/18/2015 4:06:38 PM By:  Evlyn Kanner MD, FACS Previous Signature: 04/18/2015 3:58:51 PM Version By: Elpidio Eric BSN, RN Previous Signature: 04/18/2015 2:38:24 PM Version By: Evlyn Kanner MD, FACS Entered By: Elpidio Eric on 04/18/2015 15:59:11

## 2015-04-25 ENCOUNTER — Encounter: Payer: Medicare Other | Admitting: Surgery

## 2015-04-25 DIAGNOSIS — L97313 Non-pressure chronic ulcer of right ankle with necrosis of muscle: Secondary | ICD-10-CM | POA: Diagnosis not present

## 2015-04-26 NOTE — Progress Notes (Addendum)
**Note [name]-Identified via Obfuscation** MATHIUS, BIRKELAND ([id number]) Visit Report for 04/25/2015 Chief Complaint Document Details Patient Name: [name], [name] Date of Service: 04/25/2015 1:45 PM Medical Record Number: [id number] Patient Account Number: [id number] Date of Birth/Sex: [birth year] (79 y.o. Male) Treating RN: Clover Mealy, RN, BSN, [place] Sink Primary Care Physician: Terance Hart, DAVID Other Clinician: Referring Physician: Dorothey Baseman Treating Physician/Extender: Rudene Re in Treatment: 14 Information Obtained from: Patient Chief Complaint Patient presents for treatment of an open ulcer due to venous insufficiency. The patient has had a open wound on his right medial ankle for at least 3 years and was last seen in the wound clinic in February 2015. He was lost to follow-up after that. Electronic Signature(s) Signed: 04/25/2015 2:02:56 PM By: Evlyn Kanner MD, FACS Entered By: Evlyn Kanner on 04/25/2015 14:02:55 [name], [name] ([id number]) -------------------------------------------------------------------------------- HPI Details Patient Name: [name], [name] Date of Service: 04/25/2015 1:45 PM Medical Record Number: [id number] Patient Account Number: [id number] Date of Birth/Sex: 01-03-1936 (79 y.o. Male) Treating RN: Clover Mealy, RN, BSN, [place] Sink Primary Care Physician: Terance Hart, DAVID Other Clinician: Referring Physician: Terance Hart, DAVID Treating Physician/Extender: Rudene Re in Treatment: 14 History of Present Illness Location: right lower extremity ulceration Quality: Patient reports experiencing a dull pain to affected area(s). Severity: Patient states wound are getting worse. 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 Modifying Factors: Consults to this date include:has received treatment in the wound center in the past but stopped coming here since February 2015 Associated Signs and Symptoms: Patient reports having difficulty standing for long periods. HPI Description: 79 year old male who is known to have progressive weakness and has been in a nursing home for a while has had chronic lower extremity edema both legs and a large open wound on the right lower extremity which is has at least for about 3-4 years. The patient was seen in the wound clinic before and has been treated until February 2015 when he was lost to follow-up. Past medical history is significant for hypertension, gout, venous stasis ulcers, Parkinson's [name], constipation. He's not been a diabetic but his last hemoglobin A1c was 6 in March 2015. There is documentation that he's had endovenous ablation of bilateral varicose veins but we have no documentation about this and this may be done at least 4-5 years ago. 01/22/2015 -- he has his vascular test is scheduled for this afternoon. 01/29/2015 -- the patient's vascular test was canceled last week because he was unable to get onto the examining bed at [facility]. His Unna's boot was not applied either and the patient had a dressing placed by home health. Today when his dressing was removed we found a lot of maggots in his right lower extremity. 02/07/2015 -- Was seen in the vascular office by the PA and she noted that a bilateral venous duplex study did not show any DVT, SVT or reflux bilaterally. The patient was advised to continue with Unna's boot and would at some stage to require graduated compression stockings of the 20-30 mmHg variety. In the future lymphedema pumps would also be considered. 02/14/2015 -- his dressing is smelling quite a bit and there is a discoloration of the wound suggestive of an infection. Deep tissue cultures will  be taken today. 02/21/2015 --  the culture is back and it has growth moderate growth of Proteus and gram-negative rods sensitive to sulfa, ampicillin, cephazolin, Zosyn. He is already on doxycycline and his wound is looking clean and hence we will continue with this. 02/28/2015 -- he's been doing fine still on his anti-buttocks and has no fresh issues. 03/07/2015 -- I was told that because he lives in a nursing home the Apligraf was denied by his insurance company. 03/14/2015 -- we are awaiting samples and a trial of Puraply to be tried for his ulceration. 03/21/2015 -- his Apligraf was approved and he is here for his first application of Apligraf 03/28/2015 -- his wound has had quite a bit of secretion and needs to be changed and hence I will use the [name], [name] ([id number]) second application of Apligraf. 04/04/2015 -- he is here for a wound check but has a lot of secretions and hence his dressing needs to be taken down. 04/09/2015 -- he is here for his third application of Apligraf 04/18/2015 -- he is here for a wound check and though he does not have any overt infection he always has a foul odor to his leg. 04/25/2015 -- his wound has been doing much better and he has minimal drainage and the size is smaller. Of note he is on doxycycline Electronic Signature(s) Signed: 04/25/2015 2:03:28 PM By: Evlyn Kanner MD, FACS Entered By: Evlyn Kanner on 04/25/2015 14:03:28 [name], [name] ([id number]) -------------------------------------------------------------------------------- Physical Exam Details Patient Name: [name], [name] Date of Service: 04/25/2015 1:45 PM Medical Record Number: [id number] Patient Account Number: [id number] Date of Birth/Sex: 10-Aug-1935 (79 y.o. Male) Treating RN: Clover Mealy, RN, BSN, [place] Sink Primary Care Physician: Terance Hart, DAVID Other Clinician: Referring Physician: Terance Hart, DAVID Treating Physician/Extender: Rudene Re in Treatment: 14 Constitutional . Pulse regular.  Respirations normal and unlabored. Afebrile. . Eyes Nonicteric. Reactive to light. Ears, Nose, Mouth, and Throat Lips, teeth, and gums WNL.Marland Kitchen Moist mucosa without lesions . Neck supple and nontender. No palpable supraclavicular or cervical adenopathy. Normal sized without goiter. Respiratory WNL. No retractions.. Cardiovascular Pedal Pulses WNL. No clubbing, cyanosis or edema. Lymphatic No adneopathy. No adenopathy. No adenopathy. Musculoskeletal Adexa without tenderness or enlargement.. Digits and nails w/o clubbing, cyanosis, infection, petechiae, ischemia, or inflammatory conditions.. Integumentary (Hair, Skin) No suspicious lesions. No crepitus or fluctuance. No peri-wound warmth or erythema. No masses.Marland Kitchen Psychiatric Judgement and insight Intact.. No evidence of depression, anxiety, or agitation.. Notes There is no odor from the wound overall is looking much better cleaner and smaller. Electronic Signature(s) Signed: 04/25/2015 2:04:15 PM By: Evlyn Kanner MD, FACS Entered By: Evlyn Kanner on 04/25/2015 14:04:15 [name], [name] ([id number]) -------------------------------------------------------------------------------- Physician Orders Details Patient Name: [name], [name] Date of Service: 04/25/2015 1:45 PM Medical Record Number: [id number] Patient Account Number: [id number] Date of Birth/Sex: 1936/05/31 (79 y.o. Male) Treating RN: Clover Mealy, RN, BSN,  Sink Primary Care Physician: Terance Hart, DAVID Other Clinician: Referring Physician: Terance Hart, DAVID Treating Physician/Extender: Rudene Re in Treatment: 28 Verbal / Phone Orders: Yes Clinician: Afful, RN, BSN, Rita Read Back and Verified: Yes Diagnosis Coding Wound Cleansing Wound #5 Right,Medial Lower Leg o Cleanse wound with mild soap and water Skin Barriers/Peri-Wound Care Wound #5 Right,Medial Lower Leg o Barrier cream Primary Wound Dressing Wound #5 Right,Medial Lower Leg o Other: -  Sorbact Secondary Dressing Wound #5 Right,Medial Lower Leg o Drawtex Dressing Change Frequency Wound #5 Right,Medial Lower Leg o Change dressing every week Follow-up Appointments Wound #5 Right,Medial Lower Leg o Return  Appointment in 1 week. Edema Control Wound #5 Right,Medial Lower Leg o 4-Layer Compression System - Right Lower Extremity - profore Electronic Signature(s) Signed: 04/25/2015 1:57:43 PM By: Elpidio Eric BSN, RN Signed: 04/25/2015 4:31:29 PM By: Evlyn Kanner MD, FACS Entered By: Elpidio Eric on 04/25/2015 13:57:42 [name], [name] ([id number]) [name], [name] ([id number]) -------------------------------------------------------------------------------- Problem List Details Patient Name: [name], [name] Date of Service: 04/25/2015 1:45 PM Medical Record Number: [id number] Patient Account Number: [id number] Date of Birth/Sex: 11/24/1935 (79 y.o. 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 Date of Service: 04/25/2015 1:45 PM Medical Record Number: [id number] Patient Account Number:  [id number] Date of Birth/Sex: October 11, 1935 (79 y.o. Male) Treating RN: Clover Mealy, RN, BSN, [place] Sink Primary Care Physician: Terance Hart, DAVID Other Clinician: Referring Physician: Dorothey Baseman Treating Physician/Extender: Rudene Re in Treatment: 14 Subjective Chief Complaint Information obtained from Patient Patient presents for treatment of an open ulcer due to venous insufficiency. The patient has had a open wound on his right medial ankle for at least 3 years and was last seen in the wound clinic in February 2015. He was lost to follow-up after that. History of Present Illness (HPI) The following HPI elements were documented for the patient's wound: Location: right lower extremity ulceration Quality: Patient reports experiencing a dull pain to affected area(s). Severity: Patient states wound are getting worse. 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 Modifying Factors: Consults to this date include:has received treatment in the wound center in the past but stopped coming here since February 2015 Associated Signs and Symptoms: Patient reports having difficulty standing for long periods. 79 year old male who is known to have progressive weakness and has been in a nursing home for a while has had chronic lower extremity edema both legs and a large open wound on the right lower extremity which is has at least for about 3-4 years. The patient was seen in the wound clinic before and has been treated until February 2015 when he was lost to follow-up. Past medical history is significant for hypertension, gout, venous stasis ulcers, Parkinson's [name], constipation. He's not been a diabetic but  his last hemoglobin A1c was 6 in March 2015. There is documentation that he's had endovenous ablation of bilateral varicose veins but we have no documentation about this and this may be done at least 4-5 years ago. 01/22/2015 -- he has his vascular test is scheduled for this afternoon. 01/29/2015 -- the patient's vascular test was canceled last week because he was unable to get onto the examining bed at [facility]. His Unna's boot was not  applied either and the patient had a dressing placed by home health. Today when his dressing was removed we found a lot of maggots in his right lower extremity. 02/07/2015 -- Was seen in the vascular office by the PA and she noted that a bilateral venous duplex study did not show any DVT, SVT or reflux bilaterally. The patient was advised to continue with Unna's boot and would at some stage to require graduated compression stockings of the 20-30 mmHg variety. In the future lymphedema pumps would also be considered. 02/14/2015 -- his dressing is smelling quite a bit and there is a discoloration of the wound suggestive of an [name], [name] ([id number]) infection. Deep tissue cultures will be taken today. 02/21/2015 -- the culture is back and it has growth moderate growth of Proteus and gram-negative rods sensitive to sulfa, ampicillin, cephazolin, Zosyn. He is already on doxycycline and his wound is looking clean and hence we will continue with this. 02/28/2015 -- he's been doing fine still on his anti-buttocks and has no fresh issues. 03/07/2015 -- I was told that because he lives in a nursing home the Apligraf was denied by his insurance company. 03/14/2015 -- we are awaiting samples and a trial of Puraply to be tried for his ulceration. 03/21/2015 -- his Apligraf was approved and he is here for his first application of Apligraf 03/28/2015 -- his wound has had quite a bit of secretion and needs to be changed and hence I will use the second application of  Apligraf. 04/04/2015 -- he is here for a wound check but has a lot of secretions and hence his dressing needs to be taken down. 04/09/2015 -- he is here for his third application of Apligraf 04/18/2015 -- he is here for a wound check and though he does not have any overt infection he always has a foul odor to his leg. 04/25/2015 -- his wound has been doing much better and he has minimal drainage and the size is smaller. Of note he is on doxycycline Objective Constitutional Pulse regular. Respirations normal and unlabored. Afebrile. Vitals Time Taken: 1:44 PM, Height: 68 in, Temperature: 97.7 F, Pulse: 70 bpm, Respiratory Rate: 16 breaths/min, Blood Pressure: 150/56 mmHg. Eyes Nonicteric. Reactive to light. Ears, Nose, Mouth, and Throat Lips, teeth, and gums WNL.Marland Kitchen Moist mucosa without lesions . Neck supple and nontender. No palpable supraclavicular or cervical adenopathy. Normal sized without goiter. Respiratory WNL. No retractions.. Cardiovascular Pedal Pulses WNL. No clubbing, cyanosis or edema. Lymphatic No adneopathy. No adenopathy. No adenopathy. DEVRON, COHICK ([id number]) Musculoskeletal Adexa without tenderness or enlargement.. Digits and nails w/o clubbing, cyanosis, infection, petechiae, ischemia, or inflammatory conditions.Marland Kitchen Psychiatric Judgement and insight Intact.. No evidence of depression, anxiety, or agitation.. General Notes: There is no odor from the wound overall is looking much better cleaner and smaller. Integumentary (Hair, Skin) No suspicious lesions. No crepitus or fluctuance. No peri-wound warmth or erythema. No masses.. Wound #5 status is Open. Original cause of wound was Gradually Appeared. The wound is located on the Right,Medial Lower Leg. The wound measures 9cm length x 4cm width x 0.2cm depth; 28.274cm^2 area and 5.655cm^3 volume. The wound is limited to skin breakdown. There is no tunneling or undermining noted. There is a large amount of  serosanguineous drainage noted. The wound margin is flat and intact. There is large (67-100%) red, pink granulation within the wound bed. There is no necrotic tissue within the wound bed. The periwound skin appearance exhibited: Localized Edema, Moist. The periwound skin appearance  did not exhibit: Callus, Crepitus, Excoriation, Fluctuance, Friable, Induration, Rash, Scarring, Dry/Scaly, Maceration, Atrophie Blanche, Cyanosis, Ecchymosis, Hemosiderin Staining, Mottled, Pallor, Rubor, Erythema. Periwound temperature was noted as No Abnormality. 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 I will continue him on doxycycline 100 mg twice a day for 2 weeks. In a week's time we'll apply his fourth application of Apligraf. OZZY, BOHLKEN ([id number]) Plan Wound Cleansing: Wound #5 Right,Medial Lower Leg: Cleanse wound with mild soap and water Skin Barriers/Peri-Wound Care: Wound #5 Right,Medial Lower Leg: Barrier cream Primary Wound Dressing: Wound #5 Right,Medial Lower Leg: Other: - Sorbact Secondary Dressing: Wound #5 Right,Medial Lower Leg: Drawtex Dressing Change Frequency: Wound #5 Right,Medial Lower Leg: Change dressing every week Follow-up Appointments: Wound #5 Right,Medial Lower Leg: Return Appointment in 1 week. Edema Control: Wound #5 Right,Medial Lower Leg: 4-Layer Compression System - Right Lower Extremity - profore I'm going to use Sorbact, Drawtex and a Profore 4-layer compression. I will continue him on doxycycline 100 mg twice a day for 2 weeks. In a week's time we'll apply his fourth application of Apligraf. Electronic Signature(s) Signed:  04/29/2015 8:17:18 AM By: Evlyn Kanner MD, FACS Previous Signature: 04/25/2015 2:04:52 PM Version By: Evlyn Kanner MD, FACS Entered By: Evlyn Kanner on 04/29/2015 08:17:18 GARRELL, FLAGG ([id number]) -------------------------------------------------------------------------------- SuperBill Details Patient Name: OLAND, ARQUETTE. Date of Service: 04/25/2015 Medical Record Number: [id number] Patient Account Number: [id number] Date of Birth/Sex: 1936-01-16 (79 y.o. 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 E66.01 Morbid (severe) obesity due to excess calories I89.0 Lymphedema, not elsewhere classified Facility Procedures CPT4: Description Modifier Quantity Code [id number] (Facility Use Only) [phone] - APPLY MULTLAY COMPRS LWR RT 1 LEG Physician Procedures CPT4 Code Description: [id number] - WC PHYS LEVEL 3 - EST PT ICD-10 Description Diagnosis I87.311 Chronic venous hypertension (idiopathic) with ulcer of 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 E66.01 Morbid (severe) obesity due  to excess calories I89.0 Lymphedema, not elsewhere classified Modifier: right lower s of muscle Quantity: 1 extremity Electronic Signature(s) Signed: 04/25/2015 2:13:45 PM By: Elpidio Eric BSN, RN Signed: 04/25/2015 4:31:29 PM By: Evlyn Kanner MD, FACS Previous Signature: 04/25/2015 2:06:03 PM Version By: Evlyn Kanner MD, FACS Entered By: Elpidio Eric on 04/25/2015 14:13:44

## 2015-04-26 NOTE — Progress Notes (Addendum)
CLARK, CUFF ([id number]) Visit Report for 04/25/2015 Arrival Information Details Patient Name: [name], [name] Date of Service: 04/25/2015 1:45 PM Medical Record Number: [id number] Patient Account Number: [id number] Date of Birth/Sex: 18-Sep-1935 (79 y.o. Male) Treating RN: Clover Mealy, RN, BSN, [place] Sink Primary Care Physician: Terance Hart, DAVID Other Clinician: Referring Physician: Terance Hart, DAVID Treating Physician/Extender: Rudene Re in Treatment: 14 Visit Information History Since Last Visit Any new allergies or adverse reactions: No Patient Arrived: Wheel Chair Had a fall or experienced change in No activities of daily living that may affect Arrival Time: 13:44 risk of falls: Accompanied By: self Signs or symptoms of abuse/neglect since last No Transfer Assistance: None visito Patient Identification Verified: Yes Has Dressing in Place as Prescribed: Yes Secondary Verification Process Yes Has Compression in Place as Prescribed: Yes Completed: Pain Present Now: No Patient Requires Transmission-Based No Precautions: Patient Has Alerts: No Electronic Signature(s) Signed: 04/25/2015 1:44:22 PM By: Elpidio Eric BSN, RN Entered By: Elpidio Eric on 04/25/2015 13:44:22 [name] ([id number]) -------------------------------------------------------------------------------- Encounter Discharge Information Details Patient Name: [name] Date of Service: 04/25/2015 1:45 PM Medical Record Number: [id number] Patient Account Number: [id number] Date of Birth/Sex: 1935-10-30 (79 y.o. Male) Treating RN: Clover Mealy, RN, BSN, [place] Sink Primary Care Physician: Terance Hart, DAVID Other Clinician: Referring Physician: Terance Hart, DAVID Treating Physician/Extender: Rudene Re in Treatment: 14 Encounter Discharge Information Items Discharge Pain Level: 0 Discharge Condition: Stable Ambulatory Status: Wheelchair Discharge Destination: Nursing Home Transportation:  Other Accompanied By: self Schedule Follow-up Appointment: No Medication Reconciliation completed and provided to Patient/Care No Xaden Kaufman: Provided on Clinical Summary of Care: 04/25/2015 Form Type Recipient Paper Patient [facility] Electronic Signature(s) Signed: 04/25/2015 2:14:58 PM By: Elpidio Eric BSN, RN Previous Signature: 04/25/2015 2:10:45 PM Version By: Gwenlyn Perking Entered By: Elpidio Eric on 04/25/2015 14:14:58 [name] ([id number]) -------------------------------------------------------------------------------- Lower Extremity Assessment Details Patient Name: [name] Date of Service: 04/25/2015 1:45 PM Medical Record Number: [id number] Patient Account Number: [id number] Date of Birth/Sex: June 17, 1936 (78 y.o. Male) Treating RN: Clover Mealy, RN, BSN, [place] Sink Primary Care Physician: Terance Hart, DAVID Other Clinician: Referring Physician: Terance Hart, DAVID Treating Physician/Extender: Rudene Re in Treatment: 14 Edema Assessment Assessed: [Left: No] Franne Forts: No] Edema: [Left: Ye] [Right: s] Calf Left: Right: Point of Measurement: 40 cm From Medial Instep cm 37.8 cm Ankle Left: Right: Point of Measurement: 8 cm From Medial Instep cm 28.8 cm Vascular Assessment Pulses: Posterior Tibial Dorsalis Pedis Palpable: [Right:Yes] Extremity colors, hair growth, and conditions: Extremity Color: [Right:Hyperpigmented] Hair Growth on Extremity: [Right:No] Temperature of Extremity: [Right:Warm] Capillary Refill: [Right:< 3 seconds] Toe Nail Assessment Left: Right: Thick: Yes Discolored: Yes Deformed: No Electronic Signature(s) Signed: 04/25/2015 1:51:48 PM By: Elpidio Eric BSN, RN Entered By: Elpidio Eric on 04/25/2015 13:51:48 [name] ([id number]) -------------------------------------------------------------------------------- Multi Wound Chart Details Patient Name: [name] Date of Service: 04/25/2015 1:45 PM Medical Record Number:  [id number] Patient Account Number: [id number] Date of Birth/Sex: [birth year] (79 y.o. Male) Treating RN: Clover Mealy, RN, BSN, [place] Sink Primary Care Physician: Terance Hart, DAVID Other Clinician: Referring Physician: Terance Hart, DAVID Treating Physician/Extender: Rudene Re in Treatment: 14 Vital Signs Height(in): 68 Pulse(bpm): 70 Weight(lbs): Blood Pressure 150/56 (mmHg): Body Mass Index(BMI): Temperature(F): 97.7 Respiratory Rate 16 (breaths/min): Photos: [5:No Photos] [N/A:N/A] Wound Location: [5:Right Lower Leg - Medial] [N/A:N/A] Wounding Event: [5:Gradually Appeared] [N/A:N/A] Primary Etiology: [5:Venous Leg Ulcer] [N/A:N/A] Comorbid History: [5:Cataracts, Anemia, Hypertension, Osteoarthritis] [N/A:N/A] Date Acquired: [5:11/13/2014] [N/A:N/A] Weeks of Treatment: [5:14] [N/A:N/A] Wound Status: [5:Open] [N/A:N/A] Measurements L x W x D 9x4x0.2 [N/A:N/A] (cm)  Area (cm) : [5:28.274] [N/A:N/A] Volume (cm) : [5:5.655] [N/A:N/A] % Reduction in Area: [5:72.40%] [N/A:N/A] % Reduction in Volume: 81.60% [N/A:N/A] Classification: [5:Full Thickness Without Exposed Support Structures] [N/A:N/A] Exudate Amount: [5:Large] [N/A:N/A] Exudate Type: [5:Serosanguineous] [N/A:N/A] Exudate Color: [5:red, brown] [N/A:N/A] Foul Odor After [5:Yes] [N/A:N/A] Cleansing: Odor Anticipated Due to No [N/A:N/A] Product Use: Wound Margin: [5:Flat and Intact] [N/A:N/A] Granulation Amount: [5:Large (67-100%)] [N/A:N/A] Granulation Quality: [5:Red, Pink] [N/A:N/A] Necrotic Amount: [5:None Present (0%)] [N/A:N/A] Exposed Structures: Fascia: No N/A N/A Fat: No Tendon: No Muscle: No Joint: No Bone: No Limited to Skin Breakdown Epithelialization: Small (1-33%) N/A N/A Periwound Skin Texture: Edema: Yes N/A N/A Excoriation: No Induration: No Callus: No Crepitus: No Fluctuance: No Friable: No Rash: No Scarring: No Periwound Skin Moist: Yes N/A N/A Moisture: Maceration: No Dry/Scaly:  No Periwound Skin Color: Atrophie Blanche: No N/A N/A Cyanosis: No Ecchymosis: No Erythema: No Hemosiderin Staining: No Mottled: No Pallor: No Rubor: No Temperature: No Abnormality N/A N/A Tenderness on No N/A N/A Palpation: Wound Preparation: Ulcer Cleansing: Other: N/A N/A soap and water Topical Anesthetic Applied: Other: lidocaine 4% Treatment Notes Electronic Signature(s) Signed: 04/25/2015 1:57:10 PM By: Elpidio Eric BSN, RN Entered By: Elpidio Eric on 04/25/2015 13:57:10 [name], [name] ([id number]) -------------------------------------------------------------------------------- Multi-Disciplinary Care Plan Details Patient Name: RAMAN, BLYDEN. Date of Service: 04/25/2015 1:45 PM Medical Record Number: [id number] Patient Account Number: [id number] Date of Birth/Sex: [birth year] (79 y.o. Male) Treating RN: Clover Mealy, RN, BSN, [place] Sink Primary Care Physician: Terance Hart, DAVID Other Clinician: Referring Physician: Terance Hart, DAVID Treating Physician/Extender: Rudene Re in Treatment: 14 Active Inactive Orientation to the Wound Care Program Nursing Diagnoses: Knowledge deficit related to the wound healing center program Goals: Patient/caregiver will verbalize understanding of the Wound Healing Center Program Date Initiated: 01/15/2015 Goal Status: Active Interventions: Provide education on orientation to the wound center Notes: Venous Leg Ulcer Nursing Diagnoses: Knowledge deficit related to disease process and management Potential for venous Insuffiency (use before diagnosis confirmed) Goals: Patient will maintain optimal edema control Date Initiated: 01/15/2015 Goal Status: Active Patient/caregiver will verbalize understanding of disease process and disease management Date Initiated: 01/15/2015 Goal Status: Active Verify adequate tissue perfusion prior to therapeutic compression application Date Initiated: 01/15/2015 Goal Status:  Active Interventions: Assess peripheral edema status every visit. Compression as ordered Provide education on venous insufficiency [name], [name] ([id number]) Treatment Activities: Test ordered outside of clinic : 04/25/2015 Therapeutic compression applied : 04/25/2015 Venous Duplex Doppler : 04/25/2015 Notes: Wound/Skin Impairment Nursing Diagnoses: Impaired tissue integrity Knowledge deficit related to ulceration/compromised skin integrity Goals: Patient/caregiver will verbalize understanding of skin care regimen Date Initiated: 01/15/2015 Goal Status: Active Ulcer/skin breakdown will have a volume reduction of 30% by week 4 Date Initiated: 01/15/2015 Goal Status: Active Ulcer/skin breakdown will have a volume reduction of 50% by week 8 Date Initiated: 01/15/2015 Goal Status: Active Ulcer/skin breakdown will have a volume reduction of 80% by week 12 Date Initiated: 01/15/2015 Goal Status: Active Ulcer/skin breakdown will heal within 14 weeks Date Initiated: 01/15/2015 Goal Status: Active Interventions: Assess patient/caregiver ability to perform ulcer/skin care regimen upon admission and as needed Assess ulceration(s) every visit Provide education on ulcer and skin care Notes: Electronic Signature(s) Signed: 04/25/2015 1:57:03 PM By: Elpidio Eric BSN, RN Previous Signature: 04/25/2015 1:56:20 PM Version By: Elpidio Eric BSN, RN Entered By: Elpidio Eric on 04/25/2015 13:57:03 [name], [name] ([id number]) -------------------------------------------------------------------------------- Pain Assessment Details Patient Name: [name] Date of Service: 04/25/2015 1:45 PM Medical Record Number: [id number] Patient Account Number: [id number] Date of  Birth/Sex: [birth year] (79 y.o. Male) Treating RN: Clover Mealy, RN, BSN, [place] Sink Primary Care Physician: Dorothey Baseman Other Clinician: Referring Physician: Terance Hart, DAVID Treating Physician/Extender: Rudene Re in  Treatment: 14 Active Problems Location of Pain Severity and Description of Pain Patient Has Paino No Site Locations Pain Management and Medication Current Pain Management: Electronic Signature(s) Signed: 04/25/2015 1:44:38 PM By: Elpidio Eric BSN, RN Entered By: Elpidio Eric on 04/25/2015 13:44:38 [name] ([id number]) -------------------------------------------------------------------------------- Patient/Caregiver Education Details Patient Name: [name] Date of Service: 04/25/2015 1:45 PM Medical Record Number: [id number] Patient Account Number: [id number] Date of Birth/Gender: [birth year] (79 y.o. Male) Treating RN: Clover Mealy, RN, BSN, [place] Sink Primary Care Physician: Terance Hart, DAVID Other Clinician: Referring Physician: Terance Hart DAVID Treating Physician/Extender: Rudene Re in Treatment: 14 Education Assessment Education Provided To: Patient Education Topics Provided Venous: Methods: Explain/Verbal Responses: State content correctly Welcome To The Wound Care Center: Methods: Explain/Verbal Responses: State content correctly Wound/Skin Impairment: Methods: Explain/Verbal Responses: State content correctly Electronic Signature(s) Signed: 04/25/2015 2:15:20 PM By: Elpidio Eric BSN, RN Entered By: Elpidio Eric on 04/25/2015 14:15:20 [name] ([id number]) -------------------------------------------------------------------------------- Wound Assessment Details Patient Name: [name] Date of Service: 04/25/2015 1:45 PM Medical Record Number: [id number] Patient Account Number: [id number] Date of Birth/Sex: [birth year] (79 y.o. Male) Treating RN: Clover Mealy, RN, BSN, [place] Primary Care Physician: Terance Hart, DAVID Other Clinician: Referring Physician: Terance Hart, DAVID Treating Physician/Extender: Rudene Re in Treatment: 14 Wound Status Wound Number: 5 Primary Venous Leg Ulcer Etiology: Wound Location: Right Lower Leg -  Medial Wound Status: Open Wounding Event: Gradually Appeared Comorbid Cataracts, Anemia, Hypertension, Date Acquired: 11/13/2014 History: Osteoarthritis Weeks Of Treatment: 14 Clustered Wound: No Photos Wound Measurements Length: (cm) 9 Width: (cm) 4 Depth: (cm) 0.2 Area: (cm) 28.274 Volume: (cm) 5.655 % Reduction in Area: 72.4% % Reduction in Volume: 81.6% Epithelialization: Small (1-33%) Tunneling: No Undermining: No Wound Description Full Thickness Without Exposed Classification: Support Structures Wound Margin: Flat and Intact Exudate Large Amount: Exudate Type: Serosanguineous Exudate Color: red, brown Foul Odor After Cleansing: Yes Due to Product Use: No Wound Bed Granulation Amount: Large (67-100%) Exposed Structure Granulation Quality: Red, Pink Fascia Exposed: No Necrotic Amount: None Present (0%) Fat Layer Exposed: No [name], [name] ([id number]) Tendon Exposed: No Muscle Exposed: No Joint Exposed: No Bone Exposed: No Limited to Skin Breakdown Periwound Skin Texture Texture Color No Abnormalities Noted: No No Abnormalities Noted: No Callus: No Atrophie Blanche: No Crepitus: No Cyanosis: No Excoriation: No Ecchymosis: No Fluctuance: No Erythema: No Friable: No Hemosiderin Staining: No Induration: No Mottled: No Localized Edema: Yes Pallor: No Rash: No Rubor: No Scarring: No Temperature / Pain Moisture Temperature: No Abnormality No Abnormalities Noted: No Dry / Scaly: No Maceration: No Moist: Yes Wound Preparation Ulcer Cleansing: Other: soap and water, Topical Anesthetic Applied: Other: lidocaine 4%, Treatment Notes Wound #5 (Right, Medial Lower Leg) 1. Cleansed with: Cleanse wound with antibacterial soap and water 3. Peri-wound Care: Barrier cream Moisturizing lotion 4. Dressing Applied: Other dressing (specify in notes) 5. Secondary Dressing Applied ABD Pad 7. Secured with 4-Layer Compression System - Right Lower  Extremity Notes Sorbact, drawtex Electronic Signature(s) Signed: 04/26/2015 9:20:10 AM By: Elpidio Eric BSN, RN Previous Signature: 04/25/2015 1:53:45 PM Version By: Elpidio Eric BSN, RN [name], [name] ([id number]) Entered By: Elpidio Eric on 04/26/2015 09:20:09 [name], [name] ([id number]) -------------------------------------------------------------------------------- Vitals Details Patient Name: [name], [name] Date of Service: 04/25/2015 1:45 PM Medical Record Number: [id number] Patient Account Number: [id number] Date of Birth/Sex: [birth year] (79 y.o. Male)  Treating RN: Clover Mealy, RN, BSN, [place] Primary Care Physician: Dorothey Baseman Other Clinician: Referring Physician: Terance Hart, DAVID Treating Physician/Extender: Rudene Re in Treatment: 14 Vital Signs Time Taken: 13:44 Temperature (F): 97.7 Height (in): 68 Pulse (bpm): 70 Respiratory Rate (breaths/min): 16 Blood Pressure (mmHg): 150/56 Reference Range: 80 - 120 mg / dl Electronic Signature(s) Signed: 04/25/2015 1:45:00 PM By: Elpidio Eric BSN, RN Entered By: Elpidio Eric on 04/25/2015 13:45:00

## 2015-05-02 ENCOUNTER — Encounter: Payer: Medicare Other | Admitting: Surgery

## 2015-05-02 DIAGNOSIS — L97313 Non-pressure chronic ulcer of right ankle with necrosis of muscle: Secondary | ICD-10-CM | POA: Diagnosis not present

## 2015-05-02 NOTE — Progress Notes (Addendum)
[name] ([id number]) Visit Report for 05/02/2015 Chief Complaint Document Details Patient Name: [name], [name] Date of Service: 05/02/2015 8:00 AM Medical Record Number: [id number] Patient Account Number: [id number] Date of Birth/Sex: 1935/08/21 (79 y.o. Male) Treating RN: Huel Coventry Primary Care Physician: Dorothey Baseman Other Clinician: Referring Physician: Dorothey Baseman Treating Physician/Extender: Rudene Re in Treatment: 15 Information Obtained from: Patient Chief Complaint Patient presents for treatment of an open ulcer due to venous insufficiency. The patient has had a open wound on his right medial ankle for at least 3 years and was last seen in the wound clinic in February 2015. He was lost to follow-up after that. Electronic Signature(s) Signed: 05/02/2015 8:58:16 AM By: Evlyn Kanner MD, FACS Entered By: Evlyn Kanner on 05/02/2015 08:58:15 [name] ([id number]) -------------------------------------------------------------------------------- Cellular or Tissue Based Product Details Patient Name: [name], [name] Date of Service: 05/02/2015 8:00 AM Medical Record Number: [id number] Patient Account Number: [id number] Date of Birth/Sex: 1936/04/29 (79 y.o. Male) Treating RN: Huel Coventry Primary Care Physician: Dorothey Baseman Other Clinician: Referring Physician: Dorothey Baseman Treating Physician/Extender: Rudene Re in Treatment: 15 Cellular or Tissue Based Wound #5 Right,Medial Lower Leg Product Type Applied to: Performed By: Physician Evlyn Kanner, MD Cellular or Tissue Based Apligraf Product Type: Time-Out Taken: Yes Location: trunk / arms / legs Wound Size (sq cm): 44.2 Product Size (sq cm): 44 Waste Size (sq cm): 0 Amount of Product Applied (sq cm): 44 Lot #: [id number] Expiration Date: 05/02/2015 Fenestrated: Yes Instrument: Blade Reconstituted: Yes Solution Type: saline Solution Amount: 2ml Lot #:  [id number] Solution Expiration 01/03/2017 Date: Secured: Yes Secured With: Steri-Strips Dressing Applied: Yes Primary Dressing: mepitel Procedural Pain: 0 Post Procedural Pain: 0 Response to Treatment: Procedure was tolerated well Post Procedure Diagnosis Same as Pre-procedure Electronic Signature(s) Signed: 05/02/2015 9:08:48 AM By: Evlyn Kanner MD, FACS Entered By: Evlyn Kanner on 05/02/2015 09:08:47 [name] ([id number]) -------------------------------------------------------------------------------- HPI Details Patient Name: [name] Date of Service: 05/02/2015 8:00 AM Medical Record Number: [id number] Patient Account Number: [id number] Date of Birth/Sex: 1935-12-25 (79 y.o. Male) Treating RN: Huel Coventry Primary Care Physician: Dorothey Baseman Other Clinician: Referring Physician: Dorothey Baseman Treating Physician/Extender: Rudene Re in Treatment: 15 History of Present Illness Location: right lower extremity ulceration Quality: Patient reports experiencing a dull pain to affected area(s). Severity: Patient states wound are getting worse. 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 Modifying Factors: Consults to this date include:has received treatment in the wound center in the past but stopped coming here since February 2015 Associated Signs and Symptoms: Patient reports having difficulty standing for long periods. HPI Description: 79 year old male who is known to have progressive weakness and has been in a nursing home for a while has had chronic lower extremity edema both legs and a large open wound on the right lower extremity which is has at least for about  3-4 years. The patient was seen in the wound clinic before and has been treated until February 2015 when he was lost to follow-up. Past medical history is significant for hypertension, gout, venous stasis ulcers, Parkinson's [name], constipation. He's not been a diabetic but his last hemoglobin A1c was  6 in March 2015. There is documentation that he's had endovenous ablation of bilateral varicose veins but we have no documentation about this and this may be done at least 4-5 years ago. 01/22/2015 -- he has his vascular test is scheduled for this afternoon. 01/29/2015 -- the patient's vascular test was canceled last week because he was unable to get onto the examining bed at [facility]. His Unna's boot was not applied either and the patient had a dressing placed by home health. Today when his dressing was removed we found a lot of maggots in his right lower extremity. 02/07/2015 -- Was seen in the vascular office by the PA and she noted that a bilateral venous duplex study did not show any DVT, SVT or reflux bilaterally. The patient was advised to continue with Unna's boot and would at some stage to require graduated compression stockings of the 20-30 mmHg variety. In the future lymphedema pumps would also be considered. 02/14/2015 -- his dressing is smelling quite a bit and there is a discoloration of the wound suggestive of an infection. Deep tissue cultures will be taken today. 02/21/2015 -- the culture is back and it has growth moderate growth of Proteus and gram-negative rods sensitive to sulfa, ampicillin, cephazolin, Zosyn. He is already on doxycycline and his wound is looking clean and hence we will continue with this. 02/28/2015 -- he's been doing fine still on his anti-buttocks and has no fresh issues. 03/07/2015 -- I was told that because he lives in a nursing home the Apligraf was denied by his insurance company. 03/14/2015 -- we are awaiting samples and a trial of Puraply to be  tried for his ulceration. 03/21/2015 -- his Apligraf was approved and he is here for his first application of Apligraf 03/28/2015 -- his wound has had quite a bit of secretion and needs to be changed and hence I will use the [name], [name] ([id number]) second application of Apligraf. 04/04/2015 -- he is here for a wound check but has a lot of secretions and hence his dressing needs to be taken down. 04/09/2015 -- he is here for his third application of Apligraf 04/18/2015 -- he is here for a wound check and though he does not have any overt infection he always has a foul odor to his leg. 04/25/2015 -- his wound has been doing much better and he has minimal drainage and the size is smaller. Of note he is on doxycycline. 05/02/2015 -- he is here for his fourth application of Apligraf Electronic Signature(s) Signed: 05/02/2015 8:59:05 AM By: Evlyn Kanner MD, FACS Entered By: Evlyn Kanner on 05/02/2015 08:59:05 [name], [name] ([id number]) -------------------------------------------------------------------------------- Physical Exam Details Patient Name: [name], [name] Date of Service: 05/02/2015 8:00 AM Medical Record Number: [id number] Patient Account Number: [id number] Date of Birth/Sex: [birth year] (79 y.o. Male) Treating RN: Huel Coventry Primary Care Physician: Dorothey Baseman Other Clinician: Referring Physician: Terance Hart, DAVID Treating Physician/Extender: Rudene Re in Treatment: 15 Constitutional . Pulse regular. Respirations normal and unlabored. Afebrile. . Eyes Nonicteric. Reactive to light. Ears, Nose, Mouth, and Throat Lips, teeth, and gums WNL.Marland Kitchen Moist mucosa without lesions . Neck supple and nontender. No palpable supraclavicular or cervical adenopathy. Normal sized without goiter. Respiratory WNL. No retractions.. Cardiovascular Pedal Pulses WNL. No clubbing, cyanosis or edema. Lymphatic No adneopathy. No adenopathy. No  adenopathy. Musculoskeletal Adexa without tenderness or enlargement.. Digits and nails w/o clubbing, cyanosis, infection, petechiae, ischemia, or inflammatory conditions.. Integumentary (Hair, Skin) No suspicious lesions. No crepitus or fluctuance. No peri-wound  warmth or erythema. No masses.Marland Kitchen Psychiatric Judgement and insight Intact.. No evidence of depression, anxiety, or agitation.. Notes the wound looks clean, and there is no odor and has healthy granulation tissue in the edema is minimal. He will have his next application of Apligraf. Electronic Signature(s) Signed: 05/02/2015 8:59:36 AM By: Evlyn Kanner MD, FACS Entered By: Evlyn Kanner on 05/02/2015 08:59:36 Ayron, Fillinger Chrissie Noa ([id number]) -------------------------------------------------------------------------------- Physician Orders Details Patient Name: [name], [name] Date of Service: 05/02/2015 8:00 AM Medical Record Number: [id number] Patient Account Number: [id number] Date of Birth/Sex: [birth year] (79 y.o. Male) Treating RN: Curtis Sites Primary Care Physician: Dorothey Baseman Other Clinician: Referring Physician: Terance Hart, DAVID Treating Physician/Extender: Rudene Re in Treatment: 15 Verbal / Phone Orders: Yes Clinician: Curtis Sites Read Back and Verified: Yes Diagnosis Coding Wound Cleansing Wound #5 Right,Medial Lower Leg o Cleanse wound with mild soap and water Skin Barriers/Peri-Wound Care Wound #5 Right,Medial Lower Leg o Barrier cream Primary Wound Dressing Wound #5 Right,Medial Lower Leg o Foam Secondary Dressing Wound #5 Right,Medial Lower Leg o ABD pad Dressing Change Frequency Wound #5 Right,Medial Lower Leg o Change dressing every week Follow-up Appointments Wound #5 Right,Medial Lower Leg o Return Appointment in 1 week. Edema Control Wound #5 Right,Medial Lower Leg o 4-Layer Compression System - Right Lower Extremity - profore Advanced Therapies Wound #5  Right,Medial Lower Leg o Apligraf application in clinic; including contact layer, fixation with steri strips, dry gauze and cover dressing. Medications-please add to medication list. KRUZ, CHIU ([id number]) Wound #5 Right,Medial Lower Leg o P.O. Antibiotics Electronic Signature(s) Signed: 05/02/2015 3:59:19 PM By: Evlyn Kanner MD, FACS Signed: 05/02/2015 5:54:13 PM By: Curtis Sites Entered By: Curtis Sites on 05/02/2015 09:00:53 Reice, Bienvenue Chrissie Noa ([id number]) -------------------------------------------------------------------------------- Problem List Details Patient Name: [name], [name] Date of Service: 05/02/2015 8:00 AM Medical Record Number: [id number] Patient Account Number: [id number] Date of Birth/Sex: 03/31/1936 (79 y.o. 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 Date of Service: 05/02/2015 8:00 AM Medical Record Number: [id number] Patient Account Number: [id number] Date of Birth/Sex: 06-02-1936 (79 y.o. Male) Treating RN: Huel Coventry Primary Care Physician: Dorothey Baseman Other  Clinician: Referring Physician: Dorothey Baseman Treating Physician/Extender: Rudene Re in Treatment: 15 Subjective Chief Complaint Information obtained from Patient Patient presents for treatment of an open ulcer due to venous insufficiency. The patient has had a open wound on his right medial ankle for at least 3 years and was last seen in the wound clinic in February 2015. He was lost to follow-up after that. History of Present Illness (HPI) The following HPI elements were documented for the patient's wound: Location: right lower extremity ulceration Quality: Patient reports experiencing a dull pain to affected area(s). Severity: Patient states wound are getting worse. 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 Modifying Factors: Consults to this date include:has received treatment in the wound center in the past but stopped coming here since February 2015 Associated Signs and Symptoms: Patient reports having difficulty standing for long periods. 79 year old male who is known to have progressive weakness and has been in a nursing home for a while has had chronic lower extremity edema both legs and a large open wound on the right lower extremity which is has at least for about 3-4 years. The patient was seen in the wound clinic before and has been treated until February 2015 when he was lost to follow-up. Past medical history is significant for hypertension, gout, venous stasis ulcers, Parkinson's [name], constipation. He's not been a diabetic but his last hemoglobin A1c was 6 in March 2015. There is documentation that he's had endovenous ablation of bilateral varicose veins but we  have no documentation about this and this may be done at least 4-5 years ago. 01/22/2015 -- he has his vascular test is scheduled for this afternoon. 01/29/2015 -- the patient's vascular test was canceled last week because he was unable to get onto the examining bed at [facility]. His Unna's boot was not applied either and the patient had a dressing placed by home health. Today when his dressing was removed we found a lot of maggots in his right lower extremity. 02/07/2015 -- Was seen in the vascular office by the PA and she noted that a bilateral venous duplex study did not show any DVT, SVT or reflux bilaterally. The patient was advised to continue with Unna's boot and would at some stage to require graduated compression stockings of the 20-30 mmHg variety. In the future lymphedema pumps would also be considered. 02/14/2015 -- his dressing is smelling quite a bit and there is a discoloration of the wound suggestive of an [name], [name] ([id number]) infection. Deep tissue cultures will be taken today. 02/21/2015 -- the culture is back and it has growth moderate growth of Proteus and gram-negative rods sensitive to sulfa, ampicillin, cephazolin, Zosyn. He is already on doxycycline and his wound is looking clean and hence we will continue with this. 02/28/2015 -- he's been doing fine still on his anti-buttocks and has no fresh issues. 03/07/2015 -- I was told that because he lives in a nursing home the Apligraf was denied by his insurance company. 03/14/2015 -- we are awaiting samples and a trial of Puraply to be tried for his ulceration. 03/21/2015 -- his Apligraf was approved and he is here for his first application of Apligraf 03/28/2015 -- his wound has had quite a bit of secretion and needs to be changed and hence I will use the second application of Apligraf. 04/04/2015 -- he is here for a wound check but has a lot of secretions and hence his dressing needs to be taken down. 04/09/2015 --  he is here for his third application of Apligraf 04/18/2015 -- he is here for a wound check and though he does not have any overt infection he always has a foul odor to his leg. 04/25/2015 -- his wound has been doing much better and he has minimal drainage and the size is smaller. Of note he is on doxycycline. 05/02/2015 -- he is here for his fourth application of Apligraf Objective Constitutional Pulse regular. Respirations normal and  unlabored. Afebrile. Vitals Time Taken: 8:11 AM, Height: 68 in, Pulse: 110 bpm, Respiratory Rate: 18 breaths/min, Blood Pressure: 152/89 mmHg. Eyes Nonicteric. Reactive to light. Ears, Nose, Mouth, and Throat Lips, teeth, and gums WNL.Marland Kitchen Moist mucosa without lesions . Neck supple and nontender. No palpable supraclavicular or cervical adenopathy. Normal sized without goiter. Respiratory WNL. No retractions.. Cardiovascular Pedal Pulses WNL. No clubbing, cyanosis or edema. Lymphatic [name], [name] ([id number]) No adneopathy. No adenopathy. No adenopathy. Musculoskeletal Adexa without tenderness or enlargement.. Digits and nails w/o clubbing, cyanosis, infection, petechiae, ischemia, or inflammatory conditions.Marland Kitchen Psychiatric Judgement and insight Intact.. No evidence of depression, anxiety, or agitation.. General Notes: the wound looks clean, and there is no odor and has healthy granulation tissue in the edema is minimal. He will have his next application of Apligraf. Integumentary (Hair, Skin) No suspicious lesions. No crepitus or fluctuance. No peri-wound warmth or erythema. No masses.. Wound #5 status is Open. Original cause of wound was Gradually Appeared. The wound is located on the Right,Medial Lower Leg. The wound measures 8.5cm length x 5.2cm width x 0.1cm depth; 34.715cm^2 area and 3.471cm^3 volume. The wound is limited to skin breakdown. There is no tunneling or undermining noted. There is a large amount of serosanguineous drainage noted.  The wound margin is flat and intact. There is large (67-100%) red, pink granulation within the wound bed. There is no necrotic tissue within the wound bed. The periwound skin appearance exhibited: Localized Edema, Moist. The periwound skin appearance did not exhibit: Callus, Crepitus, Excoriation, Fluctuance, Friable, Induration, Rash, Scarring, Dry/Scaly, Maceration, Atrophie Blanche, Cyanosis, Ecchymosis, Hemosiderin Staining, Mottled, Pallor, Rubor, Erythema. Periwound temperature was noted as No Abnormality. 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 He may need some form or draw to ask to keep the area clean and dry. He will come back and see me next week for wound check. Procedures [name], [name] ([id number]) Wound #5 Wound #5 is a Venous Leg Ulcer located on the Right,Medial Lower Leg. A skin graft procedure using a bioengineered skin substitute/cellular or tissue based product was performed by Evlyn Kanner, MD. Apligraf was applied and secured with Steri-Strips. 44 sq cm of product was utilized and 0 sq cm was wasted. Post Application, mepitel was applied. A Time Out was conducted prior to the start of the procedure. The procedure was tolerated well with a pain level of 0 throughout and a pain level of 0 following the procedure. Post procedure Diagnosis Wound #5: Same as Pre-Procedure . Plan Wound Cleansing: Wound #5 Right,Medial Lower Leg: Cleanse wound with mild soap and water Skin Barriers/Peri-Wound Care: Wound #5 Right,Medial Lower Leg: Barrier  cream Primary Wound Dressing: Wound #5 Right,Medial Lower Leg: Foam Secondary Dressing: Wound #5 Right,Medial Lower Leg: ABD pad Dressing Change Frequency: Wound #5 Right,Medial Lower Leg: Change dressing every week Follow-up Appointments: Wound #5 Right,Medial Lower Leg: Return Appointment in 1 week. Edema Control: Wound #5 Right,Medial Lower Leg: 4-Layer Compression System - Right Lower Extremity - profore Advanced Therapies: Wound #5 Right,Medial Lower Leg: Apligraf application in clinic; including contact layer, fixation with steri strips, dry gauze and cover dressing. Medications-please add to medication list.:  Wound #5 Right,Medial Lower Leg: P.O. Antibiotics [name], [name] ([id number]) I have recommended his fourth application of Apligraf and will use the usual precautions to anchor it and bolstered with Steri-Strips and an appropriate secondary dressing. He may need some form or draw to ask to keep the area clean and dry. He will come back and see me next week for wound check. Electronic Signature(s) Signed: 05/02/2015 9:09:08 AM By: Evlyn Kanner MD, FACS Previous Signature: 05/02/2015 9:00:24 AM Version By: Evlyn Kanner MD, FACS Entered By: Evlyn Kanner on 05/02/2015 09:09:07 [name], [name] ([id number]) -------------------------------------------------------------------------------- SuperBill Details Patient Name: [name], BREON. Date of Service: 05/02/2015 Medical Record Number: [id number] Patient Account Number: [id number] Date of Birth/Sex: 01/18/1936 (79 y.o. Male) Treating RN: Huel Coventry Primary Care Physician: Terance Hart, DAVID Other Clinician: Referring Physician: Dorothey Baseman Treating Physician/Extender: Rudene Re in Treatment: 15 Diagnosis Coding ICD-10 Codes Code Description I87.311 Chronic venous hypertension (idiopathic) with ulcer of right lower extremity 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s of  muscle E66.01 Morbid (severe) obesity due to excess calories I89.0 Lymphedema, not elsewhere classified Facility Procedures CPT4 Code Description: 08657846 (Facility Use Only) Apligraf 1 SQ CM Modifier: Quantity: 44 CPT4 Code Description: 96295284 15271 - SKIN SUB GRAFT TRNK/ARM/LEG ICD-10 Description Diagnosis I87.311 Chronic venous hypertension (idiopathic) with ulcer of 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 E66.01 Morbid (severe) obesity  due to excess calories I89.0 Lymphedema, not elsewhere classified Modifier: right lower s of muscle Quantity: 1 extremity CPT4 Code Description: 13244010 15272 - SKIN SUB GRAFT T/A/L ADD-ON ICD-10 Description Diagnosis I87.311 Chronic venous hypertension (idiopathic) with ulcer of 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 E66.01 Morbid (severe) obesity  due to excess calories I89.0 Lymphedema, not elsewhere classified Modifier: right lower s of muscle Quantity: 1 extremity Physician Procedures CPT4 Code Description: [id number] 15271 - WC PHYS SKIN SUB GRAFT TRNK/ARM/LEG ICD-10 Description Diagnosis I87.311 Chronic venous hypertension (idiopathic) with ulcer of 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s AMITAI, DELAUGHTER  ([id number]) Modifier: right lower of muscle Quantity: 1 extremity Electronic Signature(s) Signed: 05/02/2015 9:09:29 AM By: Evlyn Kanner MD, FACS Entered By: Evlyn Kanner on 05/02/2015 95:63:87

## 2015-05-03 NOTE — Progress Notes (Signed)
AVANT, PRINTY ([id number]) Visit Report for 05/02/2015 Arrival Information Details Patient Name: [name], [name] Date of Service: 05/02/2015 8:00 AM Medical Record Number: [id number] Patient Account Number: [id number] Date of Birth/Sex: [birth year] (79 y.o. Male) Treating RN: Curtis Sites Primary Care Physician: Dorothey Baseman Other Clinician: Referring Physician: Terance Hart, DAVID Treating Physician/Extender: Rudene Re in Treatment: 15 Visit Information History Since Last Visit Added or deleted any medications: No Patient Arrived: Wheel Chair Any new allergies or adverse reactions: No Arrival Time: 08:05 Had a fall or experienced change in No activities of daily living that may affect Accompanied By: self risk of falls: Transfer Assistance: None Signs or symptoms of abuse/neglect since last No Patient Identification Verified: Yes visito Secondary Verification Process Yes Hospitalized since last visit: No Completed: Pain Present Now: No Patient Requires Transmission-Based No Precautions: Patient Has Alerts: No Electronic Signature(s) Signed: 05/02/2015 5:54:13 PM By: Curtis Sites Entered By: Curtis Sites on 05/02/2015 08:07:55 [name] ([id number]) -------------------------------------------------------------------------------- Encounter Discharge Information Details Patient Name: [name] Date of Service: 05/02/2015 8:00 AM Medical Record Number: [id number] Patient Account Number: [id number] Date of Birth/Sex: 1935-10-05 (79 y.o. Male) Treating RN: Huel Coventry Primary Care Physician: Terance Hart, DAVID Other Clinician: Referring Physician: Dorothey Baseman Treating Physician/Extender: Rudene Re in Treatment: 15 Encounter Discharge Information Items Discharge Pain Level: 0 Discharge Condition: Stable Ambulatory Status: Wheelchair Discharge Destination: Nursing Home Transportation: Private Auto Accompanied By:  self Schedule Follow-up Appointment: Yes Medication Reconciliation completed and provided to Patient/Care No Rolonda Pontarelli: Provided on Clinical Summary of Care: 05/02/2015 Form Type Recipient Paper Patient [facility] Electronic Signature(s) Signed: 05/02/2015 9:34:43 AM By: Curtis Sites Previous Signature: 05/02/2015 9:04:51 AM Version By: Gwenlyn Perking Entered By: Curtis Sites on 05/02/2015 09:34:43 Daschel, Roughton Chrissie Noa ([id number]) -------------------------------------------------------------------------------- Lower Extremity Assessment Details Patient Name: [name] Date of Service: 05/02/2015 8:00 AM Medical Record Number: [id number] Patient Account Number: [id number] Date of Birth/Sex: [birth year] (79 y.o. Male) Treating RN: Curtis Sites Primary Care Physician: Terance Hart, DAVID Other Clinician: Referring Physician: Terance Hart, DAVID Treating Physician/Extender: Rudene Re in Treatment: 15 Edema Assessment Assessed: [Left: No] Franne Forts: No] Edema: [Left: Ye] [Right: s] Calf Left: Right: Point of Measurement: 40 cm From Medial Instep cm 37.5 cm Ankle Left: Right: Point of Measurement: 8 cm From Medial Instep cm 28.5 cm Vascular Assessment Pulses: Posterior Tibial Dorsalis Pedis Palpable: [Right:Yes] Extremity colors, hair growth, and conditions: Extremity Color: [Right:Hyperpigmented] Hair Growth on Extremity: [Right:No] Temperature of Extremity: [Right:Warm] Capillary Refill: [Right:< 3 seconds] Toe Nail Assessment Left: Right: Thick: Yes Discolored: Yes Deformed: Yes Improper Length and Hygiene: No Electronic Signature(s) Signed: 05/02/2015 5:54:13 PM By: Curtis Sites Entered By: Curtis Sites on 05/02/2015 08:17:48 [name] ([id number]) -------------------------------------------------------------------------------- Multi Wound Chart Details Patient Name: [name] Date of Service: 05/02/2015 8:00 AM Medical Record Number:  [id number] Patient Account Number: [id number] Date of Birth/Sex: January 16, 1936 (79 y.o. Male) Treating RN: Curtis Sites Primary Care Physician: Dorothey Baseman Other Clinician: Referring Physician: Terance Hart, DAVID Treating Physician/Extender: Rudene Re in Treatment: 15 Vital Signs Height(in): 68 Pulse(bpm): 110 Weight(lbs): Blood Pressure 152/89 (mmHg): Body Mass Index(BMI): Temperature(F): Respiratory Rate 18 (breaths/min): Photos: [5:No Photos] [N/A:N/A] Wound Location: [5:Right Lower Leg - Medial] [N/A:N/A] Wounding Event: [5:Gradually Appeared] [N/A:N/A] Primary Etiology: [5:Venous Leg Ulcer] [N/A:N/A] Comorbid History: [5:Cataracts, Anemia, Hypertension, Osteoarthritis] [N/A:N/A] Date Acquired: [5:11/13/2014] [N/A:N/A] Weeks of Treatment: [5:15] [N/A:N/A] Wound Status: [5:Open] [N/A:N/A] Measurements L x W x D 8.5x5.2x0.1 [N/A:N/A] (cm) Area (cm) : [5:34.715] [N/A:N/A] Volume (cm) : [5:3.471] [N/A:N/A] % Reduction  in Area: [5:66.10%] [N/A:N/A] % Reduction in Volume: 88.70% [N/A:N/A] Classification: [5:Full Thickness Without Exposed Support Structures] [N/A:N/A] Exudate Amount: [5:Large] [N/A:N/A] Exudate Type: [5:Serosanguineous] [N/A:N/A] Exudate Color: [5:red, brown] [N/A:N/A] Foul Odor After [5:Yes] [N/A:N/A] Cleansing: Odor Anticipated Due to No [N/A:N/A] Product Use: Wound Margin: [5:Flat and Intact] [N/A:N/A] Granulation Amount: [5:Large (67-100%)] [N/A:N/A] Granulation Quality: [5:Red, Pink] [N/A:N/A] Necrotic Amount: [5:None Present (0%)] [N/A:N/A] Exposed Structures: Fascia: No N/A N/A Fat: No Tendon: No Muscle: No Joint: No Bone: No Limited to Skin Breakdown Epithelialization: Small (1-33%) N/A N/A Periwound Skin Texture: Edema: Yes N/A N/A Excoriation: No Induration: No Callus: No Crepitus: No Fluctuance: No Friable: No Rash: No Scarring: No Periwound Skin Moist: Yes N/A N/A Moisture: Maceration: No Dry/Scaly:  No Periwound Skin Color: Atrophie Blanche: No N/A N/A Cyanosis: No Ecchymosis: No Erythema: No Hemosiderin Staining: No Mottled: No Pallor: No Rubor: No Temperature: No Abnormality N/A N/A Tenderness on No N/A N/A Palpation: Wound Preparation: Ulcer Cleansing: Other: N/A N/A soap and water Topical Anesthetic Applied: Other: lidocaine 4% Treatment Notes Electronic Signature(s) Signed: 05/02/2015 5:54:13 PM By: Curtis Sites Entered By: Curtis Sites on 05/02/2015 08:28:39 DARTANIAN, KNAGGS ([id number]) -------------------------------------------------------------------------------- Multi-Disciplinary Care Plan Details Patient Name: NOELL, SHULAR. Date of Service: 05/02/2015 8:00 AM Medical Record Number: [id number] Patient Account Number: [id number] Date of Birth/Sex: 10-07-1935 (79 y.o. Male) Treating RN: Curtis Sites Primary Care Physician: Dorothey Baseman Other Clinician: Referring Physician: Dorothey Baseman Treating Physician/Extender: Rudene Re in Treatment: 15 Active Inactive Orientation to the Wound Care Program Nursing Diagnoses: Knowledge deficit related to the wound healing center program Goals: Patient/caregiver will verbalize understanding of the Wound Healing Center Program Date Initiated: 01/15/2015 Goal Status: Active Interventions: Provide education on orientation to the wound center Notes: Venous Leg Ulcer Nursing Diagnoses: Knowledge deficit related to disease process and management Potential for venous Insuffiency (use before diagnosis confirmed) Goals: Patient will maintain optimal edema control Date Initiated: 01/15/2015 Goal Status: Active Patient/caregiver will verbalize understanding of disease process and disease management Date Initiated: 01/15/2015 Goal Status: Active Verify adequate tissue perfusion prior to therapeutic compression application Date Initiated: 01/15/2015 Goal Status: Active Interventions: Assess  peripheral edema status every visit. Compression as ordered Provide education on venous insufficiency TORRELL, KRUTZ ([id number]) Treatment Activities: Test ordered outside of clinic : 05/02/2015 Therapeutic compression applied : 05/02/2015 Venous Duplex Doppler : 05/02/2015 Notes: Wound/Skin Impairment Nursing Diagnoses: Impaired tissue integrity Knowledge deficit related to ulceration/compromised skin integrity Goals: Patient/caregiver will verbalize understanding of skin care regimen Date Initiated: 01/15/2015 Goal Status: Active Ulcer/skin breakdown will have a volume reduction of 30% by week 4 Date Initiated: 01/15/2015 Goal Status: Active Ulcer/skin breakdown will have a volume reduction of 50% by week 8 Date Initiated: 01/15/2015 Goal Status: Active Ulcer/skin breakdown will have a volume reduction of 80% by week 12 Date Initiated: 01/15/2015 Goal Status: Active Ulcer/skin breakdown will heal within 14 weeks Date Initiated: 01/15/2015 Goal Status: Active Interventions: Assess patient/caregiver ability to perform ulcer/skin care regimen upon admission and as needed Assess ulceration(s) every visit Provide education on ulcer and skin care Notes: Electronic Signature(s) Signed: 05/02/2015 5:54:13 PM By: Curtis Sites Entered By: Curtis Sites on 05/02/2015 08:28:33 [name] ([id number]) -------------------------------------------------------------------------------- Patient/Caregiver Education Details Patient Name: [name] Date of Service: 05/02/2015 8:00 AM Medical Record Number: [id number] Patient Account Number: [id number] Date of Birth/Gender: [birth year] (79 y.o. Male) Treating RN: Curtis Sites Primary Care Physician: Dorothey Baseman Other Clinician: Referring Physician: Dorothey Baseman Treating Physician/Extender: Rudene Re in Treatment: 15 Education  Assessment Education Provided To: Patient Education Topics  Provided Venous: Handouts: Other: call if wrap needs to be changed and [facility] can see you to change wrap Methods: Explain/Verbal Responses: State content correctly Electronic Signature(s) Signed: 05/02/2015 9:35:13 AM By: Curtis Sites Entered By: Curtis Sites on 05/02/2015 09:35:13 [name], [name] Chrissie Noa ([id number]) -------------------------------------------------------------------------------- Wound Assessment Details Patient Name: [name] Date of Service: 05/02/2015 8:00 AM Medical Record Number: [id number] Patient Account Number: [id number] Date of Birth/Sex: 19-Mar-1936 (79 y.o. Male) Treating RN: Curtis Sites Primary Care Physician: Terance Hart, DAVID Other Clinician: Referring Physician: Terance Hart, DAVID Treating Physician/Extender: Rudene Re in Treatment: 15 Wound Status Wound Number: 5 Primary Venous Leg Ulcer Etiology: Wound Location: Right Lower Leg - Medial Wound Status: Open Wounding Event: Gradually Appeared Comorbid Cataracts, Anemia, Hypertension, Date Acquired: 11/13/2014 History: Osteoarthritis Weeks Of Treatment: 15 Clustered Wound: No Photos Photo Uploaded By: Curtis Sites on 05/02/2015 16:52:38 Wound Measurements Length: (cm) 8.5 Width: (cm) 5.2 Depth: (cm) 0.1 Area: (cm) 34.715 Volume: (cm) 3.471 % Reduction in Area: 66.1% % Reduction in Volume: 88.7% Epithelialization: Small (1-33%) Tunneling: No Undermining: No Wound Description Full Thickness Without Exposed Classification: Support Structures Wound Margin: Flat and Intact Exudate Large Amount: Exudate Type: Serosanguineous Exudate Color: red, brown Foul Odor After Cleansing: Yes Due to Product Use: No Wound Bed Granulation Amount: Large (67-100%) Exposed Structure Granulation Quality: Red, Pink Fascia Exposed: No Necrotic Amount: None Present (0%) Fat Layer Exposed: No KOLBY, SCHARA ([id number]) Tendon Exposed: No Muscle Exposed: No Joint Exposed:  No Bone Exposed: No Limited to Skin Breakdown Periwound Skin Texture Texture Color No Abnormalities Noted: No No Abnormalities Noted: No Callus: No Atrophie Blanche: No Crepitus: No Cyanosis: No Excoriation: No Ecchymosis: No Fluctuance: No Erythema: No Friable: No Hemosiderin Staining: No Induration: No Mottled: No Localized Edema: Yes Pallor: No Rash: No Rubor: No Scarring: No Temperature / Pain Moisture Temperature: No Abnormality No Abnormalities Noted: No Dry / Scaly: No Maceration: No Moist: Yes Wound Preparation Ulcer Cleansing: Other: soap and water, Topical Anesthetic Applied: Other: lidocaine 4%, Treatment Notes Wound #5 (Right, Medial Lower Leg) 1. Cleansed with: Cleanse wound with antibacterial soap and water 2. Anesthetic Topical Lidocaine 4% cream to wound bed prior to debridement 3. Peri-wound Care: Skin Prep 4. Dressing Applied: Foam Other dressing (specify in notes) 5. Secondary Dressing Applied ABD Pad 7. Secured with 4-Layer Compression System - Right Lower Extremity Notes apligraf applied by Dr Meyer Russel today KYLLE, LALL ([id number]) Electronic Signature(s) Signed: 05/02/2015 5:54:13 PM By: Curtis Sites Entered By: Curtis Sites on 05/02/2015 08:24:22 HASANI, DIEMER ([id number]) -------------------------------------------------------------------------------- Vitals Details Patient Name: VIGNESH, WILLERT. Date of Service: 05/02/2015 8:00 AM Medical Record Number: [id number] Patient Account Number: [id number] Date of Birth/Sex: September 14, 1935 (79 y.o. Male) Treating RN: Curtis Sites Primary Care Physician: Dorothey Baseman Other Clinician: Referring Physician: Terance Hart, DAVID Treating Physician/Extender: Rudene Re in Treatment: 15 Vital Signs Time Taken: 08:11 Pulse (bpm): 110 Height (in): 68 Respiratory Rate (breaths/min): 18 Blood Pressure (mmHg): 152/89 Reference Range: 80 - 120 mg / dl Electronic  Signature(s) Signed: 05/02/2015 5:54:13 PM By: Curtis Sites Entered By: Curtis Sites on 05/02/2015 08:11:41

## 2015-05-09 ENCOUNTER — Encounter: Payer: Medicare Other | Attending: Surgery | Admitting: Surgery

## 2015-05-09 DIAGNOSIS — I87311 Chronic venous hypertension (idiopathic) with ulcer of right lower extremity: Secondary | ICD-10-CM | POA: Insufficient documentation

## 2015-05-09 DIAGNOSIS — I890 Lymphedema, not elsewhere classified: Secondary | ICD-10-CM | POA: Diagnosis not present

## 2015-05-09 DIAGNOSIS — I10 Essential (primary) hypertension: Secondary | ICD-10-CM | POA: Diagnosis not present

## 2015-05-09 DIAGNOSIS — G35 Multiple sclerosis: Secondary | ICD-10-CM | POA: Insufficient documentation

## 2015-05-09 DIAGNOSIS — L97313 Non-pressure chronic ulcer of right ankle with necrosis of muscle: Secondary | ICD-10-CM | POA: Insufficient documentation

## 2015-05-10 NOTE — Progress Notes (Signed)
[name], [name] ([id number]) Visit Report for 05/09/2015 Chief Complaint Document Details Patient Name: [name], [name] Date of Service: 05/09/2015 8:00 AM Medical Record Number: [id number] Patient Account Number: [id number] Date of Birth/Sex: 1936-06-02 (79 y.o. Male) Treating RN: Curtis Sites Primary Care Physician: Dorothey Baseman Other Clinician: Referring Physician: Dorothey Baseman Treating Physician/Extender: Rudene Re in Treatment: 16 Information Obtained from: Patient Chief Complaint Patient presents for treatment of an open ulcer due to venous insufficiency. The patient has had a open wound on his right medial ankle for at least 3 years and was last seen in the wound clinic in February 2015. He was lost to follow-up after that. Electronic Signature(s) Signed: 05/09/2015 8:46:34 AM By: Evlyn Kanner MD, FACS Entered By: Evlyn Kanner on 05/09/2015 08:46:34 [name], [name] ([id number]) -------------------------------------------------------------------------------- HPI Details Patient Name: [name], [name] Date of Service: 05/09/2015 8:00 AM Medical Record Number: [id number] Patient Account Number: [id number] Date of Birth/Sex: Dec 25, 1935 (79 y.o. Male) Treating RN: Curtis Sites Primary Care Physician: Dorothey Baseman Other Clinician: Referring Physician: Terance Hart, DAVID Treating Physician/Extender: Rudene Re in Treatment: 16 History of Present Illness Location: right lower extremity ulceration Quality: Patient reports experiencing a dull pain to affected area(s). Severity: Patient states wound are getting worse. 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 Modifying Factors: Consults to this date include:has received treatment in the wound center in the past but stopped coming here since February 2015 Associated Signs and Symptoms: Patient reports having difficulty standing for long periods. HPI Description: 79 year old male who is known to have progressive weakness and has been in a nursing home for a while has had chronic lower extremity edema both legs and a large open wound on the right lower extremity which is has at least for about 3-4 years. The patient was seen in the wound clinic before and has been treated until February 2015 when he was lost to follow-up. Past medical history is significant for hypertension, gout, venous stasis ulcers, Parkinson's [name], constipation. He's not been a diabetic but his last hemoglobin A1c was 6 in March 2015. There is documentation that he's had endovenous ablation of bilateral varicose veins but we have no documentation about this and this may be done at least 4-5 years ago. 01/22/2015 -- he has his vascular test is scheduled for this afternoon. 01/29/2015 -- the patient's vascular test was canceled last week because he was unable to get onto the examining bed at [facility]. His Unna's boot was not applied either and the patient had a dressing placed by home health. Today when his dressing was removed we found a lot of maggots in his right lower extremity. 02/07/2015 -- Was seen in the vascular office by the PA and she noted that a bilateral venous duplex study did not show any DVT, SVT or reflux bilaterally. The patient was advised to continue with Unna's boot and would at some stage to require graduated compression stockings of the 20-30 mmHg variety. In the future lymphedema pumps would also be considered. 02/14/2015 -- his dressing is smelling quite a bit and there is a discoloration of the wound suggestive of an infection. Deep tissue cultures will be taken today. 02/21/2015 --  the culture  is back and it has growth moderate growth of Proteus and gram-negative rods sensitive to sulfa, ampicillin, cephazolin, Zosyn. He is already on doxycycline and his wound is looking clean and hence we will continue with this. 02/28/2015 -- he's been doing fine still on his anti-buttocks and has no fresh issues. 03/07/2015 -- I was told that because he lives in a nursing home the Apligraf was denied by his insurance company. 03/14/2015 -- we are awaiting samples and a trial of Puraply to be tried for his ulceration. 03/21/2015 -- his Apligraf was approved and he is here for his first application of Apligraf 03/28/2015 -- his wound has had quite a bit of secretion and needs to be changed and hence I will use the WREN, GALLAGA. ([id number]) second application of Apligraf. 04/04/2015 -- he is here for a wound check but has a lot of secretions and hence his dressing needs to be taken down. 04/09/2015 -- he is here for his third application of Apligraf 04/18/2015 -- he is here for a wound check and though he does not have any overt infection he always has a foul odor to his leg. 04/25/2015 -- his wound has been doing much better and he has minimal drainage and the size is smaller. Of note he is on doxycycline. 05/02/2015 -- he is here for his fourth application of Apligraf 05/09/2015 -- he is here for a wound check and is bothered by a lot of odor from the wound. Electronic Signature(s) Signed: 05/09/2015 8:46:58 AM By: Evlyn Kanner MD, FACS Entered By: Evlyn Kanner on 05/09/2015 08:46:58 [name], [name] ([id number]) -------------------------------------------------------------------------------- Physical Exam Details Patient Name: [name], [name] Date of Service: 05/09/2015 8:00 AM Medical Record Number: [id number] Patient Account Number: [id number] Date of Birth/Sex: 1935-08-18 (79 y.o. Male) Treating RN: Curtis Sites Primary Care Physician: Dorothey Baseman Other  Clinician: Referring Physician: Terance Hart, DAVID Treating Physician/Extender: Rudene Re in Treatment: 16 Constitutional . Pulse regular. Respirations normal and unlabored. Afebrile. . Eyes Nonicteric. Reactive to light. Ears, Nose, Mouth, and Throat Lips, teeth, and gums WNL.Marland Kitchen Moist mucosa without lesions . Neck supple and nontender. No palpable supraclavicular or cervical adenopathy. Normal sized without goiter. Respiratory WNL. No retractions.. Cardiovascular Pedal Pulses WNL. No clubbing, cyanosis or edema. Lymphatic No adneopathy. No adenopathy. No adenopathy. Musculoskeletal Adexa without tenderness or enlargement.. Digits and nails w/o clubbing, cyanosis, infection, petechiae, ischemia, or inflammatory conditions.. Integumentary (Hair, Skin) No suspicious lesions. No crepitus or fluctuance. No peri-wound warmth or erythema. No masses.Marland Kitchen Psychiatric Judgement and insight Intact.. No evidence of depression, anxiety, or agitation.. Notes I have taken down his dressing and washed his wound nicely with saline and it looks excellent. The edema is also minimal. Electronic Signature(s) Signed: 05/09/2015 8:47:30 AM By: Evlyn Kanner MD, FACS Entered By: Evlyn Kanner on 05/09/2015 08:47:30 [name], [name] ([id number]) -------------------------------------------------------------------------------- Physician Orders Details Patient Name: [name], [name] Date of Service: 05/09/2015 8:00 AM Medical Record Number: [id number] Patient Account Number: [id number] Date of Birth/Sex: [birth year] (79 y.o. Male) Treating RN: Curtis Sites Primary Care Physician: Dorothey Baseman Other Clinician: Referring Physician: Dorothey Baseman Treating Physician/Extender: Rudene Re in Treatment: 24 Verbal / Phone Orders: Yes Clinician: Curtis Sites Read Back and Verified: Yes Diagnosis Coding Wound Cleansing Wound #5 Right,Medial Lower Leg o Cleanse wound with mild  soap and water Skin Barriers/Peri-Wound Care Wound #5 Right,Medial Lower Leg o Barrier cream Primary Wound Dressing Wound #5 Right,Medial Lower Leg o Other: - sorbact Secondary Dressing Wound #5  Right,Medial Lower Leg o Drawtex - and carboflex Dressing Change Frequency Wound #5 Right,Medial Lower Leg o Other: - Monday and Thursday Follow-up Appointments Wound #5 Right,Medial Lower Leg o Return Appointment in 1 week. o Nurse Visit as needed - Monday Edema Control Wound #5 Right,Medial Lower Leg o 4-Layer Compression System - Right Lower Extremity - profore Medications-please add to medication list. Wound #5 Right,Medial Lower Leg o P.O. Antibiotics - doxycycline for 2 weeks [name], [name] ([id number]) Notes order apligraf for next visit Electronic Signature(s) Signed: 05/09/2015 3:36:42 PM By: Evlyn Kanner MD, FACS Signed: 05/09/2015 5:32:28 PM By: Curtis Sites Entered By: Curtis Sites on 05/09/2015 08:42:21 JANDIEL, MAGALLANES ([id number]) -------------------------------------------------------------------------------- Problem List Details Patient Name: [name], [name] Date of Service: 05/09/2015 8:00 AM Medical Record Number: [id number] Patient Account Number: [id number] Date of Birth/Sex: Nov 12, 1935 (79 y.o. 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 Date of Service: 05/09/2015 8:00 AM Medical Record Number: [id number] Patient Account Number: [id number] Date of Birth/Sex: 11-21-1935 (79 y.o. Male) Treating RN: Curtis Sites Primary Care Physician: Dorothey Baseman Other Clinician: Referring Physician: Dorothey Baseman Treating Physician/Extender: Rudene Re in Treatment: 16 Subjective Chief Complaint Information obtained from Patient Patient presents for treatment of an open ulcer due to venous insufficiency. The patient has had a open wound on his right medial ankle for at least 3 years and was last seen in the wound clinic in February 2015. He was lost to follow-up after that. History of Present Illness (HPI) The following HPI elements were documented for the patient's wound: Location: right lower extremity ulceration Quality: Patient reports experiencing a dull pain to affected area(s). Severity: Patient states wound are getting worse. 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 Modifying Factors: Consults to this date include:has received treatment in the wound center in the past but stopped coming here since February 2015 Associated Signs and Symptoms: Patient reports having difficulty standing for long periods. 79 year old male who is known to have progressive weakness and has been in a nursing home for a while has had chronic lower extremity edema  both legs and a large open wound on the right lower extremity which is has at least for about 3-4 years. The patient was seen in the wound clinic before and has been treated until February 2015 when he was lost to follow-up. Past medical history is significant for hypertension, gout, venous stasis ulcers, Parkinson's [name], constipation. He's not been a diabetic but his last hemoglobin A1c was 6 in March 2015. There is documentation that he's had endovenous ablation of bilateral varicose veins  but we have no documentation about this and this may be done at least 4-5 years ago. 01/22/2015 -- he has his vascular test is scheduled for this afternoon. 01/29/2015 -- the patient's vascular test was canceled last week because he was unable to get onto the examining bed at [facility]. His Unna's boot was not applied either and the patient had a dressing placed by home health. Today when his dressing was removed we found a lot of maggots in his right lower extremity. 02/07/2015 -- Was seen in the vascular office by the PA and she noted that a bilateral venous duplex study did not show any DVT, SVT or reflux bilaterally. The patient was advised to continue with Unna's boot and would at some stage to require graduated compression stockings of the 20-30 mmHg variety. In the future lymphedema pumps would also be considered. 02/14/2015 -- his dressing is smelling quite a bit and there is a discoloration of the wound suggestive of an [name], [name] ([id number]) infection. Deep tissue cultures will be taken today. 02/21/2015 -- the culture is back and it has growth moderate growth of Proteus and gram-negative rods sensitive to sulfa, ampicillin, cephazolin, Zosyn. He is already on doxycycline and his wound is looking clean and hence we will continue with this. 02/28/2015 -- he's been doing fine still on his anti-buttocks and has no fresh issues. 03/07/2015 -- I was told that because he lives in a nursing  home the Apligraf was denied by his insurance company. 03/14/2015 -- we are awaiting samples and a trial of Puraply to be tried for his ulceration. 03/21/2015 -- his Apligraf was approved and he is here for his first application of Apligraf 03/28/2015 -- his wound has had quite a bit of secretion and needs to be changed and hence I will use the second application of Apligraf. 04/04/2015 -- he is here for a wound check but has a lot of secretions and hence his dressing needs to be taken down. 04/09/2015 -- he is here for his third application of Apligraf 04/18/2015 -- he is here for a wound check and though he does not have any overt infection he always has a foul odor to his leg. 04/25/2015 -- his wound has been doing much better and he has minimal drainage and the size is smaller. Of note he is on doxycycline. 05/02/2015 -- he is here for his fourth application of Apligraf 05/09/2015 -- he is here for a wound check and is bothered by a lot of odor from the wound. Objective Constitutional Pulse regular. Respirations normal and unlabored. Afebrile. Vitals Time Taken: 8:19 AM, Height: 68 in, Temperature: 98.1 F, Pulse: 63 bpm, Respiratory Rate: 18 breaths/min, Blood Pressure: 143/87 mmHg. Eyes Nonicteric. Reactive to light. Ears, Nose, Mouth, and Throat Lips, teeth, and gums WNL.Marland Kitchen Moist mucosa without lesions . Neck supple and nontender. No palpable supraclavicular or cervical adenopathy. Normal sized without goiter. Respiratory WNL. No retractions.. Cardiovascular Pedal Pulses WNL. No clubbing, cyanosis or edema. JARID, GARRETSON ([id number]) Lymphatic No adneopathy. No adenopathy. No adenopathy. Musculoskeletal Adexa without tenderness or enlargement.. Digits and nails w/o clubbing, cyanosis, infection, petechiae, ischemia, or inflammatory conditions.Marland Kitchen Psychiatric Judgement and insight Intact.. No evidence of depression, anxiety, or agitation.. General Notes: I have taken  down his dressing and washed his wound nicely with saline and it looks excellent. The edema is also minimal. Integumentary (Hair, Skin) No suspicious lesions. No crepitus or fluctuance. No peri-wound warmth or erythema. No masses.. Wound #5 status is Open.  Original cause of wound was Gradually Appeared. The wound is located on the Right,Medial Lower Leg. The wound measures 8.5cm length x 5.2cm width x 0.1cm depth; 34.715cm^2 area and 3.471cm^3 volume. The wound is limited to skin breakdown. There is no tunneling or undermining noted. There is a large amount of serosanguineous drainage noted. The wound margin is flat and intact. There is large (67-100%) red, pink granulation within the wound bed. There is no necrotic tissue within the wound bed. The periwound skin appearance exhibited: Localized Edema, Moist. The periwound skin appearance did not exhibit: Callus, Crepitus, Excoriation, Fluctuance, Friable, Induration, Rash, Scarring, Dry/Scaly, Maceration, Atrophie Blanche, Cyanosis, Ecchymosis, Hemosiderin Staining, Mottled, Pallor, Rubor, Erythema. Periwound temperature was noted as No Abnormality. 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 They will use a layer of Sorbact,drawtext and then Carboflex. 2. we will continue with the 4-layer Profore wrap. GANNON, HEINZMAN ([id number]) 3. He will continue with doxycycline 100 mg by mouth twice a day for 14 days 4. Apply his last application of Apligraf next week. Plan Wound Cleansing: Wound #5 Right,Medial Lower Leg: Cleanse wound with mild soap and water Skin  Barriers/Peri-Wound Care: Wound #5 Right,Medial Lower Leg: Barrier cream Primary Wound Dressing: Wound #5 Right,Medial Lower Leg: Other: - sorbact Secondary Dressing: Wound #5 Right,Medial Lower Leg: Drawtex - and carboflex Dressing Change Frequency: Wound #5 Right,Medial Lower Leg: Other: - Monday and Thursday Follow-up Appointments: Wound #5 Right,Medial Lower Leg: Return Appointment in 1 week. Nurse Visit as needed - Monday Edema Control: Wound #5 Right,Medial Lower Leg: 4-Layer Compression System - Right Lower Extremity - profore Medications-please add to medication list.: Wound #5 Right,Medial Lower Leg: P.O. Antibiotics - doxycycline for 2 weeks General Notes: order apligraf for next visit Due to the significant odor from the wound his dressing was taken down and I have recommended the following: 1. They will use a layer of Sorbact,drawtext and then Carboflex. 2. we will continue with the 4-layer Profore wrap. 3. He will continue with doxycycline 100 mg by mouth twice a day for 14 days 4. Apply his last application of Apligraf next week. Electronic Signature(s) Signed: 05/09/2015 8:49:42 AM By: Evlyn Kanner MD, FACS Entered By: Evlyn Kanner on 05/09/2015 08:49:42 ROCKWELL, ZENTZ ([id number]) YADER, CRIGER ([id number]) -------------------------------------------------------------------------------- SuperBill Details Patient Name: CASSEY, HURRELL. Date of Service: 05/09/2015 Medical Record Number: [id number] Patient Account Number: [id number] Date of Birth/Sex: [birth year] (79 y.o. Male) Treating RN: Curtis Sites Primary Care Physician: Terance Hart, DAVID Other Clinician: Referring Physician: Terance Hart, DAVID Treating Physician/Extender: Rudene Re in Treatment: 16 Diagnosis Coding ICD-10 Codes Code Description I87.311 Chronic venous hypertension (idiopathic) with ulcer of right lower extremity L97.313 Non-pressure chronic ulcer of right ankle with  necrosis of muscle E66.01 Morbid (severe) obesity due to excess calories I89.0 Lymphedema, not elsewhere classified Facility Procedures CPT4: Description Modifier Quantity Code [id number] (Facility Use Only) [phone] - APPLY MULTLAY COMPRS LWR RT 1 LEG Physician Procedures CPT4 Code Description: [id number] - WC PHYS LEVEL 3 - EST PT ICD-10 Description Diagnosis I87.311 Chronic venous hypertension (idiopathic) with ulcer of 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 E66.01 Morbid (severe) obesity due  to excess calories  I89.0 Lymphedema, not elsewhere classified Modifier: right lower s of muscle Quantity: 1 extremity Electronic Signature(s) Signed: 05/09/2015 8:50:04 AM By: Evlyn Kanner MD, FACS Previous Signature: 05/09/2015 8:27:46 AM Version By: Curtis Sites Entered By: Evlyn Kanner on 05/09/2015 08:50:04

## 2015-05-10 NOTE — Progress Notes (Signed)
HUNTRUCKER, PRIDGEON([id number]) Visit Report for 05/09/2015 Arrival Information Details Patient Name: [name], [name] Date of Service: 05/09/2015 8:00 AM Medical Record Number: [id number] Patient Account Number: [id number] Date of Birth/Sex: [birth year] (79 y.o. Male) Treating RN: Curtis Sites Primary Care Physician: Dorothey Baseman Other Clinician: Referring Physician: Terance Hart, DAVID Treating Physician/Extender: Rudene Re in Treatment: 16 Visit Information History Since Last Visit Added or deleted any medications: No Patient Arrived: Wheel Chair Any new allergies or adverse reactions: No Arrival Time: 08:05 Had a fall or experienced change in No activities of daily living that may affect Accompanied By: self risk of falls: Transfer Assistance: None Signs or symptoms of abuse/neglect since last No Patient Identification Verified: Yes visito Secondary Verification Process Yes Hospitalized since last visit: No Completed: Pain Present Now: No Patient Requires Transmission-Based No Precautions: Patient Has Alerts: No Electronic Signature(s) Signed: 05/09/2015 5:32:28 PM By: Curtis Sites Entered By: Curtis Sites on 05/09/2015 08:17:55 [name] ([id number]) -------------------------------------------------------------------------------- Encounter Discharge Information Details Patient Name: [name] Date of Service: 05/09/2015 8:00 AM Medical Record Number: [id number] Patient Account Number: [id number] Date of Birth/Sex: 09-20-1935 (79 y.o. Male) Treating RN: Curtis Sites Primary Care Physician: Dorothey Baseman Other Clinician: Referring Physician: Dorothey Baseman Treating Physician/Extender: Rudene Re in Treatment: 16 Encounter Discharge Information Items Discharge Pain Level: 0 Discharge Condition: Stable Ambulatory Status: Wheelchair Discharge Destination: Nursing Home Transportation: Private Auto Accompanied By:  self Schedule Follow-up Appointment: Yes Medication Reconciliation completed and provided to Patient/Care No [name]: Provided on Clinical Summary of Care: 05/09/2015 Form Type Recipient Paper Patient [facility] Electronic Signature(s) Signed: 05/09/2015 9:39:55 AM By: Curtis Sites Previous Signature: 05/09/2015 8:59:21 AM Version By: Gwenlyn Perking Entered By: Curtis Sites on 05/09/2015 09:39:55 [name] ([id number]) -------------------------------------------------------------------------------- Lower Extremity Assessment Details Patient Name: [name] Date of Service: 05/09/2015 8:00 AM Medical Record Number: [id number] Patient Account Number: [id number] Date of Birth/Sex: 08-Oct-1935 (79 y.o. Male) Treating RN: Curtis Sites Primary Care Physician: Terance Hart, DAVID Other Clinician: Referring Physician: Terance Hart, DAVID Treating Physician/Extender: Rudene Re in Treatment: 16 Edema Assessment Assessed: [Left: No] Franne Forts: No] Edema: [Left: Ye] [Right: s] Calf Left: Right: Point of Measurement: 40 cm From Medial Instep cm 37.9 cm Ankle Left: Right: Point of Measurement: 8 cm From Medial Instep cm 28.7 cm Vascular Assessment Pulses: Posterior Tibial Dorsalis Pedis Palpable: [Right:Yes] Extremity colors, hair growth, and conditions: Extremity Color: [Right:Hyperpigmented] Hair Growth on Extremity: [Right:No] Temperature of Extremity: [Right:Warm] Capillary Refill: [Right:< 3 seconds] Toe Nail Assessment Left: Right: Thick: Yes Discolored: Yes Deformed: Yes Improper Length and Hygiene: No Electronic Signature(s) Signed: 05/09/2015 5:32:28 PM By: Curtis Sites Entered By: Curtis Sites on 05/09/2015 08:18:52 [name] ([id number]) -------------------------------------------------------------------------------- Multi Wound Chart Details Patient Name: [name] Date of Service: 05/09/2015 8:00 AM Medical Record Number:  [id number] Patient Account Number: [id number] Date of Birth/Sex: 1936-03-22 (79 y.o. Male) Treating RN: Curtis Sites Primary Care Physician: Dorothey Baseman Other Clinician: Referring Physician: Terance Hart, DAVID Treating Physician/Extender: Rudene Re in Treatment: 16 Vital Signs Height(in): 68 Pulse(bpm): 63 Weight(lbs): Blood Pressure 143/87 (mmHg): Body Mass Index(BMI): Temperature(F): 98.1 Respiratory Rate 18 (breaths/min): Photos: [5:No Photos] [N/A:N/A] Wound Location: [5:Right, Medial Lower Leg] [N/A:N/A] Wounding Event: [5:Gradually Appeared] [N/A:N/A] Primary Etiology: [5:Venous Leg Ulcer] [N/A:N/A] Comorbid History: [5:Cataracts, Anemia, Hypertension, Osteoarthritis] [N/A:N/A] Date Acquired: [5:11/13/2014] [N/A:N/A] Weeks of Treatment: [5:16] [N/A:N/A] Wound Status: [5:Open] [N/A:N/A] Measurements L x W x D 8.5x5.2x0.1 [N/A:N/A] (cm) Area (cm) : [5:34.715] [N/A:N/A] Volume (cm) : [5:3.471] [N/A:N/A] % Reduction  in Area: [5:66.10%] [N/A:N/A] % Reduction in Volume: 88.70% [N/A:N/A] Classification: [5:Full Thickness Without Exposed Support Structures] [N/A:N/A] Exudate Amount: [5:Large] [N/A:N/A] Exudate Type: [5:Serosanguineous] [N/A:N/A] Exudate Color: [5:red, brown] [N/A:N/A] Foul Odor After [5:Yes] [N/A:N/A] Cleansing: Odor Anticipated Due to No [N/A:N/A] Product Use: Wound Margin: [5:Flat and Intact] [N/A:N/A] Granulation Amount: [5:Large (67-100%)] [N/A:N/A] Granulation Quality: [5:Red, Pink] [N/A:N/A] Necrotic Amount: [5:None Present (0%)] [N/A:N/A] Exposed Structures: Fascia: No N/A N/A Fat: No Tendon: No Muscle: No Joint: No Bone: No Limited to Skin Breakdown Epithelialization: Small (1-33%) N/A N/A Periwound Skin Texture: Edema: Yes N/A N/A Excoriation: No Induration: No Callus: No Crepitus: No Fluctuance: No Friable: No Rash: No Scarring: No Periwound Skin Moist: Yes N/A N/A Moisture: Maceration: No Dry/Scaly:  No Periwound Skin Color: Atrophie Blanche: No N/A N/A Cyanosis: No Ecchymosis: No Erythema: No Hemosiderin Staining: No Mottled: No Pallor: No Rubor: No Temperature: No Abnormality N/A N/A Tenderness on No N/A N/A Palpation: Wound Preparation: Ulcer Cleansing: Other: N/A N/A soap and water Topical Anesthetic Applied: None Treatment Notes Electronic Signature(s) Signed: 05/09/2015 8:27:24 AM By: Curtis Sites Entered By: Curtis Sites on 05/09/2015 08:27:24 [name] ([id number]) -------------------------------------------------------------------------------- Multi-Disciplinary Care Plan Details Patient Name: JERYN, GALLIGHER. Date of Service: 05/09/2015 8:00 AM Medical Record Number: [id number] Patient Account Number: [id number] Date of Birth/Sex: Aug 07, 1935 (79 y.o. Male) Treating RN: Curtis Sites Primary Care Physician: Dorothey Baseman Other Clinician: Referring Physician: Dorothey Baseman Treating Physician/Extender: Rudene Re in Treatment: 16 Active Inactive Orientation to the Wound Care Program Nursing Diagnoses: Knowledge deficit related to the wound healing center program Goals: Patient/caregiver will verbalize understanding of the Wound Healing Center Program Date Initiated: 01/15/2015 Goal Status: Active Interventions: Provide education on orientation to the wound center Notes: Venous Leg Ulcer Nursing Diagnoses: Knowledge deficit related to disease process and management Potential for venous Insuffiency (use before diagnosis confirmed) Goals: Patient will maintain optimal edema control Date Initiated: 01/15/2015 Goal Status: Active Patient/caregiver will verbalize understanding of disease process and disease management Date Initiated: 01/15/2015 Goal Status: Active Verify adequate tissue perfusion prior to therapeutic compression application Date Initiated: 01/15/2015 Goal Status: Active Interventions: Assess peripheral edema  status every visit. Compression as ordered Provide education on venous insufficiency ROBBIE, DOU ([id number]) Treatment Activities: Test ordered outside of clinic : 05/09/2015 Therapeutic compression applied : 05/09/2015 Venous Duplex Doppler : 05/09/2015 Notes: Wound/Skin Impairment Nursing Diagnoses: Impaired tissue integrity Knowledge deficit related to ulceration/compromised skin integrity Goals: Patient/caregiver will verbalize understanding of skin care regimen Date Initiated: 01/15/2015 Goal Status: Active Ulcer/skin breakdown will have a volume reduction of 30% by week 4 Date Initiated: 01/15/2015 Goal Status: Active Ulcer/skin breakdown will have a volume reduction of 50% by week 8 Date Initiated: 01/15/2015 Goal Status: Active Ulcer/skin breakdown will have a volume reduction of 80% by week 12 Date Initiated: 01/15/2015 Goal Status: Active Ulcer/skin breakdown will heal within 14 weeks Date Initiated: 01/15/2015 Goal Status: Active Interventions: Assess patient/caregiver ability to perform ulcer/skin care regimen upon admission and as needed Assess ulceration(s) every visit Provide education on ulcer and skin care Notes: Electronic Signature(s) Signed: 05/09/2015 8:27:18 AM By: Curtis Sites Entered By: Curtis Sites on 05/09/2015 08:27:18 [name] ([id number]) -------------------------------------------------------------------------------- Patient/Caregiver Education Details Patient Name: [name] Date of Service: 05/09/2015 8:00 AM Medical Record Number: [id number] Patient Account Number: [id number] Date of Birth/Gender: Aug 07, 1935 (79 y.o. Male) Treating RN: Curtis Sites Primary Care Physician: Dorothey Baseman Other Clinician: Referring Physician: Dorothey Baseman Treating Physician/Extender: Rudene Re in Treatment: 16 Education Assessment Education  Provided To: Patient Education Topics Provided Venous: Handouts: Other:  [facility]rap at [facility] if needed Methods: Explain/Verbal Responses: State content correctly Electronic Signature(s) Signed: 05/09/2015 8:28:13 AM By: Curtis Sites Entered By: Curtis Sites on 05/09/2015 08:28:12 LADARRIUS, BOGDANSKI ([id number]) -------------------------------------------------------------------------------- Wound Assessment Details Patient Name: [name] Date of Service: 05/09/2015 8:00 AM Medical Record Number: [id number] Patient Account Number: [id number] Date of Birth/Sex: [birth year] (79 y.o. Male) Treating RN: Curtis Sites Primary Care Physician: Terance Hart, DAVID Other Clinician: Referring Physician: Terance Hart, DAVID Treating Physician/Extender: Rudene Re in Treatment: 16 Wound Status Wound Number: 5 Primary Venous Leg Ulcer Etiology: Wound Location: Right, Medial Lower Leg Wound Status: Open Wounding Event: Gradually Appeared Comorbid Cataracts, Anemia, Hypertension, Date Acquired: 11/13/2014 History: Osteoarthritis Weeks Of Treatment: 16 Clustered Wound: No Photos Photo Uploaded By: Curtis Sites on 05/09/2015 14:39:51 Wound Measurements Length: (cm) 8.5 Width: (cm) 5.2 Depth: (cm) 0.1 Area: (cm) 34.715 Volume: (cm) 3.471 % Reduction in Area: 66.1% % Reduction in Volume: 88.7% Epithelialization: Small (1-33%) Tunneling: No Undermining: No Wound Description Full Thickness Without Exposed Classification: Support Structures Wound Margin: Flat and Intact Exudate Large Amount: Exudate Type: Serosanguineous Exudate Color: red, brown Foul Odor After Cleansing: Yes Due to Product Use: No Wound Bed Granulation Amount: Large (67-100%) Exposed Structure Granulation Quality: Red, Pink Fascia Exposed: No Necrotic Amount: None Present (0%) Fat Layer Exposed: No SAKETH, DAUBERT ([id number]) Tendon Exposed: No Muscle Exposed: No Joint Exposed: No Bone Exposed: No Limited to Skin Breakdown Periwound Skin Texture Texture  Color No Abnormalities Noted: No No Abnormalities Noted: No Callus: No Atrophie Blanche: No Crepitus: No Cyanosis: No Excoriation: No Ecchymosis: No Fluctuance: No Erythema: No Friable: No Hemosiderin Staining: No Induration: No Mottled: No Localized Edema: Yes Pallor: No Rash: No Rubor: No Scarring: No Temperature / Pain Moisture Temperature: No Abnormality No Abnormalities Noted: No Dry / Scaly: No Maceration: No Moist: Yes Wound Preparation Ulcer Cleansing: Other: soap and water, Topical Anesthetic Applied: None Treatment Notes Wound #5 (Right, Medial Lower Leg) 1. Cleansed with: Cleanse wound with antibacterial soap and water 4. Dressing Applied: Other dressing (specify in notes) 7. Secured with 4-Layer Compression System - Right Lower Extremity Notes sorbact, drawtex, carboflex Electronic Signature(s) Signed: 05/09/2015 5:32:28 PM By: Curtis Sites Previous Signature: 05/09/2015 8:27:02 AM Version By: Curtis Sites Entered By: Curtis Sites on 05/09/2015 08:27:11 LENNART, GLADISH ([id number]) -------------------------------------------------------------------------------- Vitals Details Patient Name: [name] Date of Service: 05/09/2015 8:00 AM Medical Record Number: [id number] Patient Account Number: [id number] Date of Birth/Sex: Mar 15, 1936 (79 y.o. Male) Treating RN: Curtis Sites Primary Care Physician: Dorothey Baseman Other Clinician: Referring Physician: Terance Hart, DAVID Treating Physician/Extender: Rudene Re in Treatment: 16 Vital Signs Time Taken: 08:19 Temperature (F): 98.1 Height (in): 68 Pulse (bpm): 63 Respiratory Rate (breaths/min): 18 Blood Pressure (mmHg): 143/87 Reference Range: 80 - 120 mg / dl Electronic Signature(s) Signed: 05/09/2015 5:32:28 PM By: Curtis Sites Entered By: Curtis Sites on 05/09/2015 08:19:15

## 2015-05-13 DIAGNOSIS — I87311 Chronic venous hypertension (idiopathic) with ulcer of right lower extremity: Secondary | ICD-10-CM | POA: Diagnosis not present

## 2015-05-13 NOTE — Progress Notes (Signed)
SOUL, COMPANION ([id number]) Visit Report for 05/13/2015 Arrival Information Details Patient Name: [name], [name] Date of Service: 05/13/2015 11:30 AM Medical Record Number: [id number] Patient Account Number: [id number] Date of Birth/Sex: Sep 05, 1935 (79 y.o. Male) Treating RN: Curtis Sites Primary Care Physician: Dorothey Baseman Other Clinician: Referring Physician: Terance Hart, DAVID Treating Physician/Extender: Rudene Re in Treatment: 16 Visit Information History Since Last Visit Added or deleted any medications: No Patient Arrived: Wheel Chair Any new allergies or adverse reactions: No Arrival Time: 11:41 Had a fall or experienced change in No activities of daily living that may affect Accompanied By: self risk of falls: Transfer Assistance: None Signs or symptoms of abuse/neglect since last No Patient Identification Verified: Yes visito Secondary Verification Process Yes Hospitalized since last visit: No Completed: Pain Present Now: No Patient Requires Transmission-Based No Precautions: Patient Has Alerts: No Electronic Signature(s) Signed: 05/13/2015 12:14:12 PM By: Curtis Sites Entered By: Curtis Sites on 05/13/2015 12:14:12 [name] ([id number]) -------------------------------------------------------------------------------- Encounter Discharge Information Details Patient Name: [name] Date of Service: 05/13/2015 11:30 AM Medical Record Number: [id number] Patient Account Number: [id number] Date of Birth/Sex: December 29, 1935 (79 y.o. Male) Treating RN: Curtis Sites Primary Care Physician: Dorothey Baseman Other Clinician: Referring Physician: Dorothey Baseman Treating Physician/Extender: Rudene Re in Treatment: 16 Encounter Discharge Information Items Discharge Pain Level: 0 Discharge Condition: Stable Ambulatory Status: Wheelchair Nursing Discharge Destination: Home Transportation: Private Auto Accompanied By:  self Schedule Follow-up Appointment: Yes Medication Reconciliation completed No and provided to Patient/Care Donnamarie Shankles: Clinical Summary of Care: Electronic Signature(s) Signed: 05/13/2015 12:15:11 PM By: Curtis Sites Entered By: Curtis Sites on 05/13/2015 12:15:11 [name] ([id number]) -------------------------------------------------------------------------------- Patient/Caregiver Education Details Patient Name: [name] Date of Service: 05/13/2015 11:30 AM Medical Record Number: [id number] Patient Account Number: [id number] Date of Birth/Gender: 1935-07-13 (79 y.o. Male) Treating RN: Curtis Sites Primary Care Physician: Dorothey Baseman Other Clinician: Referring Physician: Dorothey Baseman Treating Physician/Extender: Rudene Re in Treatment: 16 Education Assessment Education Provided To: Patient Education Topics Provided Wound/Skin Impairment: Handouts: Other: keep wrap in place, return on Thursday Methods: Explain/Verbal Responses: State content correctly Electronic Signature(s) Signed: 05/13/2015 12:14:54 PM By: Curtis Sites Entered By: Curtis Sites on 05/13/2015 12:14:54

## 2015-05-16 ENCOUNTER — Encounter: Payer: Medicare Other | Admitting: Surgery

## 2015-05-16 DIAGNOSIS — I87311 Chronic venous hypertension (idiopathic) with ulcer of right lower extremity: Secondary | ICD-10-CM | POA: Diagnosis not present

## 2015-05-17 NOTE — Progress Notes (Signed)
COREY, LASKI ([id number]) Visit Report for 05/16/2015 Arrival Information Details Patient Name: [name], [name] Date of Service: 05/16/2015 2:15 PM Medical Record Number: [id number] Patient Account Number: [id number] Date of Birth/Sex: [birth year] (79 y.o. Male) Treating RN: Clover Mealy, RN, BSN, [place] Sink Primary Care Physician: Terance Hart, DAVID Other Clinician: Referring Physician: Terance Hart, DAVID Treating Physician/Extender: Rudene Re in Treatment: 17 Visit Information History Since Last Visit Added or deleted any medications: No Patient Arrived: Wheel Chair Any new allergies or adverse reactions: No Arrival Time: 14:35 Had a fall or experienced change in No activities of daily living that may affect Accompanied By: self risk of falls: Transfer Assistance: None Signs or symptoms of abuse/neglect since last No Patient Identification Verified: Yes visito Secondary Verification Process Yes Has Dressing in Place as Prescribed: Yes Completed: Has Compression in Place as Prescribed: Yes Patient Requires Transmission-Based No Pain Present Now: No Precautions: Patient Has Alerts: No Electronic Signature(s) Signed: 05/16/2015 2:35:31 PM By: Elpidio Eric BSN, RN Entered By: Elpidio Eric on 05/16/2015 14:35:31 [name] ([id number]) -------------------------------------------------------------------------------- Encounter Discharge Information Details Patient Name: [name] Date of Service: 05/16/2015 2:15 PM Medical Record Number: [id number] Patient Account Number: [id number] Date of Birth/Sex: [birth year] (79 y.o. Male) Treating RN: Clover Mealy, RN, BSN, [place] Sink Primary Care Physician: Terance Hart, DAVID Other Clinician: Referring Physician: Dorothey Baseman Treating Physician/Extender: Rudene Re in Treatment: 17 Encounter Discharge Information Items Schedule Follow-up Appointment: No Medication Reconciliation completed and provided to Patient/Care  No Alila Sotero: Provided on Clinical Summary of Care: 05/16/2015 Form Type Recipient Paper Patient [facility] Electronic Signature(s) Signed: 05/16/2015 3:14:13 PM By: Gwenlyn Perking Entered By: Gwenlyn Perking on 05/16/2015 15:14:12 Warren, Lindahl Chrissie Noa ([id number]) -------------------------------------------------------------------------------- Lower Extremity Assessment Details Patient Name: GARO, HEIDELBERG. Date of Service: 05/16/2015 2:15 PM Medical Record Number: [id number] Patient Account Number: [id number] Date of Birth/Sex: Apr 18, 1936 (79 y.o. Male) Treating RN: Clover Mealy, RN, BSN, [place] Sink Primary Care Physician: Terance Hart, DAVID Other Clinician: Referring Physician: Terance Hart, DAVID Treating Physician/Extender: Rudene Re in Treatment: 17 Edema Assessment Assessed: [Left: No] [Right: No] E[Left: dema] [Right: :] Calf Left: Right: Point of Measurement: 40 cm From Medial Instep cm 37.9 cm Ankle Left: Right: Point of Measurement: 8 cm From Medial Instep cm 28.7 cm Vascular Assessment Pulses: Posterior Tibial Dorsalis Pedis Palpable: [Right:Yes] Extremity colors, hair growth, and conditions: Extremity Color: [Right:Dusky] Hair Growth on Extremity: [Right:No] Temperature of Extremity: [Right:Warm] Capillary Refill: [Right:< 3 seconds] Toe Nail Assessment Left: Right: Thick: Yes Discolored: Yes Deformed: Yes Improper Length and Hygiene: No Electronic Signature(s) Signed: 05/16/2015 2:34:55 PM By: Elpidio Eric BSN, RN Entered By: Elpidio Eric on 05/16/2015 14:34:55 [name] ([id number]) -------------------------------------------------------------------------------- Multi Wound Chart Details Patient Name: [name] Date of Service: 05/16/2015 2:15 PM Medical Record Number: [id number] Patient Account Number: [id number] Date of Birth/Sex: 1935-07-16 (79 y.o. Male) Treating RN: Clover Mealy, RN, BSN, [place] Sink Primary Care Physician: Terance Hart, DAVID Other  Clinician: Referring Physician: Terance Hart, DAVID Treating Physician/Extender: Rudene Re in Treatment: 17 Vital Signs Height(in): 68 Pulse(bpm): 66 Weight(lbs): Blood Pressure 138/76 (mmHg): Body Mass Index(BMI): Temperature(F): 98.1 Respiratory Rate 20 (breaths/min): Photos: [5:No Photos] [N/A:N/A] Wound Location: [5:Right Lower Leg - Medial] [N/A:N/A] Wounding Event: [5:Gradually Appeared] [N/A:N/A] Primary Etiology: [5:Venous Leg Ulcer] [N/A:N/A] Comorbid History: [5:Cataracts, Anemia, Hypertension, Osteoarthritis] [N/A:N/A] Date Acquired: [5:11/13/2014] [N/A:N/A] Weeks of Treatment: [5:17] [N/A:N/A] Wound Status: [5:Open] [N/A:N/A] Measurements L x W x D 8x3x0.1 [N/A:N/A] (cm) Area (cm) : [5:18.85] [N/A:N/A] Volume (cm) : [5:1.885] [N/A:N/A] % Reduction in Area: [5:81.60%] [N/A:N/A] % Reduction in Volume:  93.90% [N/A:N/A] Classification: [5:Full Thickness Without Exposed Support Structures] [N/A:N/A] Exudate Amount: [5:Large] [N/A:N/A] Exudate Type: [5:Serosanguineous] [N/A:N/A] Exudate Color: [5:red, brown] [N/A:N/A] Foul Odor After [5:Yes] [N/A:N/A] Cleansing: Odor Anticipated Due to No [N/A:N/A] Product Use: Wound Margin: [5:Flat and Intact] [N/A:N/A] Granulation Amount: [5:Large (67-100%)] [N/A:N/A] Granulation Quality: [5:Red, Pink] [N/A:N/A] Necrotic Amount: [5:None Present (0%)] [N/A:N/A] Exposed Structures: Fascia: No N/A N/A Fat: No Tendon: No Muscle: No Joint: No Bone: No Limited to Skin Breakdown Epithelialization: Small (1-33%) N/A N/A Periwound Skin Texture: Edema: Yes N/A N/A Excoriation: No Induration: No Callus: No Crepitus: No Fluctuance: No Friable: No Rash: No Scarring: No Periwound Skin Moist: Yes N/A N/A Moisture: Maceration: No Dry/Scaly: No Periwound Skin Color: Atrophie Blanche: No N/A N/A Cyanosis: No Ecchymosis: No Erythema: No Hemosiderin Staining: No Mottled: No Pallor: No Rubor: No Temperature: No  Abnormality N/A N/A Tenderness on No N/A N/A Palpation: Wound Preparation: Ulcer Cleansing: Other: N/A N/A soap and water Topical Anesthetic Applied: None Treatment Notes Electronic Signature(s) Signed: 05/16/2015 2:47:35 PM By: Elpidio Eric BSN, RN Entered By: Elpidio Eric on 05/16/2015 14:47:35 [name] ([id number]) -------------------------------------------------------------------------------- Multi-Disciplinary Care Plan Details Patient Name: [name], [name] Date of Service: 05/16/2015 2:15 PM Medical Record Number: [id number] Patient Account Number: [id number] Date of Birth/Sex: 05-04-1936 (79 y.o. Male) Treating RN: Clover Mealy, RN, BSN, [place] Sink Primary Care Physician: Terance Hart, DAVID Other Clinician: Referring Physician: Terance Hart, DAVID Treating Physician/Extender: Rudene Re in Treatment: 46 Active Inactive Orientation to the Wound Care Program Nursing Diagnoses: Knowledge deficit related to the wound healing center program Goals: Patient/caregiver will verbalize understanding of the Wound Healing Center Program Date Initiated: 01/15/2015 Goal Status: Active Interventions: Provide education on orientation to the wound center Notes: Venous Leg Ulcer Nursing Diagnoses: Knowledge deficit related to disease process and management Potential for venous Insuffiency (use before diagnosis confirmed) Goals: Patient will maintain optimal edema control Date Initiated: 01/15/2015 Goal Status: Active Patient/caregiver will verbalize understanding of disease process and disease management Date Initiated: 01/15/2015 Goal Status: Active Verify adequate tissue perfusion prior to therapeutic compression application Date Initiated: 01/15/2015 Goal Status: Active Interventions: Assess peripheral edema status every visit. Compression as ordered Provide education on venous insufficiency [name], [name] ([id number]) Treatment Activities: Test ordered outside of clinic  : 05/16/2015 Therapeutic compression applied : 05/16/2015 Venous Duplex Doppler : 05/16/2015 Notes: Wound/Skin Impairment Nursing Diagnoses: Impaired tissue integrity Knowledge deficit related to ulceration/compromised skin integrity Goals: Patient/caregiver will verbalize understanding of skin care regimen Date Initiated: 01/15/2015 Goal Status: Active Ulcer/skin breakdown will have a volume reduction of 30% by week 4 Date Initiated: 01/15/2015 Goal Status: Active Ulcer/skin breakdown will have a volume reduction of 50% by week 8 Date Initiated: 01/15/2015 Goal Status: Active Ulcer/skin breakdown will have a volume reduction of 80% by week 12 Date Initiated: 01/15/2015 Goal Status: Active Ulcer/skin breakdown will heal within 14 weeks Date Initiated: 01/15/2015 Goal Status: Active Interventions: Assess patient/caregiver ability to perform ulcer/skin care regimen upon admission and as needed Assess ulceration(s) every visit Provide education on ulcer and skin care Notes: Electronic Signature(s) Signed: 05/16/2015 2:47:25 PM By: Elpidio Eric BSN, RN Entered By: Elpidio Eric on 05/16/2015 14:47:25 [name], [name] ([id number]) -------------------------------------------------------------------------------- Pain Assessment Details Patient Name: [name] Date of Service: 05/16/2015 2:15 PM Medical Record Number: [id number] Patient Account Number: [id number] Date of Birth/Sex: [birth year] (79 y.o. Male) Treating RN: Clover Mealy, RN, BSN, [place] Sink Primary Care Physician: Dorothey Baseman Other Clinician: Referring Physician: Terance Hart DAVID Treating Physician/Extender: Rudene Re in Treatment: 17 Active Problems Location  of Pain Severity and Description of Pain Patient Has Paino No Site Locations Pain Management and Medication Current Pain Management: Electronic Signature(s) Signed: 05/16/2015 2:35:12 PM By: Elpidio Eric BSN, RN Entered By: Elpidio Eric on 05/16/2015  14:35:12 [name], [name] ([id number]) -------------------------------------------------------------------------------- Wound Assessment Details Patient Name: [name], [name] Date of Service: 05/16/2015 2:15 PM Medical Record Number: [id number] Patient Account Number: [id number] Date of Birth/Sex: [birth year] (79 y.o. Male) Treating RN: Clover Mealy, RN, BSN, [place] Primary Care Physician: Terance Hart, DAVID Other Clinician: Referring Physician: Terance Hart, DAVID Treating Physician/Extender: Rudene Re in Treatment: 17 Wound Status Wound Number: [id number] Primary Venous Leg Ulcer Etiology: Wound Location: Right Lower Leg - Medial Wound Status: Open Wounding Event: Gradually Appeared Comorbid Cataracts, Anemia, Hypertension, Date Acquired: 11/13/2014 History: Osteoarthritis Weeks Of Treatment: 17 Clustered Wound: No Photos Photo Uploaded By: Elpidio Eric on 05/16/2015 16:08:33 Wound Measurements Length: (cm) 8 Width: (cm) 3 Depth: (cm) 0.1 Area: (cm) 18.85 Volume: (cm) 1.885 % Reduction in Area: 81.6% % Reduction in Volume: 93.9% Epithelialization: Small (1-33%) Tunneling: No Undermining: No Wound Description Full Thickness Without Exposed Classification: Support Structures Wound Margin: Flat and Intact Exudate Large Amount: Exudate Type: Serosanguineous Exudate Color: red, brown Foul Odor After Cleansing: Yes Due to Product Use: No Wound Bed Granulation Amount: Large (67-100%) Exposed Structure Granulation Quality: Red, Pink Fascia Exposed: No Necrotic Amount: None Present (0%) Fat Layer Exposed: No [name], [name] ([id number]) Tendon Exposed: No Muscle Exposed: No Joint Exposed: No Bone Exposed: No Limited to Skin Breakdown Periwound Skin Texture Texture Color No Abnormalities Noted: No No Abnormalities Noted: No Callus: No Atrophie Blanche: No Crepitus: No Cyanosis: No Excoriation: No Ecchymosis: No Fluctuance: No Erythema: No Friable:  No Hemosiderin Staining: No Induration: No Mottled: No Localized Edema: Yes Pallor: No Rash: No Rubor: No Scarring: No Temperature / Pain Moisture Temperature: No Abnormality No Abnormalities Noted: No Dry / Scaly: No Maceration: No Moist: Yes Wound Preparation Ulcer Cleansing: Other: soap and water, Topical Anesthetic Applied: None Electronic Signature(s) Signed: 05/16/2015 3:00:50 PM By: Elpidio Eric BSN, RN Previous Signature: 05/16/2015 2:46:54 PM Version By: Elpidio Eric BSN, RN Entered By: Elpidio Eric on 05/16/2015 15:00:50 [name] ([id number]) -------------------------------------------------------------------------------- Vitals Details Patient Name: [name] Date of Service: 05/16/2015 2:15 PM Medical Record Number: [id number] Patient Account Number: [id number] Date of Birth/Sex: [birth year] (79 y.o. Male) Treating RN: Clover Mealy, RN, BSN, [place] Primary Care Physician: Terance Hart, DAVID Other Clinician: Referring Physician: Terance Hart, DAVID Treating Physician/Extender: Rudene Re in Treatment: 17 Vital Signs Time Taken: 14:45 Temperature (F): 98.1 Height (in): 68 Pulse (bpm): 66 Respiratory Rate (breaths/min): 20 Blood Pressure (mmHg): 138/76 Reference Range: 80 - 120 mg / dl Electronic Signature(s) Signed: 05/16/2015 2:46:24 PM By: Elpidio Eric BSN, RN Entered By: Elpidio Eric on 05/16/2015 14:46:24

## 2015-05-17 NOTE — Progress Notes (Addendum)
[name] ([id number]) Visit Report for 05/16/2015 Chief Complaint Document Details Patient Name: [name], [name] Date of Service: 05/16/2015 2:15 PM Medical Record Number: [id number] Patient Account Number: [id number] Date of Birth/Sex: 27-Jul-1935 (79 y.o. Male) Treating RN: Clover Mealy, RN, BSN, [place] Sink Primary Care Physician: Terance Hart, DAVID Other Clinician: Referring Physician: Dorothey Baseman Treating Physician/Extender: Rudene Re in Treatment: 17 Information Obtained from: Patient Chief Complaint Patient presents for treatment of an open ulcer due to venous insufficiency. The patient has had a open wound on his right medial ankle for at least 3 years and was last seen in the wound clinic in February 2015. He was lost to follow-up after that. Electronic Signature(s) Signed: 05/16/2015 3:20:29 PM By: Evlyn Kanner MD, FACS Entered By: Evlyn Kanner on 05/16/2015 15:20:29 [name] ([id number]) -------------------------------------------------------------------------------- Cellular or Tissue Based Product Details Patient Name: [name], [name] Date of Service: 05/16/2015 2:15 PM Medical Record Number: [id number] Patient Account Number: [id number] Date of Birth/Sex: [birth year] (79 y.o. Male) Treating RN: Clover Mealy, RN, BSN, [place] Sink Primary Care Physician: Terance Hart, DAVID Other Clinician: Referring Physician: Terance Hart DAVID Treating Physician/Extender: Rudene Re in Treatment: 17 Cellular or Tissue Based Wound #5 Right,Medial Lower Leg Product Type Applied to: Performed By: Physician Evlyn Kanner, MD Cellular or Tissue Based Apligraf Product Type: Time-Out Taken: Yes Location: trunk / arms / legs Wound Size (sq cm): 24 Product Size (sq cm): 44 Waste Size (sq cm): 20 Waste Reason: wound size Amount of Product Applied (sq cm): 24 Lot #: [id number] Expiration Date: 05/18/2015 Fenestrated: Yes Instrument: Blade Reconstituted:  Yes Solution Type: saline Solution Amount: 2ml Lot #: [id number] Solution Expiration 01/03/2017 Date: Secured: Yes Secured With: Steri-Strips Dressing Applied: Yes Primary Dressing: mepitel Procedural Pain: 0 Post Procedural Pain: 0 Response to Treatment: Procedure was tolerated well Post Procedure Diagnosis Same as Pre-procedure Electronic Signature(s) Signed: 05/16/2015 3:20:21 PM By: Evlyn Kanner MD, FACS Previous Signature: 05/16/2015 3:02:49 PM Version By: Elpidio Eric BSN, RN Entered By: Evlyn Kanner on 05/16/2015 15:20:21 [name] ([id number]) [name] ([id number]) -------------------------------------------------------------------------------- HPI Details Patient Name: [name], [name] Date of Service: 05/16/2015 2:15 PM Medical Record Number: [id number] Patient Account Number: [id number] Date of Birth/Sex: 12-05-1935 (79 y.o. Male) Treating RN: Clover Mealy, RN, BSN, [place] Sink Primary Care Physician: Terance Hart, DAVID Other Clinician: Referring Physician: Terance Hart, DAVID Treating Physician/Extender: Rudene Re in Treatment: 17 History of Present Illness Location: right lower extremity ulceration Quality: Patient reports experiencing a dull pain to affected area(s). Severity: Patient states wound are getting worse. 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 Modifying Factors: Consults to this date include:has received treatment in the wound center in the past but stopped coming here since February 2015 Associated Signs and Symptoms: Patient reports having difficulty standing for long periods. HPI Description: 79 year old male who is known to have progressive weakness and  has been in a nursing home for a while has had chronic lower extremity edema both legs and a large open wound on the right lower extremity which is has at least for about 3-4 years. The patient was seen in the wound clinic before and has been treated until February 2015 when he was lost to follow-up. Past  medical history is significant for hypertension, gout, venous stasis ulcers, Parkinson's [name], constipation. He's not been a diabetic but his last hemoglobin A1c was 6 in March 2015. There is documentation that he's had endovenous ablation of bilateral varicose veins but we have no documentation about this and this may be done at least 4-5 years ago. 01/22/2015 -- he has his vascular test is scheduled for this afternoon. 01/29/2015 -- the patient's vascular test was canceled last week because he was unable to get onto the examining bed at [facility]. His Unna's boot was not applied either and the patient had a dressing placed by home health. Today when his dressing was removed we found a lot of maggots in his right lower extremity. 02/07/2015 -- Was seen in the vascular office by the PA and she noted that a bilateral venous duplex study did not show any DVT, SVT or reflux bilaterally. The patient was advised to continue with Unna's boot and would at some stage to require graduated compression stockings of the 20-30 mmHg variety. In the future lymphedema pumps would also be considered. 02/14/2015 -- his dressing is smelling quite a bit and there is a discoloration of the wound suggestive of an infection. Deep tissue cultures will be taken today. 02/21/2015 -- the culture is back and it has growth moderate growth of Proteus and gram-negative rods sensitive to sulfa, ampicillin, cephazolin, Zosyn. He is already on doxycycline and his wound is looking clean and hence we will continue with this. 02/28/2015 -- he's been doing fine still on his antibiotics and has no fresh issues. 03/07/2015 -- I  was told that because he lives in a nursing home the Apligraf was denied by his insurance company. 03/14/2015 -- we are awaiting samples and a trial of Puraply to be tried for his ulceration. 03/21/2015 -- his Apligraf was approved and he is here for his first application of Apligraf 03/28/2015 -- his wound has had quite a bit of secretion and needs to be changed and hence I will use the CAISYN, CAPUTO. ([id number]) second application of Apligraf. 04/04/2015 -- he is here for a wound check but has a lot of secretions and hence his dressing needs to be taken down. 04/09/2015 -- he is here for his third application of Apligraf 04/18/2015 -- he is here for a wound check and though he does not have any overt infection he always has a foul odor to his leg. 04/25/2015 -- his wound has been doing much better and he has minimal drainage and the size is smaller. Of note he is on doxycycline. 05/02/2015 -- he is here for his fourth application of Apligraf 05/09/2015 -- he is here for a wound check and is bothered by a lot of odor from the wound. 05/16/2015 -- he has done very well, the odor and drainage is less with the wound dressing having been changed twice this week. He is here for his last application of Apligraf. Electronic Signature(s) Signed: 05/16/2015 3:21:41 PM By: Evlyn Kanner MD, FACS Entered By: Evlyn Kanner on 05/16/2015 15:21:41 JOHNAVEN, TRITES ([id number]) -------------------------------------------------------------------------------- Physical Exam Details Patient Name: [name], [name] Date of Service: 05/16/2015 2:15 PM Medical Record Number: [id number] Patient Account Number: [id number] Date of Birth/Sex: 09/05/1935 (79 y.o. Male) Treating RN: Clover Mealy, RN, BSN, [place] Sink Primary Care Physician: Terance Hart, DAVID Other Clinician: Referring Physician: Terance Hart, DAVID Treating Physician/Extender: Rudene Re in Treatment: 17 Constitutional . Pulse regular.  Respirations normal and unlabored. Afebrile. . Eyes Nonicteric. Reactive to light.  Ears, Nose, Mouth, and Throat Lips, teeth, and gums WNL.Marland Kitchen Moist mucosa without lesions . Neck supple and nontender. No palpable supraclavicular or cervical adenopathy. Normal sized without goiter. Respiratory WNL. No retractions.. Breath sounds WNL, No rubs, rales, rhonchi, or wheeze.. Cardiovascular Heart rhythm and rate regular, no murmur or gallop.. Pedal Pulses WNL. No clubbing, cyanosis or edema. Chest Breasts symmetical and no nipple discharge.. Breast tissue WNL, no masses, lumps, or tenderness.. Lymphatic No adneopathy. No adenopathy. No adenopathy. Musculoskeletal Adexa without tenderness or enlargement.. Digits and nails w/o clubbing, cyanosis, infection, petechiae, ischemia, or inflammatory conditions.. Integumentary (Hair, Skin) No suspicious lesions. No crepitus or fluctuance. No peri-wound warmth or erythema. No masses.Marland Kitchen Psychiatric Judgement and insight Intact.. No evidence of depression, anxiety, or agitation.. Notes the wound is looking excellent and there is good resolution of the drainage and the odor. Electronic Signature(s) Signed: 05/16/2015 3:22:12 PM By: Evlyn Kanner MD, FACS Entered By: Evlyn Kanner on 05/16/2015 15:22:12 JIYAN, WALKOWSKI ([id number]) -------------------------------------------------------------------------------- Physician Orders Details Patient Name: [name], [name] Date of Service: 05/16/2015 2:15 PM Medical Record Number: [id number] Patient Account Number: [id number] Date of Birth/Sex: [birth year] (79 y.o. Male) Treating RN: Clover Mealy, RN, BSN, [place] Sink Primary Care Physician: Terance Hart, DAVID Other Clinician: Referring Physician: Terance Hart DAVID Treating Physician/Extender: Rudene Re in Treatment: 16 Verbal / Phone Orders: Yes Clinician: Afful, RN, BSN, Rita Read Back and Verified: Yes Diagnosis Coding Wound Cleansing Wound #5  Right,Medial Lower Leg o Cleanse wound with mild soap and water Skin Barriers/Peri-Wound Care Wound #5 Right,Medial Lower Leg o Barrier cream Primary Wound Dressing Wound #5 Right,Medial Lower Leg o Other: - sorbact Secondary Dressing Wound #5 Right,Medial Lower Leg o Drawtex - and carboflex Dressing Change Frequency Wound #5 Right,Medial Lower Leg o Change dressing every week Follow-up Appointments Wound #5 Right,Medial Lower Leg o Return Appointment in 1 week. o Nurse Visit as needed Edema Control Wound #5 Right,Medial Lower Leg o 4-Layer Compression System - Right Lower Extremity - profore Advanced Therapies Wound #5 Right,Medial Lower Leg o Apligraf application in clinic; including contact layer, fixation with steri strips, dry gauze and cover dressing. COPELAND, NEISEN ([id number]) Medications-please add to medication list. Wound #5 Right,Medial Lower Leg o P.O. Antibiotics - doxycycline for 2 weeks Electronic Signature(s) Signed: 05/16/2015 3:04:02 PM By: Elpidio Eric BSN, RN Signed: 05/16/2015 4:04:17 PM By: Evlyn Kanner MD, FACS Entered By: Elpidio Eric on 05/16/2015 15:04:02 GILVERTO, DILEONARDO ([id number]) -------------------------------------------------------------------------------- Problem List Details Patient Name: [name], [name] Date of Service: 05/16/2015 2:15 PM Medical Record Number: [id number] Patient Account Number: [id number] Date of Birth/Sex: 1935-12-31 (79 y.o. 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 Date of Service: 05/16/2015 2:15 PM Medical Record Number: [id number] Patient Account Number: [id number] Date of Birth/Sex: [birth year] (79 y.o. Male) Treating RN: Clover Mealy, RN, BSN, [place] Sink Primary Care Physician: Dorothey Baseman Other Clinician: Referring Physician: Dorothey Baseman Treating Physician/Extender: Meyer Russel,  Horace Lukas Weeks in Treatment: 17 Subjective Chief Complaint Information obtained from Patient Patient presents for treatment of an open ulcer due to venous insufficiency. The patient has had a open wound on his right medial ankle for at least 3 years and was last seen in the wound clinic in February 2015. He was lost to follow-up after that. History of Present Illness (HPI) The following HPI elements were documented for the patient's wound: Location: right lower extremity ulceration Quality: Patient reports experiencing a dull pain to affected area(s). Severity: Patient states wound are getting worse. 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 Modifying Factors: Consults to this date include:has received treatment in the wound center in the past but stopped coming here since February 2015 Associated Signs and Symptoms: Patient  reports having difficulty standing for long periods. 79 year old male who is known to have progressive weakness and has been in a nursing home for a while has had chronic lower extremity edema both legs and a large open wound on the right lower extremity which is has at least for about 3-4 years. The patient was seen in the wound clinic before and has been treated until February 2015 when he was lost to follow-up. Past medical history is significant for hypertension, gout, venous stasis ulcers, Parkinson's [name], constipation. He's not been a diabetic but his last hemoglobin A1c was 6 in March 2015. There is documentation that he's had endovenous ablation of bilateral varicose veins but we have no documentation about this and this may be done at least 4-5 years ago. 01/22/2015 -- he has his vascular test is scheduled for this afternoon. 01/29/2015 -- the patient's vascular test was canceled last week because he was unable to get onto the examining bed at [facility]. His Unna's boot was not applied either and the patient had a dressing placed by home health. Today when his dressing was removed we found a lot of maggots in his right lower extremity. 02/07/2015 -- Was seen in the vascular office by the PA and she noted that a bilateral venous duplex study did not show any DVT, SVT or reflux bilaterally. The patient was advised to continue with Unna's boot and would at some stage to require graduated compression stockings of the 20-30 mmHg variety. In the future lymphedema pumps would also be considered. 02/14/2015 -- his dressing is smelling quite a bit and there is a discoloration of the wound suggestive of an [name], [name] ([id number]) infection. Deep tissue cultures will be taken today. 02/21/2015 -- the culture is back and it has growth moderate growth of Proteus and gram-negative rods sensitive to sulfa, ampicillin, cephazolin, Zosyn. He is already on doxycycline and his wound is  looking clean and hence we will continue with this. 02/28/2015 -- he's been doing fine still on his antibiotics and has no fresh issues. 03/07/2015 -- I was told that because he lives in a nursing home the Apligraf was denied by his insurance company. 03/14/2015 -- we are awaiting samples and a trial of Puraply to be tried for his ulceration. 03/21/2015 -- his Apligraf was approved and he is here for his first application of Apligraf 03/28/2015 -- his wound has had  quite a bit of secretion and needs to be changed and hence I will use the second application of Apligraf. 04/04/2015 -- he is here for a wound check but has a lot of secretions and hence his dressing needs to be taken down. 04/09/2015 -- he is here for his third application of Apligraf 04/18/2015 -- he is here for a wound check and though he does not have any overt infection he always has a foul odor to his leg. 04/25/2015 -- his wound has been doing much better and he has minimal drainage and the size is smaller. Of note he is on doxycycline. 05/02/2015 -- he is here for his fourth application of Apligraf 05/09/2015 -- he is here for a wound check and is bothered by a lot of odor from the wound. 05/16/2015 -- he has done very well, the odor and drainage is less with the wound dressing having been changed twice this week. He is here for his last application of Apligraf. Objective Constitutional Pulse regular. Respirations normal and unlabored. Afebrile. Vitals Time Taken: 2:45 PM, Height: 68 in, Temperature: 98.1 F, Pulse: 66 bpm, Respiratory Rate: 20 breaths/min, Blood Pressure: 138/76 mmHg. Eyes Nonicteric. Reactive to light. Ears, Nose, Mouth, and Throat Lips, teeth, and gums WNL.Marland Kitchen Moist mucosa without lesions . Neck supple and nontender. No palpable supraclavicular or cervical adenopathy. Normal sized without goiter. Respiratory WNL. No retractions.. Breath sounds WNL, No rubs, rales, rhonchi, or wheeze.Marland Kitchen [name], [name] ([id number]) Cardiovascular Heart rhythm and rate regular, no murmur or gallop.. Pedal Pulses WNL. No clubbing, cyanosis or edema. Chest Breasts symmetical and no nipple discharge.. Breast tissue WNL, no masses, lumps, or tenderness.. Lymphatic No adneopathy. No adenopathy. No adenopathy. Musculoskeletal Adexa without tenderness or enlargement.. Digits and nails w/o clubbing, cyanosis, infection, petechiae, ischemia, or inflammatory conditions.Marland Kitchen Psychiatric Judgement and insight Intact.. No evidence of depression, anxiety, or agitation.. General Notes: the wound is looking excellent and there is good resolution of the drainage and the odor. Integumentary (Hair, Skin) No suspicious lesions. No crepitus or fluctuance. No peri-wound warmth or erythema. No masses.. Wound #5 status is Open. Original cause of wound was Gradually Appeared. The wound is located on the Right,Medial Lower Leg. The wound measures 8cm length x 3cm width x 0.1cm depth; 18.85cm^2 area and 1.885cm^3 volume. The wound is limited to skin breakdown. There is no tunneling or undermining noted. There is a large amount of serosanguineous drainage noted. The wound margin is flat and intact. There is large (67-100%) red, pink granulation within the wound bed. There is no necrotic tissue within the wound bed. The periwound skin appearance exhibited: Localized Edema, Moist. The periwound skin appearance did not exhibit: Callus, Crepitus, Excoriation, Fluctuance, Friable, Induration, Rash, Scarring, Dry/Scaly, Maceration, Atrophie Blanche, Cyanosis, Ecchymosis, Hemosiderin Staining, Mottled, Pallor, Rubor, Erythema. Periwound temperature was noted as No Abnormality. 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 I have recommended the following to be applied over the Mepitel and Steri-Strips.: 1. They will use a layer of Sorbact,drawtext and then Carboflex. 2. we will continue with the 4-layer Profore wrap. 3. He will continue with doxycycline 100 mg by mouth twice a day for 14 days.  Procedures Wound #5 Wound #5 is a Venous Leg Ulcer located on the Right,Medial Lower Leg. A skin graft procedure using a bioengineered skin substitute/cellular or tissue based product was performed by Evlyn Kanner, MD. Apligraf was applied and secured with Steri-Strips. 24 sq cm of product was utilized and 20 sq cm was wasted due to wound size. Post Application, mepitel was applied. A Time Out was conducted prior to the start of the procedure. The procedure was tolerated well with a pain level of 0 throughout and a pain level of 0 following the procedure. Post procedure Diagnosis Wound #5: Same as Pre-Procedure . Plan Wound Cleansing: Wound #5 Right,Medial Lower Leg: Cleanse wound with mild soap and water Skin Barriers/Peri-Wound Care: Wound #5 Right,Medial Lower Leg: Barrier cream Primary Wound Dressing: Wound #5 Right,Medial Lower Leg: Other: - sorbact Secondary Dressing: Wound #5 Right,Medial Lower Leg: Drawtex - and carboflex Dressing Change Frequency: Wound #5 Right,Medial Lower Leg: Change dressing every week Follow-up Appointments: Wound #5 Right,Medial Lower Leg: Return Appointment in 1 week. Nurse Visit as needed Edema Control: Wound #5 Right,Medial Lower Leg: 4-Layer Compression System - Right Lower Extremity - profore Advanced Therapies: Wound #5 Right,Medial Lower Leg: Apligraf application in clinic; including contact layer, fixation with steri strips, dry gauze and cover dressing. [name], [name]  ([id number]) Medications-please add to medication list.: Wound #5 Right,Medial Lower Leg: P.O. Antibiotics - doxycycline for 2 weeks With the usual precautions and sterile technique the last Apligraf was applied and bolstered in place. I have recommended the following to be applied over the Mepitel and Steri-Strips.: 1. They will use a layer of Sorbact,drawtext and then Carboflex. 2. we will continue with the 4-layer Profore wrap. 3. He will continue with doxycycline 100 mg by mouth twice a day for 14 days. Electronic Signature(s) Signed: 05/16/2015 3:24:01 PM By: Evlyn Kanner MD, FACS Entered By: Evlyn Kanner on 05/16/2015 15:24:01 [name], [name] ([id number]) -------------------------------------------------------------------------------- SuperBill Details Patient Name: [name], [name] Date of Service: 05/16/2015 Medical Record Number: [id number] Patient Account Number: [id number] Date of Birth/Sex: 04/06/1936 (79 y.o. 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 E66.01 Morbid (severe) obesity due to excess calories I89.0 Lymphedema, not elsewhere classified Facility Procedures CPT4 Code Description: 08657846 (Facility Use Only) Apligraf 1 SQ CM Modifier: Quantity: 44 CPT4 Code Description: [id number] 15271 - SKIN SUB GRAFT TRNK/ARM/LEG ICD-10 Description Diagnosis I87.311 Chronic venous hypertension (idiopathic) with ulcer of 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 E66.01 Morbid (severe) obesity  due to excess calories I89.0 Lymphedema, not elsewhere classified Modifier: right lower s of  muscle Quantity: 1 extremity Physician Procedures CPT4 Code Description: [id number] 15271 - WC PHYS SKIN SUB GRAFT TRNK/ARM/LEG ICD-10 Description Diagnosis I87.311 Chronic venous hypertension (idiopathic) with ulcer of 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s E66.01 Morbid (severe)  obesity due to excess calories I89.0 Lymphedema, not elsewhere classified Modifier: right lower of muscle Quantity: 1 extremity Electronic Signature(s) Signed: 05/16/2015 3:24:24 PM By: Evlyn Kanner MD, FACS Entered By: Evlyn Kanner on 05/16/2015 15:24:23

## 2015-05-23 ENCOUNTER — Encounter: Payer: Medicare Other | Admitting: Surgery

## 2015-05-23 DIAGNOSIS — I87311 Chronic venous hypertension (idiopathic) with ulcer of right lower extremity: Secondary | ICD-10-CM | POA: Diagnosis not present

## 2015-05-24 NOTE — Progress Notes (Addendum)
QASIM, DIVELEY ([id number]) Visit Report for 05/23/2015 Chief Complaint Document Details Patient Name: [name], [name] Date of Service: 05/23/2015 2:45 PM Medical Record Number: [id number] Patient Account Number: [id number] Date of Birth/Sex: December 26, 1935 (79 y.o. Male) Treating RN: Curtis Sites Primary Care Physician: Dorothey Baseman Other Clinician: Referring Physician: Dorothey Baseman Treating Physician/Extender: Rudene Re in Treatment: 18 Information Obtained from: Patient Chief Complaint Patient presents for treatment of an open ulcer due to venous insufficiency. The patient has had a open wound on his right medial ankle for at least 3 years and was last seen in the wound clinic in February 2015. He was lost to follow-up after that. Electronic Signature(s) Signed: 05/23/2015 3:24:44 PM By: Evlyn Kanner MD, FACS Entered By: Evlyn Kanner on 05/23/2015 15:24:44 KAY, RICCIUTI ([id number]) -------------------------------------------------------------------------------- HPI Details Patient Name: [name], [name] Date of Service: 05/23/2015 2:45 PM Medical Record Number: [id number] Patient Account Number: [id number] Date of Birth/Sex: 10-30-1935 (79 y.o. Male) Treating RN: Curtis Sites Primary Care Physician: Dorothey Baseman Other Clinician: Referring Physician: Dorothey Baseman Treating Physician/Extender: Rudene Re in Treatment: 18 History of Present Illness Location: right lower extremity ulceration Quality: Patient reports experiencing a dull pain to affected area(s). Severity: Patient states wound are getting worse. 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 Modifying Factors: Consults to this date include:has received treatment in the wound center in the past but stopped coming here since February 2015 Associated Signs and Symptoms: Patient reports having difficulty standing for long periods. HPI Description: 79 year old male who is known to have progressive weakness and has been in a nursing home for a while has had chronic lower extremity edema both legs and a large open wound on the right lower extremity which is has at least for about 3-4 years. The patient was seen in the wound clinic before and has been treated until February 2015 when he was lost to follow-up. Past medical history is significant for hypertension, gout, venous stasis ulcers, Parkinson's [name], constipation. He's not been a diabetic but his last hemoglobin A1c was 6 in March 2015. There is documentation that he's had endovenous ablation of bilateral varicose veins but we have no documentation about this and this may be done at least 4-5 years ago. 01/22/2015 -- he has his vascular test is scheduled for this afternoon. 01/29/2015 -- the patient's vascular test was canceled last week because he was unable to get onto the examining bed at [facility]. His Unna's boot was not applied either and the patient had a dressing placed by home health. Today when his dressing was removed we found a lot of maggots in his right lower extremity. 02/07/2015 -- Was seen in the vascular office by the PA and she noted that a bilateral venous duplex study did not show any DVT, SVT or reflux bilaterally. The patient was advised to continue with Unna's boot and would at some stage to require graduated compression stockings of the 20-30 mmHg variety. In the future lymphedema pumps would also be considered. 02/14/2015 -- his dressing is smelling quite a bit and there is a discoloration of the wound suggestive of an infection. Deep tissue cultures will be taken today. 02/21/2015 --  the culture  is back and it has growth moderate growth of Proteus and gram-negative rods sensitive to sulfa, ampicillin, cephazolin, Zosyn. He is already on doxycycline and his wound is looking clean and hence we will continue with this. 02/28/2015 -- he's been doing fine still on his antibiotics and has no fresh issues. 03/07/2015 -- I was told that because he lives in a nursing home the Apligraf was denied by his insurance company. 03/14/2015 -- we are awaiting samples and a trial of Puraply to be tried for his ulceration. 03/21/2015 -- his Apligraf was approved and he is here for his first application of Apligraf 03/28/2015 -- his wound has had quite a bit of secretion and needs to be changed and hence I will use the CAROLL, [place]. ([id number]) second application of Apligraf. 04/04/2015 -- he is here for a wound check but has a lot of secretions and hence his dressing needs to be taken down. 04/09/2015 -- he is here for his third application of Apligraf 04/18/2015 -- he is here for a wound check and though he does not have any overt infection he always has a foul odor to his leg. 04/25/2015 -- his wound has been doing much better and he has minimal drainage and the size is smaller. Of note he is on doxycycline. 05/02/2015 -- he is here for his fourth application of Apligraf 05/09/2015 -- he is here for a wound check and is bothered by a lot of odor from the wound. 05/16/2015 -- he has done very well, the odor and drainage is less with the wound dressing having been changed twice this week. He is here for his last application of Apligraf. Electronic Signature(s) Signed: 05/23/2015 3:24:55 PM By: Evlyn Kanner MD, FACS Entered By: Evlyn Kanner on 05/23/2015 15:24:55 [name], [name] ([id number]) -------------------------------------------------------------------------------- Physical Exam Details Patient Name: [name], [name] Date of Service: 05/23/2015 2:45 PM Medical Record Number:  [id number] Patient Account Number: [id number] Date of Birth/Sex: 09-May-1936 (79 y.o. Male) Treating RN: Curtis Sites Primary Care Physician: Dorothey Baseman Other Clinician: Referring Physician: Terance Hart, DAVID Treating Physician/Extender: Rudene Re in Treatment: 18 Constitutional . Pulse regular. Respirations normal and unlabored. Afebrile. . Eyes Nonicteric. Reactive to light. Ears, Nose, Mouth, and Throat Lips, teeth, and gums WNL.Marland Kitchen Moist mucosa without lesions . Neck supple and nontender. No palpable supraclavicular or cervical adenopathy. Normal sized without goiter. Respiratory WNL. No retractions.. Cardiovascular Pedal Pulses WNL. No clubbing, cyanosis or edema. Lymphatic No adneopathy. No adenopathy. No adenopathy. Musculoskeletal Adexa without tenderness or enlargement.. Digits and nails w/o clubbing, cyanosis, infection, petechiae, ischemia, or inflammatory conditions.. Integumentary (Hair, Skin) No suspicious lesions. No crepitus or fluctuance. No peri-wound warmth or erythema. No masses.Marland Kitchen Psychiatric Judgement and insight Intact.. No evidence of depression, anxiety, or agitation.. Notes the dressing was taken down and the wound washed out with saline and is looking excellent. Electronic Signature(s) Signed: 05/23/2015 3:25:23 PM By: Evlyn Kanner MD, FACS Entered By: Evlyn Kanner on 05/23/2015 15:25:23 [name], [name] ([id number]) -------------------------------------------------------------------------------- Physician Orders Details Patient Name: [name], [name] Date of Service: 05/23/2015 2:45 PM Medical Record Number: [id number] Patient Account Number: [id number] Date of Birth/Sex: 05/10/1936 (79 y.o. Male) Treating RN: Curtis Sites Primary Care Physician: Dorothey Baseman Other Clinician: Referring Physician: Dorothey Baseman Treating Physician/Extender: Rudene Re in Treatment: 51 Verbal / Phone Orders: Yes Clinician:  Curtis Sites Read Back and Verified: Yes Diagnosis Coding Wound Cleansing Wound #5 Right,Medial Lower Leg o Cleanse wound with mild soap and water Primary  Wound Dressing Wound #5 Right,Medial Lower Leg o Other: - sorbact - green dressing Secondary Dressing Wound #5 Right,Medial Lower Leg o Foam - siltec foam then cover with carboflex Dressing Change Frequency Wound #5 Right,Medial Lower Leg o Change dressing every week - SNF RN to change dressing and wrap on Friday 05/31/2015 Follow-up Appointments Wound #5 Right,Medial Lower Leg o Return Appointment in 2 weeks. Edema Control Wound #5 Right,Medial Lower Leg o 4-Layer Compression System - Right Lower Extremity - only to be applied at wound care center o Unna Boot to Right Lower Extremity - SNF RN to apply unna boot to right lower leg from toes to 3cm below the knee on Friday 05/31/2015 Medications-please add to medication list. Wound #5 Right,Medial Lower Leg o P.O. Antibiotics - doxycycline for 2 weeks Electronic Signature(s) Signed: 05/23/2015 4:05:55 PM By: Evlyn Kanner MD, FACS Signed: 05/23/2015 5:15:54 PM By: Ebbie Ridge ([id number]) Entered By: Curtis Sites on 05/23/2015 15:25:26 [name], [name] ([id number]) -------------------------------------------------------------------------------- Problem List Details Patient Name: [name], [name] Date of Service: 05/23/2015 2:45 PM Medical Record Number: [id number] Patient Account Number: [id number] Date of Birth/Sex: 02/03/1936 (79 y.o. 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 Date of Service: 05/23/2015 2:45 PM Medical Record Number: [id number] Patient Account Number: [id number] Date of Birth/Sex: 11-28-1935 (79 y.o. Male) Treating RN: Curtis Sites Primary Care Physician: Dorothey Baseman Other Clinician: Referring Physician: Dorothey Baseman Treating Physician/Extender: Rudene Re in Treatment: 18 Subjective Chief Complaint Information obtained from Patient Patient presents for treatment of an open ulcer due to venous insufficiency. The patient has had a open wound on his right medial ankle for at least 3 years and was last seen in the wound clinic in February 2015. He was lost to follow-up after that. History of Present Illness (HPI) The following HPI elements were documented for the patient's wound: Location: right lower extremity ulceration Quality: Patient reports experiencing a dull pain to affected area(s). Severity: Patient states wound are getting worse. 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 Modifying Factors: Consults to this date include:has received treatment in the wound center in the past but stopped coming here since  February 2015 Associated Signs and Symptoms: Patient reports having difficulty standing for long periods. 79 year old male who is known to have progressive weakness and has been in a nursing home for a while has had chronic lower extremity edema both legs and a large open wound on the right lower extremity which is has at least for about 3-4 years. The patient was seen in the wound clinic before and  has been treated until February 2015 when he was lost to follow-up. Past medical history is significant for hypertension, gout, venous stasis ulcers, Parkinson's [name], constipation. He's not been a diabetic but his last hemoglobin A1c was 6 in March 2015. There is documentation that he's had endovenous ablation of bilateral varicose veins but we have no documentation about this and this may be done at least 4-5 years ago. 01/22/2015 -- he has his vascular test is scheduled for this afternoon. 01/29/2015 -- the patient's vascular test was canceled last week because he was unable to get onto the examining bed at [facility]. His Unna's boot was not applied either and the patient had a dressing placed by home health. Today when his dressing was removed we found a lot of maggots in his right lower extremity. 02/07/2015 -- Was seen in the vascular office by the PA and she noted that a bilateral venous duplex study did not show any DVT, SVT or reflux bilaterally. The patient was advised to continue with Unna's boot and would at some stage to require graduated compression stockings of the 20-30 mmHg variety. In the future lymphedema pumps would also be considered. 02/14/2015 -- his dressing is smelling quite a bit and there is a discoloration of the wound suggestive of an [name], [name] ([id number]) infection. Deep tissue cultures will be taken today. 02/21/2015 -- the culture is back and it has growth moderate growth of Proteus and gram-negative rods sensitive to sulfa, ampicillin, cephazolin,  Zosyn. He is already on doxycycline and his wound is looking clean and hence we will continue with this. 02/28/2015 -- he's been doing fine still on his antibiotics and has no fresh issues. 03/07/2015 -- I was told that because he lives in a nursing home the Apligraf was denied by his insurance company. 03/14/2015 -- we are awaiting samples and a trial of Puraply to be tried for his ulceration. 03/21/2015 -- his Apligraf was approved and he is here for his first application of Apligraf 03/28/2015 -- his wound has had quite a bit of secretion and needs to be changed and hence I will use the second application of Apligraf. 04/04/2015 -- he is here for a wound check but has a lot of secretions and hence his dressing needs to be taken down. 04/09/2015 -- he is here for his third application of Apligraf 04/18/2015 -- he is here for a wound check and though he does not have any overt infection he always has a foul odor to his leg. 04/25/2015 -- his wound has been doing much better and he has minimal drainage and the size is smaller. Of note he is on doxycycline. 05/02/2015 -- he is here for his fourth application of Apligraf 05/09/2015 -- he is here for a wound check and is bothered by a lot of odor from the wound. 05/16/2015 -- he has done very well, the odor and drainage is less with the wound dressing having been changed twice this week. He is here for his last application of Apligraf. Objective Constitutional Pulse regular. Respirations normal and unlabored. Afebrile. Vitals Time Taken: 3:01 PM, Height: 68 in, Temperature: 98.2 F, Pulse: 72 bpm, Respiratory Rate: 20 breaths/min, Blood Pressure: 177/68 mmHg. Eyes Nonicteric. Reactive to light. Ears, Nose, Mouth, and Throat Lips, teeth, and gums WNL.Marland Kitchen Moist mucosa without lesions . Neck supple and nontender. No palpable supraclavicular or cervical adenopathy. Normal sized without goiter. Respiratory WNL. No retractions.Marland Kitchen [name], [name] ([id number]) Cardiovascular Pedal Pulses WNL. No clubbing,  cyanosis or edema. Lymphatic No adneopathy. No adenopathy. No adenopathy. Musculoskeletal Adexa without tenderness or enlargement.. Digits and nails w/o clubbing, cyanosis, infection, petechiae, ischemia, or inflammatory conditions.Marland Kitchen Psychiatric Judgement and insight Intact.. No evidence of depression, anxiety, or agitation.. General Notes: the dressing was taken down and the wound washed out with saline and is looking excellent. Integumentary (Hair, Skin) No suspicious lesions. No crepitus or fluctuance. No peri-wound warmth or erythema. No masses.. Wound #5 status is Open. Original cause of wound was Gradually Appeared. The wound is located on the Right,Medial Lower Leg. The wound measures 8cm length x 3cm width x 0.1cm depth; 18.85cm^2 area and 1.885cm^3 volume. 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 I have recommended: 1. They will use a layer of Sorbact, foam, and then Carboflex. 2. we will continue with the 4-layer Profore wrap. [name], [name] ([id number]) 3. He will continue with doxycycline 100 mg by mouth twice a day for 14 days. 4. next week on Friday we will ask his skill nursing staff to apply an Unna's boot as we will be close for the holiday. 5. we will see him back Thursday after Thanksgiving. Electronic Signature(s) Signed: 05/23/2015 3:27:35 PM By: Evlyn Kanner MD, FACS Entered By: Evlyn Kanner on 05/23/2015 15:27:34 [name], REELS  ([id number]) -------------------------------------------------------------------------------- SuperBill Details Patient Name: [name], [name] Date of Service: 05/23/2015 Medical Record Number: [id number] Patient Account Number: [id number] Date of Birth/Sex: 09-30-1935 (79 y.o. 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 E66.01 Morbid (severe) obesity due to excess calories I89.0 Lymphedema, not elsewhere classified Facility Procedures CPT4: Description Modifier Quantity Code [id number] (Facility Use Only) [phone] - APPLY MULTLAY COMPRS LWR RT 1 LEG Physician Procedures CPT4 Code Description: [id number] - WC PHYS LEVEL 3 - EST PT ICD-10 Description Diagnosis I87.311 Chronic venous hypertension (idiopathic) with ulcer of 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 E66.01 Morbid (severe) obesity due  to excess calories I89.0 Lymphedema, not elsewhere classified Modifier: right lower s of muscle Quantity: 1 extremity Electronic Signature(s) Signed: 05/23/2015 4:05:39 PM By: Curtis Sites Signed: 05/23/2015 4:05:55 PM By: Evlyn Kanner MD, FACS Previous Signature: 05/23/2015 3:27:50 PM Version By: Evlyn Kanner MD, FACS Entered By: Curtis Sites on 05/23/2015 16:05:38

## 2015-05-24 NOTE — Progress Notes (Signed)
ELSTON, ALDAPE ([id number]) Visit Report for 05/23/2015 Arrival Information Details Patient Name: CYRUS, RAMSBURG. Date of Service: 05/23/2015 2:45 PM Medical Record Number: [id number] Patient Account Number: [id number] Date of Birth/Sex: 12/07/1935 (79 y.o. Male) Treating RN: Curtis Sites Primary Care Physician: Dorothey Baseman Other Clinician: Referring Physician: Terance Hart, DAVID Treating Physician/Extender: Rudene Re in Treatment: 18 Visit Information History Since Last Visit Added or deleted any medications: No Patient Arrived: Wheel Chair Any new allergies or adverse reactions: No Arrival Time: 14:59 Had a fall or experienced change in No activities of daily living that may affect Accompanied By: self risk of falls: Transfer Assistance: None Signs or symptoms of abuse/neglect since last No Patient Identification Verified: Yes visito Secondary Verification Process Yes Hospitalized since last visit: No Completed: Pain Present Now: No Patient Requires Transmission-Based No Precautions: Patient Has Alerts: No Electronic Signature(s) Signed: 05/23/2015 5:15:54 PM By: Curtis Sites Entered By: Curtis Sites on 05/23/2015 14:59:48 [name] ([id number]) -------------------------------------------------------------------------------- Encounter Discharge Information Details Patient Name: [name]. Date of Service: 05/23/2015 2:45 PM Medical Record Number: [id number] Patient Account Number: [id number] Date of Birth/Sex: 09/16/1935 (79 y.o. Male) Treating RN: Curtis Sites Primary Care Physician: Dorothey Baseman Other Clinician: Referring Physician: Dorothey Baseman Treating Physician/Extender: Rudene Re in Treatment: 41 Encounter Discharge Information Items Discharge Pain Level: 0 Discharge Condition: Stable Ambulatory Status: Wheelchair Discharge Destination: Nursing Home Transportation: Private Auto Accompanied By:  self Schedule Follow-up Appointment: Yes Medication Reconciliation completed and provided to Patient/Care No Yuridia Couts: Provided on Clinical Summary of Care: 05/23/2015 Form Type Recipient Paper Patient [facility] Electronic Signature(s) Signed: 05/23/2015 4:06:33 PM By: Curtis Sites Previous Signature: 05/23/2015 3:45:48 PM Version By: Gwenlyn Perking Entered By: Curtis Sites on 05/23/2015 16:06:33 Marvyn, Torrez Chrissie Noa ([id number]) -------------------------------------------------------------------------------- Lower Extremity Assessment Details Patient Name: [name]. Date of Service: 05/23/2015 2:45 PM Medical Record Number: [id number] Patient Account Number: [id number] Date of Birth/Sex: [birth year] (79 y.o. Male) Treating RN: Curtis Sites Primary Care Physician: Terance Hart, DAVID Other Clinician: Referring Physician: Terance Hart, DAVID Treating Physician/Extender: Rudene Re in Treatment: 18 Edema Assessment Assessed: [Left: No] [Right: No] E[Left: dema] [Right: :] Calf Left: Right: Point of Measurement: 40 cm From Medial Instep cm 37.7 cm Ankle Left: Right: Point of Measurement: 8 cm From Medial Instep cm 28.5 cm Vascular Assessment Pulses: Posterior Tibial Dorsalis Pedis Palpable: [Right:Yes] Extremity colors, hair growth, and conditions: Extremity Color: [Right:Hyperpigmented] Hair Growth on Extremity: [Right:No] Temperature of Extremity: [Right:Warm] Capillary Refill: [Right:< 3 seconds] Toe Nail Assessment Left: Right: Thick: Yes Discolored: Yes Deformed: Yes Improper Length and Hygiene: Yes Electronic Signature(s) Signed: 05/23/2015 5:15:54 PM By: Curtis Sites Entered By: Curtis Sites on 05/23/2015 15:02:22 [name] ([id number]) -------------------------------------------------------------------------------- Multi Wound Chart Details Patient Name: [name]. Date of Service: 05/23/2015 2:45 PM Medical Record Number:  [id number] Patient Account Number: [id number] Date of Birth/Sex: [birth year] (79 y.o. Male) Treating RN: Curtis Sites Primary Care Physician: Dorothey Baseman Other Clinician: Referring Physician: Terance Hart, DAVID Treating Physician/Extender: Rudene Re in Treatment: 18 Vital Signs Height(in): 68 Pulse(bpm): 72 Weight(lbs): Blood Pressure 177/68 (mmHg): Body Mass Index(BMI): Temperature(F): 98.2 Respiratory Rate 20 (breaths/min): Photos: [5:No Photos] [N/A:N/A] Wound Location: [5:Right, Medial Lower Leg] [N/A:N/A] Wounding Event: [5:Gradually Appeared] [N/A:N/A] Primary Etiology: [5:Venous Leg Ulcer] [N/A:N/A] Date Acquired: [5:11/13/2014] [N/A:N/A] Weeks of Treatment: [5:18] [N/A:N/A] Wound Status: [5:Open] [N/A:N/A] Measurements L x W x D 8x3x0.1 [N/A:N/A] (cm) Area (cm) : [5:18.85] [N/A:N/A] Volume (cm) : [5:1.885] [N/A:N/A] % Reduction in Area: [5:81.60%] [N/A:N/A] % Reduction in Volume:  93.90% [N/A:N/A] Classification: [5:Full Thickness Without Exposed Support Structures] [N/A:N/A] Periwound Skin Texture: No Abnormalities Noted [N/A:N/A] Periwound Skin [5:No Abnormalities Noted] [N/A:N/A] Moisture: Periwound Skin Color: No Abnormalities Noted [N/A:N/A] Tenderness on [5:No] [N/A:N/A] Treatment Notes Electronic Signature(s) Signed: 05/23/2015 5:15:54 PM By: Curtis Sites Entered By: Curtis Sites on 05/23/2015 15:09:49 KHURRAM, MARCOUX ([id number]) TAVARI, COVALT ([id number]) -------------------------------------------------------------------------------- Multi-Disciplinary Care Plan Details Patient Name: AAVEN, BIGGIO. Date of Service: 05/23/2015 2:45 PM Medical Record Number: [id number] Patient Account Number: [id number] Date of Birth/Sex: [birth year] (79 y.o. Male) Treating RN: Curtis Sites Primary Care Physician: Dorothey Baseman Other Clinician: Referring Physician: Dorothey Baseman Treating Physician/Extender: Rudene Re  in Treatment: 95 Active Inactive Orientation to the Wound Care Program Nursing Diagnoses: Knowledge deficit related to the wound healing center program Goals: Patient/caregiver will verbalize understanding of the Wound Healing Center Program Date Initiated: 01/15/2015 Goal Status: Active Interventions: Provide education on orientation to the wound center Notes: Venous Leg Ulcer Nursing Diagnoses: Knowledge deficit related to disease process and management Potential for venous Insuffiency (use before diagnosis confirmed) Goals: Patient will maintain optimal edema control Date Initiated: 01/15/2015 Goal Status: Active Patient/caregiver will verbalize understanding of disease process and disease management Date Initiated: 01/15/2015 Goal Status: Active Verify adequate tissue perfusion prior to therapeutic compression application Date Initiated: 01/15/2015 Goal Status: Active Interventions: Assess peripheral edema status every visit. Compression as ordered Provide education on venous insufficiency INDRA, OKELLEY ([id number]) Treatment Activities: Test ordered outside of clinic : 05/23/2015 Therapeutic compression applied : 05/23/2015 Venous Duplex Doppler : 05/23/2015 Notes: Wound/Skin Impairment Nursing Diagnoses: Impaired tissue integrity Knowledge deficit related to ulceration/compromised skin integrity Goals: Patient/caregiver will verbalize understanding of skin care regimen Date Initiated: 01/15/2015 Goal Status: Active Ulcer/skin breakdown will have a volume reduction of 30% by week 4 Date Initiated: 01/15/2015 Goal Status: Active Ulcer/skin breakdown will have a volume reduction of 50% by week 8 Date Initiated: 01/15/2015 Goal Status: Active Ulcer/skin breakdown will have a volume reduction of 80% by week 12 Date Initiated: 01/15/2015 Goal Status: Active Ulcer/skin breakdown will heal within 14 weeks Date Initiated: 01/15/2015 Goal Status:  Active Interventions: Assess patient/caregiver ability to perform ulcer/skin care regimen upon admission and as needed Assess ulceration(s) every visit Provide education on ulcer and skin care Notes: Electronic Signature(s) Signed: 05/23/2015 5:15:54 PM By: Curtis Sites Entered By: Curtis Sites on 05/23/2015 15:09:41 [name] ([id number]) -------------------------------------------------------------------------------- Patient/Caregiver Education Details Patient Name: [name]. Date of Service: 05/23/2015 2:45 PM Medical Record Number: [id number] Patient Account Number: [id number] Date of Birth/Gender: 07-10-1935 (79 y.o. Male) Treating RN: Curtis Sites Primary Care Physician: Dorothey Baseman Other Clinician: Referring Physician: Dorothey Baseman Treating Physician/Extender: Rudene Re in Treatment: 50 Education Assessment Education Provided To: Patient Education Topics Provided Venous: Handouts: Other: POC for holiday week Methods: Explain/Verbal Responses: State content correctly Electronic Signature(s) Signed: 05/23/2015 4:06:51 PM By: Curtis Sites Entered By: Curtis Sites on 05/23/2015 16:06:51 [name] ([id number]) -------------------------------------------------------------------------------- Wound Assessment Details Patient Name: DIMA, HOAK. Date of Service: 05/23/2015 2:45 PM Medical Record Number: [id number] Patient Account Number: [id number] Date of Birth/Sex: Jun 17, 1936 (79 y.o. Male) Treating RN: Curtis Sites Primary Care Physician: Dorothey Baseman Other Clinician: Referring Physician: Terance Hart, DAVID Treating Physician/Extender: Rudene Re in Treatment: 18 Wound Status Wound Number: 5 Primary Etiology: Venous Leg Ulcer Wound Location: Right, Medial Lower Leg Wound Status: Open Wounding Event: Gradually Appeared Date Acquired: 11/13/2014 Weeks Of Treatment: 18 Clustered Wound:  No Photos Photo Uploaded By: Curtis Sites on 05/23/2015 16:10:33  Wound Measurements Length: (cm) 8 Width: (cm) 3 Depth: (cm) 0.1 Area: (cm) 18.85 Volume: (cm) 1.885 % Reduction in Area: 81.6% % Reduction in Volume: 93.9% Wound Description Full Thickness Without Exposed Classification: Support Structures Periwound Skin Texture Texture Color No Abnormalities Noted: No No Abnormalities Noted: No Moisture No Abnormalities Noted: No Treatment Notes Wound #5 (Right, Medial Lower Leg) ELYON, ZOLL. ([id number]) 1. Cleansed with: Cleanse wound with antibacterial soap and water 4. Dressing Applied: Other dressing (specify in notes) 5. Secondary Dressing Applied Foam 7. Secured with 4-Layer Compression System - Right Lower Extremity Notes sorbact, siltec, carboflex Electronic Signature(s) Signed: 05/23/2015 5:15:54 PM By: Curtis Sites Entered By: Curtis Sites on 05/23/2015 15:09:35 Slayde, Brault Chrissie Noa ([id number]) -------------------------------------------------------------------------------- Vitals Details Patient Name: [name]. Date of Service: 05/23/2015 2:45 PM Medical Record Number: [id number] Patient Account Number: [id number] Date of Birth/Sex: 10-16-1935 (79 y.o. Male) Treating RN: Curtis Sites Primary Care Physician: Dorothey Baseman Other Clinician: Referring Physician: Terance Hart, DAVID Treating Physician/Extender: Rudene Re in Treatment: 18 Vital Signs Time Taken: 15:01 Temperature (F): 98.2 Height (in): 68 Pulse (bpm): 72 Respiratory Rate (breaths/min): 20 Blood Pressure (mmHg): 177/68 Reference Range: 80 - 120 mg / dl Electronic Signature(s) Signed: 05/23/2015 5:15:54 PM By: Curtis Sites Entered By: Curtis Sites on 05/23/2015 15:00:50

## 2015-06-06 ENCOUNTER — Encounter: Payer: Medicare Other | Attending: Surgery | Admitting: Surgery

## 2015-06-06 DIAGNOSIS — I10 Essential (primary) hypertension: Secondary | ICD-10-CM | POA: Insufficient documentation

## 2015-06-06 DIAGNOSIS — G20 Parkinson's disease: Secondary | ICD-10-CM | POA: Insufficient documentation

## 2015-06-06 DIAGNOSIS — I87311 Chronic venous hypertension (idiopathic) with ulcer of right lower extremity: Secondary | ICD-10-CM | POA: Diagnosis not present

## 2015-06-06 DIAGNOSIS — I890 Lymphedema, not elsewhere classified: Secondary | ICD-10-CM | POA: Insufficient documentation

## 2015-06-06 DIAGNOSIS — L97313 Non-pressure chronic ulcer of right ankle with necrosis of muscle: Secondary | ICD-10-CM | POA: Insufficient documentation

## 2015-06-07 NOTE — Progress Notes (Signed)
OMARR, HANN ([id number]) Visit Report for 06/06/2015 Arrival Information Details Patient Name: [name], [name] Date of Service: 06/06/2015 2:15 PM Medical Record Number: [id number] Patient Account Number: [id number] Date of Birth/Sex: 1936/05/30 (79 y.o. Male) Treating RN: Clover Mealy, RN, BSN, [place] Sink Primary Care Physician: Terance Hart, DAVID Other Clinician: Referring Physician: Terance Hart, DAVID Treating Physician/Extender: Rudene Re in Treatment: 20 Visit Information History Since Last Visit Any new allergies or adverse reactions: No Patient Arrived: Wheel Chair Had a fall or experienced change in No activities of daily living that may affect Arrival Time: 14:39 risk of falls: Accompanied By: self Signs or symptoms of abuse/neglect since last No Transfer Assistance: None visito Patient Identification Verified: Yes Has Dressing in Place as Prescribed: Yes Secondary Verification Process Yes Has Compression in Place as Prescribed: Yes Completed: Pain Present Now: No Patient Requires Transmission-Based No Precautions: Patient Has Alerts: No Electronic Signature(s) Signed: 06/06/2015 2:40:21 PM By: Elpidio Eric BSN, RN Entered By: Elpidio Eric on 06/06/2015 14:40:21 [name] ([id number]) -------------------------------------------------------------------------------- Encounter Discharge Information Details Patient Name: [name] Date of Service: 06/06/2015 2:15 PM Medical Record Number: [id number] Patient Account Number: [id number] Date of Birth/Sex: 1935-08-26 (79 y.o. Male) Treating RN: Clover Mealy, RN, BSN, [place] Sink Primary Care Physician: Terance Hart, DAVID Other Clinician: Referring Physician: Terance Hart, DAVID Treating Physician/Extender: Rudene Re in Treatment: 20 Encounter Discharge Information Items Discharge Pain Level: 0 Discharge Condition: Stable Ambulatory Status: Wheelchair Discharge Destination: Nursing Home Transportation:  Other self, Accompanied By: transportation aide Schedule Follow-up Appointment: No Medication Reconciliation completed and provided to No Patient/Care Maurianna Benard: Provided on Clinical Summary of Care: 06/06/2015 Form Type Recipient Paper Patient [facility] Electronic Signature(s) Signed: 06/06/2015 3:24:29 PM By: Elpidio Eric BSN, RN Previous Signature: 06/06/2015 3:18:21 PM Version By: Gwenlyn Perking Entered By: Elpidio Eric on 06/06/2015 15:24:29 [name] ([id number]) -------------------------------------------------------------------------------- Lower Extremity Assessment Details Patient Name: [name] Date of Service: 06/06/2015 2:15 PM Medical Record Number: [id number] Patient Account Number: [id number] Date of Birth/Sex: March 22, 1936 (79 y.o. Male) Treating RN: Clover Mealy, RN, BSN, [place] Sink Primary Care Physician: Terance Hart, DAVID Other Clinician: Referring Physician: Terance Hart, DAVID Treating Physician/Extender: Rudene Re in Treatment: 20 Edema Assessment Assessed: [Left: No] [Right: No] Edema: [Left: Ye] [Right: s] Calf Left: Right: Point of Measurement: 40 cm From Medial Instep cm 37.6 cm Ankle Left: Right: Point of Measurement: 8 cm From Medial Instep cm 28.5 cm Vascular Assessment Claudication: Claudication Assessment [Right:None] Pulses: Posterior Tibial Dorsalis Pedis Palpable: [Right:Yes] Extremity colors, hair growth, and conditions: Extremity Color: [Right:Hyperpigmented] Hair Growth on Extremity: [Right:No] Temperature of Extremity: [Right:Warm] Capillary Refill: [Right:< 3 seconds] Toe Nail Assessment Left: Right: Thick: Yes Discolored: Yes Deformed: Yes Improper Length and Hygiene: Yes Electronic Signature(s) Signed: 06/06/2015 2:39:48 PM By: Elpidio Eric BSN, RN Entered By: Elpidio Eric on 06/06/2015 14:39:48 [name], [name] ([id number]) [name], [name]  ([id number]) -------------------------------------------------------------------------------- Multi Wound Chart Details Patient Name: [name] Date of Service: 06/06/2015 2:15 PM Medical Record Number: [id number] Patient Account Number: [id number] Date of Birth/Sex: 1935/07/17 (79 y.o. Male) Treating RN: Clover Mealy, RN, BSN, [place] Sink Primary Care Physician: Terance Hart, DAVID Other Clinician: Referring Physician: Terance Hart, DAVID Treating Physician/Extender: Rudene Re in Treatment: 20 Vital Signs Height(in): 68 Pulse(bpm): 72 Weight(lbs): Blood Pressure 118/75 (mmHg): Body Mass Index(BMI): Temperature(F): 97.9 Respiratory Rate 18 (breaths/min): Photos: [5:No Photos] [N/A:N/A] Wound Location: [5:Right Lower Leg - Medial] [N/A:N/A] Wounding Event: [5:Gradually Appeared] [N/A:N/A] Primary Etiology: [5:Venous Leg Ulcer] [N/A:N/A] Comorbid History: [5:Cataracts, Anemia, Hypertension, Osteoarthritis] [N/A:N/A] Date Acquired: [5:11/13/2014] [N/A:N/A] Weeks of Treatment: [  5:20] [N/A:N/A] Wound Status: [5:Open] [N/A:N/A] Measurements L x W x D 6.5x3x0.1 [N/A:N/A] (cm) Area (cm) : [5:15.315] [N/A:N/A] Volume (cm) : [5:1.532] [N/A:N/A] % Reduction in Area: [5:85.10%] [N/A:N/A] % Reduction in Volume: 95.00% [N/A:N/A] Classification: [5:Full Thickness Without Exposed Support Structures] [N/A:N/A] Exudate Amount: [5:Medium] [N/A:N/A] Exudate Type: [5:Purulent] [N/A:N/A] Exudate Color: [5:yellow, brown, green] [N/A:N/A] Foul Odor After [5:Yes] [N/A:N/A] Cleansing: Odor Anticipated Due to No [N/A:N/A] Product Use: Wound Margin: [5:Distinct, outline attached] [N/A:N/A] Granulation Amount: [5:Medium (34-66%)] [N/A:N/A] Granulation Quality: [5:Pink, Pale] [N/A:N/A] Necrotic Amount: [5:Medium (34-66%)] [N/A:N/A] Exposed Structures: Fascia: No N/A N/A Fat: No Tendon: No Muscle: No Joint: No Bone: No Limited to Skin Breakdown Epithelialization: Medium (34-66%) N/A  N/A Periwound Skin Texture: Edema: Yes N/A N/A Excoriation: No Induration: No Callus: No Crepitus: No Fluctuance: No Friable: No Rash: No Scarring: No Periwound Skin Moist: Yes N/A N/A Moisture: Maceration: No Dry/Scaly: No Periwound Skin Color: Atrophie Blanche: No N/A N/A Cyanosis: No Ecchymosis: No Erythema: No Hemosiderin Staining: No Mottled: No Pallor: No Rubor: No Temperature: No Abnormality N/A N/A Tenderness on No N/A N/A Palpation: Wound Preparation: Ulcer Cleansing: N/A N/A Rinsed/Irrigated with Saline Topical Anesthetic Applied: Other: lidocaine 4% Treatment Notes Electronic Signature(s) Signed: 06/06/2015 2:58:49 PM By: Elpidio Eric BSN, RN Entered By: Elpidio Eric on 06/06/2015 14:58:49 [name], [name] ([id number]) -------------------------------------------------------------------------------- Multi-Disciplinary Care Plan Details Patient Name: [name], [name] Date of Service: 06/06/2015 2:15 PM Medical Record Number: [id number] Patient Account Number: [id number] Date of Birth/Sex: [birth year] (79 y.o. Male) Treating RN: Clover Mealy, RN, BSN, [place] Sink Primary Care Physician: Terance Hart, DAVID Other Clinician: Referring Physician: Terance Hart, DAVID Treating Physician/Extender: Rudene Re in Treatment: 20 Active Inactive Orientation to the Wound Care Program Nursing Diagnoses: Knowledge deficit related to the wound healing center program Goals: Patient/caregiver will verbalize understanding of the Wound Healing Center Program Date Initiated: 01/15/2015 Goal Status: Active Interventions: Provide education on orientation to the wound center Notes: Venous Leg Ulcer Nursing Diagnoses: Knowledge deficit related to disease process and management Potential for venous Insuffiency (use before diagnosis confirmed) Goals: Patient will maintain optimal edema control Date Initiated: 01/15/2015 Goal Status: Active Patient/caregiver will verbalize  understanding of disease process and disease management Date Initiated: 01/15/2015 Goal Status: Active Verify adequate tissue perfusion prior to therapeutic compression application Date Initiated: 01/15/2015 Goal Status: Active Interventions: Assess peripheral edema status every visit. Compression as ordered Provide education on venous insufficiency [name], [name] ([id number]) Treatment Activities: Test ordered outside of clinic : 06/06/2015 Therapeutic compression applied : 06/06/2015 Venous Duplex Doppler : 06/06/2015 Notes: Wound/Skin Impairment Nursing Diagnoses: Impaired tissue integrity Knowledge deficit related to ulceration/compromised skin integrity Goals: Patient/caregiver will verbalize understanding of skin care regimen Date Initiated: 01/15/2015 Goal Status: Active Ulcer/skin breakdown will have a volume reduction of 30% by week 4 Date Initiated: 01/15/2015 Goal Status: Active Ulcer/skin breakdown will have a volume reduction of 50% by week 8 Date Initiated: 01/15/2015 Goal Status: Active Ulcer/skin breakdown will have a volume reduction of 80% by week 12 Date Initiated: 01/15/2015 Goal Status: Active Ulcer/skin breakdown will heal within 14 weeks Date Initiated: 01/15/2015 Goal Status: Active Interventions: Assess patient/caregiver ability to perform ulcer/skin care regimen upon admission and as needed Assess ulceration(s) every visit Provide education on ulcer and skin care Notes: Electronic Signature(s) Signed: 06/06/2015 2:58:42 PM By: Elpidio Eric BSN, RN Entered By: Elpidio Eric on 06/06/2015 14:58:41 [name], [name] ([id number]) -------------------------------------------------------------------------------- Pain Assessment Details Patient Name: [name] Date of Service: 06/06/2015 2:15 PM Medical Record Number: [id number] Patient Account Number:  [id number] Date of Birth/Sex: 24-Jul-1935 (79 y.o. Male) Treating RN: Clover Mealy, RN, BSN, [place] Sink Primary  Care Physician: Terance Hart, DAVID Other Clinician: Referring Physician: Terance Hart, DAVID Treating Physician/Extender: Rudene Re in Treatment: 20 Active Problems Location of Pain Severity and Description of Pain Patient Has Paino No Site Locations Pain Management and Medication Current Pain Management: Electronic Signature(s) Signed: 06/06/2015 2:40:37 PM By: Elpidio Eric BSN, RN Entered By: Elpidio Eric on 06/06/2015 14:40:37 [name] ([id number]) -------------------------------------------------------------------------------- Patient/Caregiver Education Details Patient Name: [name] Date of Service: 06/06/2015 2:15 PM Medical Record Number: [id number] Patient Account Number: [id number] Date of Birth/Gender: [birth year] (79 y.o. Male) Treating RN: Clover Mealy, RN, BSN, [place] Sink Primary Care Physician: Terance Hart, DAVID Other Clinician: Referring Physician: Terance Hart DAVID Treating Physician/Extender: Rudene Re in Treatment: 20 Education Assessment Education Provided To: Patient Education Topics Provided Basic Hygiene: Methods: Explain/Verbal Responses: State content correctly Venous: Methods: Explain/Verbal Responses: State content correctly Welcome To The Wound Care Center: Methods: Explain/Verbal Responses: State content correctly Wound/Skin Impairment: Methods: Explain/Verbal Responses: State content correctly Electronic Signature(s) Signed: 06/06/2015 3:24:50 PM By: Elpidio Eric BSN, RN Entered By: Elpidio Eric on 06/06/2015 15:24:49 [name] ([id number]) -------------------------------------------------------------------------------- Wound Assessment Details Patient Name: [name] Date of Service: 06/06/2015 2:15 PM Medical Record Number: [id number] Patient Account Number: [id number] Date of Birth/Sex: 04/04/1936 (79 y.o. Male) Treating RN: Clover Mealy, RN, BSN, [place] Primary Care Physician: Terance Hart, DAVID Other  Clinician: Referring Physician: Terance Hart, DAVID Treating Physician/Extender: Rudene Re in Treatment: 20 Wound Status Wound Number: 5 Primary Venous Leg Ulcer Etiology: Wound Location: Right Lower Leg - Medial Wound Status: Open Wounding Event: Gradually Appeared Comorbid Cataracts, Anemia, Hypertension, Date Acquired: 11/13/2014 History: Osteoarthritis Weeks Of Treatment: 20 Clustered Wound: No Photos Photo Uploaded By: Elpidio Eric on 06/06/2015 16:41:33 Wound Measurements Length: (cm) 6.5 Width: (cm) 3 Depth: (cm) 0.1 Area: (cm) 15.315 Volume: (cm) 1.532 % Reduction in Area: 85.1% % Reduction in Volume: 95% Epithelialization: Medium (34-66%) Tunneling: No Undermining: No Wound Description Full Thickness Without Exposed Classification: Support Structures Wound Margin: Distinct, outline attached Exudate Medium Amount: [name], [name] ([id number]) Foul Odor After Cleansing: Yes Due to Product Use: No Exudate Type: Purulent Exudate Color: yellow, brown, green Wound Bed Granulation Amount: Medium (34-66%) Exposed Structure Granulation Quality: Pink, Pale Fascia Exposed: No Necrotic Amount: Medium (34-66%) Fat Layer Exposed: No Necrotic Quality: Adherent Slough Tendon Exposed: No Muscle Exposed: No Joint Exposed: No Bone Exposed: No Limited to Skin Breakdown Periwound Skin Texture Texture Color No Abnormalities Noted: No No Abnormalities Noted: No Callus: No Atrophie Blanche: No Crepitus: No Cyanosis: No Excoriation: No Ecchymosis: No Fluctuance: No Erythema: No Friable: No Hemosiderin Staining: No Induration: No Mottled: No Localized Edema: Yes Pallor: No Rash: No Rubor: No Scarring: No Temperature / Pain Moisture Temperature: No Abnormality No Abnormalities Noted: No Dry / Scaly: No Maceration: No Moist: Yes Wound Preparation Ulcer Cleansing: Rinsed/Irrigated with Saline Topical Anesthetic Applied: Other: lidocaine  4%, Treatment Notes Wound #5 (Right, Medial Lower Leg) 1. Cleansed with: Cleanse wound with antibacterial soap and water 3. Peri-wound Care: Moisturizing lotion 4. Dressing Applied: Promogran Other dressing (specify in notes) 5. Secondary Dressing Applied ABD Pad [name], [name] ([id number]) 7. Secured with 4-Layer Compression System - Right Lower Extremity Notes sorbact, drawtex, carboflex Electronic Signature(s) Signed: 06/06/2015 2:58:24 PM By: Elpidio Eric BSN, RN Entered By: Elpidio Eric on 06/06/2015 14:58:23 [name], [name] ([id number]) -------------------------------------------------------------------------------- Vitals Details Patient Name: [name] Date of Service: 06/06/2015 2:15 PM Medical Record Number: [id number]  Patient Account Number: [id number] Date of Birth/Sex: 07/31/1935 (79 y.o. Male) Treating RN: Clover Mealy, RN, BSN, [place] Sink Primary Care Physician: Terance Hart, DAVID Other Clinician: Referring Physician: Terance Hart, DAVID Treating Physician/Extender: Rudene Re in Treatment: 20 Vital Signs Time Taken: 14:53 Temperature (F): 97.9 Height (in): 68 Pulse (bpm): 72 Respiratory Rate (breaths/min): 18 Blood Pressure (mmHg): 118/75 Reference Range: 80 - 120 mg / dl Electronic Signature(s) Signed: 06/06/2015 2:57:14 PM By: Elpidio Eric BSN, RN Previous Signature: 06/06/2015 2:53:04 PM Version By: Elpidio Eric BSN, RN Entered By: Elpidio Eric on 06/06/2015 14:57:14

## 2015-06-07 NOTE — Progress Notes (Signed)
RILLEY, STASH ([id number]) Visit Report for 06/06/2015 Chief Complaint Document Details Patient Name: [name] Date of Service: 06/06/2015 2:15 PM Medical Record Number: [id number] Patient Account Number: [id number] Date of Birth/Sex: 1936/06/18 (79 y.o. Male) Treating RN: Clover Mealy, RN, BSN, [place] Sink Primary Care Physician: Terance Hart, DAVID Other Clinician: Referring Physician: Dorothey Baseman Treating Physician/Extender: Rudene Re in Treatment: 20 Information Obtained from: Patient Chief Complaint Patient presents for treatment of an open ulcer due to venous insufficiency. The patient has had a open wound on his right medial ankle for at least 3 years and was last seen in the wound clinic in February 2015. He was lost to follow-up after that. Electronic Signature([name]) Signed: 06/06/2015 3:14:31 PM By: Evlyn Kanner MD, FACS Entered By: Evlyn Kanner on 06/06/2015 15:14:31 [name], [name] ([id number]) -------------------------------------------------------------------------------- Debridement Details Patient Name: [name], [name] Date of Service: 06/06/2015 2:15 PM Medical Record Number: [id number] Patient Account Number: [id number] Date of Birth/Sex: July 15, 1935 (79 y.o. Male) Treating RN: Clover Mealy, RN, BSN, Rita Primary Care Physician: Terance Hart, DAVID Other Clinician: Referring Physician: Terance Hart, DAVID Treating Physician/Extender: Rudene Re in Treatment: 20 Debridement Performed for Wound #5 Right,Medial Lower Leg Assessment: Performed By: Physician Evlyn Kanner, MD Debridement: Debridement Pre-procedure Yes Verification/Time Out Taken: Start Time: 14:58 Pain Control: Lidocaine 4% Topical Solution Level: Skin/Subcutaneous Tissue Total Area Debrided (L x 6.5 (cm) x 3 (cm) = 19.5 (cm) W): Tissue and other Non-Viable, Exudate, Fibrin/Slough, Subcutaneous material debrided: Instrument: Curette Bleeding: Minimum Hemostasis Achieved:  Pressure End Time: 15:01 Procedural Pain: 0 Post Procedural Pain: 0 Response to Treatment: Procedure was tolerated well Post Debridement Measurements of Total Wound Length: (cm) 6.5 Width: (cm) 3 Depth: (cm) 0.1 Volume: (cm) 1.532 Post Procedure Diagnosis Same as Pre-procedure Electronic Signature([name]) Signed: 06/06/2015 3:14:24 PM By: Evlyn Kanner MD, FACS Signed: 06/06/2015 5:26:04 PM By: Elpidio Eric BSN, RN Previous Signature: 06/06/2015 3:00:32 PM Version By: Elpidio Eric BSN, RN Entered By: Evlyn Kanner on 06/06/2015 15:14:24 [name], [name] ([id number]) -------------------------------------------------------------------------------- HPI Details Patient Name: [name], [name] Date of Service: 06/06/2015 2:15 PM Medical Record Number: [id number] Patient Account Number: [id number] Date of Birth/Sex: [birth year] (79 y.o. Male) Treating RN: Clover Mealy, RN, BSN, [place] Sink Primary Care Physician: Terance Hart, DAVID Other Clinician: Referring Physician: Terance Hart, DAVID Treating Physician/Extender: Rudene Re in Treatment: 20 History of Present Illness Location: right lower extremity ulceration Quality: Patient reports experiencing a dull pain to affected area([name]). Severity: Patient states wound are getting worse. 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 Modifying Factors: Consults to this date include:has received treatment in the wound center in the past but stopped coming here since February 2015 Associated Signs and Symptoms: Patient reports having difficulty standing for long periods. HPI Description: 79 year old male who is known to have progressive weakness and has been in a nursing home  for a while has had chronic lower extremity edema both legs and a large open wound on the right lower extremity which is has at least for about 3-4 years. The patient was seen in the wound clinic before and has been treated until February 2015 when he was lost to follow-up. Past medical history is significant for hypertension, gout, venous stasis ulcers, Parkinson'[name]  [name] disease, constipation. He'[name] not been a diabetic but his last hemoglobin A1c was 6 in March 2015. There is documentation that he'[name] had endovenous ablation of bilateral varicose veins but we have no documentation about this and this may be done at least 4-5 years ago. 01/22/2015 -- he has his vascular test is scheduled for this afternoon. 01/29/2015 -- the patient'[name] vascular test was canceled last week because he was unable to get onto the examining bed at [facility]. His Unna'[name] boot was not applied either and the patient had a dressing placed by home health. Today when his dressing was removed we found a lot of maggots in his right lower extremity. 02/07/2015 -- Was seen in the vascular office by the PA and she noted that a bilateral venous duplex study did not show any DVT, SVT or reflux bilaterally. The patient was advised to continue with Unna'[name] boot and would at some stage to require graduated compression stockings of the 20-30 mmHg variety. In the future lymphedema pumps would also be considered. 02/14/2015 -- his dressing is smelling quite a bit and there is a discoloration of the wound suggestive of an infection. Deep tissue cultures will be taken today. 02/21/2015 -- the culture is back and it has growth moderate growth of Proteus and gram-negative rods sensitive to sulfa, ampicillin, cephazolin, Zosyn. He is already on doxycycline and his wound is looking clean and hence we will continue with this. 02/28/2015 -- he'[name] been doing fine still on his antibiotics and has no fresh issues. 03/07/2015 -- I was told that because he  lives in a nursing home the Apligraf was denied by his insurance company. 03/14/2015 -- we are awaiting samples and a trial of Puraply to be tried for his ulceration. 03/21/2015 -- his Apligraf was approved and he is here for his first application of Apligraf 03/28/2015 -- his wound has had quite a bit of secretion and needs to be changed and hence I will use the JAMIRE, SHABAZZ. ([id number]) second application of Apligraf. 04/04/2015 -- he is here for a wound check but has a lot of secretions and hence his dressing needs to be taken down. 04/09/2015 -- he is here for his third application of Apligraf 04/18/2015 -- he is here for a wound check and though he does not have any overt infection he always has a foul odor to his leg. 04/25/2015 -- his wound has been doing much better and he has minimal drainage and the size is smaller. Of note he is on doxycycline. 05/02/2015 -- he is here for his fourth application of Apligraf 05/09/2015 -- he is here for a wound check and is bothered by a lot of odor from the wound. 05/16/2015 -- he has done very well, the odor and drainage is less with the wound dressing having been changed twice this week. He is here for his last application of Apligraf. Electronic Signature([name]) Signed: 06/06/2015 3:14:42 PM By: Evlyn Kanner MD, FACS Entered By: Evlyn Kanner on 06/06/2015 15:14:42 [name], [name] ([id number]) -------------------------------------------------------------------------------- Physical Exam Details Patient Name: [name], [name] Date of Service: 06/06/2015 2:15 PM Medical Record Number: [id number] Patient Account Number: [id number] Date of Birth/Sex: 04-Jan-1936 (79 y.o. Male) Treating RN: Clover Mealy, RN, BSN, [place] Sink Primary Care Physician: Terance Hart, DAVID Other Clinician: Referring Physician: Terance Hart, DAVID Treating Physician/Extender: Rudene Re in Treatment: 20 Constitutional . Pulse regular. Respirations normal and unlabored.  Afebrile. . Eyes Nonicteric. Reactive to light. Ears, Nose, Mouth, and Throat Lips, teeth, and gums WNL.Marland Kitchen Moist  mucosa without lesions . Neck supple and nontender. No palpable supraclavicular or cervical adenopathy. Normal sized without goiter. Respiratory WNL. No retractions.. Cardiovascular Pedal Pulses WNL. No clubbing, cyanosis or edema. Lymphatic No adneopathy. No adenopathy. No adenopathy. Musculoskeletal Adexa without tenderness or enlargement.. Digits and nails w/o clubbing, cyanosis, infection, petechiae, ischemia, or inflammatory conditions.. Integumentary (Hair, Skin) No suspicious lesions. No crepitus or fluctuance. No peri-wound warmth or erythema. No masses.Marland Kitchen Psychiatric Judgement and insight Intact.. No evidence of depression, anxiety, or agitation.. Notes the wound does not look as good as last time I saw it and has significant amount of slimy debris on it and has a bit of order. He will each operative debridement today. Electronic Signature([name]) Signed: 06/06/2015 3:15:25 PM By: Evlyn Kanner MD, FACS Entered By: Evlyn Kanner on 06/06/2015 15:15:24 HAMMOND, OBEIRNE ([id number]) -------------------------------------------------------------------------------- Physician Orders Details Patient Name: [name], [name] Date of Service: 06/06/2015 2:15 PM Medical Record Number: [id number] Patient Account Number: [id number] Date of Birth/Sex: 03/15/1936 (79 y.o. Male) Treating RN: Clover Mealy, RN, BSN, [place] Sink Primary Care Physician: Terance Hart, DAVID Other Clinician: Referring Physician: Terance Hart DAVID Treating Physician/Extender: Rudene Re in Treatment: 20 Verbal / Phone Orders: Yes Clinician: Afful, RN, BSN, Rita Read Back and Verified: Yes Diagnosis Coding Wound Cleansing Wound #5 Right,Medial Lower Leg o Cleanse wound with mild soap and water o No tub bath. Skin Barriers/Peri-Wound Care Wound #5 Right,Medial Lower Leg o Barrier cream o  Moisturizing lotion Primary Wound Dressing Wound #5 Right,Medial Lower Leg o Promogran o Other: - Sorbact Secondary Dressing Wound #5 Right,Medial Lower Leg o ABD pad o Drawtex Dressing Change Frequency Wound #5 Right,Medial Lower Leg o Change dressing every week Follow-up Appointments Wound #5 Right,Medial Lower Leg o Return Appointment in 1 week. Edema Control Wound #5 Right,Medial Lower Leg o 4-Layer Compression System - Right Lower Extremity Additional Orders / Instructions Wound #5 Right,Medial Lower Leg o Other: - Carboflex for odor control [name], [name] ([id number]) Electronic Signature([name]) Signed: 06/06/2015 3:03:39 PM By: Elpidio Eric BSN, RN Signed: 06/06/2015 4:31:17 PM By: Evlyn Kanner MD, FACS Entered By: Elpidio Eric on 06/06/2015 15:03:39 BENETT, SWOYER ([id number]) -------------------------------------------------------------------------------- Problem List Details Patient Name: [name], [name] Date of Service: 06/06/2015 2:15 PM Medical Record Number: [id number] Patient Account Number: [id number] Date of Birth/Sex: 1936-01-06 (79 y.o. 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 Date of Service: 06/06/2015 2:15 PM Medical Record Number: [id number] Patient Account Number: [id number] Date of Birth/Sex: 04/11/1936 (79 y.o. Male) Treating RN: Clover Mealy, RN, BSN, [place] Sink Primary Care Physician: Terance Hart, DAVID Other Clinician: Referring Physician: Dorothey Baseman Treating Physician/Extender: Rudene Re in Treatment: 20 Subjective Chief Complaint Information obtained from Patient Patient presents for treatment of an open ulcer due to venous insufficiency. The patient has had a open wound on his right medial ankle for at least 3 years and was last seen in the wound clinic in  February 2015. He was lost to follow-up after that. History of Present Illness (HPI) The following HPI elements were documented for the patient'[name] wound: Location: right lower extremity ulceration Quality: Patient reports experiencing a dull pain to affected area([name]). Severity: Patient states wound are getting worse. 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 Modifying Factors: Consults to this date include:has received treatment in the wound center in the past but stopped coming here since February 2015 Associated Signs and Symptoms: Patient reports having difficulty standing for long periods. 79 year old male who is known to have progressive weakness and has been in a nursing home for a while has had chronic lower extremity edema both legs and a large open wound on the right lower extremity which is has at least for about 3-4 years. The patient was seen in the  wound clinic before and has been treated until February 2015 when he was lost to follow-up. Past medical history is significant for hypertension, gout, venous stasis ulcers, Parkinson'[name] [name] disease, constipation. He'[name] not been a diabetic but his last hemoglobin A1c was 6 in March 2015. There is documentation that he'[name] had endovenous ablation of bilateral varicose veins but we have no documentation about this and this may be done at least 4-5 years ago. 01/22/2015 -- he has his vascular test is scheduled for this afternoon. 01/29/2015 -- the patient'[name] vascular test was canceled last week because he was unable to get onto the examining bed at [facility]. His Unna'[name] boot was not applied either and the patient had a dressing placed by home health. Today when his dressing was removed we found a lot of maggots in his right lower extremity. 02/07/2015 -- Was seen in the vascular office by the PA and she noted that a bilateral venous duplex study did not show any DVT, SVT or reflux bilaterally. The patient was advised to continue with Unna'[name] boot and would at some stage to require graduated compression stockings of the 20-30 mmHg variety. In the future lymphedema pumps would also be considered. 02/14/2015 -- his dressing is smelling quite a bit and there is a discoloration of the wound suggestive of an [name], [name] ([id number]) infection. Deep tissue cultures will be taken today. 02/21/2015 -- the culture is back and it has growth moderate growth of Proteus and gram-negative rods sensitive to sulfa, ampicillin, cephazolin, Zosyn. He is already on doxycycline and his wound is looking clean and hence we will continue with this. 02/28/2015 -- he'[name] been doing fine still on his antibiotics and has no fresh issues. 03/07/2015 -- I was told that because he lives in a nursing home the Apligraf was denied by his insurance company. 03/14/2015 -- we are awaiting samples and a trial of Puraply to be tried for  his ulceration. 03/21/2015 -- his Apligraf was approved and he is here for his first application of Apligraf 03/28/2015 -- his wound has had quite a bit of secretion and needs to be changed and hence I will use the second application of Apligraf. 04/04/2015 -- he is here for a wound check but has a lot of secretions and hence his dressing needs to be taken down. 04/09/2015 -- he is here for  his third application of Apligraf 04/18/2015 -- he is here for a wound check and though he does not have any overt infection he always has a foul odor to his leg. 04/25/2015 -- his wound has been doing much better and he has minimal drainage and the size is smaller. Of note he is on doxycycline. 05/02/2015 -- he is here for his fourth application of Apligraf 05/09/2015 -- he is here for a wound check and is bothered by a lot of odor from the wound. 05/16/2015 -- he has done very well, the odor and drainage is less with the wound dressing having been changed twice this week. He is here for his last application of Apligraf. Objective Constitutional Pulse regular. Respirations normal and unlabored. Afebrile. Vitals Time Taken: 2:53 PM, Height: 68 in, Temperature: 97.9 F, Pulse: 72 bpm, Respiratory Rate: 18 breaths/min, Blood Pressure: 118/75 mmHg. Eyes Nonicteric. Reactive to light. Ears, Nose, Mouth, and Throat Lips, teeth, and gums WNL.Marland Kitchen Moist mucosa without lesions . Neck supple and nontender. No palpable supraclavicular or cervical adenopathy. Normal sized without goiter. Respiratory WNL. No retractions.Marland Kitchen [name], BIDINGER ([id number]) Cardiovascular Pedal Pulses WNL. No clubbing, cyanosis or edema. Lymphatic No adneopathy. No adenopathy. No adenopathy. Musculoskeletal Adexa without tenderness or enlargement.. Digits and nails w/o clubbing, cyanosis, infection, petechiae, ischemia, or inflammatory conditions.Marland Kitchen Psychiatric Judgement and insight Intact.. No evidence of depression, anxiety,  or agitation.. General Notes: the wound does not look as good as last time I saw it and has significant amount of slimy debris on it and has a bit of order. He will each operative debridement today. Integumentary (Hair, Skin) No suspicious lesions. No crepitus or fluctuance. No peri-wound warmth or erythema. No masses.. Wound #5 status is Open. Original cause of wound was Gradually Appeared. The wound is located on the Right,Medial Lower Leg. The wound measures 6.5cm length x 3cm width x 0.1cm depth; 15.315cm^2 area and 1.532cm^3 volume. The wound is limited to skin breakdown. There is no tunneling or undermining noted. There is a medium amount of purulent drainage noted. The wound margin is distinct with the outline attached to the wound base. There is medium (34-66%) pink, pale granulation within the wound bed. There is a medium (34-66%) amount of necrotic tissue within the wound bed including Adherent Slough. The periwound skin appearance exhibited: Localized Edema, Moist. The periwound skin appearance did not exhibit: Callus, Crepitus, Excoriation, Fluctuance, Friable, Induration, Rash, Scarring, Dry/Scaly, Maceration, Atrophie Blanche, Cyanosis, Ecchymosis, Hemosiderin Staining, Mottled, Pallor, Rubor, Erythema. Periwound temperature was noted as No Abnormality. 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 There was a Skin/Subcutaneous Tissue Debridement ([id number]) debridement with total area of 19.5 sq cm performed by Evlyn Kanner, MD. with the following instrument([name]): Curette to remove Non-Viable  tissue/material including Exudate, Fibrin/Slough, and Subcutaneous after achieving pain control using Lidocaine 4% Topical Solution. A time out was conducted prior to the start of the procedure. A Minimum amount of bleeding was controlled with Pressure. The procedure was tolerated well with a pain level of 0 throughout and a pain level of 0 following the procedure. Post Debridement Measurements: 6.5cm length x 3cm width x 0.1cm depth; 1.532cm^3 volume. Post procedure  Diagnosis Wound #5: Same as Pre-Procedure Plan Wound Cleansing: Wound #5 Right,Medial Lower Leg: Cleanse wound with mild soap and water No tub bath. Skin Barriers/Peri-Wound Care: Wound #5 Right,Medial Lower Leg: Barrier cream Moisturizing lotion Primary Wound Dressing: Wound #5 Right,Medial Lower Leg: Promogran Other: - Sorbact Secondary Dressing: Wound #5 Right,Medial Lower Leg: ABD pad Drawtex Dressing Change Frequency: Wound #5 Right,Medial Lower Leg: Change dressing every week Follow-up Appointments: Wound #5 Right,Medial Lower Leg: Return Appointment in 1 week. Edema Control: Wound #5 Right,Medial Lower Leg: 4-Layer Compression System - Right Lower Extremity Additional Orders / Instructions: Wound #5 Right,Medial Lower Leg: Other: - Carboflex for odor control IRL, BODIE ([id number]) I have recommended: 1. They will use a layer of Prisma Ag, Sorbact, foam, and then Carboflex. 2. we will continue with the 4-layer Profore wrap. 3. He will continue with doxycycline 100 mg by mouth twice a day for 14 days. 4. we will see him back next week. Electronic Signature([name]) Signed: 06/06/2015 3:16:22 PM By: Evlyn Kanner MD, FACS Entered By: Evlyn Kanner on 06/06/2015 15:16:22 AYRON, FILLINGER ([id number]) -------------------------------------------------------------------------------- SuperBill Details Patient Name: SHIZUO, BISKUP. Date of Service: 06/06/2015 Medical Record Number: [id number] Patient  Account Number: [id number] Date of Birth/Sex: [birth year] (79 y.o. 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 E66.01 Morbid (severe) obesity due to excess calories I89.0 Lymphedema, not elsewhere classified Facility Procedures CPT4 Code Description: [id number] 11042 - DEB SUBQ TISSUE 20 SQ CM/< ICD-10 Description Diagnosis I87.311 Chronic venous hypertension (idiopathic) with ulcer of 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 E66.01 Morbid (severe) obesity  due to excess calories I89.0 Lymphedema, not elsewhere classified Modifier: right lowe [name] of muscle Quantity: 1 r extremity Physician Procedures CPT4 Code Description: [id number] 11042 - WC PHYS SUBQ TISS 20 SQ CM ICD-10 Description Diagnosis I87.311 Chronic venous hypertension (idiopathic) with ulcer of 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 E66.01 Morbid (severe) obesity  due to excess calories I89.0 Lymphedema, not elsewhere classified Modifier: right lower [name] of muscle Quantity: 1 extremity Electronic Signature([name]) Signed: 06/06/2015 3:17:03 PM By: Evlyn Kanner MD, FACS Previous Signature: 06/06/2015 3:16:42 PM Version By: Evlyn Kanner MD, FACS Entered By: Evlyn Kanner on 06/06/2015 15:17:03

## 2015-06-13 ENCOUNTER — Encounter: Payer: Medicare Other | Admitting: Surgery

## 2015-06-13 DIAGNOSIS — I87311 Chronic venous hypertension (idiopathic) with ulcer of right lower extremity: Secondary | ICD-10-CM | POA: Diagnosis not present

## 2015-06-14 NOTE — Progress Notes (Addendum)
AMON, COSTILLA ([id number]) Visit Report for 06/13/2015 Chief Complaint Document Details Patient Name: [name], [name] Date of Service: 06/13/2015 2:45 PM Medical Record Number: [id number] Patient Account Number: [id number] Date of Birth/Sex: 1935-07-28 (79 y.o. Male) Treating RN: Clover Mealy, RN, BSN, [place] Sink Primary Care Physician: Terance Hart, DAVID Other Clinician: Referring Physician: Dorothey Baseman Treating Physician/Extender: Rudene Re in Treatment: 21 Information Obtained from: Patient Chief Complaint Patient presents for treatment of an open ulcer due to venous insufficiency. The patient has had a open wound on his right medial ankle for at least 3 years and was last seen in the wound clinic in February 2015. He was lost to follow-up after that. Electronic Signature(s) Signed: 06/13/2015 3:02:47 PM By: Evlyn Kanner MD, FACS Entered By: Evlyn Kanner on 06/13/2015 15:02:47 ZEDEKIAH, HINDERMAN ([id number]) -------------------------------------------------------------------------------- HPI Details Patient Name: [name], [name] Date of Service: 06/13/2015 2:45 PM Medical Record Number: [id number] Patient Account Number: [id number] Date of Birth/Sex: [birth year] (79 y.o. Male) Treating RN: Clover Mealy, RN, BSN, [place] Sink Primary Care Physician: Terance Hart, DAVID Other Clinician: Referring Physician: Terance Hart, DAVID Treating Physician/Extender: Rudene Re in Treatment: 21 History of Present Illness Location: right lower extremity ulceration Quality: Patient reports experiencing a dull pain to affected area(s). Severity: Patient states wound are getting worse. 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 Modifying Factors: Consults to this date include:has received treatment in the wound center in the past but stopped coming here since February 2015 Associated Signs and Symptoms: Patient reports having difficulty standing for long periods. HPI Description: 79 year old male who is known to have progressive weakness and has been in a nursing home for a while has had chronic lower extremity edema both legs and a large open wound on the right lower extremity which is has at least for about 3-4 years. The patient was seen in the wound clinic before and has been treated until February 2015 when he was lost to follow-up. Past medical history is significant for hypertension, gout, venous stasis ulcers, Parkinson's [name], constipation. He's not been a diabetic but his last hemoglobin A1c was 6 in March 2015. There is documentation that he's had endovenous ablation of bilateral varicose veins but we have no documentation about this and this may be done at least 4-5 years ago. 01/22/2015 -- he has his vascular test is scheduled for this afternoon. 01/29/2015 -- the patient's vascular test was canceled last week because he was unable to get onto the examining bed at [facility]. His Unna's boot was not applied either and the patient had a dressing placed by home health. Today when his dressing was removed we found a lot of maggots in his right lower extremity. 02/07/2015 -- Was seen in the vascular office by the PA and she noted that a bilateral venous duplex study did not show any DVT, SVT or reflux bilaterally. The patient was advised to continue with Unna's boot and would at some stage to require graduated compression stockings of the 20-30 mmHg variety. In the future lymphedema pumps would also be considered. 02/14/2015 -- his dressing is smelling quite a bit and there is a discoloration of the wound suggestive of an infection. Deep tissue cultures will  be taken today. 02/21/2015 -- the  culture is back and it has growth moderate growth of Proteus and gram-negative rods sensitive to sulfa, ampicillin, cephazolin, Zosyn. He is already on doxycycline and his wound is looking clean and hence we will continue with this. 02/28/2015 -- he's been doing fine still on his antibiotics and has no fresh issues. 03/07/2015 -- I was told that because he lives in a nursing home the Apligraf was denied by his insurance company. 03/14/2015 -- we are awaiting samples and a trial of Puraply to be tried for his ulceration. 03/21/2015 -- his Apligraf was approved and he is here for his first application of Apligraf 03/28/2015 -- his wound has had quite a bit of secretion and needs to be changed and hence I will use the DAMEIR, GENTZLER. ([id number]) second application of Apligraf. 04/04/2015 -- he is here for a wound check but has a lot of secretions and hence his dressing needs to be taken down. 04/09/2015 -- he is here for his third application of Apligraf 04/18/2015 -- he is here for a wound check and though he does not have any overt infection he always has a foul odor to his leg. 04/25/2015 -- his wound has been doing much better and he has minimal drainage and the size is smaller. Of note he is on doxycycline. 05/02/2015 -- he is here for his fourth application of Apligraf 05/09/2015 -- he is here for a wound check and is bothered by a lot of odor from the wound. 05/16/2015 -- he has done very well, the odor and drainage is less with the wound dressing having been changed twice this week. He is here for his last application of Apligraf. Electronic Signature(s) Signed: 06/13/2015 3:03:02 PM By: Evlyn Kanner MD, FACS Entered By: Evlyn Kanner on 06/13/2015 15:03:02 TRAVONE, GEORG ([id number]) -------------------------------------------------------------------------------- Physical Exam Details Patient Name: [name], [name] Date of Service: 06/13/2015 2:45 PM Medical Record Number:  [id number] Patient Account Number: [id number] Date of Birth/Sex: March 19, 1936 (79 y.o. Male) Treating RN: Clover Mealy, RN, BSN, [place] Sink Primary Care Physician: Terance Hart, DAVID Other Clinician: Referring Physician: Terance Hart, DAVID Treating Physician/Extender: Rudene Re in Treatment: 21 Constitutional . Pulse regular. Respirations normal and unlabored. Afebrile. . Eyes Nonicteric. Reactive to light. Ears, Nose, Mouth, and Throat Lips, teeth, and gums WNL.Marland Kitchen Moist mucosa without lesions . Neck supple and nontender. No palpable supraclavicular or cervical adenopathy. Normal sized without goiter. Respiratory WNL. No retractions.. Cardiovascular Pedal Pulses WNL. No clubbing, cyanosis or edema. Lymphatic No adneopathy. No adenopathy. No adenopathy. Musculoskeletal Adexa without tenderness or enlargement.. Digits and nails w/o clubbing, cyanosis, infection, petechiae, ischemia, or inflammatory conditions.. Integumentary (Hair, Skin) No suspicious lesions. No crepitus or fluctuance. No peri-wound warmth or erythema. No masses.Marland Kitchen Psychiatric Judgement and insight Intact.. No evidence of depression, anxiety, or agitation.. Notes the wound looks much better today with no debris or order and no debridement was required today. Electronic Signature(s) Signed: 06/13/2015 3:03:38 PM By: Evlyn Kanner MD, FACS Entered By: Evlyn Kanner on 06/13/2015 15:03:37 ARTAVIUS, STEARNS ([id number]) -------------------------------------------------------------------------------- Physician Orders Details Patient Name: ATTILA, MCCARTHY. Date of Service: 06/13/2015 2:45 PM Medical Record Number: [id number] Patient Account Number: [id number] Date of Birth/Sex: 1936-01-08 (79 y.o. 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 Skin Barriers/Peri-Wound Care Wound #5 Right,Medial Lower Leg o Barrier cream o Moisturizing lotion Primary Wound Dressing Wound #5 Right,Medial Lower Leg o Promogran o Other: - Sorbact Secondary Dressing Wound #5 Right,Medial Lower Leg o ABD pad o Drawtex Dressing Change Frequency Wound #5 Right,Medial Lower Leg o Change dressing every week Follow-up Appointments Wound #5 Right,Medial Lower Leg o Return Appointment in 1 week. VEDH, PTACEK ([id number]) Edema Control Wound #5 Right,Medial Lower Leg o 4-Layer Compression System - Right Lower Extremity Additional Orders / Instructions Wound #5 Right,Medial Lower Leg o Other: - Carboflex for odor control Electronic Signature(s) Signed: 06/13/2015 3:04:27 PM By: Elpidio Eric BSN, RN Signed: 06/13/2015 3:39:43 PM By: Evlyn Kanner MD, FACS Entered By: Elpidio Eric on 06/13/2015 15:04:27 OLIVER, HEITZENRATER ([id number]) -------------------------------------------------------------------------------- Problem List Details Patient Name: [name], [name] Date of Service: 06/13/2015 2:45 PM Medical Record Number: [id number] Patient Account Number: [id number] Date of Birth/Sex: Jan 18, 1936 (79 y.o. 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 Date of Service: 06/13/2015 2:45 PM Medical Record Number: [id number] Patient Account Number: [id number] Date of Birth/Sex: 08-Apr-1936 (79 y.o. Male) Treating RN: Clover Mealy, RN, BSN, [place] Sink Primary Care Physician: Terance Hart, DAVID Other Clinician: Referring Physician: Dorothey Baseman Treating Physician/Extender: Rudene Re in Treatment: 21 Subjective Chief Complaint Information obtained from Patient Patient presents for treatment of an open ulcer due to venous insufficiency. The patient has had a open wound on his right medial ankle for at least 3 years and was last seen in the wound clinic in February 2015. He was lost to follow-up after that. History of Present Illness (HPI) The following HPI elements were documented for the patient's wound: Location: right lower extremity ulceration Quality: Patient reports experiencing a dull pain to affected area(s). Severity: Patient states wound are getting worse. 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 Modifying Factors: Consults to this date include:has received treatment in the wound center in the past but stopped coming here since February 2015 Associated Signs and Symptoms: Patient reports having difficulty standing for long periods. 79 year old male who is known to have progressive weakness and has been in a nursing home for a while has had chronic lower extremity edema both legs and a large open wound on the right lower  extremity which is has at least for about 3-4 years. The patient was seen in the wound clinic before and has been treated until February 2015 when he was lost to follow-up. Past medical history is significant for hypertension, gout, venous stasis ulcers, Parkinson's [name], constipation. He's not been a diabetic but his last hemoglobin A1c was 6 in March 2015. There is documentation that he's had endovenous ablation of bilateral varicose veins but we have no documentation about this and this may be done at least 4-5 years ago. 01/22/2015 -- he has his vascular test is scheduled for this afternoon. 01/29/2015 -- the patient's vascular test was canceled last week because he was unable to get onto the examining bed at [facility]. His Unna's boot was not applied either and the patient had a dressing placed by home health. Today when his dressing was removed we found a lot of maggots in his right lower extremity. 02/07/2015 -- Was seen in the vascular office by the PA and she noted that a bilateral venous duplex study did not show any DVT, SVT or reflux bilaterally. The patient was advised to continue with Unna's boot and would at some stage to require graduated compression stockings of the 20-30 mmHg variety. In the future lymphedema pumps would also be considered. 02/14/2015 -- his dressing is smelling quite a bit and there is a discoloration of the wound suggestive of an SAMBA, CUMBA. ([id number]) infection. Deep tissue cultures will be taken  today. 02/21/2015 -- the culture is back and it has growth moderate growth of Proteus and gram-negative rods sensitive to sulfa, ampicillin, cephazolin, Zosyn. He is already on doxycycline and his wound is looking clean and hence we will continue with this. 02/28/2015 -- he's been doing fine still on his antibiotics and has no fresh issues. 03/07/2015 -- I was told that because he lives in a nursing home the Apligraf was denied by his insurance company. 03/14/2015 -- we are awaiting samples and a trial of Puraply to be tried for his ulceration. 03/21/2015 -- his Apligraf was approved and he is here for his first application of Apligraf 03/28/2015 -- his wound has had quite a bit of secretion and needs to be changed and hence I will use the second application of Apligraf. 04/04/2015 -- he is here for a wound check but has a lot of secretions and hence his dressing needs to be taken down. 04/09/2015 -- he is here for his third application of Apligraf 04/18/2015 -- he is here for a wound check and though he does not have any overt infection he always has a foul odor to his leg. 04/25/2015 -- his wound has been doing much better and he has minimal drainage and the size is smaller. Of note he is on doxycycline. 05/02/2015 -- he is here for his fourth application of Apligraf 05/09/2015 -- he is here for a wound check and is bothered by a lot of odor from the wound. 05/16/2015 -- he has done very well, the odor and drainage is less with the wound dressing having been changed twice this week. He is here for his last application of Apligraf. Objective Constitutional Pulse regular. Respirations normal and unlabored. Afebrile. Vitals Time Taken: 2:47 PM, Height: 68 in, Temperature: 97.6 F, Pulse: 92 bpm, Respiratory Rate: 18 breaths/min, Blood Pressure: 129/96 mmHg. Eyes Nonicteric. Reactive to light. Ears, Nose, Mouth, and Throat Lips, teeth, and gums WNL.Marland Kitchen Moist mucosa without lesions  . Neck supple and nontender. No palpable supraclavicular or  cervical adenopathy. Normal sized without goiter. Respiratory WNL. No retractions.Marland Kitchen JOBANNY, MAVIS ([id number]) Cardiovascular Pedal Pulses WNL. No clubbing, cyanosis or edema. Lymphatic No adneopathy. No adenopathy. No adenopathy. Musculoskeletal Adexa without tenderness or enlargement.. Digits and nails w/o clubbing, cyanosis, infection, petechiae, ischemia, or inflammatory conditions.Marland Kitchen Psychiatric Judgement and insight Intact.. No evidence of depression, anxiety, or agitation.. General Notes: the wound looks much better today with no debris or order and no debridement was required today. Integumentary (Hair, Skin) No suspicious lesions. No crepitus or fluctuance. No peri-wound warmth or erythema. No masses.. Wound #5 status is Open. Original cause of wound was Gradually Appeared. The wound is located on the Right,Medial Lower Leg. The wound measures 6.5cm length x 4cm width x 0.1cm depth; 20.42cm^2 area and 2.042cm^3 volume. 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 I have recommended: 1. They will use a layer of collagen, Sorbact, foam, and then Carboflex. 2. we will continue with the 4-layer Profore wrap. 3. He will continue with doxycycline 100 mg by mouth twice a day. 4. we will see him back next week. SHAILEN, THIELEN ([id number]) Plan It has been about a month since his last application of Apligraf and the wound is slow to completely close. I have recommended: 1. They will use a layer of collagen, Sorbact, foam, and then Carboflex. 2. we will  continue with the 4-layer Profore wrap. 3. He will continue with doxycycline 100 mg by mouth twice a day. 4. we will see him back next week. Electronic Signature(s) Signed: 06/13/2015 3:04:38 PM By: Evlyn Kanner MD, FACS Entered By: Evlyn Kanner on 06/13/2015 15:04:38 LUKAH, GOSWAMI ([id number]) -------------------------------------------------------------------------------- SuperBill Details Patient Name: BURLON, [name] Date of Service: 06/13/2015 Medical Record Number: [id number] Patient Account Number: [id number] Date of Birth/Sex: 08-04-1935 (79 y.o. 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 E66.01 Morbid (severe) obesity due to excess calories I89.0 Lymphedema, not elsewhere classified Facility Procedures CPT4: Description Modifier Quantity Code [id number] (Facility Use Only) [phone] - APPLY MULTLAY COMPRS LWR RT 1 LEG Physician Procedures CPT4 Code Description: [id number] - WC PHYS LEVEL 3 - EST PT ICD-10 Description Diagnosis I87.311 Chronic venous hypertension (idiopathic) with ulcer of 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 E66.01 Morbid (severe) obesity due  to excess calories I89.0 Lymphedema, not elsewhere classified Modifier: right lower s of muscle Quantity: 1 extremity Electronic Signature(s) Signed: 06/13/2015 3:17:50 PM By: Elpidio Eric BSN, RN Signed: 06/13/2015 3:39:43 PM By: Evlyn Kanner MD, FACS Previous Signature: 06/13/2015 3:05:06 PM Version By: Evlyn Kanner MD, FACS Entered By: Elpidio Eric on 06/13/2015 15:17:50

## 2015-06-14 NOTE — Progress Notes (Signed)
DEVION, CHRISCOE ([id number]) Visit Report for 06/13/2015 Arrival Information Details Patient Name: [name], [name] Date of Service: 06/13/2015 2:45 PM Medical Record Number: [id number] Patient Account Number: [id number] Date of Birth/Sex: [birth year] (79 y.o. Male) Treating RN: Clover Mealy, RN, BSN, [place] Sink Primary Care Physician: Terance Hart, DAVID Other Clinician: Referring Physician: Terance Hart, DAVID Treating Physician/Extender: Rudene Re in Treatment: 21 Visit Information History Since Last Visit Added or deleted any medications: No Patient Arrived: Wheel Chair Had a fall or experienced change in No activities of daily living that may affect Arrival Time: 14:44 risk of falls: Accompanied By: self Signs or symptoms of abuse/neglect since last No Transfer Assistance: Manual visito Patient Identification Verified: Yes Has Dressing in Place as Prescribed: Yes Secondary Verification Process Yes Has Compression in Place as Prescribed: Yes Completed: Pain Present Now: No Patient Requires Transmission-Based No Precautions: Patient Has Alerts: No Electronic Signature(s) Signed: 06/13/2015 2:47:24 PM By: Elpidio Eric BSN, RN Entered By: Elpidio Eric on 06/13/2015 14:47:24 [name] ([id number]) -------------------------------------------------------------------------------- Encounter Discharge Information Details Patient Name: [name] Date of Service: 06/13/2015 2:45 PM Medical Record Number: [id number] Patient Account Number: [id number] Date of Birth/Sex: 11-Mar-1936 (79 y.o. Male) Treating RN: Clover Mealy, RN, BSN, [place] Sink Primary Care Physician: Terance Hart, DAVID Other Clinician: Referring Physician: Terance Hart, DAVID Treating Physician/Extender: Rudene Re in Treatment: 21 Encounter Discharge Information Items Discharge Pain Level: 0 Discharge Condition: Stable Ambulatory Status: Wheelchair Discharge Destination: Home Transportation: Other Accompanied By:  self Schedule Follow-up Appointment: No Medication Reconciliation completed and provided to Patient/Care No [place] K[place]: Provided on Clinical Summary of Care: 06/13/2015 Form Type Recipient Paper Patient [facility] Electronic Signature(s) Signed: 06/13/2015 3:16:45 PM By: Elpidio Eric BSN, RN Previous Signature: 06/13/2015 3:13:28 PM Version By: Gwenlyn Perking Entered By: Elpidio Eric on 06/13/2015 15:16:45 [name] ([id number]) -------------------------------------------------------------------------------- Lower Extremity Assessment Details Patient Name: [name] Date of Service: 06/13/2015 2:45 PM Medical Record Number: [id number] Patient Account Number: [id number] Date of Birth/Sex: Aug 13, 1935 (79 y.o. Male) Treating RN: Clover Mealy, RN, BSN, [place] Sink Primary Care Physician: Terance Hart, DAVID Other Clinician: Referring Physician: Terance Hart, DAVID Treating Physician/Extender: Rudene Re in Treatment: 21 Edema Assessment Assessed: [Left: No] [Right: No] E[Left: dema] [Right: :] Calf Left: Right: Point of Measurement: 40 cm From Medial Instep cm 37.8 cm Ankle Left: Right: Point of Measurement: 8 cm From Medial Instep cm 27.9 cm Vascular Assessment Pulses: Posterior Tibial Dorsalis Pedis Palpable: [Right:Yes] Extremity colors, hair growth, and conditions: Extremity Color: [Right:Hyperpigmented] Hair Growth on Extremity: [Right:No] Temperature of Extremity: [Right:Warm] Capillary Refill: [Right:< 3 seconds] Toe Nail Assessment Left: Right: Thick: Yes Discolored: Yes Deformed: Yes Improper Length and Hygiene: Yes Electronic Signature(s) Signed: 06/13/2015 2:48:46 PM By: Elpidio Eric BSN, RN Entered By: Elpidio Eric on 06/13/2015 14:48:46 [name] ([id number]) -------------------------------------------------------------------------------- Multi Wound Chart Details Patient Name: [name] Date of Service: 06/13/2015 2:45 PM Medical Record Number:  [id number] Patient Account Number: [id number] Date of Birth/Sex: October 08, 1935 (79 y.o. Male) Treating RN: Clover Mealy, RN, BSN, [place] Sink Primary Care Physician: Terance Hart, DAVID Other Clinician: Referring Physician: Terance Hart, DAVID Treating Physician/Extender: Rudene Re in Treatment: 21 Vital Signs Height(in): 68 Pulse(bpm): 92 Weight(lbs): Blood Pressure 129/96 (mmHg): Body Mass Index(BMI): Temperature(F): 97.6 Respiratory Rate 18 (breaths/min): Photos: [5:No Photos] [N/A:N/A] Wound Location: [5:Right Lower Leg - Medial] [N/A:N/A] Wounding Event: [5:Gradually Appeared] [N/A:N/A] Primary Etiology: [5:Venous Leg Ulcer] [N/A:N/A] Comorbid History: [5:Cataracts, Anemia, Hypertension, Osteoarthritis] [N/A:N/A] Date Acquired: [5:11/13/2014] [N/A:N/A] Weeks of Treatment: [5:21] [N/A:N/A] Wound Status: [5:Open] [N/A:N/A] Measurements L x W x D 6.5x4x0.1 [  N/A:N/A] (cm) Area (cm) : [5:20.42] [N/A:N/A] Volume (cm) : [5:2.042] [N/A:N/A] % Reduction in Area: [5:80.10%] [N/A:N/A] % Reduction in Volume: 93.40% [N/A:N/A] Classification: [5:Full Thickness Without Exposed Support Structures] [N/A:N/A] Exudate Amount: [5:Medium] [N/A:N/A] Exudate Type: [5:Serosanguineous] [N/A:N/A] Exudate Color: [5:red, brown] [N/A:N/A] Foul Odor After [5:Yes] [N/A:N/A] Cleansing: Odor Anticipated Due to No [N/A:N/A] Product Use: Wound Margin: [5:Distinct, outline attached] [N/A:N/A] Granulation Amount: [5:Medium (34-66%)] [N/A:N/A] Granulation Quality: [5:Pink, Pale] [N/A:N/A] Necrotic Amount: [5:Medium (34-66%)] [N/A:N/A] Exposed Structures: Fascia: No N/A N/A Fat: No Tendon: No Muscle: No Joint: No Bone: No Limited to Skin Breakdown Epithelialization: Medium (34-66%) N/A N/A Periwound Skin Texture: Edema: Yes N/A N/A Excoriation: No Induration: No Callus: No Crepitus: No Fluctuance: No Friable: No Rash: No Scarring: No Periwound Skin Moist: Yes N/A N/A Moisture: Maceration:  No Dry/Scaly: No Periwound Skin Color: Atrophie Blanche: No N/A N/A Cyanosis: No Ecchymosis: No Erythema: No Hemosiderin Staining: No Mottled: No Pallor: No Rubor: No Temperature: No Abnormality N/A N/A Tenderness on No N/A N/A Palpation: Wound Preparation: Ulcer Cleansing: N/A N/A Rinsed/Irrigated with Saline Topical Anesthetic Applied: Other: lidocaine 4% Treatment Notes Electronic Signature(s) Signed: 06/13/2015 3:03:59 PM By: Elpidio Eric BSN, RN Entered By: Elpidio Eric on 06/13/2015 15:03:59 [name], [name] ([id number]) -------------------------------------------------------------------------------- Multi-Disciplinary Care Plan Details Patient Name: [name], [name] Date of Service: 06/13/2015 2:45 PM Medical Record Number: [id number] Patient Account Number: [id number] Date of Birth/Sex: 1935/12/19 (79 y.o. Male) Treating RN: Clover Mealy, RN, BSN, [place] Sink Primary Care Physician: Terance Hart, DAVID Other Clinician: Referring Physician: Terance Hart, DAVID Treating Physician/Extender: Rudene Re in Treatment: 21 Active Inactive Orientation to the Wound Care Program Nursing Diagnoses: Knowledge deficit related to the wound healing center program Goals: Patient/caregiver will verbalize understanding of the Wound Healing Center Program Date Initiated: 01/15/2015 Goal Status: Active Interventions: Provide education on orientation to the wound center Notes: Venous Leg Ulcer Nursing Diagnoses: Knowledge deficit related to disease process and management Potential for venous Insuffiency (use before diagnosis confirmed) Goals: Patient will maintain optimal edema control Date Initiated: 01/15/2015 Goal Status: Active Patient/caregiver will verbalize understanding of disease process and disease management Date Initiated: 01/15/2015 Goal Status: Active Verify adequate tissue perfusion prior to therapeutic compression application Date Initiated: 01/15/2015 Goal Status:  Active Interventions: Assess peripheral edema status every visit. Compression as ordered Provide education on venous insufficiency EZEKEIL, BETHEL ([id number]) Treatment Activities: Test ordered outside of clinic : 06/13/2015 Therapeutic compression applied : 06/13/2015 Venous Duplex Doppler : 06/13/2015 Notes: Wound/Skin Impairment Nursing Diagnoses: Impaired tissue integrity Knowledge deficit related to ulceration/compromised skin integrity Goals: Patient/caregiver will verbalize understanding of skin care regimen Date Initiated: 01/15/2015 Goal Status: Active Ulcer/skin breakdown will have a volume reduction of 30% by week 4 Date Initiated: 01/15/2015 Goal Status: Active Ulcer/skin breakdown will have a volume reduction of 50% by week 8 Date Initiated: 01/15/2015 Goal Status: Active Ulcer/skin breakdown will have a volume reduction of 80% by week 12 Date Initiated: 01/15/2015 Goal Status: Active Ulcer/skin breakdown will heal within 14 weeks Date Initiated: 01/15/2015 Goal Status: Active Interventions: Assess patient/caregiver ability to perform ulcer/skin care regimen upon admission and as needed Assess ulceration(s) every visit Provide education on ulcer and skin care Notes: Electronic Signature(s) Signed: 06/13/2015 3:03:50 PM By: Elpidio Eric BSN, RN Entered By: Elpidio Eric on 06/13/2015 15:03:50 [name], [name] ([id number]) -------------------------------------------------------------------------------- Pain Assessment Details Patient Name: [name] Date of Service: 06/13/2015 2:45 PM Medical Record Number: [id number] Patient Account Number: [id number] Date of Birth/Sex: 10/15/1935 (79 y.o. Male) Treating RN: Clover Mealy, RN, BSN, [employer]  Primary Care Physician: Terance Hart, DAVID Other Clinician: Referring Physician: Dorothey Baseman Treating Physician/Extender: Rudene Re in Treatment: 21 Active Problems Location of Pain Severity and Description of  Pain Patient Has Paino No Site Locations Pain Management and Medication Current Pain Management: Electronic Signature(s) Signed: 06/13/2015 2:47:30 PM By: Elpidio Eric BSN, RN Entered By: Elpidio Eric on 06/13/2015 14:47:30 [name] ([id number]) -------------------------------------------------------------------------------- Patient/Caregiver Education Details Patient Name: [name] Date of Service: 06/13/2015 2:45 PM Medical Record Number: [id number] Patient Account Number: [id number] Date of Birth/Gender: January 16, 1936 (79 y.o. Male) Treating RN: Clover Mealy, RN, BSN, [place] Sink Primary Care Physician: Terance Hart, DAVID Other Clinician: Referring Physician: Terance Hart DAVID Treating Physician/Extender: Rudene Re in Treatment: 21 Education Assessment Education Provided To: Patient Education Topics Provided Venous: Methods: Explain/Verbal Responses: State content correctly Welcome To The Wound Care Center: Methods: Explain/Verbal Responses: State content correctly Wound/Skin Impairment: Methods: Explain/Verbal Responses: State content correctly Electronic Signature(s) Signed: 06/13/2015 3:17:01 PM By: Elpidio Eric BSN, RN Entered By: Elpidio Eric on 06/13/2015 15:17:01 [name] ([id number]) -------------------------------------------------------------------------------- Wound Assessment Details Patient Name: [name] Date of Service: 06/13/2015 2:45 PM Medical Record Number: [id number] Patient Account Number: [id number] Date of Birth/Sex: 01-24-1936 (79 y.o. Male) Treating RN: Clover Mealy, RN, BSN, [place] Primary Care Physician: Terance Hart, DAVID Other Clinician: Referring Physician: Terance Hart, DAVID Treating Physician/Extender: Rudene Re in Treatment: 21 Wound Status Wound Number: 5 Primary Venous Leg Ulcer Etiology: Wound Location: Right Lower Leg - Medial Wound Status: Open Wounding Event: Gradually Appeared Comorbid Cataracts, Anemia,  Hypertension, Date Acquired: 11/13/2014 History: Osteoarthritis Weeks Of Treatment: 21 Clustered Wound: No Photos Photo Uploaded By: Elpidio Eric on 06/13/2015 15:41:48 Wound Measurements Length: (cm) 6.5 Width: (cm) 4 Depth: (cm) 0.1 Area: (cm) 20.42 Volume: (cm) 2.042 % Reduction in Area: 80.1% % Reduction in Volume: 93.4% Epithelialization: Medium (34-66%) Tunneling: No Undermining: No Wound Description Full Thickness Without Exposed Classification: Support Structures Wound Margin: Distinct, outline attached Exudate Medium Amount: Exudate Type: Serosanguineous Exudate Color: red, brown Foul Odor After Cleansing: Yes Due to Product Use: No Wound Bed Granulation Amount: Medium (34-66%) Exposed Structure Granulation Quality: Pink, Pale Fascia Exposed: No Necrotic Amount: Medium (34-66%) Fat Layer Exposed: No [name], [name] ([id number]) Necrotic Quality: Adherent Slough Tendon Exposed: No Muscle Exposed: No Joint Exposed: No Bone Exposed: No Limited to Skin Breakdown Periwound Skin Texture Texture Color No Abnormalities Noted: No No Abnormalities Noted: No Callus: No Atrophie Blanche: No Crepitus: No Cyanosis: No Excoriation: No Ecchymosis: No Fluctuance: No Erythema: No Friable: No Hemosiderin Staining: No Induration: No Mottled: No Localized Edema: Yes Pallor: No Rash: No Rubor: No Scarring: No Temperature / Pain Moisture Temperature: No Abnormality No Abnormalities Noted: No Dry / Scaly: No Maceration: No Moist: Yes Wound Preparation Ulcer Cleansing: Rinsed/Irrigated with Saline Topical Anesthetic Applied: Other: lidocaine 4%, Treatment Notes Wound #5 (Right, Medial Lower Leg) 1. Cleansed with: Cleanse wound with antibacterial soap and water 3. Peri-wound Care: Moisturizing lotion 4. Dressing Applied: Promogran Other dressing (specify in notes) 5. Secondary Dressing Applied ABD Pad 7. Secured with 4-Layer Compression System -  Right Lower Extremity Notes sorbact, drawtex, carboflex Electronic Signature(s) Signed: 06/13/2015 3:03:43 PM By: Elpidio Eric BSN, RN [name], [name] ([id number]) Entered By: Elpidio Eric on 06/13/2015 15:03:42 [name], [name] ([id number]) -------------------------------------------------------------------------------- Vitals Details Patient Name: [name], [name] Date of Service: 06/13/2015 2:45 PM Medical Record Number: [id number] Patient Account Number: [id number] Date of Birth/Sex: Apr 25, 1936 (79 y.o. Male) Treating RN: Clover Mealy, RN, BSN, [place] Sink Primary Care Physician: Terance Hart, DAVID Other  Clinician: Referring Physician: Terance Hart, DAVID Treating Physician/Extender: Rudene Re in Treatment: 21 Vital Signs Time Taken: 14:47 Temperature (F): 97.6 Height (in): 68 Pulse (bpm): 92 Respiratory Rate (breaths/min): 18 Blood Pressure (mmHg): 129/96 Reference Range: 80 - 120 mg / dl Electronic Signature(s) Signed: 06/13/2015 2:48:06 PM By: Elpidio Eric BSN, RN Entered By: Elpidio Eric on 06/13/2015 14:48:06

## 2015-06-20 ENCOUNTER — Encounter: Payer: Medicare Other | Admitting: Surgery

## 2015-06-20 DIAGNOSIS — I87311 Chronic venous hypertension (idiopathic) with ulcer of right lower extremity: Secondary | ICD-10-CM | POA: Diagnosis not present

## 2015-06-21 NOTE — Progress Notes (Signed)
[name], [name] ([id number]) Visit Report for 06/20/2015 Chief Complaint Document Details Patient Name: [name], [name] Date of Service: 06/20/2015 2:15 PM Medical Record Number: [id number] Patient Account Number: [id number] Date of Birth/Sex: 1935/11/09 (79 y.o. Male) Treating RN: Clover Mealy, RN, BSN, [place] Sink Primary Care Physician: Terance Hart, DAVID Other Clinician: Referring Physician: Dorothey Baseman Treating Physician/Extender: Rudene Re in Treatment: 22 Information Obtained from: Patient Chief Complaint Patient presents for treatment of an open ulcer due to venous insufficiency. The patient has had a open wound on his right medial ankle for at least 3 years and was last seen in the wound clinic in February 2015. He was lost to follow-up after that. Electronic Signature(s) Signed: 06/20/2015 3:25:04 PM By: Evlyn Kanner MD, FACS Entered By: Evlyn Kanner on 06/20/2015 15:25:04 [name], [name] ([id number]) -------------------------------------------------------------------------------- HPI Details Patient Name: [name], [name] Date of Service: 06/20/2015 2:15 PM Medical Record Number: [id number] Patient Account Number: [id number] Date of Birth/Sex: 10-12-1935 (79 y.o. Male) Treating RN: Clover Mealy, RN, BSN,  Sink Primary Care Physician: Terance Hart, DAVID Other Clinician: Referring Physician: Terance Hart, DAVID Treating Physician/Extender: Rudene Re in Treatment: 22 History of Present Illness Location: right lower extremity ulceration Quality: Patient reports experiencing a dull pain to affected area(s). Severity: Patient states wound are getting worse. 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 Modifying Factors: Consults to this date include:has received treatment in the wound center in the past but stopped coming here since February 2015 Associated Signs and Symptoms: Patient reports having difficulty standing for long periods. HPI Description: 79 year old male who is known to have progressive weakness and has been in a nursing home for a while has had chronic lower extremity edema both legs and a large open wound on the right lower extremity which is has at least for about 3-4 years. The patient was seen in the wound clinic before and has been treated until February 2015 when he was lost to follow-up. Past medical history is significant for hypertension, gout, venous stasis ulcers, Parkinson's [name], constipation. He's not been a diabetic but his last hemoglobin A1c was 6 in March 2015. There is documentation that he's had endovenous ablation of bilateral varicose veins but we have no documentation about this and this may be done at least 4-5 years ago. 01/22/2015 -- he has his vascular test is scheduled for this afternoon. 01/29/2015 -- the patient's vascular test was canceled last week because he was unable to get onto the examining bed at [facility]. His Unna's boot was not applied either and the patient had a dressing placed by home health. Today when his dressing was removed we found a lot of maggots in his right lower extremity. 02/07/2015 -- Was seen in the vascular office by the PA and she noted that a bilateral venous duplex study did not show any DVT, SVT or reflux bilaterally. The patient was advised to continue with Unna's boot and would at some stage to require graduated compression stockings of the 20-30 mmHg variety. In the future lymphedema pumps would also be considered. 02/14/2015 -- his dressing is smelling quite a bit and there is a discoloration of the wound suggestive of an infection. Deep tissue cultures will  be taken today. 02/21/2015 --  the culture is back and it has growth moderate growth of Proteus and gram-negative rods sensitive to sulfa, ampicillin, cephazolin, Zosyn. He is already on doxycycline and his wound is looking clean and hence we will continue with this. 02/28/2015 -- he's been doing fine still on his antibiotics and has no fresh issues. 03/07/2015 -- I was told that because he lives in a nursing home the Apligraf was denied by his insurance company. 03/14/2015 -- we are awaiting samples and a trial of Puraply to be tried for his ulceration. 03/21/2015 -- his Apligraf was approved and he is here for his first application of Apligraf 03/28/2015 -- his wound has had quite a bit of secretion and needs to be changed and hence I will use the ASHFORD, CLOUSE. ([id number]) second application of Apligraf. 04/04/2015 -- he is here for a wound check but has a lot of secretions and hence his dressing needs to be taken down. 04/09/2015 -- he is here for his third application of Apligraf 04/18/2015 -- he is here for a wound check and though he does not have any overt infection he always has a foul odor to his leg. 04/25/2015 -- his wound has been doing much better and he has minimal drainage and the size is smaller. Of note he is on doxycycline. 05/02/2015 -- he is here for his fourth application of Apligraf 05/09/2015 -- he is here for a wound check and is bothered by a lot of odor from the wound. 05/16/2015 -- he has done very well, the odor and drainage is less with the wound dressing having been changed twice this week. He is here for his last application of Apligraf. 06/20/2015 -- an area superior to the previous wound has now opened up and this is a fairly superficial ulceration. Electronic Signature(s) Signed: 06/20/2015 3:25:31 PM By: Evlyn Kanner MD, FACS Entered By: Evlyn Kanner on 06/20/2015 15:25:31 [name], [name]  ([id number]) -------------------------------------------------------------------------------- Physical Exam Details Patient Name: [name], [name] Date of Service: 06/20/2015 2:15 PM Medical Record Number: [id number] Patient Account Number: [id number] Date of Birth/Sex: Jul 03, 1936 (79 y.o. Male) Treating RN: Clover Mealy, RN, BSN, [place] Sink Primary Care Physician: Terance Hart, DAVID Other Clinician: Referring Physician: Terance Hart, DAVID Treating Physician/Extender: Rudene Re in Treatment: 22 Constitutional . Pulse regular. Respirations normal and unlabored. Afebrile. . Eyes Nonicteric. Reactive to light. Ears, Nose, Mouth, and Throat Lips, teeth, and gums WNL.Marland Kitchen Moist mucosa without lesions . Neck supple and nontender. No palpable supraclavicular or cervical adenopathy. Normal sized without goiter. Respiratory WNL. No retractions.. Cardiovascular Pedal Pulses WNL. No clubbing, cyanosis or edema. Lymphatic No adneopathy. No adenopathy. No adenopathy. Musculoskeletal Adexa without tenderness or enlargement.. Digits and nails w/o clubbing, cyanosis, infection, petechiae, ischemia, or inflammatory conditions.. Integumentary (Hair, Skin) No suspicious lesions. No crepitus or fluctuance. No peri-wound warmth or erythema. No masses.Marland Kitchen Psychiatric Judgement and insight Intact.. No evidence of depression, anxiety, or agitation.. Notes a small superficial ulceration as there is superior to the previous main wound. The main wound has significant superficial debris which has been washed out with moist saline. Electronic Signature(s) Signed: 06/20/2015 3:26:21 PM By: Evlyn Kanner MD, FACS Entered By: Evlyn Kanner on 06/20/2015 15:26:20 [name], [name] ([id number]) -------------------------------------------------------------------------------- Physician Orders Details Patient Name: [name], [name] Date of Service: 06/20/2015 2:15 PM Medical Record Number: [id number] Patient Account  Number: [id number] Date of Birth/Sex: February 12, 1936 (79 y.o. Male) Treating RN: Clover Mealy, RN, BSN, [place] Sink Primary Care Physician: Dorothey Baseman Other Clinician: Referring Physician:  Terance Hart, DAVID Treating Physician/Extender: Rudene Re in Treatment: 45 Verbal / Phone Orders: Yes Clinician: Afful, RN, BSN, Rita Read Back and Verified: Yes Diagnosis Coding Wound Cleansing Wound #5 Right,Medial Lower Leg o Cleanse wound with mild soap and water o May Shower, gently pat wound dry prior to applying new dressing. Wound #6 Right,Proximal Lower Leg o Cleanse wound with mild soap and water o May Shower, gently pat wound dry prior to applying new dressing. Skin Barriers/Peri-Wound Care Wound #5 Right,Medial Lower Leg o Moisturizing lotion Wound #6 Right,Proximal Lower Leg o Moisturizing lotion Primary Wound Dressing Wound #5 Right,Medial Lower Leg o Promogran o Other: - sorbact Wound #6 Right,Proximal Lower Leg o Promogran o Other: - sorbact Secondary Dressing Wound #5 Right,Medial Lower Leg o Gauze, ABD and Kerlix/Conform Wound #6 Right,Proximal Lower Leg o Gauze, ABD and Kerlix/Conform Dressing Change Frequency Wound #5 Right,Medial Lower Leg o Change dressing every week [name], [name] ([id number]) Wound #6 Right,Proximal Lower Leg o Change dressing every week Follow-up Appointments Wound #5 Right,Medial Lower Leg o Return Appointment in 1 week. Wound #6 Right,Proximal Lower Leg o Return Appointment in 1 week. Edema Control Wound #5 Right,Medial Lower Leg o 4-Layer Compression System - Right Lower Extremity Wound #6 Right,Proximal Lower Leg o 4-Layer Compression System - Right Lower Extremity Electronic Signature(s) Signed: 06/20/2015 4:24:56 PM By: Evlyn Kanner MD, FACS Signed: 06/20/2015 5:19:40 PM By: Elpidio Eric BSN, RN Entered By: Elpidio Eric on 06/20/2015 14:37:56 Wadie, Rodkey Chrissie Noa  ([id number]) -------------------------------------------------------------------------------- Problem List Details Patient Name: [name], [name] Date of Service: 06/20/2015 2:15 PM Medical Record Number: [id number] Patient Account Number: [id number] Date of Birth/Sex: [birth year] (79 y.o. 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 Date of Service: 06/20/2015 2:15 PM Medical Record Number: [id number] Patient Account Number: [id number] Date of Birth/Sex: 01-Sep-1935 (79 y.o. Male) Treating RN: Clover Mealy, RN, BSN, [place] Sink Primary Care Physician: Terance Hart, DAVID Other Clinician: Referring Physician: Dorothey Baseman Treating Physician/Extender: Rudene Re in Treatment: 22 Subjective Chief Complaint Information obtained from Patient Patient presents for treatment of an open ulcer due to venous insufficiency. The patient has had a open wound on his right medial ankle for at least 3 years and was last seen in the wound clinic in February 2015. He was lost to follow-up after that. History of Present Illness (HPI) The  following HPI elements were documented for the patient's wound: Location: right lower extremity ulceration Quality: Patient reports experiencing a dull pain to affected area(s). Severity: Patient states wound are getting worse. 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 Modifying Factors:  Consults to this date include:has received treatment in the wound center in the past but stopped coming here since February 2015 Associated Signs and Symptoms: Patient reports having difficulty standing for long periods. 79 year old male who is known to have progressive weakness and has been in a nursing home for a while has had chronic lower extremity edema both legs and a large open wound on the right lower extremity which is has at least for about 3-4 years. The patient was seen in the wound clinic before and has been treated until February 2015 when he was lost to follow-up. Past medical history is significant for hypertension, gout, venous stasis ulcers, Parkinson's [name], constipation. He's not been a diabetic but his last hemoglobin A1c was 6 in March 2015. There is documentation that he's had endovenous ablation of bilateral varicose veins but we have no documentation about this and this may be done at least 4-5 years ago. 01/22/2015 -- he has his vascular test is scheduled for this afternoon. 01/29/2015 -- the patient's vascular test was canceled last week because he was unable to get onto the examining bed at [facility]. His Unna's boot was not applied either and the patient had a dressing placed by home health. Today when his dressing was removed we found a lot of maggots in his right lower  extremity. 02/07/2015 -- Was seen in the vascular office by the PA and she noted that a bilateral venous duplex study did not show any DVT, SVT or reflux bilaterally. The patient was advised to continue with Unna's boot and would at some stage to require graduated compression stockings of the 20-30 mmHg variety. In the future lymphedema pumps would also be considered. 02/14/2015 -- his dressing is smelling quite a bit and there is a discoloration of the wound suggestive of an [name], [name] ([id number]) infection. Deep tissue cultures will be taken today. 02/21/2015 -- the culture is back and it has growth moderate growth of Proteus and gram-negative rods sensitive to sulfa, ampicillin, cephazolin, Zosyn. He is already on doxycycline and his wound is looking clean and hence we will continue with this. 02/28/2015 -- he's been doing fine still on his antibiotics and has no fresh issues. 03/07/2015 -- I was told that because he lives in a nursing home the Apligraf was denied by his insurance company. 03/14/2015 -- we are awaiting samples and a trial of Puraply to be tried for his ulceration. 03/21/2015 -- his Apligraf was approved and he is here for his first application of Apligraf 03/28/2015 -- his wound has had quite a bit of secretion and needs to be changed and hence I will use the second application of Apligraf. 04/04/2015 -- he is here for a wound check but has a lot of secretions and hence his dressing needs to be taken down. 04/09/2015 -- he is here for his third application of Apligraf 04/18/2015 -- he is here for a wound check and though he does not have any overt infection he always has a foul odor to his leg. 04/25/2015 -- his wound has been doing much better and he has minimal drainage and the size is smaller. Of note he is on doxycycline. 05/02/2015 -- he is here for his fourth application of Apligraf 05/09/2015 -- he is here for a wound check and is bothered by a lot of odor  from the wound. 05/16/2015 -- he has done very well, the odor and drainage is less with the wound dressing having been changed twice this  week. He is here for his last application of Apligraf. 06/20/2015 -- an area superior to the previous wound has now opened up and this is a fairly superficial ulceration. Objective Constitutional Pulse regular. Respirations normal and unlabored. Afebrile. Vitals Time Taken: 2:23 PM, Height: 68 in, Temperature: 97.5 F, Pulse: 75 bpm, Respiratory Rate: 18 breaths/min, Blood Pressure: 146/61 mmHg. Eyes Nonicteric. Reactive to light. Ears, Nose, Mouth, and Throat Lips, teeth, and gums WNL.Marland Kitchen Moist mucosa without lesions . Neck supple and nontender. No palpable supraclavicular or cervical adenopathy. Normal sized without goiter. [name], [name] ([id number]) Respiratory WNL. No retractions.. Cardiovascular Pedal Pulses WNL. No clubbing, cyanosis or edema. Lymphatic No adneopathy. No adenopathy. No adenopathy. Musculoskeletal Adexa without tenderness or enlargement.. Digits and nails w/o clubbing, cyanosis, infection, petechiae, ischemia, or inflammatory conditions.Marland Kitchen Psychiatric Judgement and insight Intact.. No evidence of depression, anxiety, or agitation.. General Notes: a small superficial ulceration as there is superior to the previous main wound. The main wound has significant superficial debris which has been washed out with moist saline. Integumentary (Hair, Skin) No suspicious lesions. No crepitus or fluctuance. No peri-wound warmth or erythema. No masses.. Wound #5 status is Open. Original cause of wound was Gradually Appeared. The wound is located on the Right,Medial Lower Leg. The wound measures 6.8cm length x 2.5cm width x 0.1cm depth; 13.352cm^2 area and 1.335cm^3 volume. The wound is limited to skin breakdown. There is no tunneling or undermining noted. There is a medium amount of serosanguineous drainage noted. The wound margin is  distinct with the outline attached to the wound base. There is medium (34-66%) pink, pale granulation within the wound bed. There is a medium (34-66%) amount of necrotic tissue within the wound bed including Adherent Slough. The periwound skin appearance exhibited: Localized Edema, Moist. The periwound skin appearance did not exhibit: Callus, Crepitus, Excoriation, Fluctuance, Friable, Induration, Rash, Scarring, Dry/Scaly, Maceration, Atrophie Blanche, Cyanosis, Ecchymosis, Hemosiderin Staining, Mottled, Pallor, Rubor, Erythema. Periwound temperature was noted as No Abnormality. Wound #6 status is Open. Original cause of wound was Gradually Appeared. The wound is located on the Right,Proximal Lower Leg. The wound measures 1cm length x 1cm width x 0.1cm depth; 0.785cm^2 area and 0.079cm^3 volume. The wound is limited to skin breakdown. There is no tunneling or undermining noted. There is a small amount of serosanguineous drainage noted. The wound margin is distinct with the outline attached to the wound base. There is large (67-100%) granulation within the wound bed. There is no necrotic tissue within the wound bed. The periwound skin appearance exhibited: Moist. The periwound skin appearance did not exhibit: Callus, Crepitus, Excoriation, Fluctuance, Friable, Induration, Localized Edema, Rash, Scarring, Dry/Scaly, Maceration, Atrophie Blanche, Cyanosis, Ecchymosis, Hemosiderin Staining, Mottled, Pallor, Rubor, Erythema. Periwound temperature was noted as No Abnormality. 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 They will use a layer of collagen, Sorbact, foam, and then Carboflex. 2. we  will continue with the 4-layer Profore wrap. 3. He will continue with doxycycline 100 mg by mouth twice a day. 4. we will see him back next week. 5. for the area which is more superior we'll apply a small piece of Prisma Plan Wound Cleansing: Wound #5 Right,Medial Lower Leg: Cleanse wound with mild soap and water May Shower, gently pat  wound dry prior to applying new dressing. Wound #6 Right,Proximal Lower Leg: Cleanse wound with mild soap and water May Shower, gently pat wound dry prior to applying new dressing. Skin Barriers/Peri-Wound Care: Wound #5 Right,Medial Lower Leg: Moisturizing lotion Wound #6 Right,Proximal Lower Leg: Moisturizing lotion Primary Wound Dressing: Wound #5 Right,Medial Lower Leg: Promogran Other: - sorbact Wound #6 Right,Proximal Lower Leg: Promogran Other: - sorbact Secondary Dressing: Wound #5 Right,Medial Lower Leg: Gauze, ABD and Kerlix/Conform Wound #6 Right,Proximal Lower Leg: Gauze, ABD and Kerlix/Conform Dressing Change Frequency: Wound #5 Right,Medial Lower Leg: [name], [name] ([id number]) Change dressing every week Wound #6 Right,Proximal Lower Leg: Change dressing every week Follow-up Appointments: Wound #5 Right,Medial Lower Leg: Return Appointment in 1 week. Wound #6 Right,Proximal Lower Leg: Return Appointment in 1 week. Edema Control: Wound #5 Right,Medial Lower Leg: 4-Layer Compression System - Right Lower Extremity Wound #6 Right,Proximal Lower Leg: 4-Layer Compression System - Right Lower Extremity I have recommended: 1. They will use a layer of collagen, Sorbact, foam, and then Carboflex. 2. we will continue with the 4-layer Profore wrap. 3. He will continue with doxycycline 100 mg by mouth twice a day. 4. we will see him back next week. 5. for the area which is more superior we'll apply a small piece of Prisma Electronic Signature(s) Signed: 06/20/2015 3:27:31 PM By: Evlyn Kanner MD, FACS Entered By: Evlyn Kanner  on 06/20/2015 15:27:31 Ruhan, Borak Chrissie Noa ([id number]) -------------------------------------------------------------------------------- SuperBill Details Patient Name: [name] Date of Service: 06/20/2015 Medical Record Number: [id number] Patient Account Number: [id number] Date of Birth/Sex: 03-20-1936 (79 y.o. 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 E66.01 Morbid (severe) obesity due to excess calories I89.0 Lymphedema, not elsewhere classified Facility Procedures CPT4: Description Modifier Quantity Code [id number] (Facility Use Only) [phone] - APPLY MULTLAY COMPRS LWR RT 1 LEG Physician Procedures CPT4 Code Description: [id number] - WC PHYS LEVEL 3 - EST PT ICD-10 Description Diagnosis I87.311 Chronic venous hypertension (idiopathic) with ulcer of 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 E66.01 Morbid (severe) obesity due  to excess calories I89.0 Lymphedema, not elsewhere classified Modifier: right lower s of muscle Quantity: 1 extremity Electronic Signature(s) Signed: 06/20/2015 3:27:51 PM By: Evlyn Kanner MD, FACS Entered By: Evlyn Kanner on 06/20/2015 15:27:51

## 2015-06-21 NOTE — Progress Notes (Addendum)
[name] ([id number]) Visit Report for 06/20/2015 Arrival Information Details Patient Name: [name], [name] Date of Service: 06/20/2015 2:15 PM Medical Record Number: [id number] Patient Account Number: [id number] Date of Birth/Sex: 03-Nov-1935 (79 y.o. Male) Treating RN: [name] Mealy, RN, BSN, [name] Primary Care Physician: [name], [name] Other Clinician: Referring Physician: Terance [name], [name] Treating Physician/Extender: [name] in Treatment: 22 Visit Information History Since Last Visit Any new allergies or adverse reactions: [name] Patient Arrived: Wheel Chair Had a fall or experienced change in [name] activities of daily living that may affect Arrival Time: 14:16 risk of falls: Accompanied By: self Signs or symptoms of abuse/neglect since last [name] Transfer Assistance: None visito Patient Identification Verified: Yes Has Dressing in Place as Prescribed: Yes Secondary Verification Process Yes Has Compression in Place as Prescribed: Yes Completed: Pain Present Now: [name] Patient Requires Transmission-Based [name] Precautions: Patient Has Alerts: [name] Electronic Signature(s) Signed: 06/20/2015 2:17:04 PM By: [name] BSN, RN Entered By: [name] on 06/20/2015 14:17:04 [name] ([id number]) -------------------------------------------------------------------------------- Encounter Discharge Information Details Patient Name: [name] Date of Service: 06/20/2015 2:15 PM Medical Record Number: [id number] Patient Account Number: [id number] Date of Birth/Sex: 06/11/1936 (79 y.o. Male) Treating RN: [name] Mealy, RN, BSN, [name] Primary Care Physician: [name], [name] Other Clinician: Referring Physician: Terance [name], [name] Treating Physician/Extender: [name] in Treatment: 22 Encounter Discharge Information Items Discharge Pain Level: 0 Discharge Condition: Stable Ambulatory Status: Wheelchair Discharge Destination: Nursing Home Transportation:  Other Accompanied By: self Schedule Follow-up Appointment: [name] Medication Reconciliation completed and provided to Patient/Care [name] Khaleelah Yowell: Provided on Clinical Summary of Care: 06/20/2015 Form Type Recipient Paper Patient [facility] Electronic Signature(s) Signed: 06/20/2015 5:19:40 PM By: [name] BSN, RN Previous Signature: 06/20/2015 2:52:04 PM Version By: [name] Entered By: [name] on 06/20/2015 14:52:53 [name], Parkerson [name] ([id number]) -------------------------------------------------------------------------------- Lower Extremity Assessment Details Patient Name: [name] Date of Service: 06/20/2015 2:15 PM Medical Record Number: [id number] Patient Account Number: [id number] Date of Birth/Sex: Jun 09, 1936 (79 y.o. Male) Treating RN: [name] Mealy, RN, BSN, [place] [name] Primary Care Physician: [name], [name] Other Clinician: Referring Physician: Terance [name], [name] Treating Physician/Extender: [name] in Treatment: 22 Edema Assessment Assessed: [Left: [name]] [Right: [name]] E[Left: dema] [Right: :] Calf Left: Right: Point of Measurement: 40 cm From Medial Instep cm 37.8 cm Ankle Left: Right: Point of Measurement: 8 cm From Medial Instep cm 27.9 cm Vascular Assessment Pulses: Posterior Tibial Dorsalis Pedis Palpable: [Right:Yes] Extremity colors, hair growth, and conditions: Extremity Color: [Right:Hyperpigmented] Hair Growth on Extremity: [Right:[name]] Temperature of Extremity: [Right:Warm] Capillary Refill: [Right:< 3 seconds] Toe Nail Assessment Left: Right: Thick: Yes Discolored: Yes Deformed: Yes Improper Length and Hygiene: Yes Electronic Signature(s) Signed: 06/20/2015 5:19:40 PM By: [name] BSN, RN Entered By: [name] on 06/20/2015 14:22:49 [name] ([id number]) -------------------------------------------------------------------------------- Multi Wound Chart Details Patient Name: [name] Date of Service: 06/20/2015 2:15  PM Medical Record Number: [id number] Patient Account Number: [id number] Date of Birth/Sex: 09-14-1935 (79 y.o. Male) Treating RN: [name] Mealy, RN, BSN, [place] [name] Primary Care Physician: [name], [name] Other Clinician: Referring Physician: Terance [name], [name] Treating Physician/Extender: [name] in Treatment: 22 Vital Signs Height(in): 68 Pulse(bpm): 75 Weight(lbs): Blood Pressure 146/61 (mmHg): Body Mass Index(BMI): Temperature(F): 97.5 Respiratory Rate 18 (breaths/min): Photos: [5:[name] Photos] [N/A:N/A] Wound Location: [5:Right Lower Leg - Medial] [N/A:N/A] Wounding Event: [5:Gradually Appeared] [N/A:N/A] Primary Etiology: [5:Venous Leg Ulcer] [N/A:N/A] Comorbid History: [5:Cataracts, Anemia, Hypertension, Osteoarthritis] [N/A:N/A] Date Acquired: [5:11/13/2014] [N/A:N/A] Weeks of Treatment: [5:22] [N/A:N/A] Wound Status: [5:Open] [N/A:N/A] Measurements L x W x  D 6.8x2.5x0.1 [N/A:N/A] (cm) Area (cm) : [5:13.352] [N/A:N/A] Volume (cm) : [5:1.335] [N/A:N/A] % Reduction in Area: [5:87.00%] [N/A:N/A] % Reduction in Volume: 95.70% [N/A:N/A] Classification: [5:Full Thickness Without Exposed Support Structures] [N/A:N/A] Exudate Amount: [5:Medium] [N/A:N/A] Exudate Type: [5:Serosanguineous] [N/A:N/A] Exudate Color: [5:red, brown] [N/A:N/A] Foul Odor After [5:Yes] [N/A:N/A] Cleansing: Odor Anticipated Due to [name] [N/A:N/A] Product Use: Wound Margin: [5:Distinct, outline attached] [N/A:N/A] Granulation Amount: [5:Medium (34-66%)] [N/A:N/A] Granulation Quality: [5:Pink, Pale] [N/A:N/A] Necrotic Amount: [5:Medium (34-66%)] [N/A:N/A] Exposed Structures: Fascia: [name] N/A N/A Fat: [name] Tendon: [name] Muscle: [name] Joint: [name] Bone: [name] Limited to Skin Breakdown Epithelialization: Medium (34-66%) N/A N/A Periwound Skin Texture: Edema: Yes N/A N/A Excoriation: [name] Induration: [name] Callus: [name] Crepitus: [name] Fluctuance: [name] Friable: [name] Rash: [name] Scarring: [name] Periwound Skin Moist: Yes N/A  N/A Moisture: Maceration: [name] Dry/Scaly: [name] Periwound Skin Color: Atrophie Blanche: [name] N/A N/A Cyanosis: [name] Ecchymosis: [name] Erythema: [name] Hemosiderin Staining: [name] Mottled: [name] Pallor: [name] Rubor: [name] Temperature: [name] Abnormality N/A N/A Tenderness on [name] N/A N/A Palpation: Wound Preparation: Ulcer Cleansing: N/A N/A Rinsed/Irrigated with Saline Topical Anesthetic Applied: Other: lidocaine 4% Treatment Notes Electronic Signature(s) Signed: 06/20/2015 5:19:40 PM By: [name] BSN, RN Entered By: [name] on 06/20/2015 14:33:15 [name], [name] ([id number]) -------------------------------------------------------------------------------- Multi-Disciplinary Care Plan Details Patient Name: [name], [name] Date of Service: 06/20/2015 2:15 PM Medical Record Number: [id number] Patient Account Number: [id number] Date of Birth/Sex: Jul 07, 1935 (79 y.o. Male) Treating RN: [name] Mealy, RN, BSN, [place] [name] Primary Care Physician: [name], [name] Other Clinician: Referring Physician: Terance [name], [name] Treating Physician/Extender: [name] in Treatment: 22 Active Inactive Orientation to the Wound Care Program Nursing Diagnoses: Knowledge deficit related to the wound healing center program Goals: Patient/caregiver will verbalize understanding of the Wound Healing Center Program Date Initiated: 01/15/2015 Goal Status: Active Interventions: Provide education on orientation to the wound center Notes: Venous Leg Ulcer Nursing Diagnoses: Knowledge deficit related to disease process and management Potential for venous Insuffiency (use before diagnosis confirmed) Goals: Patient will maintain optimal edema control Date Initiated: 01/15/2015 Goal Status: Active Patient/caregiver will verbalize understanding of disease process and disease management Date Initiated: 01/15/2015 Goal Status: Active Verify adequate tissue perfusion prior to therapeutic compression application Date Initiated:  01/15/2015 Goal Status: Active Interventions: Assess peripheral edema status every visit. Compression as ordered Provide education on venous insufficiency [name], [name] ([id number]) Treatment Activities: Test ordered outside of clinic : 06/20/2015 Therapeutic compression applied : 06/20/2015 Venous Duplex Doppler : 06/20/2015 Notes: Wound/Skin Impairment Nursing Diagnoses: Impaired tissue integrity Knowledge deficit related to ulceration/compromised skin integrity Goals: Patient/caregiver will verbalize understanding of skin care regimen Date Initiated: 01/15/2015 Goal Status: Active Ulcer/skin breakdown will have a volume reduction of 30% by week 4 Date Initiated: 01/15/2015 Goal Status: Active Ulcer/skin breakdown will have a volume reduction of 50% by week 8 Date Initiated: 01/15/2015 Goal Status: Active Ulcer/skin breakdown will have a volume reduction of 80% by week 12 Date Initiated: 01/15/2015 Goal Status: Active Ulcer/skin breakdown will heal within 14 weeks Date Initiated: 01/15/2015 Goal Status: Active Interventions: Assess patient/caregiver ability to perform ulcer/skin care regimen upon admission and as needed Assess ulceration(s) every visit Provide education on ulcer and skin care Notes: Electronic Signature(s) Signed: 06/20/2015 5:19:40 PM By: [name] BSN, RN Entered By: [name] on 06/20/2015 14:33:00 [name] ([id number]) -------------------------------------------------------------------------------- Pain Assessment Details Patient Name: [name] Date of Service: 06/20/2015 2:15 PM Medical Record Number: [id number] Patient Account Number: [id number] Date of Birth/Sex: 11/22/1935 (79 y.o. Male) Treating RN: [name] Mealy, RN,  BSN, [place] [name] Primary Care Physician: [name], [name] Other Clinician: Referring Physician: Dorothey Baseman Treating Physician/Extender: [name] in Treatment: 22 Active Problems Location of Pain  Severity and Description of Pain Patient Has Paino [name] Site Locations Pain Management and Medication Current Pain Management: Electronic Signature(s) Signed: 06/20/2015 2:17:13 PM By: [name] BSN, RN Entered By: [name] on 06/20/2015 14:17:13 [name] ([id number]) -------------------------------------------------------------------------------- Patient/Caregiver Education Details Patient Name: [name] Date of Service: 06/20/2015 2:15 PM Medical Record Number: [id number] Patient Account Number: [id number] Date of Birth/Gender: June 09, 1936 (79 y.o. Male) Treating RN: [name] Mealy, RN, BSN, [place] [name] Primary Care Physician: [name], [name] Other Clinician: Referring Physician: Terance [name] [name] Treating Physician/Extender: [name] in Treatment: 22 Education Assessment Education Provided To: Patient Education Topics Provided Venous: Methods: Explain/Verbal Responses: State content correctly Welcome To The Wound Care Center: Methods: Explain/Verbal Responses: State content correctly Wound/Skin Impairment: Methods: Explain/Verbal Responses: State content correctly Electronic Signature(s) Signed: 06/20/2015 5:19:40 PM By: [name] BSN, RN Entered By: [name] on 06/20/2015 14:53:08 [name] ([id number]) -------------------------------------------------------------------------------- Wound Assessment Details Patient Name: [name] Date of Service: 06/20/2015 2:15 PM Medical Record Number: [id number] Patient Account Number: [id number] Date of Birth/Sex: Jan 31, 1936 (79 y.o. Male) Treating RN: [name] Mealy, RN, BSN, [place] Primary Care Physician: [name], [name] Other Clinician: Referring Physician: Terance [name], [name] Treating Physician/Extender: [name] in Treatment: 22 Wound Status Wound Number: 5 Primary Venous Leg Ulcer Etiology: Wound Location: Right Lower Leg - Medial Wound Status: Open Wounding Event: Gradually  Appeared Comorbid Cataracts, Anemia, Hypertension, Date Acquired: 11/13/2014 History: Osteoarthritis Weeks Of Treatment: 22 Clustered Wound: [name] Photos Photo Uploaded By: [name] on 06/20/2015 16:37:38 Wound Measurements Length: (cm) 6.8 Width: (cm) 2.5 Depth: (cm) 0.1 Area: (cm) 13.352 Volume: (cm) 1.335 % Reduction in Area: 87% % Reduction in Volume: 95.7% Epithelialization: Medium (34-66%) Tunneling: [name] Undermining: [name] Wound Description Full Thickness Without Exposed Classification: Support Structures Wound Margin: Distinct, outline attached Exudate Medium Amount: [name], [name] ([id number]) Foul Odor After Cleansing: Yes Due to Product Use: [name] Exudate Type: Serosanguineous Exudate Color: red, brown Wound Bed Granulation Amount: Medium (34-66%) Exposed Structure Granulation Quality: Pink, Pale Fascia Exposed: [name] Necrotic Amount: Medium (34-66%) Fat Layer Exposed: [name] Necrotic Quality: Adherent Slough Tendon Exposed: [name] Muscle Exposed: [name] Joint Exposed: [name] Bone Exposed: [name] Limited to Skin Breakdown Periwound Skin Texture Texture Color [name] Abnormalities Noted: [name] [name] Abnormalities Noted: [name] Callus: [name] Atrophie Blanche: [name] Crepitus: [name] Cyanosis: [name] Excoriation: [name] Ecchymosis: [name] Fluctuance: [name] Erythema: [name] Friable: [name] Hemosiderin Staining: [name] Induration: [name] Mottled: [name] Localized Edema: Yes Pallor: [name] Rash: [name] Rubor: [name] Scarring: [name] Temperature / Pain Moisture Temperature: [name] Abnormality [name] Abnormalities Noted: [name] Dry / Scaly: [name] Maceration: [name] Moist: Yes Wound Preparation Ulcer Cleansing: Rinsed/Irrigated with Saline Topical Anesthetic Applied: Other: lidocaine 4%, Treatment Notes Wound #5 (Right, Medial Lower Leg) 1. Cleansed with: Cleanse wound with antibacterial soap and water 3. Peri-wound Care: Moisturizing lotion 4. Dressing Applied: Promogran Other dressing (specify in notes) 5. Secondary Dressing Applied ABD  Pad [name], [name] ([id number]) 7. Secured with 4-Layer Compression System - Right Lower Extremity Notes sorbact, drawtex, carboflex Electronic Signature(s) Signed: 06/20/2015 5:19:40 PM By: [name] BSN, RN Entered By: [name] on 06/20/2015 14:31:45 [name], [name] ([id number]) -------------------------------------------------------------------------------- Wound Assessment Details Patient Name: [name], [name] Date of Service: 06/20/2015 2:15 PM Medical Record Number: [id number] Patient Account Number: [id number] Date of Birth/Sex: Dec 30, 1935 (79 y.o. Male) Treating RN: [name] Mealy, RN, BSN, [facility] Primary Care Physician:  [name], [name] Other Clinician: Referring Physician: Terance [name], [name] Treating Physician/Extender: [name] in Treatment: 22 Wound Status Wound Number: 6 Primary Skin Tear Etiology: Wound Location: Right Lower Leg - Proximal Wound Status: Open Wounding Event: Gradually Appeared Comorbid Cataracts, Anemia, Hypertension, Date Acquired: 06/20/2015 History: Osteoarthritis Weeks Of Treatment: 0 Clustered Wound: [name] Photos Photo Uploaded By: [name] on 06/20/2015 16:37:38 Wound Measurements Length: (cm) 1 Width: (cm) 1 Depth: (cm) 0.1 Area: (cm) 0.785 Volume: (cm) 0.079 % Reduction in Area: % Reduction in Volume: Epithelialization: Small (1-33%) Tunneling: [name] Undermining: [name] Wound Description Classification: Partial Thickness Foul Odor Aft Wound Margin: Distinct, outline attached Exudate Amount: Small Exudate Type: Serosanguineous Exudate Color: red, brown [name], [name] ([id number]) er Cleansing: [name] Wound Bed Granulation Amount: Large (67-100%) Exposed Structure Necrotic Amount: None Present (0%) Fascia Exposed: [name] Fat Layer Exposed: [name] Tendon Exposed: [name] Muscle Exposed: [name] Joint Exposed: [name] Bone Exposed: [name] Limited to Skin Breakdown Periwound Skin Texture Texture Color [name] Abnormalities Noted: [name] [name]  Abnormalities Noted: [name] Callus: [name] Atrophie Blanche: [name] Crepitus: [name] Cyanosis: [name] Excoriation: [name] Ecchymosis: [name] Fluctuance: [name] Erythema: [name] Friable: [name] Hemosiderin Staining: [name] Induration: [name] Mottled: [name] Localized Edema: [name] Pallor: [name] Rash: [name] Rubor: [name] Scarring: [name] Temperature / Pain Moisture Temperature: [name] Abnormality [name] Abnormalities Noted: [name] Dry / Scaly: [name] Maceration: [name] Moist: Yes Wound Preparation Ulcer Cleansing: Other: soap and water, Topical Anesthetic Applied: None Treatment Notes Wound #6 (Right, Proximal Lower Leg) 1. Cleansed with: Cleanse wound with antibacterial soap and water 3. Peri-wound Care: Moisturizing lotion 4. Dressing Applied: Promogran Other dressing (specify in notes) 5. Secondary Dressing Applied ABD Pad 7. Secured with 4-Layer Compression System - Right Lower Extremity [name], [name] ([id number]) Notes sorbact, drawtex, carboflex Electronic Signature(s) Signed: 06/20/2015 5:19:40 PM By: [name] BSN, RN Entered By: [name] on 06/20/2015 14:36:42 [name], [name] [name] ([id number]) -------------------------------------------------------------------------------- Vitals Details Patient Name: [name] Date of Service: 06/20/2015 2:15 PM Medical Record Number: [id number] Patient Account Number: [id number] Date of Birth/Sex: 07/31/1935 (79 y.o. Male) Treating RN: [name] Mealy, RN, BSN, [place] Primary Care Physician: [name], [name] Other Clinician: Referring Physician: Terance [name], [name] Treating Physician/Extender: [name] in Treatment: 22 Vital Signs Time Taken: 14:23 Temperature (F): 97.5 Height (in): 68 Pulse (bpm): 75 Respiratory Rate (breaths/min): 18 Blood Pressure (mmHg): 146/61 Reference Range: 80 - 120 mg / dl Electronic Signature(s) Signed: 06/20/2015 5:19:40 PM By: [name] BSN, RN Entered By: [name] on 06/20/2015 14:23:19

## 2015-06-27 ENCOUNTER — Encounter: Payer: Medicare Other | Admitting: Surgery

## 2015-06-27 DIAGNOSIS — I87311 Chronic venous hypertension (idiopathic) with ulcer of right lower extremity: Secondary | ICD-10-CM | POA: Diagnosis not present

## 2015-06-28 NOTE — Progress Notes (Addendum)
MEHKAI, GALLO ([id number]) Visit Report for 06/27/2015 Arrival Information Details Patient Name: [name], [name] Date of Service: 06/27/2015 1:30 PM Medical Record Number: [id number] Patient Account Number: [id number] Date of Birth/Sex: 02/05/1936 (79 y.o. Male) Treating RN: Curtis Sites Primary Care Physician: Dorothey Baseman Other Clinician: Referring Physician: Terance Hart, DAVID Treating Physician/Extender: Rudene Re in Treatment: 23 Visit Information History Since Last Visit Added or deleted any medications: No Patient Arrived: Wheel Chair Any new allergies or adverse reactions: No Arrival Time: 13:50 Had a fall or experienced change in No activities of [name] living that may affect Accompanied By: self risk of falls: Transfer Assistance: None Signs or symptoms of abuse/neglect since last No Patient Identification Verified: Yes visito Secondary Verification Process Yes Hospitalized since last visit: No Completed: Pain Present Now: No Patient Requires Transmission-Based No Precautions: Patient Has Alerts: No Electronic Signature(s) Signed: 06/27/2015 5:45:44 PM By: Curtis Sites Entered By: Curtis Sites on 06/27/2015 13:54:04 [name] ([id number]) -------------------------------------------------------------------------------- Complex / Palliative Patient Assessment Details Patient Name: [name] Date of Service: 06/27/2015 1:30 PM Medical Record Number: [id number] Patient Account Number: [id number] Date of Birth/Sex: 1936-02-04 (79 y.o. Male) Treating RN: Huel Coventry Primary Care Physician: Dorothey Baseman Other Clinician: Referring Physician: Dorothey Baseman Treating Physician/Extender: Rudene Re in Treatment: 23 Palliative Management Criteria Complex Wound Management Criteria Patient has remarkable or complex co-morbidities requiring medications or treatments that extend wound healing times. Examples: o  Diabetes mellitus with chronic renal failure or end stage renal disease requiring dialysis o Advanced or poorly controlled rheumatoid arthritis o Diabetes mellitus and end stage chronic obstructive pulmonary disease o Active cancer with current chemo- or radiation therapy Chronic Lymphedema Care Approach Wound Care Plan: Complex Wound Management Electronic Signature(s) Signed: 07/12/2015 5:07:23 PM By: Evlyn Kanner MD, FACS Signed: 07/12/2015 5:51:31 PM By: Elliot Gurney, RN, BSN, Kim RN, BSN Entered By: Elliot Gurney, RN, BSN, Kim on 06/27/2015 18:43:53 Jarl, Sellitto Chrissie Noa ([id number]) -------------------------------------------------------------------------------- Encounter Discharge Information Details Patient Name: [name], [name] Date of Service: 06/27/2015 1:30 PM Medical Record Number: [id number] Patient Account Number: [id number] Date of Birth/Sex: Dec 14, 1935 (80 y.o. Male) Treating RN: Curtis Sites Primary Care Physician: Dorothey Baseman Other Clinician: Referring Physician: Dorothey Baseman Treating Physician/Extender: Rudene Re in Treatment: 79 Encounter Discharge Information Items Discharge Pain Level: 0 Discharge Condition: Stable Ambulatory Status: Wheelchair Discharge Destination: Nursing Home Transportation: Private Auto Accompanied By: self Schedule Follow-up Appointment: Yes Medication Reconciliation completed and provided to Patient/Care No Margarethe Virgen: Provided on Clinical Summary of Care: 06/27/2015 Form Type Recipient Paper Patient [facility] Electronic Signature(s) Signed: 06/27/2015 5:13:01 PM By: Curtis Sites Previous Signature: 06/27/2015 2:31:25 PM Version By: Gwenlyn Perking Entered By: Curtis Sites on 06/27/2015 17:13:01 Wesson, Stith Chrissie Noa ([id number]) -------------------------------------------------------------------------------- Lower Extremity Assessment Details Patient Name: [name] Date of Service: 06/27/2015 1:30 PM Medical  Record Number: [id number] Patient Account Number: [id number] Date of Birth/Sex: Dec 30, 1935 (79 y.o. Male) Treating RN: Curtis Sites Primary Care Physician: Terance Hart, DAVID Other Clinician: Referring Physician: Terance Hart, DAVID Treating Physician/Extender: Rudene Re in Treatment: 23 Edema Assessment Assessed: [Left: No] [Right: No] Edema: [Left: Ye] [Right: s] Calf Left: Right: Point of Measurement: 40 cm From Medial Instep cm 39.3 cm Ankle Left: Right: Point of Measurement: 8 cm From Medial Instep cm 28.9 cm Vascular Assessment Pulses: Posterior Tibial Dorsalis Pedis Palpable: [Right:Yes] Extremity colors, hair growth, and conditions: Extremity Color: [Right:Hyperpigmented] Hair Growth on Extremity: [Right:No] Temperature of Extremity: [Right:Warm] Capillary Refill: [Right:< 3 seconds] Electronic Signature(s) Signed: 06/27/2015 5:45:44 PM By: Curtis Sites  Entered By: Curtis Sites on 06/27/2015 14:09:09 [name] ([id number]) -------------------------------------------------------------------------------- Multi Wound Chart Details Patient Name: [name], [name] Date of Service: 06/27/2015 1:30 PM Medical Record Number: [id number] Patient Account Number: [id number] Date of Birth/Sex: 09-Dec-1935 (79 y.o. Male) Treating RN: Curtis Sites Primary Care Physician: Dorothey Baseman Other Clinician: Referring Physician: Terance Hart, DAVID Treating Physician/Extender: Rudene Re in Treatment: 23 Vital Signs Height(in): 68 Pulse(bpm): 72 Weight(lbs): Blood Pressure 160/74 (mmHg): Body Mass Index(BMI): Temperature(F): 97.9 Respiratory Rate 20 (breaths/min): Photos: [5:No Photos] [6:No Photos] [N/A:N/A] Wound Location: [5:Right Lower Leg - Medial] [6:Right, Proximal Lower Leg N/A] Wounding Event: [5:Gradually Appeared] [6:Gradually Appeared] [N/A:N/A] Primary Etiology: [5:Venous Leg Ulcer] [6:Skin Tear] [N/A:N/A] Comorbid History:  [5:Cataracts, Anemia, Hypertension, Osteoarthritis] [6:N/A] [N/A:N/A] Date Acquired: [5:11/13/2014] [6:06/20/2015] [N/A:N/A] Weeks of Treatment: [5:23] [6:1] [N/A:N/A] Wound Status: [5:Open] [6:Healed - Epithelialized] [N/A:N/A] Measurements L x W x D 4.5x2.7x0.1 [6:0x0x0] [N/A:N/A] (cm) Area (cm) : [5:9.543] [6:0] [N/A:N/A] Volume (cm) : [5:0.954] [6:0] [N/A:N/A] % Reduction in Area: [5:90.70%] [6:100.00%] [N/A:N/A] % Reduction in Volume: 96.90% [6:100.00%] [N/A:N/A] Classification: [5:Full Thickness Without Exposed Support Structures] [6:Partial Thickness] [N/A:N/A] Exudate Amount: [5:Medium] [6:N/A] [N/A:N/A] Exudate Type: [5:Serosanguineous] [6:N/A] [N/A:N/A] Exudate Color: [5:red, brown] [6:N/A] [N/A:N/A] Foul Odor After [5:Yes] [6:N/A] [N/A:N/A] Cleansing: Odor Anticipated Due to No [6:N/A] [N/A:N/A] Product Use: Wound Margin: [5:Distinct, outline attached] [6:N/A] [N/A:N/A] Granulation Amount: [5:Large (67-100%)] [6:N/A] [N/A:N/A] Granulation Quality: [5:Pink, Pale] [6:N/A] [N/A:N/A] Necrotic Amount: [5:None Present (0%)] [6:N/A] [N/A:N/A] Exposed Structures: Fascia: No N/A N/A Fat: No Tendon: No Muscle: No Joint: No Bone: No Limited to Skin Breakdown Epithelialization: Medium (34-66%) N/A N/A Periwound Skin Texture: Edema: Yes No Abnormalities Noted N/A Excoriation: No Induration: No Callus: No Crepitus: No Fluctuance: No Friable: No Rash: No Scarring: No Periwound Skin Moist: Yes No Abnormalities Noted N/A Moisture: Maceration: No Dry/Scaly: No Periwound Skin Color: Atrophie Blanche: No No Abnormalities Noted N/A Cyanosis: No Ecchymosis: No Erythema: No Hemosiderin Staining: No Mottled: No Pallor: No Rubor: No Temperature: No Abnormality N/A N/A Tenderness on No No N/A Palpation: Wound Preparation: Ulcer Cleansing: Other: N/A N/A soap and water Topical Anesthetic Applied: Other: lidocaine 4% Treatment Notes Electronic Signature(s) Signed:  06/27/2015 5:45:44 PM By: Curtis Sites Entered By: Curtis Sites on 06/27/2015 14:13:45 Kim, Bolon Chrissie Noa ([id number]) -------------------------------------------------------------------------------- Multi-Disciplinary Care Plan Details Patient Name: [name], [name] Date of Service: 06/27/2015 1:30 PM Medical Record Number: [id number] Patient Account Number: [id number] Date of Birth/Sex: Aug 25, 1935 (79 y.o. Male) Treating RN: Curtis Sites Primary Care Physician: Dorothey Baseman Other Clinician: Referring Physician: Dorothey Baseman Treating Physician/Extender: Rudene Re in Treatment: 45 Active Inactive Orientation to the Wound Care Program Nursing Diagnoses: Knowledge deficit related to the wound healing center program Goals: Patient/caregiver will verbalize understanding of the Wound Healing Center Program Date Initiated: 01/15/2015 Goal Status: Active Interventions: Provide education on orientation to the wound center Notes: Venous Leg Ulcer Nursing Diagnoses: Knowledge deficit related to disease process and management Potential for venous Insuffiency (use before diagnosis confirmed) Goals: Patient will maintain optimal edema control Date Initiated: 01/15/2015 Goal Status: Active Patient/caregiver will verbalize understanding of disease process and disease management Date Initiated: 01/15/2015 Goal Status: Active Verify adequate tissue perfusion prior to therapeutic compression application Date Initiated: 01/15/2015 Goal Status: Active Interventions: Assess peripheral edema status every visit. Compression as ordered Provide education on venous insufficiency [name], [name] ([id number]) Treatment Activities: Test ordered outside of clinic : 06/27/2015 Therapeutic compression applied : 06/27/2015 Venous Duplex Doppler : 06/27/2015 Notes: Wound/Skin Impairment Nursing Diagnoses: Impaired tissue integrity Knowledge deficit  related to  ulceration/compromised skin integrity Goals: Patient/caregiver will verbalize understanding of skin care regimen Date Initiated: 01/15/2015 Goal Status: Active Ulcer/skin breakdown will have a volume reduction of 30% by week 4 Date Initiated: 01/15/2015 Goal Status: Active Ulcer/skin breakdown will have a volume reduction of 50% by week 8 Date Initiated: 01/15/2015 Goal Status: Active Ulcer/skin breakdown will have a volume reduction of 80% by week 12 Date Initiated: 01/15/2015 Goal Status: Active Ulcer/skin breakdown will heal within 14 weeks Date Initiated: 01/15/2015 Goal Status: Active Interventions: Assess patient/caregiver ability to perform ulcer/skin care regimen upon admission and as needed Assess ulceration(s) every visit Provide education on ulcer and skin care Notes: Electronic Signature(s) Signed: 06/27/2015 5:45:44 PM By: Curtis Sites Entered By: Curtis Sites on 06/27/2015 14:13:33 [name] ([id number]) -------------------------------------------------------------------------------- Patient/Caregiver Education Details Patient Name: [name] Date of Service: 06/27/2015 1:30 PM Medical Record Number: [id number] Patient Account Number: [id number] Date of Birth/Gender: Jul 17, 1935 (79 y.o. Male) Treating RN: Curtis Sites Primary Care Physician: Dorothey Baseman Other Clinician: Referring Physician: Dorothey Baseman Treating Physician/Extender: Rudene Re in Treatment: 55 Education Assessment Education Provided To: Patient Education Topics Provided Nutrition: Handouts: Nutrition Methods: Explain/Verbal Responses: State content correctly Electronic Signature(s) Signed: 06/27/2015 5:13:16 PM By: Curtis Sites Entered By: Curtis Sites on 06/27/2015 17:13:16 [name], [name] Chrissie Noa ([id number]) -------------------------------------------------------------------------------- Wound Assessment Details Patient Name: [name], [name] Date  of Service: 06/27/2015 1:30 PM Medical Record Number: [id number] Patient Account Number: [id number] Date of Birth/Sex: 1935/11/20 (79 y.o. Male) Treating RN: Curtis Sites Primary Care Physician: Terance Hart, DAVID Other Clinician: Referring Physician: Terance Hart, DAVID Treating Physician/Extender: Rudene Re in Treatment: 23 Wound Status Wound Number: 5 Primary Venous Leg Ulcer Etiology: Wound Location: Right Lower Leg - Medial Wound Status: Open Wounding Event: Gradually Appeared Comorbid Cataracts, Anemia, Hypertension, Date Acquired: 11/13/2014 History: Osteoarthritis Weeks Of Treatment: 23 Clustered Wound: No Photos Photo Uploaded By: Curtis Sites on 06/27/2015 17:20:56 Wound Measurements Length: (cm) 4.5 Width: (cm) 2.7 Depth: (cm) 0.1 Area: (cm) 9.543 Volume: (cm) 0.954 % Reduction in Area: 90.7% % Reduction in Volume: 96.9% Epithelialization: Medium (34-66%) Tunneling: No Undermining: No Wound Description Full Thickness Without Exposed Classification: Support Structures Wound Margin: Distinct, outline attached Exudate Medium Amount: Exudate Type: Serosanguineous Exudate Color: red, brown Foul Odor After Cleansing: Yes Due to Product Use: No Wound Bed Granulation Amount: Large (67-100%) Exposed Structure Granulation Quality: Pink, Pale Fascia Exposed: No Necrotic Amount: None Present (0%) Fat Layer Exposed: No [name], [name] ([id number]) Tendon Exposed: No Muscle Exposed: No Joint Exposed: No Bone Exposed: No Limited to Skin Breakdown Periwound Skin Texture Texture Color No Abnormalities Noted: No No Abnormalities Noted: No Callus: No Atrophie Blanche: No Crepitus: No Cyanosis: No Excoriation: No Ecchymosis: No Fluctuance: No Erythema: No Friable: No Hemosiderin Staining: No Induration: No Mottled: No Localized Edema: Yes Pallor: No Rash: No Rubor: No Scarring: No Temperature / Pain Moisture Temperature: No  Abnormality No Abnormalities Noted: No Dry / Scaly: No Maceration: No Moist: Yes Wound Preparation Ulcer Cleansing: Other: soap and water, Topical Anesthetic Applied: Other: lidocaine 4%, Electronic Signature(s) Signed: 06/27/2015 5:45:44 PM By: Curtis Sites Entered By: Curtis Sites on 06/27/2015 14:08:30 [name], [name] Chrissie Noa ([id number]) -------------------------------------------------------------------------------- Wound Assessment Details Patient Name: [name], [name] Date of Service: 06/27/2015 1:30 PM Medical Record Number: [id number] Patient Account Number: [id number] Date of Birth/Sex: Oct 23, 1935 (79 y.o. Male) Treating RN: Curtis Sites Primary Care Physician: Dorothey Baseman Other Clinician: Referring Physician: Dorothey Baseman Treating Physician/Extender: Rudene Re in Treatment: 23 Wound Status Wound  Number: 6 Primary Etiology: Skin Tear Wound Location: Right, Proximal Lower Leg Wound Status: Healed - Epithelialized Wounding Event: Gradually Appeared Date Acquired: 06/20/2015 Weeks Of Treatment: 1 Clustered Wound: No Photos Photo Uploaded By: Curtis Sites on 06/27/2015 17:20:56 Wound Measurements Length: (cm) 0 % Reduction i Width: (cm) 0 % Reduction i Depth: (cm) 0 Area: (cm) 0 Volume: (cm) 0 n Area: 100% n Volume: 100% Wound Description Classification: Partial Thickness Periwound Skin Texture Texture Color No Abnormalities Noted: No No Abnormalities Noted: No Moisture No Abnormalities Noted: No Electronic Signature(s) Signed: 06/27/2015 5:45:44 PM By: Ebbie Ridge ([id number]) Entered By: Curtis Sites on 06/27/2015 14:07:48 [name] ([id number]) -------------------------------------------------------------------------------- Vitals Details Patient Name: [name] Date of Service: 06/27/2015 1:30 PM Medical Record Number: [id number] Patient Account Number: [id number] Date of  Birth/Sex: [birth year] (79 y.o. Male) Treating RN: Curtis Sites Primary Care Physician: Dorothey Baseman Other Clinician: Referring Physician: Terance Hart, DAVID Treating Physician/Extender: Rudene Re in Treatment: 23 Vital Signs Time Taken: 13:54 Temperature (F): 97.9 Height (in): 68 Pulse (bpm): 72 Respiratory Rate (breaths/min): 20 Blood Pressure (mmHg): 160/74 Reference Range: 80 - 120 mg / dl Electronic Signature(s) Signed: 06/27/2015 5:45:44 PM By: Curtis Sites Entered By: Curtis Sites on 06/27/2015 13:55:06

## 2015-06-28 NOTE — Progress Notes (Signed)
DEQUANDRE, CORDOVA ([id number]) Visit Report for 06/27/2015 Chief Complaint Document Details Patient Name: [name], [name] Date of Service: 06/27/2015 1:30 PM Medical Record Number: [id number] Patient Account Number: [id number] Date of Birth/Sex: 12-Mar-1936 (79 y.o. Male) Treating RN: Curtis Sites Primary Care Physician: Dorothey Baseman Other Clinician: Referring Physician: Dorothey Baseman Treating Physician/Extender: Rudene Re in Treatment: 23 Information Obtained from: Patient Chief Complaint Patient presents for treatment of an open ulcer due to venous insufficiency. The patient has had a open wound on his right medial ankle for at least 3 years and was last seen in the wound clinic in February 2015. He was lost to follow-up after that. Electronic Signature(s) Signed: 06/27/2015 2:28:42 PM By: Evlyn Kanner MD, FACS Entered By: Evlyn Kanner on 06/27/2015 14:28:42 [name], [name] ([id number]) -------------------------------------------------------------------------------- HPI Details Patient Name: [name], [name] Date of Service: 06/27/2015 1:30 PM Medical Record Number: [id number] Patient Account Number: [id number] Date of Birth/Sex: 07-Feb-1936 (79 y.o. Male) Treating RN: Curtis Sites Primary Care Physician: Dorothey Baseman Other Clinician: Referring Physician: Dorothey Baseman Treating Physician/Extender: Rudene Re in Treatment: 23 History of Present Illness Location: right lower extremity ulceration Quality: Patient reports experiencing a dull pain to affected area(s). Severity: Patient states wound are getting worse. 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 Modifying Factors: Consults to this date include:has received treatment in the wound center in the past but stopped coming here since February 2015 Associated Signs and Symptoms: Patient reports having difficulty standing for long periods. HPI Description: 79 year old male who is known to have progressive weakness and has been in a nursing home for a while has had chronic lower extremity edema both legs and a large open wound on the right lower extremity which is has at least for about 3-4 years. The patient was seen in the wound clinic before and has been treated until February 2015 when he was lost to follow-up. Past medical history is significant for hypertension, gout, venous stasis ulcers, Parkinson's [name], constipation. He's not been a diabetic but his last hemoglobin A1c was 6 in March 2015. There is documentation that he's had endovenous ablation of bilateral varicose veins but we have no documentation about this and this may be done at least 4-5 years ago. 01/22/2015 -- he has his vascular test is scheduled for this afternoon. 01/29/2015 -- the patient's vascular test was canceled last week because he was unable to get onto the examining bed at [facility]. His Unna's boot was not applied either and the patient had a dressing placed by home health. Today when his dressing was removed we found a lot of maggots in his right lower extremity. 02/07/2015 -- Was seen in the vascular office by the PA and she noted that a bilateral venous duplex study did not show any DVT, SVT or reflux bilaterally. The patient was advised to continue with Unna's boot and would at some stage to require graduated compression stockings of the 20-30 mmHg variety. In the future lymphedema pumps would also be considered. 02/14/2015 -- his dressing is smelling quite a bit and there is a discoloration of the wound suggestive of an infection. Deep tissue cultures will be taken today. 02/21/2015 --  the culture  is back and it has growth moderate growth of Proteus and gram-negative rods sensitive to sulfa, ampicillin, cephazolin, Zosyn. He is already on doxycycline and his wound is looking clean and hence we will continue with this. 02/28/2015 -- he's been doing fine still on his antibiotics and has no fresh issues. 03/07/2015 -- I was told that because he lives in a nursing home the Apligraf was denied by his insurance company. 03/14/2015 -- we are awaiting samples and a trial of Puraply to be tried for his ulceration. 03/21/2015 -- his Apligraf was approved and he is here for his first application of Apligraf 03/28/2015 -- his wound has had quite a bit of secretion and needs to be changed and hence I will use the KALUM, MINNER. ([id number]) second application of Apligraf. 04/04/2015 -- he is here for a wound check but has a lot of secretions and hence his dressing needs to be taken down. 04/09/2015 -- he is here for his third application of Apligraf 04/18/2015 -- he is here for a wound check and though he does not have any overt infection he always has a foul odor to his leg. 04/25/2015 -- his wound has been doing much better and he has minimal drainage and the size is smaller. Of note he is on doxycycline. 05/02/2015 -- he is here for his fourth application of Apligraf 05/09/2015 -- he is here for a wound check and is bothered by a lot of odor from the wound. 05/16/2015 -- he has done very well, the odor and drainage is less with the wound dressing having been changed twice this week. He is here for his last application of Apligraf. 06/20/2015 -- an area superior to the previous wound has now opened up and this is a fairly superficial ulceration. Electronic Signature(s) Signed: 06/27/2015 2:28:47 PM By: Evlyn Kanner MD, FACS Entered By: Evlyn Kanner on 06/27/2015 14:28:47 [name], [name]  ([id number]) -------------------------------------------------------------------------------- Physical Exam Details Patient Name: [name], [name] Date of Service: 06/27/2015 1:30 PM Medical Record Number: [id number] Patient Account Number: [id number] Date of Birth/Sex: 01/22/1936 (79 y.o. Male) Treating RN: Curtis Sites Primary Care Physician: Dorothey Baseman Other Clinician: Referring Physician: Terance Hart, DAVID Treating Physician/Extender: Rudene Re in Treatment: 23 Constitutional . Pulse regular. Respirations normal and unlabored. Afebrile. . Eyes Nonicteric. Reactive to light. Ears, Nose, Mouth, and Throat Lips, teeth, and gums WNL.Marland Kitchen Moist mucosa without lesions. Neck supple and nontender. No palpable supraclavicular or cervical adenopathy. Normal sized without goiter. Respiratory WNL. No retractions.. Cardiovascular Pedal Pulses WNL. No clubbing, cyanosis or edema. Lymphatic No adneopathy. No adenopathy. No adenopathy. Musculoskeletal Adexa without tenderness or enlargement.. Digits and nails w/o clubbing, cyanosis, infection, petechiae, ischemia, or inflammatory conditions.. Integumentary (Hair, Skin) No suspicious lesions. No crepitus or fluctuance. No peri-wound warmth or erythema. No masses.Marland Kitchen Psychiatric Judgement and insight Intact.. No evidence of depression, anxiety, or agitation.. Notes the wound is looking excellent today and there is no evidence of cellulitis or surrounding discharge or odor. The wound was just washed with moist saline Electronic Signature(s) Signed: 06/27/2015 2:29:31 PM By: Evlyn Kanner MD, FACS Entered By: Evlyn Kanner on 06/27/2015 14:29:30 [name], [name] ([id number]) -------------------------------------------------------------------------------- Physician Orders Details Patient Name: [name] Date of Service: 06/27/2015 1:30 PM Medical Record Number: [id number] Patient Account Number: [id number] Date of  Birth/Sex: 04/20/1936 (79 y.o. Male) Treating RN: Curtis Sites Primary Care Physician: Dorothey Baseman Other Clinician: Referring Physician: Dorothey Baseman Treating Physician/Extender: Rudene Re in Treatment: 52 Verbal /  Phone Orders: Yes Clinician: Curtis Sites Read Back and Verified: Yes Diagnosis Coding Wound Cleansing Wound #5 Right,Medial Lower Leg o Cleanse wound with mild soap and water o May Shower, gently pat wound dry prior to applying new dressing. Skin Barriers/Peri-Wound Care Wound #5 Right,Medial Lower Leg o Moisturizing lotion Primary Wound Dressing Wound #5 Right,Medial Lower Leg o Promogran o Other: - sorbact Secondary Dressing Wound #5 Right,Medial Lower Leg o Gauze, ABD and Kerlix/Conform Dressing Change Frequency Wound #5 Right,Medial Lower Leg o Change dressing every week Follow-up Appointments Wound #5 Right,Medial Lower Leg o Return Appointment in 1 week. Edema Control Wound #5 Right,Medial Lower Leg o 4-Layer Compression System - Right Lower Extremity Electronic Signature(s) Signed: 06/27/2015 5:05:23 PM By: Evlyn Kanner MD, FACS Signed: 06/27/2015 5:45:44 PM By: Ebbie Ridge ([id number]) Entered By: Curtis Sites on 06/27/2015 14:14:30 [name], [name] ([id number]) -------------------------------------------------------------------------------- Problem List Details Patient Name: [name], [name] Date of Service: 06/27/2015 1:30 PM Medical Record Number: [id number] Patient Account Number: [id number] Date of Birth/Sex: 06/19/1936 (79 y.o. 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 Date of Service: 06/27/2015 1:30 PM Medical Record Number: [id number] Patient Account Number: [id number] Date of Birth/Sex: [birth year] (79 y.o. Male) Treating RN: Curtis Sites Primary Care Physician: Dorothey Baseman Other Clinician: Referring Physician: Dorothey Baseman Treating Physician/Extender: Rudene Re in Treatment: 23 Subjective Chief Complaint Information obtained from Patient Patient presents for treatment of an open ulcer due to venous insufficiency. The patient has had a open wound on his right medial ankle for at least 3 years and was last seen in the wound clinic in February 2015. He was lost to follow-up after that. History of Present Illness (HPI) The following HPI elements were documented for the patient's wound: Location: right lower extremity ulceration Quality: Patient reports experiencing a dull pain to affected area(s). Severity: Patient states wound are getting worse. 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 Modifying Factors: Consults to this date include:has received treatment in the wound center  in the past but stopped coming here since February 2015 Associated Signs and Symptoms: Patient reports having difficulty standing for long periods. 79 year old male who is known to have progressive weakness and has been in a nursing home for a while has had chronic lower extremity edema both legs and a large open wound on the right lower extremity which is has at least for about 3-4 years. The patient was seen in the wound clinic before and has been treated until February 2015 when he was lost to follow-up. Past medical history is significant for hypertension,  gout, venous stasis ulcers, Parkinson's [name], constipation. He's not been a diabetic but his last hemoglobin A1c was 6 in March 2015. There is documentation that he's had endovenous ablation of bilateral varicose veins but we have no documentation about this and this may be done at least 4-5 years ago. 01/22/2015 -- he has his vascular test is scheduled for this afternoon. 01/29/2015 -- the patient's vascular test was canceled last week because he was unable to get onto the examining bed at [facility]. His Unna's boot was not applied either and the patient had a dressing placed by home health. Today when his dressing was removed we found a lot of maggots in his right lower extremity. 02/07/2015 -- Was seen in the vascular office by the PA and she noted that a bilateral venous duplex study did not show any DVT, SVT or reflux bilaterally. The patient was advised to continue with Unna's boot and would at some stage to require graduated compression stockings of the 20-30 mmHg variety. In the future lymphedema pumps would also be considered. 02/14/2015 -- his dressing is smelling quite a bit and there is a discoloration of the wound suggestive of an DORA, CLAUSS. ([id number]) infection. Deep tissue cultures will be taken today. 02/21/2015 -- the culture is back and it has growth moderate growth of Proteus and gram-negative  rods sensitive to sulfa, ampicillin, cephazolin, Zosyn. He is already on doxycycline and his wound is looking clean and hence we will continue with this. 02/28/2015 -- he's been doing fine still on his antibiotics and has no fresh issues. 03/07/2015 -- I was told that because he lives in a nursing home the Apligraf was denied by his insurance company. 03/14/2015 -- we are awaiting samples and a trial of Puraply to be tried for his ulceration. 03/21/2015 -- his Apligraf was approved and he is here for his first application of Apligraf 03/28/2015 -- his wound has had quite a bit of secretion and needs to be changed and hence I will use the second application of Apligraf. 04/04/2015 -- he is here for a wound check but has a lot of secretions and hence his dressing needs to be taken down. 04/09/2015 -- he is here for his third application of Apligraf 04/18/2015 -- he is here for a wound check and though he does not have any overt infection he always has a foul odor to his leg. 04/25/2015 -- his wound has been doing much better and he has minimal drainage and the size is smaller. Of note he is on doxycycline. 05/02/2015 -- he is here for his fourth application of Apligraf 05/09/2015 -- he is here for a wound check and is bothered by a lot of odor from the wound. 05/16/2015 -- he has done very well, the odor and drainage is less with the wound dressing having been changed twice this week. He is here for his last application of Apligraf. 06/20/2015 -- an area superior to the previous wound has now opened up and this is a fairly superficial ulceration. Objective Constitutional Pulse regular. Respirations normal and unlabored. Afebrile. Vitals Time Taken: 1:54 PM, Height: 68 in, Temperature: 97.9 F, Pulse: 72 bpm, Respiratory Rate: 20 breaths/min, Blood Pressure: 160/74 mmHg. Eyes Nonicteric. Reactive to light. Ears, Nose, Mouth, and Throat Lips, teeth, and gums WNL.Marland Kitchen Moist mucosa without  lesions. Neck supple and nontender. No palpable supraclavicular or cervical adenopathy. Normal sized without goiter. [name], [name] ([id number]) Respiratory WNL. No retractions.. Cardiovascular Pedal Pulses WNL. No clubbing,  cyanosis or edema. Lymphatic No adneopathy. No adenopathy. No adenopathy. Musculoskeletal Adexa without tenderness or enlargement.. Digits and nails w/o clubbing, cyanosis, infection, petechiae, ischemia, or inflammatory conditions.Marland Kitchen Psychiatric Judgement and insight Intact.. No evidence of depression, anxiety, or agitation.. General Notes: the wound is looking excellent today and there is no evidence of cellulitis or surrounding discharge or odor. The wound was just washed with moist saline Integumentary (Hair, Skin) No suspicious lesions. No crepitus or fluctuance. No peri-wound warmth or erythema. No masses.. Wound #5 status is Open. Original cause of wound was Gradually Appeared. The wound is located on the Right,Medial Lower Leg. The wound measures 4.5cm length x 2.7cm width x 0.1cm depth; 9.543cm^2 area and 0.954cm^3 volume. The wound is limited to skin breakdown. There is no tunneling or undermining noted. There is a medium amount of serosanguineous drainage noted. The wound margin is distinct with the outline attached to the wound base. There is large (67-100%) pink, pale granulation within the wound bed. There is no necrotic tissue within the wound bed. The periwound skin appearance exhibited: Localized Edema, Moist. The periwound skin appearance did not exhibit: Callus, Crepitus, Excoriation, Fluctuance, Friable, Induration, Rash, Scarring, Dry/Scaly, Maceration, Atrophie Blanche, Cyanosis, Ecchymosis, Hemosiderin Staining, Mottled, Pallor, Rubor, Erythema. Periwound temperature was noted as No Abnormality. Wound #6 status is Healed - Epithelialized. Original cause of wound was Gradually Appeared. The wound is located on the Right,Proximal Lower Leg.  The wound measures 0cm length x 0cm width x 0cm depth; 0cm^2 area and 0cm^3 volume. 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 Skin Barriers/Peri-Wound Care: Wound #5 Right,Medial Lower Leg: Moisturizing lotion Primary Wound Dressing: Wound #5 Right,Medial Lower Leg: Promogran Other: - sorbact Secondary Dressing: Wound #5 Right,Medial Lower Leg: Gauze, ABD and Kerlix/Conform Dressing Change Frequency: Wound #5 Right,Medial Lower Leg: Change dressing every week Follow-up Appointments: Wound #5 Right,Medial Lower Leg: Return Appointment in 1 week. Edema Control: Wound #5 Right,Medial Lower Leg: 4-Layer Compression System - Right Lower Extremity I have recommended: 1. They will use a layer of collagen, Sorbact, and foam. 2. we will continue with the 4-layer Profore wrap. 3. He will continue with doxycycline 100 mg by mouth twice a day. 4. we will see him back next week. 5. for the area which is more superior we'll apply a small piece of Prisma Electronic Signature(s) Signed: 06/27/2015 2:30:18 PM By: Evlyn Kanner MD, FACS GEDDY, BOYDSTUN ([id number]) Entered By: Evlyn Kanner on 06/27/2015 14:30:18 HAJI, DELAINE ([id number]) -------------------------------------------------------------------------------- SuperBill Details Patient Name: [name] Date of Service: 06/27/2015 Medical Record Number: [id number] Patient Account Number: [id number] Date of Birth/Sex: 1935/12/29 (79 y.o. 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 E66.01 Morbid (severe) obesity due to excess calories I89.0 Lymphedema, not elsewhere classified Facility Procedures CPT4: Description Modifier Quantity Code [id number] (Facility Use Only) [phone] - APPLY MULTLAY COMPRS LWR RT 1  LEG Physician Procedures CPT4 Code Description: [id number] - WC PHYS LEVEL 3 - EST PT ICD-10 Description Diagnosis I87.311 Chronic venous hypertension (idiopathic) with ulcer of 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 I89.0 Lymphedema, not elsewhere  classified Modifier: right lower s of muscle Quantity: 1 extremity Electronic Signature(s) Signed: 06/27/2015 5:11:34 PM By: Curtis Sites Signed: 06/27/2015 5:19:49 PM By: Evlyn Kanner MD, FACS Previous Signature: 06/27/2015 2:30:53 PM Version By: Evlyn Kanner MD, FACS Entered By: Curtis Sites on 06/27/2015 17:11:34

## 2015-07-04 ENCOUNTER — Encounter (HOSPITAL_BASED_OUTPATIENT_CLINIC_OR_DEPARTMENT_OTHER): Payer: Medicare Other | Admitting: General Surgery

## 2015-07-04 DIAGNOSIS — I87311 Chronic venous hypertension (idiopathic) with ulcer of right lower extremity: Secondary | ICD-10-CM | POA: Diagnosis not present

## 2015-07-04 DIAGNOSIS — L97919 Non-pressure chronic ulcer of unspecified part of right lower leg with unspecified severity: Principal | ICD-10-CM

## 2015-07-04 DIAGNOSIS — I87313 Chronic venous hypertension (idiopathic) with ulcer of bilateral lower extremity: Secondary | ICD-10-CM

## 2015-07-04 DIAGNOSIS — L97929 Non-pressure chronic ulcer of unspecified part of left lower leg with unspecified severity: Principal | ICD-10-CM

## 2015-07-04 NOTE — Progress Notes (Signed)
seeiheal 

## 2015-07-05 NOTE — Progress Notes (Signed)
DOV, DILL ([id number]) Visit Report for 07/04/2015 Arrival Information Details Patient Name: [name], [name] Date of Service: 07/04/2015 12:45 PM Medical Record Number: [id number] Patient Account Number: [id number] Date of Birth/Sex: August 04, 1935 (79 y.o. Male) Treating RN: Curtis Sites Primary Care Physician: Dorothey Baseman Other Clinician: Referring Physician: Terance Hart, DAVID Treating Physician/Extender: Elayne Snare in Treatment: 24 Visit Information History Since Last Visit Added or deleted any medications: No Patient Arrived: Wheel Chair Any new allergies or adverse reactions: No Arrival Time: 12:47 Had a fall or experienced change in No activities of daily living that may affect Accompanied By: self risk of falls: Transfer Assistance: None Signs or symptoms of abuse/neglect since last No Patient Identification Verified: Yes visito Secondary Verification Process Yes Hospitalized since last visit: No Completed: Pain Present Now: No Patient Requires Transmission-Based No Precautions: Patient Has Alerts: No Electronic Signature(s) Signed: 07/04/2015 4:57:31 PM By: Curtis Sites Entered By: Curtis Sites on 07/04/2015 12:48:09 [name] ([id number]) -------------------------------------------------------------------------------- Encounter Discharge Information Details Patient Name: [name] Date of Service: 07/04/2015 12:45 PM Medical Record Number: [id number] Patient Account Number: [id number] Date of Birth/Sex: Oct 03, 1935 (79 y.o. Male) Treating RN: Curtis Sites Primary Care Physician: Dorothey Baseman Other Clinician: Referring Physician: Dorothey Baseman Treating Physician/Extender: Elayne Snare in Treatment: 24 Encounter Discharge Information Items Discharge Pain Level: 0 Discharge Condition: Stable Ambulatory Status: Wheelchair Discharge Destination: Nursing Home Transportation: Private Auto Accompanied By:  self Schedule Follow-up Appointment: Yes Medication Reconciliation completed and provided to Patient/Care No [name]: Provided on Clinical Summary of Care: 07/04/2015 Form Type Recipient Paper Patient [facility] Electronic Signature(s) Signed: 07/04/2015 2:03:17 PM By: Ardath Sax MD Previous Signature: 07/04/2015 1:39:32 PM Version By: Curtis Sites Previous Signature: 07/04/2015 1:26:30 PM Version By: Francie Massing Entered By: Ardath Sax on 07/04/2015 14:03:17 [name], [name] ([id number]) -------------------------------------------------------------------------------- Lower Extremity Assessment Details Patient Name: [name] Date of Service: 07/04/2015 12:45 PM Medical Record Number: [id number] Patient Account Number: [id number] Date of Birth/Sex: 09-Oct-1935 (79 y.o. Male) Treating RN: Curtis Sites Primary Care Physician: Terance Hart, DAVID Other Clinician: Referring Physician: Terance Hart, DAVID Treating Physician/Extender: Elayne Snare in Treatment: 24 Edema Assessment Assessed: [Left: No] [Right: No] Edema: [Left: Ye] [Right: s] Calf Left: Right: Point of Measurement: 40 cm From Medial Instep cm 38.9 cm Ankle Left: Right: Point of Measurement: 8 cm From Medial Instep cm 28.5 cm Vascular Assessment Pulses: Posterior Tibial Dorsalis Pedis Palpable: [Right:Yes] Extremity colors, hair growth, and conditions: Extremity Color: [Right:Hyperpigmented] Hair Growth on Extremity: [Right:No] Temperature of Extremity: [Right:Warm] Capillary Refill: [Right:< 3 seconds] Electronic Signature(s) Signed: 07/04/2015 4:57:31 PM By: Curtis Sites Entered By: Curtis Sites on 07/04/2015 12:55:31 [name] ([id number]) -------------------------------------------------------------------------------- Multi Wound Chart Details Patient Name: [name] Date of Service: 07/04/2015 12:45 PM Medical Record Number: [id number] Patient Account Number:  [id number] Date of Birth/Sex: 1936/06/23 (79 y.o. Male) Treating RN: Curtis Sites Primary Care Physician: Dorothey Baseman Other Clinician: Referring Physician: Terance Hart, DAVID Treating Physician/Extender: Elayne Snare in Treatment: 24 Vital Signs Height(in): 68 Pulse(bpm): 70 Weight(lbs): Blood Pressure 152/66 (mmHg): Body Mass Index(BMI): Temperature(F): 98.2 Respiratory Rate 18 (breaths/min): Photos: [5:No Photos] [N/A:N/A] Wound Location: [5:Right Lower Leg - Medial] [N/A:N/A] Wounding Event: [5:Gradually Appeared] [N/A:N/A] Primary Etiology: [5:Venous Leg Ulcer] [N/A:N/A] Comorbid History: [5:Cataracts, Anemia, Hypertension, Osteoarthritis] [N/A:N/A] Date Acquired: [5:11/13/2014] [N/A:N/A] Weeks of Treatment: [5:24] [N/A:N/A] Wound Status: [5:Open] [N/A:N/A] Measurements L x W x D 4.5x2.4x0.1 [N/A:N/A] (cm) Area (cm) : [5:8.482] [N/A:N/A] Volume (cm) : [5:0.848] [N/A:N/A] % Reduction in Area: [5:91.70%] [N/A:N/A] %  Reduction in Volume: 97.20% [N/A:N/A] Classification: [5:Full Thickness Without Exposed Support Structures] [N/A:N/A] Exudate Amount: [5:Medium] [N/A:N/A] Exudate Type: [5:Serosanguineous] [N/A:N/A] Exudate Color: [5:red, brown] [N/A:N/A] Foul Odor After [5:Yes] [N/A:N/A] Cleansing: Odor Anticipated Due to No [N/A:N/A] Product Use: Wound Margin: [5:Distinct, outline attached] [N/A:N/A] Granulation Amount: [5:Large (67-100%)] [N/A:N/A] Granulation Quality: [5:Pink, Pale] [N/A:N/A] Necrotic Amount: [5:None Present (0%)] [N/A:N/A] Exposed Structures: Fascia: No N/A N/A Fat: No Tendon: No Muscle: No Joint: No Bone: No Limited to Skin Breakdown Epithelialization: Medium (34-66%) N/A N/A Periwound Skin Texture: Edema: Yes N/A N/A Excoriation: No Induration: No Callus: No Crepitus: No Fluctuance: No Friable: No Rash: No Scarring: No Periwound Skin Moist: Yes N/A N/A Moisture: Maceration: No Dry/Scaly: No Periwound Skin Color:  Atrophie Blanche: No N/A N/A Cyanosis: No Ecchymosis: No Erythema: No Hemosiderin Staining: No Mottled: No Pallor: No Rubor: No Temperature: No Abnormality N/A N/A Tenderness on No N/A N/A Palpation: Wound Preparation: Ulcer Cleansing: Other: N/A N/A soap and water Topical Anesthetic Applied: None Treatment Notes Electronic Signature(s) Signed: 07/04/2015 4:57:31 PM By: Curtis Sites Entered By: Curtis Sites on 07/04/2015 13:04:21 [name] ([id number]) -------------------------------------------------------------------------------- Multi-Disciplinary Care Plan Details Patient Name: [name], [name] Date of Service: 07/04/2015 12:45 PM Medical Record Number: [id number] Patient Account Number: [id number] Date of Birth/Sex: Dec 08, 1935 (79 y.o. Male) Treating RN: Curtis Sites Primary Care Physician: Dorothey Baseman Other Clinician: Referring Physician: Dorothey Baseman Treating Physician/Extender: Elayne Snare in Treatment: 24 Active Inactive Orientation to the Wound Care Program Nursing Diagnoses: Knowledge deficit related to the wound healing center program Goals: Patient/caregiver will verbalize understanding of the Wound Healing Center Program Date Initiated: 01/15/2015 Goal Status: Active Interventions: Provide education on orientation to the wound center Notes: Venous Leg Ulcer Nursing Diagnoses: Knowledge deficit related to disease process and management Potential for venous Insuffiency (use before diagnosis confirmed) Goals: Patient will maintain optimal edema control Date Initiated: 01/15/2015 Goal Status: Active Patient/caregiver will verbalize understanding of disease process and disease management Date Initiated: 01/15/2015 Goal Status: Active Verify adequate tissue perfusion prior to therapeutic compression application Date Initiated: 01/15/2015 Goal Status: Active Interventions: Assess peripheral edema status every  visit. Compression as ordered Provide education on venous insufficiency [name], [name] ([id number]) Treatment Activities: Test ordered outside of clinic : 07/04/2015 Therapeutic compression applied : 07/04/2015 Venous Duplex Doppler : 07/04/2015 Notes: Wound/Skin Impairment Nursing Diagnoses: Impaired tissue integrity Knowledge deficit related to ulceration/compromised skin integrity Goals: Patient/caregiver will verbalize understanding of skin care regimen Date Initiated: 01/15/2015 Goal Status: Active Ulcer/skin breakdown will have a volume reduction of 30% by week 4 Date Initiated: 01/15/2015 Goal Status: Active Ulcer/skin breakdown will have a volume reduction of 50% by week 8 Date Initiated: 01/15/2015 Goal Status: Active Ulcer/skin breakdown will have a volume reduction of 80% by week 12 Date Initiated: 01/15/2015 Goal Status: Active Ulcer/skin breakdown will heal within 14 weeks Date Initiated: 01/15/2015 Goal Status: Active Interventions: Assess patient/caregiver ability to perform ulcer/skin care regimen upon admission and as needed Assess ulceration(s) every visit Provide education on ulcer and skin care Notes: Electronic Signature(s) Signed: 07/04/2015 4:57:31 PM By: Curtis Sites Entered By: Curtis Sites on 07/04/2015 13:03:28 [name] ([id number]) -------------------------------------------------------------------------------- Patient/Caregiver Education Details Patient Name: [name] Date of Service: 07/04/2015 12:45 PM Medical Record Number: [id number] Patient Account Number: [id number] Date of Birth/Gender: 11/18/1935 (79 y.o. Male) Treating RN: Curtis Sites Primary Care Physician: Dorothey Baseman Other Clinician: Referring Physician: Dorothey Baseman Treating Physician/Extender: Elayne Snare in Treatment: 24 Education Assessment Education Provided To: Patient Education Topics  Provided Venous: Handouts: Other: please  inform snf nurse/md that you feel you are retaining fluid Methods: Explain/Verbal Responses: State content correctly Electronic Signature(s) Signed: 07/04/2015 2:03:29 PM By: Ardath Sax MD Previous Signature: 07/04/2015 1:40:17 PM Version By: Curtis Sites Entered By: Ardath Sax on 07/04/2015 14:03:28 [name], [name] ([id number]) -------------------------------------------------------------------------------- Wound Assessment Details Patient Name: [name], [name] Date of Service: 07/04/2015 12:45 PM Medical Record Number: [id number] Patient Account Number: [id number] Date of Birth/Sex: [birth year] (79 y.o. Male) Treating RN: Curtis Sites Primary Care Physician: Terance Hart, DAVID Other Clinician: Referring Physician: Terance Hart, DAVID Treating Physician/Extender: Elayne Snare in Treatment: 24 Wound Status Wound Number: 5 Primary Venous Leg Ulcer Etiology: Wound Location: Right Lower Leg - Medial Wound Status: Open Wounding Event: Gradually Appeared Comorbid Cataracts, Anemia, Hypertension, Date Acquired: 11/13/2014 History: Osteoarthritis Weeks Of Treatment: 24 Clustered Wound: No Photos Photo Uploaded By: Curtis Sites on 07/04/2015 13:37:48 Wound Measurements Length: (cm) 4.5 Width: (cm) 2.4 Depth: (cm) 0.1 Area: (cm) 8.482 Volume: (cm) 0.848 % Reduction in Area: 91.7% % Reduction in Volume: 97.2% Epithelialization: Medium (34-66%) Tunneling: No Undermining: No Wound Description Full Thickness Without Exposed Classification: Support Structures Wound Margin: Distinct, outline attached Exudate Medium Amount: Exudate Type: Serosanguineous Exudate Color: red, brown Foul Odor After Cleansing: Yes Due to Product Use: No Wound Bed Granulation Amount: Large (67-100%) Exposed Structure Granulation Quality: Pink, Pale Fascia Exposed: No Necrotic Amount: None Present (0%) Fat Layer Exposed: No [name], [name] ([id number]) Tendon Exposed:  No Muscle Exposed: No Joint Exposed: No Bone Exposed: No Limited to Skin Breakdown Periwound Skin Texture Texture Color No Abnormalities Noted: No No Abnormalities Noted: No Callus: No Atrophie Blanche: No Crepitus: No Cyanosis: No Excoriation: No Ecchymosis: No Fluctuance: No Erythema: No Friable: No Hemosiderin Staining: No Induration: No Mottled: No Localized Edema: Yes Pallor: No Rash: No Rubor: No Scarring: No Temperature / Pain Moisture Temperature: No Abnormality No Abnormalities Noted: No Dry / Scaly: No Maceration: No Moist: Yes Wound Preparation Ulcer Cleansing: Other: soap and water, Topical Anesthetic Applied: None Treatment Notes Wound #5 (Right, Medial Lower Leg) 1. Cleansed with: Cleanse wound with antibacterial soap and water 4. Dressing Applied: Promogran Other dressing (specify in notes) 5. Secondary Dressing Applied ABD Pad 7. Secured with 4-Layer Compression System - Right Lower Extremity Notes sorbact, carboflex Electronic Signature(s) Signed: 07/04/2015 4:57:31 PM By: Curtis Sites Entered By: Curtis Sites on 07/04/2015 13:02:49 [name], [name] ([id number]) [name], [name] ([id number]) -------------------------------------------------------------------------------- Vitals Details Patient Name: [name], [name] Date of Service: 07/04/2015 12:45 PM Medical Record Number: [id number] Patient Account Number: [id number] Date of Birth/Sex: 10-25-1935 (79 y.o. Male) Treating RN: Curtis Sites Primary Care Physician: Terance Hart, DAVID Other Clinician: Referring Physician: Terance Hart, DAVID Treating Physician/Extender: Elayne Snare in Treatment: 24 Vital Signs Time Taken: 12:48 Temperature (F): 98.2 Height (in): 68 Pulse (bpm): 70 Respiratory Rate (breaths/min): 18 Blood Pressure (mmHg): 152/66 Reference Range: 80 - 120 mg / dl Electronic Signature(s) Signed: 07/04/2015 4:57:31 PM By: Curtis Sites Entered By:  Curtis Sites on 07/04/2015 12:49:11

## 2015-07-05 NOTE — Progress Notes (Signed)
[name], [name] ([id number]) Visit Report for 07/04/2015 Chief Complaint Document Details Patient Name: [name], [name] Date of Service: 07/04/2015 12:45 PM Medical Record Number: [id number] Patient Account Number: [id number] Date of Birth/Sex: 1936/07/01 (79 y.o. Male) Treating RN: [name] Primary Care Physician: [name] Other Clinician: Referring Physician: Dorothey [name] Treating Physician/Extender: [name] in Treatment: 24 Information Obtained from: Patient Chief Complaint Patient presents for treatment of an open ulcer due to venous insufficiency. The patient has had a open wound on his right medial ankle for at least 3 years and was last seen in the wound clinic in February 2015. He was lost to follow-up after that. Electronic Signature(s) Signed: 07/04/2015 2:00:02 PM By: [name] Sax MD Entered By: [name] on 07/04/2015 14:00:02 [name], [name] ([id number]) -------------------------------------------------------------------------------- HPI Details Patient Name: [name], [name] Date of Service: 07/04/2015 12:45 PM Medical Record Number: [id number] Patient Account Number: [id number] Date of Birth/Sex: February 06, 1936 (79 y.o. Male) Treating RN: [name] Primary Care Physician: [name] Other Clinician: Referring Physician: Terance Hart, [name] Treating Physician/Extender: [name] in Treatment: 24 History of Present Illness Location: right lower extremity ulceration Quality: Patient reports experiencing a dull pain to affected area(s). Severity: Patient states wound are getting worse. 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 Modifying Factors: Consults to this date include:has received treatment in the wound center in the past but stopped coming here since February 2015 Associated Signs and Symptoms: Patient reports having difficulty standing for long periods. HPI Description: 79 year old male who is known to have progressive weakness and has been in a nursing home for a while has had chronic lower extremity edema both legs and a large open wound on the right lower extremity which is has at least for about 3-4 years. The patient was seen in the wound clinic before and has been treated until February 2015 when he was lost to follow-up. Past medical history is significant for hypertension, gout, venous stasis ulcers, Parkinson's [name], constipation. He's not been a diabetic but his last hemoglobin A1c was 6 in March 2015. There is documentation that he's had endovenous ablation of bilateral varicose veins but we have no documentation about this and this may be done at least 4-5 years ago. 01/22/2015 -- he has his vascular test is scheduled for this afternoon. 01/29/2015 -- the patient's vascular test was canceled last week because he was unable to get onto the examining bed at [facility]. His Unna's boot was not applied either and the patient had a dressing placed by home health. Today when his dressing was removed we found a lot of maggots in his right lower extremity. 02/07/2015 -- Was seen in the vascular office by the PA and she noted that a bilateral venous duplex study did not show any DVT, SVT or reflux bilaterally. The patient was advised to continue with Unna's boot and would at some stage to require graduated compression stockings of the 20-30 mmHg variety. In the future lymphedema pumps would also be considered. 02/14/2015 -- his dressing is smelling quite a bit and there is a discoloration of the wound suggestive of an infection. Deep tissue cultures will be taken today. 02/21/2015 --  the culture  is back and it has growth moderate growth of Proteus and gram-negative rods sensitive to sulfa, ampicillin, cephazolin, Zosyn. He is already on doxycycline and his wound is looking clean and hence we will continue with this. 02/28/2015 -- he's been doing fine still on his antibiotics and has no fresh issues. 03/07/2015 -- I was told that because he lives in a nursing home the Apligraf was denied by his insurance company. 03/14/2015 -- we are awaiting samples and a trial of Puraply to be tried for his ulceration. 03/21/2015 -- his Apligraf was approved and he is here for his first application of Apligraf 03/28/2015 -- his wound has had quite a bit of secretion and needs to be changed and hence I will use the [name], [name] ([id number]) second application of Apligraf. 04/04/2015 -- he is here for a wound check but has a lot of secretions and hence his dressing needs to be taken down. 04/09/2015 -- he is here for his third application of Apligraf 04/18/2015 -- he is here for a wound check and though he does not have any overt infection he always has a foul odor to his leg. 04/25/2015 -- his wound has been doing much better and he has minimal drainage and the size is smaller. Of note he is on doxycycline. 05/02/2015 -- he is here for his fourth application of Apligraf 05/09/2015 -- he is here for a wound check and is bothered by a lot of odor from the wound. 05/16/2015 -- he has done very well, the odor and drainage is less with the wound dressing having been changed twice this week. He is here for his last application of Apligraf. 06/20/2015 -- an area superior to the previous wound has now opened up and this is a fairly superficial ulceration. Electronic Signature(s) Signed: 07/04/2015 2:00:35 PM By: [name] Sax MD Entered By: [name] on 07/04/2015 14:00:35 [name], [name] ([id number]) -------------------------------------------------------------------------------- Physical  Exam Details Patient Name: [name] Date of Service: 07/04/2015 12:45 PM Medical Record Number: [id number] Patient Account Number: [id number] Date of Birth/Sex: September 10, 1935 (79 y.o. Male) Treating RN: [name] Primary Care Physician: [name] Other Clinician: Referring Physician: Dorothey [name] Treating Physician/Extender: [name] in Treatment: 24 Electronic Signature(s) Signed: 07/04/2015 2:00:44 PM By: [name] Sax MD Entered By: [name] on 07/04/2015 14:00:44 [name], [name] ([id number]) -------------------------------------------------------------------------------- Physician Orders Details Patient Name: [name], [name] Date of Service: 07/04/2015 12:45 PM Medical Record Number: [id number] Patient Account Number: [id number] Date of Birth/Sex: 1935/11/03 (79 y.o. Male) Treating RN: [name] Primary Care Physician: [name] Other Clinician: Referring Physician: Dorothey [name] Treating Physician/Extender: [name] in Treatment: 62 Verbal / Phone Orders: Yes Clinician: Curtis [name] Read Back and Verified: Yes Diagnosis Coding Wound Cleansing Wound #5 Right,Medial Lower Leg o Cleanse wound with mild soap and water o May Shower, gently pat wound dry prior to applying new dressing. Skin Barriers/Peri-Wound Care Wound #5 Right,Medial Lower Leg o Moisturizing lotion Primary Wound Dressing Wound #5 Right,Medial Lower Leg o Promogran o Other: - sorbact Secondary Dressing Wound #5 Right,Medial Lower Leg o Gauze, ABD and Kerlix/Conform - carboflex Dressing Change Frequency Wound #5 Right,Medial Lower Leg o Change dressing every week Follow-up Appointments Wound #5 Right,Medial Lower Leg o Return Appointment in 1 week. Edema Control Wound #5 Right,Medial Lower Leg o 4-Layer Compression System - Right Lower Extremity Electronic Signature(s) Signed: 07/04/2015 4:57:31 PM By: [name] Signed: 07/05/2015 8:01:58 AM By: [name] Sax MD [name] Cockayne  P. ([id number]) Entered By: [name] on 07/04/2015 13:05:19 [name] ([id number]) -------------------------------------------------------------------------------- Problem List Details Patient Name: [name], [name] Date of Service: 07/04/2015 12:45 PM Medical Record Number: [id number] Patient Account Number: [id number] Date of Birth/Sex: 05-Feb-1936 (79 y.o. 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 Date of Service: 07/04/2015 12:45 PM Medical Record Number: [id number] Patient Account Number: [id number] Date of Birth/Sex: 19-Jul-1935 (79 y.o. Male) Treating RN: [name] Primary Care Physician: [name] Other Clinician: Referring Physician: Dorothey [name] Treating Physician/Extender: [name] in Treatment: 24 Subjective Chief Complaint Information obtained from Patient Patient presents for treatment of an open ulcer due to venous insufficiency. The patient has had a open wound on his right medial ankle for at  least 3 years and was last seen in the wound clinic in February 2015. He was lost to follow-up after that. History of Present Illness (HPI) The following HPI elements were documented for the patient's wound: Location: right lower extremity ulceration Quality: Patient reports experiencing a dull pain to affected area(s). Severity: Patient states wound are getting worse. 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 Modifying Factors: Consults to this date include:has received treatment in the wound center in the past but stopped coming here since February 2015 Associated Signs and Symptoms: Patient reports having difficulty standing for long periods. 79 year old male who is known to have progressive weakness and has been in a nursing home for a while has had chronic lower extremity edema both legs and a large open wound on the right lower extremity which is has at least for about 3-4 years. The patient was seen in the wound clinic before and has been treated until February 2015 when he was lost to follow-up. Past medical history is significant for hypertension, gout, venous stasis ulcers, Parkinson's [name], constipation. He's not been a diabetic but his last hemoglobin A1c was 6 in March 2015. There is documentation that he's had endovenous ablation of bilateral varicose veins but we have no documentation about this and this may be done at least 4-5 years ago. 01/22/2015 -- he has his vascular test is scheduled for this afternoon. 01/29/2015 -- the patient's vascular test was canceled last week because he was unable to get onto the examining bed at [facility]. His Unna's boot was not applied either and the  patient had a dressing placed by home health. Today when his dressing was removed we found a lot of maggots in his right lower extremity. 02/07/2015 -- Was seen in the vascular office by the PA and she noted that a bilateral venous duplex  study did not show any DVT, SVT or reflux bilaterally. The patient was advised to continue with Unna's boot and would at some stage to require graduated compression stockings of the 20-30 mmHg variety. In the future lymphedema pumps would also be considered. 02/14/2015 -- his dressing is smelling quite a bit and there is a discoloration of the wound suggestive of an [name], [name] ([id number]) infection. Deep tissue cultures will be taken today. 02/21/2015 -- the culture is back and it has growth moderate growth of Proteus and gram-negative rods sensitive to sulfa, ampicillin, cephazolin, Zosyn. He is already on doxycycline and his wound is looking clean and hence we will continue with this. 02/28/2015 -- he's been doing fine still on his antibiotics and has no fresh issues. 03/07/2015 -- I was told that because he lives in a nursing home the Apligraf was denied by his insurance company. 03/14/2015 -- we are awaiting samples and a trial of Puraply to be tried for his ulceration. 03/21/2015 -- his Apligraf was approved and he is here for his first application of Apligraf 03/28/2015 -- his wound has had quite a bit of secretion and needs to be changed and hence I will use the second application of Apligraf. 04/04/2015 -- he is here for a wound check but has a lot of secretions and hence his dressing needs to be taken down. 04/09/2015 -- he is here for his third application of Apligraf 04/18/2015 -- he is here for a wound check and though he does not have any overt infection he always has a foul odor to his leg. 04/25/2015 -- his wound has been doing much better and he has minimal drainage and the size is smaller. Of note he is on doxycycline. 05/02/2015  -- he is here for his fourth application of Apligraf 05/09/2015 -- he is here for a wound check and is bothered by a lot of odor from the wound. 05/16/2015 -- he has done very well, the odor and drainage is less with the wound dressing having been changed twice this week. He is here for his last application of Apligraf. 06/20/2015 -- an area superior to the previous wound has now opened up and this is a fairly superficial ulceration. Objective Constitutional Vitals Time Taken: 12:48 PM, Height: 68 in, Temperature: 98.2 F, Pulse: 70 bpm, Respiratory Rate: 18 breaths/min, Blood Pressure: 152/66 mmHg. Integumentary (Hair, Skin) Wound #5 status is Open. Original cause of wound was Gradually Appeared. The wound is located on the Right,Medial Lower Leg. The wound measures 4.5cm length x 2.4cm width x 0.1cm depth; 8.482cm^2 area and 0.848cm^3 volume. The wound is limited to skin breakdown. There is no tunneling or undermining noted. There is a medium amount of serosanguineous drainage noted. The wound margin is distinct with the outline attached to the wound base. There is large (67-100%) pink, pale granulation within the wound bed. There is no necrotic tissue within the wound bed. The periwound skin appearance exhibited: Localized Edema, Moist. The periwound skin appearance did not exhibit: Callus, Crepitus, Excoriation, Fluctuance, Friable, Induration, Rash, Scarring, Dry/Scaly, Maceration, Atrophie Blanche, Cyanosis, Ecchymosis, Hemosiderin Staining, Mottled, Pallor, Rubor, Erythema. Periwound temperature was noted as No [name], [name] ([id number]) Abnormality. 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 Skin Barriers/Peri-Wound Care: Wound #5 Right,Medial Lower Leg: Moisturizing lotion Primary Wound Dressing: Wound #5 Right,Medial Lower Leg: Promogran Other: - sorbact Secondary Dressing: Wound #5 Right,Medial Lower Leg: Gauze, ABD and Kerlix/Conform - carboflex Dressing Change Frequency: Wound #5 Right,Medial Lower Leg: Change dressing every week Follow-up Appointments: Wound #5 Right,Medial Lower Leg: Return Appointment in 1 week. Edema Control: Wound #5 Right,Medial Lower Leg: 4-Layer Compression System - Right Lower Extremity Follow-Up Appointments: [name], CARROLL ([id number]) A follow-up appointment should be scheduled. A Patient Clinical Summary of Care was provided to [place] Bilateral venous stasis with ulcers. Treat with silver collagen and compression Electronic Signature(s) Signed: 07/04/2015 2:02:18 PM By: [name] Sax MD Entered By: [name] on 07/04/2015 14:02:18 NICOLO, TOMKO ([id number]) -------------------------------------------------------------------------------- SuperBill Details Patient Name: [name] Date of Service: 07/04/2015 Medical Record Number: [id number] Patient Account Number: [id number] Date of Birth/Sex: [birth year] (79 y.o. 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 E66.01 Morbid (severe) obesity due to excess calories I89.0 Lymphedema, not elsewhere classified Facility Procedures CPT4: Description Modifier Quantity Code [id number] (Facility  Use Only) [phone] - APPLY MULTLAY COMPRS LWR RT 1 LEG Physician Procedures CPT4 Code Description: [id number] - WC PHYS LEVEL 2 - EST PT ICD-10 Description Diagnosis I87.311 Chronic venous hypertension (idiopathic) with ulcer of Modifier: right lower Quantity: 1 extremity Electronic Signature(s) Signed: 07/04/2015 2:02:50 PM By: [name] Sax MD Previous Signature: 07/04/2015 1:38:40 PM Version By: [name] Entered By: [name] on 07/04/2015 14:02:50

## 2015-07-11 ENCOUNTER — Ambulatory Visit: Payer: Medicare Other | Admitting: Surgery

## 2015-07-12 ENCOUNTER — Encounter: Payer: Medicare Other | Attending: Surgery | Admitting: Surgery

## 2015-07-12 DIAGNOSIS — L97313 Non-pressure chronic ulcer of right ankle with necrosis of muscle: Secondary | ICD-10-CM | POA: Insufficient documentation

## 2015-07-12 DIAGNOSIS — I87311 Chronic venous hypertension (idiopathic) with ulcer of right lower extremity: Secondary | ICD-10-CM | POA: Insufficient documentation

## 2015-07-12 DIAGNOSIS — I10 Essential (primary) hypertension: Secondary | ICD-10-CM | POA: Diagnosis not present

## 2015-07-12 DIAGNOSIS — G20 Parkinson's disease: Secondary | ICD-10-CM | POA: Diagnosis not present

## 2015-07-12 DIAGNOSIS — I890 Lymphedema, not elsewhere classified: Secondary | ICD-10-CM | POA: Insufficient documentation

## 2015-07-13 NOTE — Progress Notes (Signed)
[name], [name] ([id number]) Visit Report for 07/12/2015 Chief Complaint Document Details Patient Name: [name], [name] Date of Service: 07/12/2015 8:45 AM Medical Record Number: [id number] Patient Account Number: [id number] Date of Birth/Sex: 25-Aug-1935 (80 y.o. Male) Treating RN: Curtis Sites Primary Care Physician: Dorothey Baseman Other Clinician: Referring Physician: Dorothey Baseman Treating Physician/Extender: Rudene Re in Treatment: 25 Information Obtained from: Patient Chief Complaint Patient presents for treatment of an open ulcer due to venous insufficiency. The patient has had a open wound on his right medial ankle for at least 3 years and was last seen in the wound clinic in February 2015. He was lost to follow-up after that. Electronic Signature(s) Signed: 07/12/2015 9:51:38 AM By: Evlyn Kanner MD, FACS Entered By: Evlyn Kanner on 07/12/2015 09:51:37 [name], [name] ([id number]) -------------------------------------------------------------------------------- Cellular or Tissue Based Product Details Patient Name: [name], [name] Date of Service: 07/12/2015 8:45 AM Medical Record Number: [id number] Patient Account Number: [id number] Date of Birth/Sex: 03-Sep-1935 (80 y.o. Male) Treating RN: Curtis Sites Primary Care Physician: Dorothey Baseman Other Clinician: Referring Physician: Dorothey Baseman Treating Physician/Extender: Rudene Re in Treatment: 25 Cellular or Tissue Based Wound #5 Right,Medial Lower Leg Product Type Applied to: Performed By: Physician Evlyn Kanner, MD Cellular or Tissue Based Other Product Type: Time-Out Taken: Yes Location: trunk / arms / legs Wound Size (sq cm): 9.68 Product Size (sq cm): 54 Waste Size (sq cm): 44 Waste Reason: wound size Amount of Product Applied (sq cm): 10 Lot #: [id number] Expiration Date: 07/25/2015 Fenestrated: No Reconstituted: Yes Solution Type: Sodium Chloride Solution Amount:  2ml Lot #: [id number] Solution Expiration 11/03/2016 Date: Secured: Yes Secured With: Steri-Strips Dressing Applied: Yes Primary Dressing: mepitel one Procedural Pain: 0 Response to Treatment: Procedure was tolerated well Post Procedure Diagnosis Same as Pre-procedure Electronic Signature(s) Signed: 07/12/2015 9:48:44 AM By: Evlyn Kanner MD, FACS Entered By: Evlyn Kanner on 07/12/2015 09:48:44 [name] ([id number]) -------------------------------------------------------------------------------- HPI Details Patient Name: [name] Date of Service: 07/12/2015 8:45 AM Medical Record Number: [id number] Patient Account Number: [id number] Date of Birth/Sex: [birth year] (80 y.o. Male) Treating RN: Curtis Sites Primary Care Physician: Dorothey Baseman Other Clinician: Referring Physician: Dorothey Baseman Treating Physician/Extender: Rudene Re in Treatment: 25 History of Present Illness Location: right lower extremity ulceration Quality: Patient reports experiencing a dull pain to affected area(s). Severity: Patient states wound are getting worse. 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 Modifying Factors: Consults to this date include:has received treatment in the wound center in the past but stopped coming here since February 2015 Associated Signs and Symptoms: Patient reports having difficulty standing for long periods. HPI Description: 80 year old male who is known to have progressive weakness and has been in a nursing home for a while has had chronic lower extremity edema both legs and a large open wound on the right lower extremity which is has at least for about 3-4  years. The patient was seen in the wound clinic before and has been treated until February 2015 when he was lost to follow-up. Past medical history is significant for hypertension, gout, venous stasis ulcers, Parkinson's [name], constipation. He's not been a diabetic but his last hemoglobin A1c was  6 in March 2015. There is documentation that he's had endovenous ablation of bilateral varicose veins but we have no documentation about this and this may be done at least 4-5 years ago. 01/22/2015 -- he has his vascular test is scheduled for this afternoon. 01/29/2015 -- the patient's vascular test was canceled last week because he was unable to get onto the examining bed at [facility]. His Unna's boot was not applied either and the patient had a dressing placed by home health. Today when his dressing was removed we found a lot of maggots in his right lower extremity. 02/07/2015 -- Was seen in the vascular office by the PA and she noted that a bilateral venous duplex study did not show any DVT, SVT or reflux bilaterally. The patient was advised to continue with Unna's boot and would at some stage to require graduated compression stockings of the 20-30 mmHg variety. In the future lymphedema pumps would also be considered. 02/14/2015 -- his dressing is smelling quite a bit and there is a discoloration of the wound suggestive of an infection. Deep tissue cultures will be taken today. 02/21/2015 -- the culture is back and it has growth moderate growth of Proteus and gram-negative rods sensitive to sulfa, ampicillin, cephazolin, Zosyn. He is already on doxycycline and his wound is looking clean and hence we will continue with this. 02/28/2015 -- he's been doing fine still on his antibiotics and has no fresh issues. 03/07/2015 -- I was told that because he lives in a nursing home the Apligraf was denied by his insurance company. 03/14/2015 -- we are awaiting samples and a trial of Puraply to be tried  for his ulceration. 03/21/2015 -- his Apligraf was approved and he is here for his first application of Apligraf 03/28/2015 -- his wound has had quite a bit of secretion and needs to be changed and hence I will use the AADON, ROUTHIER. ([id number]) second application of Apligraf. 04/04/2015 -- he is here for a wound check but has a lot of secretions and hence his dressing needs to be taken down. 04/09/2015 -- he is here for his third application of Apligraf 04/18/2015 -- he is here for a wound check and though he does not have any overt infection he always has a foul odor to his leg. 04/25/2015 -- his wound has been doing much better and he has minimal drainage and the size is smaller. Of note he is on doxycycline. 05/02/2015 -- he is here for his fourth application of Apligraf 05/09/2015 -- he is here for a wound check and is bothered by a lot of odor from the wound. 05/16/2015 -- he has done very well, the odor and drainage is less with the wound dressing having been changed twice this week. He is here for his last application of Apligraf. 06/20/2015 -- an area superior to the previous wound has now opened up and this is a fairly superficial ulceration. 07/12/2015 -- he is here for his first application of Puraply. This is a sample and this is no Curator) Signed: 07/12/2015 10:10:40 AM By: Evlyn Kanner MD, FACS Previous Signature: 07/12/2015 9:52:34 AM Version By: Evlyn Kanner MD, FACS Entered By: Evlyn Kanner on 07/12/2015 10:10:40 [name], [name] ([id number]) -------------------------------------------------------------------------------- Physical Exam Details Patient Name: [name], [name] Date of Service: 07/12/2015 8:45 AM Medical Record Number: [id number] Patient Account Number: [id number] Date of Birth/Sex: 1935/07/16 (80 y.o. Male) Treating RN: Curtis Sites Primary Care Physician: Dorothey Baseman Other Clinician: Referring Physician: Terance Hart  DAVID Treating Physician/Extender: Rudene Re in Treatment: 25 Constitutional . Pulse regular. Respirations normal and unlabored. Afebrile. . Eyes Nonicteric. Reactive to light. Ears, Nose, Mouth, and Throat Lips, teeth, and gums WNL.Marland Kitchen Moist mucosa without lesions. Neck supple and nontender. No palpable supraclavicular or cervical adenopathy. Normal sized without goiter. Respiratory WNL. No retractions.. Cardiovascular Pedal Pulses WNL. No clubbing, cyanosis or edema. Lymphatic No adneopathy. No adenopathy. No adenopathy. Musculoskeletal Adexa without tenderness or enlargement.. Digits and nails w/o clubbing, cyanosis, infection, petechiae, ischemia, or inflammatory conditions.. Integumentary (Hair, Skin) No suspicious lesions. No crepitus or fluctuance. No peri-wound warmth or erythema. No masses.Marland Kitchen Psychiatric Judgement and insight Intact.. No evidence of depression, anxiety, or agitation.. Notes the wound is clean and after debriding it with an abrasive technique with saline we have got to wound bed ready for application of Puraply. Electronic Signature(s) Signed: 07/12/2015 9:53:28 AM By: Evlyn Kanner MD, FACS Entered By: Evlyn Kanner on 07/12/2015 09:53:27 [name], [name] ([id number]) -------------------------------------------------------------------------------- Physician Orders Details Patient Name: [name], [name] Date of Service: 07/12/2015 8:45 AM Medical Record Number: [id number] Patient Account Number: [id number] Date of Birth/Sex: [birth year] (80 y.o. Male) Treating RN: Huel Coventry Primary Care Physician: Dorothey Baseman Other Clinician: Referring Physician: Dorothey Baseman Treating Physician/Extender: Rudene Re in Treatment: 25 Verbal / Phone Orders: Yes Clinician: Huel Coventry Read Back and Verified: Yes Diagnosis Coding Wound Cleansing Wound #5 Right,Medial Lower Leg o Cleanse wound with mild soap and water o No tub bath. Skin  Barriers/Peri-Wound Care Wound #5 Right,Medial Lower Leg o Moisturizing lotion Secondary Dressing Wound #5 Right,Medial Lower Leg o ABD pad - Carboflex and ABD; gauze to bolster Dressing Change Frequency Wound #5 Right,Medial Lower Leg o Change dressing every week Follow-up Appointments Wound #5 Right,Medial Lower Leg o Return Appointment in 1 week. Edema Control Wound #5 Right,Medial Lower Leg o 4-Layer Compression System - Right Lower Extremity Notes Puraply application in clinic; including contact layer, fixation with steri strips, dry gauze and cover dressing. DO NOT CHANGE DRESSING or get dressing wet! Skin substitute applied 07/12/2015. Electronic Signature(s) Signed: 07/12/2015 5:05:49 PM By: Evlyn Kanner MD, FACS Signed: 07/12/2015 5:51:05 PM By: Elliot Gurney RN, BSN, Kim RN, BSN Entered By: Elliot Gurney, RN, BSN, Kim on 07/12/2015 09:50:12 [name], [name] ([id number]) [name], [name] ([id number]) -------------------------------------------------------------------------------- Problem List Details Patient Name: [name], [name] Date of Service: 07/12/2015 8:45 AM Medical Record Number: [id number] Patient Account Number: [id number] Date of Birth/Sex: 1936-04-21 (80 y.o. 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 Date of Service: 07/12/2015 8:45 AM Medical Record Number: [id number] Patient Account Number: [id number] Date of Birth/Sex: 1935-10-26 (80 y.o. Male) Treating RN: Curtis Sites Primary Care Physician: Dorothey Baseman Other Clinician: Referring Physician: Dorothey Baseman Treating Physician/Extender: Rudene Re in Treatment: 25 Subjective Chief Complaint Information obtained from Patient Patient presents for treatment of an open ulcer due to venous insufficiency. The patient has had  a open wound on his right medial ankle for at least 3 years and was last seen in the wound clinic in February 2015. He was lost to follow-up after that. History of Present Illness (HPI) The following HPI elements were documented for the patient's wound: Location: right lower extremity ulceration Quality: Patient reports experiencing a dull pain to affected area(s). Severity: Patient states wound are getting worse. 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 Modifying Factors: Consults to this date include:has received treatment in the wound center in the past but stopped coming here since February 2015 Associated Signs and Symptoms: Patient reports having difficulty standing for long periods. 80 year old male who is known to have progressive weakness and has been in a nursing home for a while has had chronic lower extremity edema both legs and a large open wound on the right lower extremity which is has at least  for about 3-4 years. The patient was seen in the wound clinic before and has been treated until February 2015 when he was lost to follow-up. Past medical history is significant for hypertension, gout, venous stasis ulcers, Parkinson's [name], constipation. He's not been a diabetic but his last hemoglobin A1c was 6 in March 2015. There is documentation that he's had endovenous ablation of bilateral varicose veins but we have no documentation about this and this may be done at least 4-5 years ago. 01/22/2015 -- he has his vascular test is scheduled for this afternoon. 01/29/2015 -- the patient's vascular test was canceled last week because he was unable to get onto the examining bed at [facility]. His Unna's boot was not applied either and the patient had a dressing placed by home health. Today when his dressing was removed we found a lot of maggots in his right lower extremity. 02/07/2015 -- Was seen in the vascular office by the PA and she noted that a bilateral venous duplex study did not show any DVT, SVT or reflux bilaterally. The patient was advised to continue with Unna's boot and would at some stage to require graduated compression stockings of the 20-30 mmHg variety. In the future lymphedema pumps would also be considered. 02/14/2015 -- his dressing is smelling quite a bit and there is a discoloration of the wound suggestive of an CARLYLE, MCELRATH. ([id number]) infection. Deep tissue cultures will be taken today. 02/21/2015 -- the culture is back and it has growth moderate growth of Proteus and gram-negative rods sensitive to sulfa, ampicillin, cephazolin, Zosyn. He is already on doxycycline and his wound is looking clean and hence we will continue with this. 02/28/2015 -- he's been doing fine still on his antibiotics and has no fresh issues. 03/07/2015 -- I was told that because he lives in a nursing home the Apligraf was denied by his insurance company. 03/14/2015 -- we are awaiting  samples and a trial of Puraply to be tried for his ulceration. 03/21/2015 -- his Apligraf was approved and he is here for his first application of Apligraf 03/28/2015 -- his wound has had quite a bit of secretion and needs to be changed and hence I will use the second application of Apligraf. 04/04/2015 -- he is here for a wound  check but has a lot of secretions and hence his dressing needs to be taken down. 04/09/2015 -- he is here for his third application of Apligraf 04/18/2015 -- he is here for a wound check and though he does not have any overt infection he always has a foul odor to his leg. 04/25/2015 -- his wound has been doing much better and he has minimal drainage and the size is smaller. Of note he is on doxycycline. 05/02/2015 -- he is here for his fourth application of Apligraf 05/09/2015 -- he is here for a wound check and is bothered by a lot of odor from the wound. 05/16/2015 -- he has done very well, the odor and drainage is less with the wound dressing having been changed twice this week. He is here for his last application of Apligraf. 06/20/2015 -- an area superior to the previous wound has now opened up and this is a fairly superficial ulceration. 07/12/2015 -- he is here for his first application of Puraply. This is a sample and this is no charge Objective Constitutional Pulse regular. Respirations normal and unlabored. Afebrile. Vitals Time Taken: 9:11 AM, Height: 68 in, Temperature: 98.1 F, Pulse: 66 bpm, Respiratory Rate: 18 breaths/min, Blood Pressure: 130/56 mmHg. Eyes Nonicteric. Reactive to light. Ears, Nose, Mouth, and Throat Lips, teeth, and gums WNL.Marland Kitchen Moist mucosa without lesions. Neck [name], [name] ([id number]) supple and nontender. No palpable supraclavicular or cervical adenopathy. Normal sized without goiter. Respiratory WNL. No retractions.. Cardiovascular Pedal Pulses WNL. No clubbing, cyanosis or edema. Lymphatic No adneopathy. No  adenopathy. No adenopathy. Musculoskeletal Adexa without tenderness or enlargement.. Digits and nails w/o clubbing, cyanosis, infection, petechiae, ischemia, or inflammatory conditions.Marland Kitchen Psychiatric Judgement and insight Intact.. No evidence of depression, anxiety, or agitation.. General Notes: the wound is clean and after debriding it with an abrasive technique with saline we have got to wound bed ready for application of Puraply. Integumentary (Hair, Skin) No suspicious lesions. No crepitus or fluctuance. No peri-wound warmth or erythema. No masses.. Wound #5 status is Open. Original cause of wound was Gradually Appeared. The wound is located on the Right,Medial Lower Leg. The wound measures 4.4cm length x 2.2cm width x 0.1cm depth; 7.603cm^2 area and 0.76cm^3 volume. The wound is limited to skin breakdown. There is no tunneling or undermining noted. There is a medium amount of serosanguineous drainage noted. The wound margin is distinct with the outline attached to the wound base. There is large (67-100%) pink, pale granulation within the wound bed. There is no necrotic tissue within the wound bed. The periwound skin appearance exhibited: Localized Edema, Moist. The periwound skin appearance did not exhibit: Callus, Crepitus, Excoriation, Fluctuance, Friable, Induration, Rash, Scarring, Dry/Scaly, Maceration, Atrophie Blanche, Cyanosis, Ecchymosis, Hemosiderin Staining, Mottled, Pallor, Rubor, Erythema. Periwound temperature was noted as No Abnormality. 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 He has had his first application of Puraply  applied and we will then bolstered in place and apply a 4-layer Profore. He will be back to see as next week. Procedures Wound #5 Wound #5 is a Venous Leg Ulcer located on the Right,Medial Lower Leg. A skin graft procedure using a bioengineered skin substitute/cellular or tissue based product was performed  by Evlyn Kanner, MD. Other was applied and secured with Steri-Strips. 10 sq cm of product was utilized and 44 sq cm was wasted due to wound size. Post Application, mepitel one was applied. A Time Out was conducted prior to the start of the procedure. The procedure was tolerated well with a pain level of 0 throughout. Post procedure Diagnosis Wound #5: Same as Pre-Procedure . Plan Wound Cleansing: Wound #5 Right,Medial Lower Leg: Cleanse wound with mild soap and water No tub bath. Skin Barriers/Peri-Wound Care: Wound #5 Right,Medial Lower Leg: Moisturizing lotion Secondary Dressing: Wound #5 Right,Medial Lower Leg: ABD pad - Carboflex and ABD; gauze to bolster Dressing Change Frequency: Wound #5 Right,Medial Lower Leg: Change dressing every week Follow-up Appointments: Wound #5 Right,Medial Lower Leg: Return Appointment in 1 week. Edema Control: Wound #5 Right,Medial Lower Leg: 4-Layer Compression System - Right Lower Extremity General Notes: Puraply application in clinic; including contact layer, fixation with steri strips, dry gauze and cover dressing. DO NOT CHANGE DRESSING or get dressing wet! Skin substitute applied 07/12/2015. ZIMIR, KITTLESON ([id number]) The patient has been doing fine and his wound is clean. He has had his first application of Puraply applied and we will then bolstered in place and apply a 4-layer Profore. He will be back to see as next week. Electronic Signature(s) Signed: 07/12/2015 5:01:13 PM By: Evlyn Kanner MD, FACS Previous Signature: 07/12/2015 5:01:03 PM Version By: Evlyn Kanner MD, FACS Previous Signature: 07/12/2015 9:54:25 AM Version By:  Evlyn Kanner MD, FACS Entered By: Evlyn Kanner on 07/12/2015 17:01:13 MUNEEB, VERAS ([id number]) -------------------------------------------------------------------------------- SuperBill Details Patient Name: JAIRE, PINKHAM. Date of Service: 07/12/2015 Medical Record Number: [id number] Patient Account Number: [id number] Date of Birth/Sex: 04/13/1936 (80 y.o. 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 E66.01 Morbid (severe) obesity due to excess calories I89.0 Lymphedema, not elsewhere classified Facility Procedures CPT4 Code Description: [id number] - SKIN SUB GRAFT TRNK/ARM/LEG ICD-10 Description Diagnosis I87.311 Chronic venous hypertension (idiopathic) with ulcer of 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 E66.01 Morbid (severe) obesity  due to excess calories I89.0 Lymphedema, not elsewhere classified Modifier: right lower s of muscle Quantity: 1 extremity Physician Procedures CPT4 Code Description: [id number] 15271 - WC PHYS SKIN SUB GRAFT TRNK/ARM/LEG ICD-10 Description Diagnosis I87.311 Chronic venous hypertension (idiopathic) with ulcer of 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s E66.01 Morbid (severe)  obesity due to excess calories I89.0 Lymphedema, not elsewhere classified Modifier: right lower of muscle Quantity: 1 extremity Electronic Signature(s) Signed: 07/12/2015 10:10:16 AM By: Evlyn Kanner MD, FACS Entered By: Evlyn Kanner on 07/12/2015 10:10:15

## 2015-07-13 NOTE — Progress Notes (Signed)
MCKINNON, GLICK ([id number]) Visit Report for 07/12/2015 Arrival Information Details Patient Name: [name], [name] Date of Service: 07/12/2015 8:45 AM Medical Record Number: [id number] Patient Account Number: [id number] Date of Birth/Sex: [birth year] (80 y.o. Male) Treating RN: Curtis Sites Primary Care Physician: Dorothey Baseman Other Clinician: Referring Physician: Terance Hart, DAVID Treating Physician/Extender: Rudene Re in Treatment: 25 Visit Information History Since Last Visit Added or deleted any medications: No Patient Arrived: Wheel Chair Any new allergies or adverse reactions: No Arrival Time: 09:10 Had a fall or experienced change in No activities of daily living that may affect Accompanied By: self risk of falls: Transfer Assistance: None Signs or symptoms of abuse/neglect since last No Patient Identification Verified: Yes visito Secondary Verification Process Yes Hospitalized since last visit: No Completed: Pain Present Now: No Patient Requires Transmission-Based No Precautions: Patient Has Alerts: No Electronic Signature(s) Signed: 07/12/2015 5:42:17 PM By: Curtis Sites Entered By: Curtis Sites on 07/12/2015 09:10:59 [name] ([id number]) -------------------------------------------------------------------------------- Encounter Discharge Information Details Patient Name: [name] Date of Service: 07/12/2015 8:45 AM Medical Record Number: [id number] Patient Account Number: [id number] Date of Birth/Sex: 11-16-1935 (80 y.o. Male) Treating RN: Curtis Sites Primary Care Physician: Dorothey Baseman Other Clinician: Referring Physician: Dorothey Baseman Treating Physician/Extender: Rudene Re in Treatment: 25 Encounter Discharge Information Items Discharge Pain Level: 0 Discharge Condition: Stable Ambulatory Status: Wheelchair Discharge Destination: Nursing Home Transportation: Private Auto Accompanied By:  self Schedule Follow-up Appointment: Yes Medication Reconciliation completed No and provided to Patient/Care Annalea Alguire: Provided on Clinical Summary of Care: 07/12/2015 Form Type Recipient Paper Patient [facility] Electronic Signature(s) Signed: 07/12/2015 11:57:50 AM By: Curtis Sites Previous Signature: 07/12/2015 10:05:38 AM Version By: Gwenlyn Perking Entered By: Curtis Sites on 07/12/2015 11:57:50 [name], [name] ([id number]) -------------------------------------------------------------------------------- Lower Extremity Assessment Details Patient Name: [name] Date of Service: 07/12/2015 8:45 AM Medical Record Number: [id number] Patient Account Number: [id number] Date of Birth/Sex: 06/05/1936 (80 y.o. Male) Treating RN: Curtis Sites Primary Care Physician: Terance Hart, DAVID Other Clinician: Referring Physician: Terance Hart, DAVID Treating Physician/Extender: Rudene Re in Treatment: 25 Edema Assessment Assessed: [Left: No] [Right: No] Edema: [Left: Ye] [Right: s] Calf Left: Right: Point of Measurement: 40 cm From Medial Instep cm 38 cm Ankle Left: Right: Point of Measurement: 8 cm From Medial Instep cm 28.3 cm Vascular Assessment Pulses: Posterior Tibial Dorsalis Pedis Palpable: [Right:Yes] Extremity colors, hair growth, and conditions: Extremity Color: [Right:Hyperpigmented] Hair Growth on Extremity: [Right:No] Temperature of Extremity: [Right:Warm] Capillary Refill: [Right:< 3 seconds] Toe Nail Assessment Left: Right: Thick: Yes Discolored: Yes Deformed: No Improper Length and Hygiene: No Electronic Signature(s) Signed: 07/12/2015 5:42:17 PM By: Curtis Sites Entered By: Curtis Sites on 07/12/2015 09:18:14 [name] ([id number]) -------------------------------------------------------------------------------- Multi Wound Chart Details Patient Name: [name] Date of Service: 07/12/2015 8:45 AM Medical Record Number:  [id number] Patient Account Number: [id number] Date of Birth/Sex: [birth year] (80 y.o. Male) Treating RN: Huel Coventry Primary Care Physician: Dorothey Baseman Other Clinician: Referring Physician: Terance Hart, DAVID Treating Physician/Extender: Rudene Re in Treatment: 25 Vital Signs Height(in): 68 Pulse(bpm): 66 Weight(lbs): Blood Pressure 130/56 (mmHg): Body Mass Index(BMI): Temperature(F): 98.1 Respiratory Rate 18 (breaths/min): Photos: [5:No Photos] [N/A:N/A] Wound Location: [5:Right Lower Leg - Medial] [N/A:N/A] Wounding Event: [5:Gradually Appeared] [N/A:N/A] Primary Etiology: [5:Venous Leg Ulcer] [N/A:N/A] Comorbid History: [5:Cataracts, Anemia, Hypertension, Osteoarthritis] [N/A:N/A] Date Acquired: [5:11/13/2014] [N/A:N/A] Weeks of Treatment: [5:25] [N/A:N/A] Wound Status: [5:Open] [N/A:N/A] Measurements L x W x D 4.4x2.2x0.1 [N/A:N/A] (cm) Area (cm) : [5:7.603] [N/A:N/A] Volume (cm) : [5:0.76] [N/A:N/A] %  Reduction in Area: [5:92.60%] [N/A:N/A] % Reduction in Volume: 97.50% [N/A:N/A] Classification: [5:Full Thickness Without Exposed Support Structures] [N/A:N/A] Exudate Amount: [5:Medium] [N/A:N/A] Exudate Type: [5:Serosanguineous] [N/A:N/A] Exudate Color: [5:red, brown] [N/A:N/A] Foul Odor After [5:Yes] [N/A:N/A] Cleansing: Odor Anticipated Due to No [N/A:N/A] Product Use: Wound Margin: [5:Distinct, outline attached] [N/A:N/A] Granulation Amount: [5:Large (67-100%)] [N/A:N/A] Granulation Quality: [5:Pink, Pale] [N/A:N/A] Necrotic Amount: [5:None Present (0%)] [N/A:N/A] Exposed Structures: Fascia: No N/A N/A Fat: No Tendon: No Muscle: No Joint: No Bone: No Limited to Skin Breakdown Epithelialization: Medium (34-66%) N/A N/A Periwound Skin Texture: Edema: Yes N/A N/A Excoriation: No Induration: No Callus: No Crepitus: No Fluctuance: No Friable: No Rash: No Scarring: No Periwound Skin Moist: Yes N/A N/A Moisture: Maceration:  No Dry/Scaly: No Periwound Skin Color: Atrophie Blanche: No N/A N/A Cyanosis: No Ecchymosis: No Erythema: No Hemosiderin Staining: No Mottled: No Pallor: No Rubor: No Temperature: No Abnormality N/A N/A Tenderness on No N/A N/A Palpation: Wound Preparation: Ulcer Cleansing: Other: N/A N/A soap and water Topical Anesthetic Applied: None Treatment Notes Electronic Signature(s) Signed: 07/12/2015 5:51:05 PM By: Elliot Gurney, RN, BSN, Kim RN, BSN Entered By: Elliot Gurney, RN, BSN, Kim on 07/12/2015 09:33:45 [name], [name] ([id number]) -------------------------------------------------------------------------------- Multi-Disciplinary Care Plan Details Patient Name: [name], [name] Date of Service: 07/12/2015 8:45 AM Medical Record Number: [id number] Patient Account Number: [id number] Date of Birth/Sex: [birth year] (80 y.o. Male) Treating RN: Huel Coventry Primary Care Physician: Dorothey Baseman Other Clinician: Referring Physician: Dorothey Baseman Treating Physician/Extender: Rudene Re in Treatment: 25 Active Inactive Orientation to the Wound Care Program Nursing Diagnoses: Knowledge deficit related to the wound healing center program Goals: Patient/caregiver will verbalize understanding of the Wound Healing Center Program Date Initiated: 01/15/2015 Goal Status: Active Interventions: Provide education on orientation to the wound center Notes: Venous Leg Ulcer Nursing Diagnoses: Knowledge deficit related to disease process and management Potential for venous Insuffiency (use before diagnosis confirmed) Goals: Patient will maintain optimal edema control Date Initiated: 01/15/2015 Goal Status: Active Patient/caregiver will verbalize understanding of disease process and disease management Date Initiated: 01/15/2015 Goal Status: Active Verify adequate tissue perfusion prior to therapeutic compression application Date Initiated: 01/15/2015 Goal Status:  Active Interventions: Assess peripheral edema status every visit. Compression as ordered Provide education on venous insufficiency EAMON, TANTILLO ([id number]) Treatment Activities: Test ordered outside of clinic : 07/12/2015 Therapeutic compression applied : 07/12/2015 Venous Duplex Doppler : 07/12/2015 Notes: Wound/Skin Impairment Nursing Diagnoses: Impaired tissue integrity Knowledge deficit related to ulceration/compromised skin integrity Goals: Patient/caregiver will verbalize understanding of skin care regimen Date Initiated: 01/15/2015 Goal Status: Active Ulcer/skin breakdown will have a volume reduction of 30% by week 4 Date Initiated: 01/15/2015 Goal Status: Active Ulcer/skin breakdown will have a volume reduction of 50% by week 8 Date Initiated: 01/15/2015 Goal Status: Active Ulcer/skin breakdown will have a volume reduction of 80% by week 12 Date Initiated: 01/15/2015 Goal Status: Active Ulcer/skin breakdown will heal within 14 weeks Date Initiated: 01/15/2015 Goal Status: Active Interventions: Assess patient/caregiver ability to perform ulcer/skin care regimen upon admission and as needed Assess ulceration(s) every visit Provide education on ulcer and skin care Notes: Electronic Signature(s) Signed: 07/12/2015 5:51:05 PM By: Elliot Gurney, RN, BSN, Kim RN, BSN Entered By: Elliot Gurney, RN, BSN, Kim on 07/12/2015 09:33:27 [name] ([id number]) -------------------------------------------------------------------------------- Patient/Caregiver Education Details Patient Name: [name], [name] Date of Service: 07/12/2015 8:45 AM Medical Record Number: [id number] Patient Account Number: [id number] Date of Birth/Gender: Aug 01, 1935 (80 y.o. Male) Treating RN: Curtis Sites Primary Care Physician: Dorothey Baseman Other Clinician: Referring Physician:  Terance Hart, DAVID Treating Physician/Extender: Rudene Re in Treatment: 25 Education Assessment Education Provided  To: Patient Education Topics Provided Nutrition: Handouts: Nutrition Methods: Explain/Verbal Responses: State content correctly Electronic Signature(s) Signed: 07/12/2015 11:58:06 AM By: Curtis Sites Entered By: Curtis Sites on 07/12/2015 11:58:06 [name] ([id number]) -------------------------------------------------------------------------------- Wound Assessment Details Patient Name: [name], [name] Date of Service: 07/12/2015 8:45 AM Medical Record Number: [id number] Patient Account Number: [id number] Date of Birth/Sex: 1936-03-23 (80 y.o. Male) Treating RN: Curtis Sites Primary Care Physician: Dorothey Baseman Other Clinician: Referring Physician: Terance Hart, DAVID Treating Physician/Extender: Rudene Re in Treatment: 25 Wound Status Wound Number: 5 Primary Venous Leg Ulcer Etiology: Wound Location: Right Lower Leg - Medial Wound Status: Open Wounding Event: Gradually Appeared Comorbid Cataracts, Anemia, Hypertension, Date Acquired: 11/13/2014 History: Osteoarthritis Weeks Of Treatment: 25 Clustered Wound: No Photos Photo Uploaded By: Curtis Sites on 07/12/2015 16:43:36 Wound Measurements Length: (cm) 4.4 Width: (cm) 2.2 Depth: (cm) 0.1 Area: (cm) 7.603 Volume: (cm) 0.76 % Reduction in Area: 92.6% % Reduction in Volume: 97.5% Epithelialization: Medium (34-66%) Tunneling: No Undermining: No Wound Description Full Thickness Without Exposed Classification: Support Structures Wound Margin: Distinct, outline attached Exudate Medium Amount: Exudate Type: Serosanguineous Exudate Color: red, brown Foul Odor After Cleansing: Yes Due to Product Use: No Wound Bed Granulation Amount: Large (67-100%) Exposed Structure Granulation Quality: Pink, Pale Fascia Exposed: No Necrotic Amount: None Present (0%) Fat Layer Exposed: No [name], [name] ([id number]) Tendon Exposed: No Muscle Exposed: No Joint Exposed: No Bone Exposed:  No Limited to Skin Breakdown Periwound Skin Texture Texture Color No Abnormalities Noted: No No Abnormalities Noted: No Callus: No Atrophie Blanche: No Crepitus: No Cyanosis: No Excoriation: No Ecchymosis: No Fluctuance: No Erythema: No Friable: No Hemosiderin Staining: No Induration: No Mottled: No Localized Edema: Yes Pallor: No Rash: No Rubor: No Scarring: No Temperature / Pain Moisture Temperature: No Abnormality No Abnormalities Noted: No Dry / Scaly: No Maceration: No Moist: Yes Wound Preparation Ulcer Cleansing: Other: soap and water, Topical Anesthetic Applied: None Treatment Notes Wound #5 (Right, Medial Lower Leg) 1. Cleansed with: Cleanse wound with antibacterial soap and water 3. Peri-wound Care: Skin Prep 4. Dressing Applied: Mepitel Other dressing (specify in notes) 5. Secondary Dressing Applied ABD Pad 7. Secured with 4-Layer Compression System - Right Lower Extremity Notes puraply applied by Dr Meyer Russel today Electronic Signature(s) Signed: 07/12/2015 5:42:17 PM By: Ebbie Ridge ([id number]) Entered By: Curtis Sites on 07/12/2015 09:21:48 [name], [name] ([id number]) -------------------------------------------------------------------------------- Vitals Details Patient Name: [name], [name] Date of Service: 07/12/2015 8:45 AM Medical Record Number: [id number] Patient Account Number: [id number] Date of Birth/Sex: Dec 29, 1935 (80 y.o. Male) Treating RN: Curtis Sites Primary Care Physician: Dorothey Baseman Other Clinician: Referring Physician: Terance Hart, DAVID Treating Physician/Extender: Rudene Re in Treatment: 25 Vital Signs Time Taken: 09:11 Temperature (F): 98.1 Height (in): 68 Pulse (bpm): 66 Respiratory Rate (breaths/min): 18 Blood Pressure (mmHg): 130/56 Reference Range: 80 - 120 mg / dl Electronic Signature(s) Signed: 07/12/2015 5:42:17 PM By: Curtis Sites Entered By: Curtis Sites on  07/12/2015 09:11:20

## 2015-07-18 ENCOUNTER — Encounter: Payer: Medicare Other | Admitting: Surgery

## 2015-07-18 DIAGNOSIS — I87311 Chronic venous hypertension (idiopathic) with ulcer of right lower extremity: Secondary | ICD-10-CM | POA: Diagnosis not present

## 2015-07-19 NOTE — Progress Notes (Signed)
[name] ([id number]) Visit Report for 07/18/2015 Arrival Information Details Patient Name: [name], [name] Date of Service: 07/18/2015 3:30 PM Medical Record Number: [id number] Patient Account Number: [id number] Date of Birth/Sex: 1936-03-24 (80 y.o. Male) Treating RN: [name] Mealy, RN, BSN, [place] [name] Primary Care Physician: [name], [name] Other Clinician: Referring Physician: Terance [name], [name] Treating Physician/Extender: [name] in Treatment: 26 Visit Information History Since Last Visit All ordered tests and consults were completed: No Patient Arrived: Wheel Chair Added or deleted any medications: No Arrival Time: 15:32 Any new allergies or adverse reactions: No Accompanied By: self Had a fall or experienced change in No activities of daily living that may affect Transfer Assistance: None risk of falls: Patient Identification Verified: Yes Signs or symptoms of abuse/neglect since last No Secondary Verification Process Yes visito Completed: Hospitalized since last visit: No Patient Requires Transmission-Based No Has Dressing in Place as Prescribed: Yes Precautions: Has Compression in Place as Prescribed: Yes Patient Has Alerts: No Pain Present Now: No Electronic Signature(s) Signed: 07/18/2015 5:20:37 PM By: [name] BSN, RN Entered By: [name] on 07/18/2015 15:32:46 [name] ([id number]) -------------------------------------------------------------------------------- Encounter Discharge Information Details Patient Name: [name] Date of Service: 07/18/2015 3:30 PM Medical Record Number: [id number] Patient Account Number: [id number] Date of Birth/Sex: [birth year] (80 y.o. Male) Treating RN: Leonard Downing Primary Care Physician: [name] Other Clinician: Referring Physician: Dorothey [name] Treating Physician/Extender: [name] in Treatment: 71 Encounter Discharge Information Items Facility  Notification Discharge Pain Level: 0 Facility Type: Skilled Nursing Facility Discharge Condition: Stable Orders Sent: Yes Ambulatory Status: Wheelchair Discharge Destination: Nursing Home Transportation: Private Auto Accompanied By: self Schedule Follow-up Appointment: Yes Medication Reconciliation completed and provided to Patient/Care Yes Amaiah [name]: Provided on Clinical Summary of Care: 07/18/2015 Form Type Recipient Paper Patient [facility] Electronic Signature(s) Signed: 07/18/2015 4:39:39 PM By: [name] Previous Signature: 07/18/2015 4:10:03 PM Version By: [name] Previous Signature: 07/18/2015 4:09:49 PM Version By: [name] Entered By: [name] Starch RN, [name] on 07/18/2015 16:12:35 [name] ([id number]) -------------------------------------------------------------------------------- Lower Extremity Assessment Details Patient Name: [name] Date of Service: 07/18/2015 3:30 PM Medical Record Number: [id number] Patient Account Number: [id number] Date of Birth/Sex: 06/01/1936 (80 y.o. Male) Treating RN: [name] Mealy, RN, BSN, [name] Primary Care Physician: [name], [name] Other Clinician: Referring Physician: Terance [name], [name] Treating Physician/Extender: [name] in Treatment: 26 Edema Assessment Assessed: [Left: No] [Right: No] E[Left: dema] [Right: :] Calf Left: Right: Point of Measurement: 40 cm From Medial Instep cm 38 cm Ankle Left: Right: Point of Measurement: 12 cm From Medial Instep cm 26 cm Vascular Assessment Pulses: Posterior Tibial Dorsalis Pedis Palpable: [Right:Yes] Extremity colors, [name] growth, and conditions: Extremity Color: [Right:Mottled] [name] Growth on Extremity: [Right:No] Temperature of Extremity: [Right:Warm] Capillary Refill: [Right:< 3 seconds] Toe Nail Assessment Left: Right: Thick: Yes Discolored: No Deformed: No Improper Length and Hygiene: No Electronic Signature(s) Signed: 07/18/2015 5:20:37 PM  By: [name] BSN, RN Entered By: [name] on 07/18/2015 15:45:35 [name] ([id number]) -------------------------------------------------------------------------------- Multi Wound Chart Details Patient Name: [name] Date of Service: 07/18/2015 3:30 PM Medical Record Number: [id number] Patient Account Number: [id number] Date of Birth/Sex: 07/31/1935 (80 y.o. Male) Treating RN: [name] Mealy, RN, BSN, [place] [name] Primary Care Physician: [name], [name] Other Clinician: Referring Physician: Terance [name], [name] Treating Physician/Extender: [name] in Treatment: 26 Vital Signs Height(in): 68 Pulse(bpm): 78 Weight(lbs): Blood Pressure 186/86 (mmHg): Body Mass Index(BMI): Temperature(F): 98.6 Respiratory Rate (breaths/min): Photos: [5:No Photos] [N/A:N/A] Wound Location: [5:Right Lower Leg - Medial] [N/A:N/A]  Wounding Event: [5:Gradually Appeared] [N/A:N/A] Primary Etiology: [5:Venous Leg Ulcer] [N/A:N/A] Comorbid History: [5:Cataracts, Anemia, Hypertension, Osteoarthritis] [N/A:N/A] Date Acquired: [5:11/13/2014] [N/A:N/A] Weeks of Treatment: [5:26] [N/A:N/A] Wound Status: [5:Open] [N/A:N/A] Measurements L x W x D 4.5x2x0.2 [N/A:N/A] (cm) Area (cm) : [5:7.069] [N/A:N/A] Volume (cm) : [5:1.414] [N/A:N/A] % Reduction in Area: [5:93.10%] [N/A:N/A] % Reduction in Volume: 95.40% [N/A:N/A] Classification: [5:Full Thickness Without Exposed Support Structures] [N/A:N/A] Exudate Amount: [5:Medium] [N/A:N/A] Exudate Type: [5:Serosanguineous] [N/A:N/A] Exudate Color: [5:red, brown] [N/A:N/A] Wound Margin: [5:Distinct, outline attached] [N/A:N/A] Granulation Amount: [5:Large (67-100%)] [N/A:N/A] Granulation Quality: [5:Pink, Pale] [N/A:N/A] Necrotic Amount: [5:Small (1-33%)] [N/A:N/A] Exposed Structures: [5:Fascia: No Fat: No Tendon: No Muscle: No] [N/A:N/A] Joint: No Bone: No Limited to Skin Breakdown Epithelialization: Medium (34-66%) N/A N/A Periwound Skin  Texture: Edema: No N/A N/A Excoriation: No Induration: No Callus: No Crepitus: No Fluctuance: No Friable: No Rash: No Scarring: No Periwound Skin Moist: Yes N/A N/A Moisture: Maceration: No Dry/Scaly: No Periwound Skin Color: Atrophie Blanche: No N/A N/A Cyanosis: No Ecchymosis: No Erythema: No Hemosiderin Staining: No Mottled: No Pallor: No Rubor: No Temperature: No Abnormality N/A N/A Tenderness on No N/A N/A Palpation: Wound Preparation: Ulcer Cleansing: Other: N/A N/A soap and water Topical Anesthetic Applied: None, Other: lidocaine 4% Treatment Notes Electronic Signature(s) Signed: 07/18/2015 5:20:37 PM By: [name] BSN, RN Entered By: [name] on 07/18/2015 16:01:11 [name] ([id number]) -------------------------------------------------------------------------------- Multi-Disciplinary Care Plan Details Patient Name: [name], [name] Date of Service: 07/18/2015 3:30 PM Medical Record Number: [id number] Patient Account Number: [id number] Date of Birth/Sex: 11/02/1935 (80 y.o. Male) Treating RN: [name] Mealy, RN, BSN, [place] [name] Primary Care Physician: [name], [name] Other Clinician: Referring Physician: Terance [name], [name] Treating Physician/Extender: [name] in Treatment: 61 Active Inactive Orientation to the Wound Care Program Nursing Diagnoses: Knowledge deficit related to the wound healing center program Goals: Patient/caregiver will verbalize understanding of the Wound Healing Center Program Date Initiated: 01/15/2015 Goal Status: Active Interventions: Provide education on orientation to the wound center Notes: Venous Leg Ulcer Nursing Diagnoses: Knowledge deficit related to disease process and management Potential for venous Insuffiency (use before diagnosis confirmed) Goals: Patient will maintain optimal edema control Date Initiated: 01/15/2015 Goal Status: Active Patient/caregiver will verbalize understanding of disease process  and disease management Date Initiated: 01/15/2015 Goal Status: Active Verify adequate tissue perfusion prior to therapeutic compression application Date Initiated: 01/15/2015 Goal Status: Active Interventions: Assess peripheral edema status every visit. Compression as ordered Provide education on venous insufficiency [name], [name] ([id number]) Treatment Activities: Test ordered outside of clinic : 07/18/2015 Therapeutic compression applied : 07/18/2015 Venous Duplex Doppler : 07/18/2015 Notes: Wound/Skin Impairment Nursing Diagnoses: Impaired tissue integrity Knowledge deficit related to ulceration/compromised skin integrity Goals: Patient/caregiver will verbalize understanding of skin care regimen Date Initiated: 01/15/2015 Goal Status: Active Ulcer/skin breakdown will have a volume reduction of 30% by week 4 Date Initiated: 01/15/2015 Goal Status: Active Ulcer/skin breakdown will have a volume reduction of 50% by week 8 Date Initiated: 01/15/2015 Goal Status: Active Ulcer/skin breakdown will have a volume reduction of 80% by week 12 Date Initiated: 01/15/2015 Goal Status: Active Ulcer/skin breakdown will heal within 14 weeks Date Initiated: 01/15/2015 Goal Status: Active Interventions: Assess patient/caregiver ability to perform ulcer/skin care regimen upon admission and as needed Assess ulceration(s) every visit Provide education on ulcer and skin care Notes: Electronic Signature(s) Signed: 07/18/2015 5:20:37 PM By: [name] BSN, RN Entered By: [name] on 07/18/2015 16:00:54 [name] ([id number]) -------------------------------------------------------------------------------- Pain Assessment Details Patient Name: [name] Date of  Service: 07/18/2015 3:30 PM Medical Record Number: [id number] Patient Account Number: [id number] Date of Birth/Sex: 12-Apr-1936 (80 y.o. Male) Treating RN: [name] Mealy, RN, BSN, [place] [name] Primary Care Physician: [name],  [name] Other Clinician: Referring Physician: Dorothey [name] Treating Physician/Extender: [name] in Treatment: 26 Active Problems Location of Pain Severity and Description of Pain Patient Has Paino No Site Locations Rate the pain. Current Pain Level: 0 Pain Management and Medication Current Pain Management: Electronic Signature(s) Signed: 07/18/2015 5:20:37 PM By: [name] BSN, RN Entered By: [name] on 07/18/2015 15:32:54 [name] ([id number]) -------------------------------------------------------------------------------- Patient/Caregiver Education Details Patient Name: [name] Date of Service: 07/18/2015 3:30 PM Medical Record Number: [id number] Patient Account Number: [id number] Date of Birth/Gender: 06-07-1936 (80 y.o. Male) Treating RN: Leonard Downing Primary Care Physician: [name] Other Clinician: Referring Physician: Dorothey [name] Treating Physician/Extender: [name] in Treatment: 90 Education Assessment Education Provided To: Patient Education Topics Provided Wound/Skin Impairment: Handouts: Caring for Your Ulcer, Skin Care Do's and Dont's Methods: Explain/Verbal Responses: State content correctly Electronic Signature(s) Signed: 07/18/2015 4:39:39 PM By: [name] Starch, RN, [name] Entered By: [name] Starch RN, [name] on 07/18/2015 15:48:24 [name], [name] ([id number]) -------------------------------------------------------------------------------- Wound Assessment Details Patient Name: [name], [name] Date of Service: 07/18/2015 3:30 PM Medical Record Number: [id number] Patient Account Number: [id number] Date of Birth/Sex: 1936-05-03 (80 y.o. Male) Treating RN: [name] Mealy, RN, BSN, [place] Primary Care Physician: [name], [name] Other Clinician: Referring Physician: Terance [name], [name] Treating Physician/Extender: [name] in Treatment: 26 Wound Status Wound Number: 5 Primary Venous Leg  Ulcer Etiology: Wound Location: Right Lower Leg - Medial Wound Status: Open Wounding Event: Gradually Appeared Comorbid Cataracts, Anemia, Hypertension, Date Acquired: 11/13/2014 History: Osteoarthritis Weeks Of Treatment: 26 Clustered Wound: No Photos Photo Uploaded By: Alejandro Mulling on 07/18/2015 16:26:44 Wound Measurements Length: (cm) 4.5 Width: (cm) 2 Depth: (cm) 0.2 Area: (cm) 7.069 Volume: (cm) 1.414 % Reduction in Area: 93.1% % Reduction in Volume: 95.4% Epithelialization: Medium (34-66%) Tunneling: No Undermining: No Wound Description Full Thickness Without Exposed Classification: Support Structures Wound Margin: Distinct, outline attached Exudate Medium Amount: Exudate Type: Serosanguineous Exudate Color: red, brown Foul Odor After Cleansing: No Wound Bed Granulation Amount: Large (67-100%) Exposed Structure Granulation Quality: Pink, Pale Fascia Exposed: No Necrotic Amount: Small (1-33%) Fat Layer Exposed: No [name], [name] ([id number]) Necrotic Quality: Adherent Slough Tendon Exposed: No Muscle Exposed: No Joint Exposed: No Bone Exposed: No Limited to Skin Breakdown Periwound Skin Texture Texture Color No Abnormalities Noted: No No Abnormalities Noted: No Callus: No Atrophie Blanche: No Crepitus: No Cyanosis: No Excoriation: No Ecchymosis: No Fluctuance: No Erythema: No Friable: No Hemosiderin Staining: No Induration: No Mottled: No Localized Edema: No Pallor: No Rash: No Rubor: No Scarring: No Temperature / Pain Moisture Temperature: No Abnormality No Abnormalities Noted: No Dry / Scaly: No Maceration: No Moist: Yes Wound Preparation Ulcer Cleansing: Other: soap and water, Topical Anesthetic Applied: None, Other: lidocaine 4%, Treatment Notes Wound #5 (Right, Medial Lower Leg) 1. Cleansed with: Clean wound with Normal Saline Cleanse wound with antibacterial soap and water 2. Anesthetic Topical Lidocaine 4% cream  to wound bed prior to debridement 5. Secondary Dressing Applied ABD Pad 7. Secured with 4-Layer Compression System - Right Lower Extremity Notes puraply applied by Dr Meyer Russel today Electronic Signature(s) Signed: 07/18/2015 5:20:37 PM By: [name] BSN, RN Entered By: [name] on 07/18/2015 15:47:01 [name], [name] ([id number]) [name], [name] ([id number]) -------------------------------------------------------------------------------- Vitals Details Patient Name: [name], [name] Date of Service: 07/18/2015 3:30 PM Medical Record Number:  [id number] Patient Account Number: [id number] Date of Birth/Sex: 1935/08/08 (80 y.o. Male) Treating RN: [name] Mealy, RN, BSN, [place] [name] Primary Care Physician: [name], [name] Other Clinician: Referring Physician: Terance [name], [name] Treating Physician/Extender: [name] in Treatment: 26 Vital Signs Time Taken: 15:33 Temperature (F): 98.6 Height (in): 68 Pulse (bpm): 78 Blood Pressure (mmHg): 186/86 Reference Range: 80 - 120 mg / dl Electronic Signature(s) Signed: 07/18/2015 5:20:37 PM By: [name] BSN, RN Entered By: [name] on 07/18/2015 15:37:49

## 2015-07-19 NOTE — Progress Notes (Addendum)
HAGAN, VANAUKEN ([id number]) Visit Report for 07/18/2015 Chief Complaint Document Details Patient Name: [name], [name] Date of Service: 07/18/2015 3:30 PM Medical Record Number: [id number] Patient Account Number: [id number] Date of Birth/Sex: Nov 27, 1935 (80 y.o. Male) Treating RN: Leonard Downing Primary Care Physician: Dorothey Baseman Other Clinician: Referring Physician: Dorothey Baseman Treating Physician/Extender: Rudene Re in Treatment: 26 Information Obtained from: Patient Chief Complaint Patient presents for treatment of an open ulcer due to venous insufficiency. The patient has had a open wound on his right medial ankle for at least 3 years and was last seen in the wound clinic in February 2015. He was lost to follow-up after that. Electronic Signature(s) Signed: 07/18/2015 4:35:28 PM By: Evlyn Kanner MD, FACS Entered By: Evlyn Kanner on 07/18/2015 16:35:28 JOEPH, SZATKOWSKI ([id number]) -------------------------------------------------------------------------------- Cellular or Tissue Based Product Details Patient Name: [name], [name] Date of Service: 07/18/2015 3:30 PM Medical Record Number: [id number] Patient Account Number: [id number] Date of Birth/Sex: [birth year] (80 y.o. Male) Treating RN: Leonard Downing Primary Care Physician: Dorothey Baseman Other Clinician: Referring Physician: Dorothey Baseman Treating Physician/Extender: Rudene Re in Treatment: 26 Cellular or Tissue Based Wound #5 Right,Medial Lower Leg Product Type Applied to: Performed By: Physician Evlyn Kanner, MD Cellular or Tissue Based Other Product Type: Time-Out Taken: Yes Location: trunk / arms / legs Wound Size (sq cm): 9 Product Size (sq cm): 42 Waste Size (sq cm): 33 Waste Reason: extra Amount of Product Applied (sq cm): 9 Lot #: [id number] Expiration Date: 03/14/2017 Fenestrated: No Reconstituted: Yes Solution Type: saline Solution Amount: 3ml Lot #:  [id number] Solution Expiration 02/03/2017 Date: Secured: Yes Secured With: Steri-Strips Dressing Applied: Yes Primary Dressing: mepitel one Procedural Pain: 0 Post Procedural Pain: 0 Response to Treatment: Procedure was tolerated well Post Procedure Diagnosis Same as Pre-procedure Electronic Signature(s) Signed: 07/18/2015 4:35:22 PM By: Evlyn Kanner MD, FACS Entered By: Evlyn Kanner on 07/18/2015 16:35:22 DAYVIN, ABER ([id number]) -------------------------------------------------------------------------------- HPI Details Patient Name: [name] Date of Service: 07/18/2015 3:30 PM Medical Record Number: [id number] Patient Account Number: [id number] Date of Birth/Sex: 1936/02/24 (80 y.o. Male) Treating RN: Leonard Downing Primary Care Physician: Dorothey Baseman Other Clinician: Referring Physician: Dorothey Baseman Treating Physician/Extender: Rudene Re in Treatment: 26 History of Present Illness Location: right lower extremity ulceration Quality: Patient reports experiencing a dull pain to affected area(s). Severity: Patient states wound are getting worse. 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 Modifying Factors: Consults to this date include:has received treatment in the wound center in the past but stopped coming here since February 2015 Associated Signs and Symptoms: Patient reports having difficulty standing for long periods. HPI Description: 80 year old male who is known to have progressive weakness and has been in a nursing home for a while has had chronic lower extremity edema both legs and a large open wound on the right lower extremity which is has at least for  about 3-4 years. The patient was seen in the wound clinic before and has been treated until February 2015 when he was lost to follow-up. Past medical history is significant for hypertension, gout, venous stasis ulcers, Parkinson's [name], constipation. He's not been a diabetic but his last hemoglobin  A1c was 6 in March 2015. There is documentation that he's had endovenous ablation of bilateral varicose veins but we have no documentation about this and this may be done at least 4-5 years ago. 01/22/2015 -- he has his vascular test is scheduled for this afternoon. 01/29/2015 -- the patient's vascular test was canceled last week because he was unable to get onto the examining bed at [facility]. His Unna's boot was not applied either and the patient had a dressing placed by home health. Today when his dressing was removed we found a lot of maggots in his right lower extremity. 02/07/2015 -- Was seen in the vascular office by the PA and she noted that a bilateral venous duplex study did not show any DVT, SVT or reflux bilaterally. The patient was advised to continue with Unna's boot and would at some stage to require graduated compression stockings of the 20-30 mmHg variety. In the future lymphedema pumps would also be considered. 02/14/2015 -- his dressing is smelling quite a bit and there is a discoloration of the wound suggestive of an infection. Deep tissue cultures will be taken today. 02/21/2015 -- the culture is back and it has growth moderate growth of Proteus and gram-negative rods sensitive to sulfa, ampicillin, cephazolin, Zosyn. He is already on doxycycline and his wound is looking clean and hence we will continue with this. 02/28/2015 -- he's been doing fine still on his antibiotics and has no fresh issues. 03/07/2015 -- I was told that because he lives in a nursing home the Apligraf was denied by his insurance company. 03/14/2015 -- we are awaiting samples and a trial of Puraply to  be tried for his ulceration. 03/21/2015 -- his Apligraf was approved and he is here for his first application of Apligraf 03/28/2015 -- his wound has had quite a bit of secretion and needs to be changed and hence I will use the HARRISON, ZETINA. ([id number]) second application of Apligraf. 04/04/2015 -- he is here for a wound check but has a lot of secretions and hence his dressing needs to be taken down. 04/09/2015 -- he is here for his third application of Apligraf 04/18/2015 -- he is here for a wound check and though he does not have any overt infection he always has a foul odor to his leg. 04/25/2015 -- his wound has been doing much better and he has minimal drainage and the size is smaller. Of note he is on doxycycline. 05/02/2015 -- he is here for his fourth application of Apligraf 05/09/2015 -- he is here for a wound check and is bothered by a lot of odor from the wound. 05/16/2015 -- he has done very well, the odor and drainage is less with the wound dressing having been changed twice this week. He is here for his last application of Apligraf. 06/20/2015 -- an area superior to the previous wound has now opened up and this is a fairly superficial ulceration. 07/12/2015 -- he is here for his first application of Puraply. This is a sample and this is no charge. 07/18/2015 -- he is here for his second application of Puraply. This is a sample and this is no charge. Electronic Signature(s) Signed: 07/18/2015 4:36:10 PM By: Evlyn Kanner MD, FACS Entered By: Evlyn Kanner on 07/18/2015 16:36:10 CAROLYN, MANISCALCO ([id number]) -------------------------------------------------------------------------------- Physical Exam Details Patient Name: [name], [name] Date of Service: 07/18/2015 3:30 PM Medical Record Number: [id number] Patient Account Number: [id number] Date of Birth/Sex: 05-08-1936 (80 y.o. Male) Treating RN: Leonard Downing  Primary Care Physician: Terance Hart, DAVID Other  Clinician: Referring Physician: Dorothey Baseman Treating Physician/Extender: Rudene Re in Treatment: 26 Constitutional . Pulse regular. Respirations normal and unlabored. Afebrile. . Eyes Nonicteric. Reactive to light. Ears, Nose, Mouth, and Throat Lips, teeth, and gums WNL.Marland Kitchen Moist mucosa without lesions. Neck supple and nontender. No palpable supraclavicular or cervical adenopathy. Normal sized without goiter. Respiratory WNL. No retractions.. Cardiovascular Pedal Pulses WNL. No clubbing, cyanosis or edema. Lymphatic No adneopathy. No adenopathy. No adenopathy. Musculoskeletal Adexa without tenderness or enlargement.. Digits and nails w/o clubbing, cyanosis, infection, petechiae, ischemia, or inflammatory conditions.. Integumentary (Hair, Skin) No suspicious lesions. No crepitus or fluctuance. No peri-wound warmth or erythema. No masses.Marland Kitchen Psychiatric Judgement and insight Intact.. No evidence of depression, anxiety, or agitation.. Notes the wound is nice and clean and after debriding it sharply with saline gauze he is ready for his application of Puraply Electronic Signature(s) Signed: 07/18/2015 4:36:48 PM By: Evlyn Kanner MD, FACS Entered By: Evlyn Kanner on 07/18/2015 16:36:48 TYMON, NEMETZ ([id number]) -------------------------------------------------------------------------------- Physician Orders Details Patient Name: KIERRE, HINTZ. Date of Service: 07/18/2015 3:30 PM Medical Record Number: [id number] Patient Account Number: [id number] Date of Birth/Sex: 1936-05-08 (79 y.o. 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 Skin Barriers/Peri-Wound Care Wound #5 Right,Medial Lower Leg o Moisturizing lotion Primary Wound Dressing Wound #5 Right,Medial Lower Leg o Other: - Puraply Antimicrobial wound matrix Secondary Dressing Wound #5 Right,Medial Lower Leg o ABD pad - Carboflex and ABD; gauze to bolster Dressing Change Frequency Wound #5 Right,Medial Lower Leg o Change dressing every week Follow-up Appointments Wound #5 Right,Medial Lower Leg o Return Appointment in 1 week. Edema Control Wound #5 Right,Medial Lower Leg RENO, CLASBY ([id number]) o 4-Layer Compression System - Right Lower Extremity Electronic Signature(s) Signed: 07/18/2015 4:38:48 PM By: Evlyn Kanner MD, FACS Signed: 07/18/2015 5:20:37 PM By: Elpidio Eric BSN, RN Entered By: Elpidio Eric on 07/18/2015 16:07:58 ANTOWAN, SAMFORD ([id number]) -------------------------------------------------------------------------------- Problem List Details Patient Name: [name], [name] Date of Service: 07/18/2015 3:30 PM Medical Record Number: [id number] Patient Account Number: [id number] Date of Birth/Sex: 11/01/1935 (80 y.o. 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s of 01/15/2015 Yes muscle E66.01 Morbid (severe)  obesity due to excess calories 01/15/2015 Yes I89.0 Lymphedema, not elsewhere classified 02/07/2015 Yes Inactive Problems Resolved Problems Electronic Signature(s) Signed: 07/18/2015 4:35:12 PM By: Evlyn Kanner MD, FACS Previous Signature: 07/18/2015 4:03:55 PM Version By: Evlyn Kanner MD, FACS Entered By: Evlyn Kanner on 07/18/2015 16:35:12 GEO, SLONE ([id number]) -------------------------------------------------------------------------------- Progress Note Details Patient Name: [name] Date of Service: 07/18/2015 3:30 PM Medical Record Number: [id number] Patient Account Number: [id number] Date of Birth/Sex: [birth year] (80 y.o. Male) Treating RN:  Leonard Downing Primary Care Physician: Dorothey Baseman Other Clinician: Referring Physician: Dorothey Baseman Treating Physician/Extender: Rudene Re in Treatment: 26 Subjective Chief Complaint Information obtained from Patient Patient presents for treatment of an open ulcer due to venous insufficiency. The patient has had a open wound on his right medial ankle for at least 3 years and was last seen in the wound clinic in February 2015. He was lost to follow-up after that. History of Present Illness (HPI) The following HPI elements were documented for the patient's wound: Location: right lower extremity ulceration Quality: Patient reports experiencing a dull pain to affected area(s). Severity: Patient states wound are getting worse. 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 Modifying Factors: Consults to this date include:has received treatment in the wound center in the past but stopped coming here since February  2015 Associated Signs and Symptoms: Patient reports having difficulty standing for long periods. 80 year old male who is known to have progressive weakness and has been in a nursing home for a while has had chronic lower extremity edema both legs and a large open wound on the right lower extremity which is has at least for about 3-4 years. The patient was seen in the wound clinic before and has been treated until February 2015 when he was lost to follow-up. Past medical history is significant for hypertension, gout, venous stasis ulcers, Parkinson's [name], constipation. He's not been a diabetic but his last hemoglobin A1c was 6 in March 2015. There is documentation that he's had endovenous ablation of bilateral varicose veins but we have no documentation about this and this may be done at least 4-5 years ago. 01/22/2015 -- he has his vascular test is scheduled for this afternoon. 01/29/2015 -- the patient's vascular test was canceled last week because he was unable to get onto the examining bed at [facility]. His Unna's boot was not applied either and the patient had a dressing placed by home health. Today when his dressing was removed we found a lot of maggots in his right lower extremity. 02/07/2015 -- Was seen in the vascular office by the PA and she noted that a bilateral venous duplex study did not show any DVT, SVT or reflux bilaterally. The patient was advised to continue with Unna's boot and would at some stage to require graduated compression stockings of the 20-30 mmHg variety. In the future lymphedema pumps would also be considered. 02/14/2015 -- his dressing is smelling quite a bit and there is a discoloration of the wound suggestive of an BRAXSON, HOLLINGSWORTH. ([id number]) infection. Deep tissue cultures will be taken today. 02/21/2015 -- the culture is back and it has growth moderate growth of Proteus and gram-negative rods sensitive to sulfa, ampicillin, cephazolin, Zosyn. He is  already on doxycycline and his wound is looking clean and hence we will continue with this. 02/28/2015 -- he's been doing fine still on his antibiotics and has no fresh issues. 03/07/2015 -- I was told that because he lives in a nursing home the Apligraf was denied by his insurance company. 03/14/2015 -- we are awaiting samples and a trial of Puraply to be tried for his ulceration. 03/21/2015 -- his Apligraf was approved  and he is here for his first application of Apligraf 03/28/2015 -- his wound has had quite a bit of secretion and needs to be changed and hence I will use the second application of Apligraf. 04/04/2015 -- he is here for a wound check but has a lot of secretions and hence his dressing needs to be taken down. 04/09/2015 -- he is here for his third application of Apligraf 04/18/2015 -- he is here for a wound check and though he does not have any overt infection he always has a foul odor to his leg. 04/25/2015 -- his wound has been doing much better and he has minimal drainage and the size is smaller. Of note he is on doxycycline. 05/02/2015 -- he is here for his fourth application of Apligraf 05/09/2015 -- he is here for a wound check and is bothered by a lot of odor from the wound. 05/16/2015 -- he has done very well, the odor and drainage is less with the wound dressing having been changed twice this week. He is here for his last application of Apligraf. 06/20/2015 -- an area superior to the previous wound has now opened up and this is a fairly superficial ulceration. 07/12/2015 -- he is here for his first application of Puraply. This is a sample and this is no charge. 07/18/2015 -- he is here for his second application of Puraply. This is a sample and this is no charge. Objective Constitutional Pulse regular. Respirations normal and unlabored. Afebrile. Vitals Time Taken: 3:33 PM, Height: 68 in, Temperature: 98.6 F, Pulse: 78 bpm, Blood Pressure:  186/86 mmHg. Eyes Nonicteric. Reactive to light. Ears, Nose, Mouth, and Throat Lips, teeth, and gums WNL.Marland Kitchen Moist mucosa without lesions. [name], [name] ([id number]) Neck supple and nontender. No palpable supraclavicular or cervical adenopathy. Normal sized without goiter. Respiratory WNL. No retractions.. Cardiovascular Pedal Pulses WNL. No clubbing, cyanosis or edema. Lymphatic No adneopathy. No adenopathy. No adenopathy. Musculoskeletal Adexa without tenderness or enlargement.. Digits and nails w/o clubbing, cyanosis, infection, petechiae, ischemia, or inflammatory conditions.Marland Kitchen Psychiatric Judgement and insight Intact.. No evidence of depression, anxiety, or agitation.. General Notes: the wound is nice and clean and after debriding it sharply with saline gauze he is ready for his application of Puraply Integumentary (Hair, Skin) No suspicious lesions. No crepitus or fluctuance. No peri-wound warmth or erythema. No masses.. Wound #5 status is Open. Original cause of wound was Gradually Appeared. The wound is located on the Right,Medial Lower Leg. The wound measures 4.5cm length x 2cm width x 0.2cm depth; 7.069cm^2 area and 1.414cm^3 volume. The wound is limited to skin breakdown. There is no tunneling or undermining noted. There is a medium amount of serosanguineous drainage noted. The wound margin is distinct with the outline attached to the wound base. There is large (67-100%) pink, pale granulation within the wound bed. There is a small (1-33%) amount of necrotic tissue within the wound bed including Adherent Slough. The periwound skin appearance exhibited: Moist. The periwound skin appearance did not exhibit: Callus, Crepitus, Excoriation, Fluctuance, Friable, Induration, Localized Edema, Rash, Scarring, Dry/Scaly, Maceration, Atrophie Blanche, Cyanosis, Ecchymosis, Hemosiderin Staining, Mottled, Pallor, Rubor, Erythema. Periwound temperature was noted as No  Abnormality. 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 A skin graft procedure using a bioengineered skin substitute/cellular or tissue based product was performed by Evlyn Kanner, MD. Other was applied and secured with Steri-Strips. 9 sq cm of product was utilized and 33 sq cm was wasted due to extra. Post Application, mepitel one was applied. A Time Out was conducted prior to the start of the procedure. The procedure was tolerated well with a pain level of 0 throughout and a pain level of 0 following the procedure. Post procedure Diagnosis Wound #5: Same as Pre-Procedure . Plan Wound Cleansing: Wound #5 Right,Medial Lower Leg: Cleanse wound with mild soap and water No tub bath. Skin Barriers/Peri-Wound Care: Wound #5 Right,Medial Lower Leg: Moisturizing lotion Primary Wound Dressing: Wound #5 Right,Medial Lower Leg: Other: - Puraply Antimicrobial wound matrix Secondary Dressing: Wound #5 Right,Medial Lower Leg: ABD pad - Carboflex and ABD; gauze to bolster Dressing Change Frequency: Wound #5 Right,Medial Lower Leg: Change dressing every week Follow-up Appointments: Wound #5 Right,Medial Lower Leg: Return Appointment in 1 week. Edema Control: Wound #5 Right,Medial Lower Leg: 4-Layer Compression System - Right Lower Extremity TEJAN, ZUHLKE ([id number]) The patient has been doing fine and his wound is clean. He has had his first application of Puraply applied and we will then bolstered in place and apply a 4-layer Profore. He will be back to see as next week. Electronic Signature(s) Signed:  07/18/2015 4:37:09 PM By: Evlyn Kanner MD, FACS Entered By: Evlyn Kanner on 07/18/2015 16:37:09 SRIJAN, BRUNGARDT ([id number]) -------------------------------------------------------------------------------- SuperBill Details Patient Name: [name], [name] Date of Service: 07/18/2015 Medical Record Number: [id number] Patient Account Number: [id number] Date of Birth/Sex: 08-Jul-1935 (80 y.o. 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 E66.01 Morbid (severe) obesity due to excess calories I89.0 Lymphedema, not elsewhere classified Facility Procedures CPT4 Code Description: [id number] - SKIN SUB GRAFT TRNK/ARM/LEG ICD-10 Description Diagnosis I87.311 Chronic venous hypertension (idiopathic) with ulcer of 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 E66.01 Morbid (severe) obesity  due to excess calories I89.0 Lymphedema, not elsewhere classified Modifier: right lower s of muscle Quantity: 1 extremity Physician Procedures CPT4 Code Description: [id number] 15271 - WC PHYS SKIN SUB GRAFT TRNK/ARM/LEG ICD-10 Description Diagnosis I87.311 Chronic venous hypertension (idiopathic) with ulcer of 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s E66.01 Morbid (severe)  obesity due to excess calories I89.0 Lymphedema, not elsewhere classified Modifier: right lower of muscle Quantity: 1 extremity Electronic Signature(s) Signed: 07/18/2015 4:37:29 PM By: Evlyn Kanner MD, FACS Entered By: Evlyn Kanner on 07/18/2015 16:37:29

## 2015-07-25 ENCOUNTER — Encounter: Payer: Medicare Other | Admitting: Surgery

## 2015-07-25 DIAGNOSIS — I87311 Chronic venous hypertension (idiopathic) with ulcer of right lower extremity: Secondary | ICD-10-CM | POA: Diagnosis not present

## 2015-07-26 NOTE — Progress Notes (Signed)
[name], [name] ([id number]) Visit Report for 07/25/2015 Chief Complaint Document Details Patient Name: [name], [name] Date of Service: 07/25/2015 2:45 PM Medical Record Number: [id number] Patient Account Number: [id number] Date of Birth/Sex: Feb 10, 1936 (80 y.o. Male) Treating RN: Curtis Sites Primary Care Physician: Dorothey Baseman Other Clinician: Referring Physician: Dorothey Baseman Treating Physician/Extender: Rudene Re in Treatment: 27 Information Obtained from: Patient Chief Complaint Patient presents for treatment of an open ulcer due to venous insufficiency. The patient has had a open wound on his right medial ankle for at least 3 years and was last seen in the wound clinic in February 2015. He was lost to follow-up after that. Electronic Signature(s) Signed: 07/25/2015 4:17:10 PM By: Evlyn Kanner MD, FACS Entered By: Evlyn Kanner on 07/25/2015 16:17:10 [name], [name] ([id number]) -------------------------------------------------------------------------------- Cellular or Tissue Based Product Details Patient Name: [name], [name] Date of Service: 07/25/2015 2:45 PM Medical Record Number: [id number] Patient Account Number: [id number] Date of Birth/Sex: 03-30-1936 (80 y.o. Male) Treating RN: Curtis Sites Primary Care Physician: Dorothey Baseman Other Clinician: Referring Physician: Dorothey Baseman Treating Physician/Extender: Rudene Re in Treatment: 27 Cellular or Tissue Based Wound #5 Right,Medial Lower Leg Product Type Applied to: Performed By: Physician Evlyn Kanner, MD Cellular or Tissue Based Other Product Type: Time-Out Taken: Yes Location: genitalia / hands / feet / multiple digits Wound Size (sq cm): 8.8 Product Size (sq cm): 54 Waste Size (sq cm): 45 Waste Reason: wound size Amount of Product Applied (sq cm): 9 Lot #: [id number] Expiration Date: 03/08/2017 Fenestrated: No Reconstituted: Yes Solution Type:  saline Solution Amount: 2ml Lot #: [id number] Solution Expiration 11/03/2016 Date: Secured: Yes Secured With: Steri-Strips Dressing Applied: Yes Primary Dressing: mepitel one Procedural Pain: 0 Post Procedural Pain: 0 Response to Treatment: Procedure was tolerated well Post Procedure Diagnosis Same as Pre-procedure Electronic Signature(s) Signed: 07/25/2015 4:17:00 PM By: Evlyn Kanner MD, FACS Entered By: Evlyn Kanner on 07/25/2015 16:17:00 [name], [name] ([id number]) -------------------------------------------------------------------------------- HPI Details Patient Name: [name] Date of Service: 07/25/2015 2:45 PM Medical Record Number: [id number] Patient Account Number: [id number] Date of Birth/Sex: 1936/05/28 (80 y.o. Male) Treating RN: Curtis Sites Primary Care Physician: Dorothey Baseman Other Clinician: Referring Physician: Dorothey Baseman Treating Physician/Extender: Rudene Re in Treatment: 27 History of Present Illness Location: right lower extremity ulceration Quality: Patient reports experiencing a dull pain to affected area(s). Severity: Patient states wound are getting worse. 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 Modifying Factors: Consults to this date include:has received treatment in the wound center in the past but stopped coming here since February 2015 Associated Signs and Symptoms: Patient reports having difficulty standing for long periods. HPI Description: 80 year old male who is known to have progressive weakness and has been in a nursing home for a while has had chronic lower extremity edema both legs and a large open wound on the right lower  extremity which is has at least for about 3-4 years. The patient was seen in the wound clinic before and has been treated until February 2015 when he was lost to follow-up. Past medical history is significant for hypertension, gout, venous stasis ulcers, Parkinson's [name], constipation. He's not been a diabetic  but his last hemoglobin A1c was 6 in March 2015. There is documentation that he's had endovenous ablation of bilateral varicose veins but we have no documentation about this and this may be done at least 4-5 years ago. 01/22/2015 -- he has his vascular test is scheduled for this afternoon. 01/29/2015 -- the patient's vascular test was canceled last week because he was unable to get onto the examining bed at [facility]. His Unna's boot was not applied either and the patient had a dressing placed by home health. Today when his dressing was removed we found a lot of maggots in his right lower extremity. 02/07/2015 -- Was seen in the vascular office by the PA and she noted that a bilateral venous duplex study did not show any DVT, SVT or reflux bilaterally. The patient was advised to continue with Unna's boot and would at some stage to require graduated compression stockings of the 20-30 mmHg variety. In the future lymphedema pumps would also be considered. 02/14/2015 -- his dressing is smelling quite a bit and there is a discoloration of the wound suggestive of an infection. Deep tissue cultures will be taken today. 02/21/2015 -- the culture is back and it has growth moderate growth of Proteus and gram-negative rods sensitive to sulfa, ampicillin, cephazolin, Zosyn. He is already on doxycycline and his wound is looking clean and hence we will continue with this. 02/28/2015 -- he's been doing fine still on his antibiotics and has no fresh issues. 03/07/2015 -- I was told that because he lives in a nursing home the Apligraf was denied by his insurance company. 03/14/2015 -- we are awaiting  samples and a trial of Puraply to be tried for his ulceration. 03/21/2015 -- his Apligraf was approved and he is here for his first application of Apligraf 03/28/2015 -- his wound has had quite a bit of secretion and needs to be changed and hence I will use the [name], [name] ([id number]) second application of Apligraf. 04/04/2015 -- he is here for a wound check but has a lot of secretions and hence his dressing needs to be taken down. 04/09/2015 -- he is here for his third application of Apligraf 04/18/2015 -- he is here for a wound check and though he does not have any overt infection he always has a foul odor to his leg. 04/25/2015 -- his wound has been doing much better and he has minimal drainage and the size is smaller. Of note he is on doxycycline. 05/02/2015 -- he is here for his fourth application of Apligraf 05/09/2015 -- he is here for a wound check and is bothered by a lot of odor from the wound. 05/16/2015 -- he has done very well, the odor and drainage is less with the wound dressing having been changed twice this week. He is here for his last application of Apligraf. 06/20/2015 -- an area superior to the previous wound has now opened up and this is a fairly superficial ulceration. 07/12/2015 -- he is here for his first application of Puraply. This is a sample and this is no charge. 07/18/2015 -- he is here for his second application of Puraply. This is a sample and this is no charge. 07/25/2015 -- he is here for his third application of Puraply. This is a sample and this is no charge. Electronic Signature(s) Signed: 07/25/2015 4:17:53 PM By: Evlyn Kanner MD, FACS Entered By: Evlyn Kanner on 07/25/2015 16:17:53 Deyton, Ellenbecker Chrissie [name] ([id number]) -------------------------------------------------------------------------------- Physical Exam Details Patient Name: ERROLL, WILBOURNE. Date  of Service: 07/25/2015 2:45 PM Medical Record Number: [id number] Patient Account Number:  [id number] Date of Birth/Sex: 10/02/1935 (80 y.o. Male) Treating RN: Curtis Sites Primary Care Physician: Dorothey Baseman Other Clinician: Referring Physician: Terance Hart, DAVID Treating Physician/Extender: Rudene Re in Treatment: 27 Constitutional . Pulse regular. Respirations normal and unlabored. Afebrile. . Eyes Nonicteric. Reactive to light. Ears, Nose, Mouth, and Throat Lips, teeth, and gums WNL.Marland Kitchen Moist mucosa without lesions. Neck supple and nontender. No palpable supraclavicular or cervical adenopathy. Normal sized without goiter. Respiratory WNL. No retractions.. Cardiovascular Pedal Pulses WNL. No clubbing, cyanosis or edema. Lymphatic No adneopathy. No adenopathy. No adenopathy. Musculoskeletal Adexa without tenderness or enlargement.. Digits and nails w/o clubbing, cyanosis, infection, petechiae, ischemia, or inflammatory conditions.. Integumentary (Hair, Skin) No suspicious lesions. No crepitus or fluctuance. No peri-wound warmth or erythema. No masses.Marland Kitchen Psychiatric Judgement and insight Intact.. No evidence of depression, anxiety, or agitation.. Notes the wound is clean with minimal debris which was partially from the skin substitute. This was sharply removed with moist saline gauze and he is ready for his next application of Puraply. Electronic Signature(s) Signed: 07/25/2015 4:18:45 PM By: Evlyn Kanner MD, FACS Entered By: Evlyn Kanner on 07/25/2015 16:18:45 [name], [name] ([id number]) -------------------------------------------------------------------------------- Physician Orders Details Patient Name: [name], [name] Date of Service: 07/25/2015 2:45 PM Medical Record Number: [id number] Patient Account Number: [id number] Date of Birth/Sex: June 29, 1936 (80 y.o. Male) Treating RN: Curtis Sites Primary Care Physician: Dorothey Baseman Other Clinician: Referring Physician: Dorothey Baseman Treating Physician/Extender: Rudene Re in  Treatment: 79 Verbal / Phone Orders: Yes Clinician: Curtis Sites Read Back and Verified: Yes Diagnosis Coding Wound Cleansing Wound #5 Right,Medial Lower Leg o Cleanse wound with mild soap and water o No tub bath. Skin Barriers/Peri-Wound Care Wound #5 Right,Medial Lower Leg o Moisturizing lotion Secondary Dressing Wound #5 Right,Medial Lower Leg o ABD pad - Carboflex and ABD; gauze to bolster o Mepitel One - over PuraPly Dressing Change Frequency Wound #5 Right,Medial Lower Leg o Change dressing every week Follow-up Appointments Wound #5 Right,Medial Lower Leg o Return Appointment in 1 week. Edema Control Wound #5 Right,Medial Lower Leg o 4-Layer Compression System - Right Lower Extremity Electronic Signature(s) Signed: 07/25/2015 4:22:03 PM By: Evlyn Kanner MD, FACS Signed: 07/25/2015 5:48:27 PM By: Curtis Sites Entered By: Curtis Sites on 07/25/2015 15:51:14 [name], [name] ([id number]) -------------------------------------------------------------------------------- Problem List Details Patient Name: [name], [name] Date of Service: 07/25/2015 2:45 PM Medical Record Number: [id number] Patient Account Number: [id number] Date of Birth/Sex: [birth year] (80 y.o. 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 Date of Service: 07/25/2015 2:45 PM Medical Record Number: [id number] Patient Account Number: [id number] Date of Birth/Sex: Mar 14, 1936 (80 y.o. Male) Treating RN: Curtis Sites Primary Care Physician: Dorothey Baseman Other Clinician: Referring Physician: Dorothey Baseman Treating Physician/Extender: Rudene Re in Treatment: 27 Subjective Chief Complaint Information obtained from Patient Patient presents for treatment of an open  ulcer due to venous insufficiency. The patient has had a open wound on his right medial ankle for at least 3 years and was last seen in the wound clinic in February 2015. He was lost to follow-up after that. History of Present Illness (HPI) The following HPI elements were documented for the patient's wound: Location: right lower extremity ulceration Quality: Patient reports experiencing a dull pain to affected area(s). Severity: Patient states wound are getting worse. 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 Modifying Factors: Consults to this date include:has received treatment in the wound center in the past but stopped coming here since February 2015 Associated Signs and Symptoms: Patient reports having difficulty standing for long periods. 80 year old male who is known to have progressive weakness and has been in a nursing home for a while has had chronic lower extremity edema both legs and a large open wound on the right lower extremity which  is has at least for about 3-4 years. The patient was seen in the wound clinic before and has been treated until February 2015 when he was lost to follow-up. Past medical history is significant for hypertension, gout, venous stasis ulcers, Parkinson's [name], constipation. He's not been a diabetic but his last hemoglobin A1c was 6 in March 2015. There is documentation that he's had endovenous ablation of bilateral varicose veins but we have no documentation about this and this may be done at least 4-5 years ago. 01/22/2015 -- he has his vascular test is scheduled for this afternoon. 01/29/2015 -- the patient's vascular test was canceled last week because he was unable to get onto the examining bed at [facility]. His Unna's boot was not applied either and the patient had a dressing placed by home health. Today when his dressing was removed we found a lot of maggots in his right lower extremity. 02/07/2015 -- Was seen in the vascular office by the PA and she noted that a bilateral venous duplex study did not show any DVT, SVT or reflux bilaterally. The patient was advised to continue with Unna's boot and would at some stage to require graduated compression stockings of the 20-30 mmHg variety. In the future lymphedema pumps would also be considered. 02/14/2015 -- his dressing is smelling quite a bit and there is a discoloration of the wound suggestive of an [name], [name] ([id number]) infection. Deep tissue cultures will be taken today. 02/21/2015 -- the culture is back and it has growth moderate growth of Proteus and gram-negative rods sensitive to sulfa, ampicillin, cephazolin, Zosyn. He is already on doxycycline and his wound is looking clean and hence we will continue with this. 02/28/2015 -- he's been doing fine still on his antibiotics and has no fresh issues. 03/07/2015 -- I was told that because he lives in a nursing home the Apligraf was denied by his insurance company. 03/14/2015 --  we are awaiting samples and a trial of Puraply to be tried for his ulceration. 03/21/2015 -- his Apligraf was approved and he is here for his first application of Apligraf 03/28/2015 -- his wound has had quite a bit of secretion and needs to be changed and hence I will use the second application of  Apligraf. 04/04/2015 -- he is here for a wound check but has a lot of secretions and hence his dressing needs to be taken down. 04/09/2015 -- he is here for his third application of Apligraf 04/18/2015 -- he is here for a wound check and though he does not have any overt infection he always has a foul odor to his leg. 04/25/2015 -- his wound has been doing much better and he has minimal drainage and the size is smaller. Of note he is on doxycycline. 05/02/2015 -- he is here for his fourth application of Apligraf 05/09/2015 -- he is here for a wound check and is bothered by a lot of odor from the wound. 05/16/2015 -- he has done very well, the odor and drainage is less with the wound dressing having been changed twice this week. He is here for his last application of Apligraf. 06/20/2015 -- an area superior to the previous wound has now opened up and this is a fairly superficial ulceration. 07/12/2015 -- he is here for his first application of Puraply. This is a sample and this is no charge. 07/18/2015 -- he is here for his second application of Puraply. This is a sample and this is no charge. 07/25/2015 -- he is here for his third application of Puraply. This is a sample and this is no charge. Objective Constitutional Pulse regular. Respirations normal and unlabored. Afebrile. Vitals Time Taken: 3:02 PM, Height: 68 in, Temperature: 98.7 F, Pulse: 67 bpm, Respiratory Rate: 18 breaths/min, Blood Pressure: 145/68 mmHg. Eyes Nonicteric. Reactive to light. Ears, Nose, Mouth, and Throat Lips, teeth, and gums WNL.Marland Kitchen Moist mucosa without lesions. JATORIAN, RENAULT ([id number]) Neck supple and  nontender. No palpable supraclavicular or cervical adenopathy. Normal sized without goiter. Respiratory WNL. No retractions.. Cardiovascular Pedal Pulses WNL. No clubbing, cyanosis or edema. Lymphatic No adneopathy. No adenopathy. No adenopathy. Musculoskeletal Adexa without tenderness or enlargement.. Digits and nails w/o clubbing, cyanosis, infection, petechiae, ischemia, or inflammatory conditions.Marland Kitchen Psychiatric Judgement and insight Intact.. No evidence of depression, anxiety, or agitation.. General Notes: the wound is clean with minimal debris which was partially from the skin substitute. This was sharply removed with moist saline gauze and he is ready for his next application of Puraply. Integumentary (Hair, Skin) No suspicious lesions. No crepitus or fluctuance. No peri-wound warmth or erythema. No masses.. Wound #5 status is Open. Original cause of wound was Gradually Appeared. The wound is located on the Right,Medial Lower Leg. The wound measures 4.4cm length x 2cm width x 0.2cm depth; 6.912cm^2 area and 1.382cm^3 volume. The wound is limited to skin breakdown. There is no tunneling or undermining noted. There is a medium amount of serosanguineous drainage noted. The wound margin is distinct with the outline attached to the wound base. There is medium (34-66%) pink, pale granulation within the wound bed. There is a medium (34-66%) amount of necrotic tissue within the wound bed including Adherent Slough. The periwound skin appearance exhibited: Moist. The periwound skin appearance did not exhibit: Callus, Crepitus, Excoriation, Fluctuance, Friable, Induration, Localized Edema, Rash, Scarring, Dry/Scaly, Maceration, Atrophie Blanche, Cyanosis, Ecchymosis, Hemosiderin Staining, Mottled, Pallor, Rubor, Erythema. Periwound temperature was noted as No Abnormality. 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 A skin graft procedure using a bioengineered skin substitute/cellular or tissue based product was performed by Evlyn Kanner, MD. Other was applied and secured with Steri-Strips. 9 sq cm of product was utilized and 45 sq cm was wasted due to wound size. Post Application, mepitel one was applied. A Time Out was conducted prior to the start of the procedure. The procedure was tolerated well with a pain level of 0 throughout and a pain level of 0 following the procedure. Post procedure Diagnosis Wound #5: Same as Pre-Procedure . Plan Wound Cleansing: Wound #5 Right,Medial Lower Leg: Cleanse wound with mild soap and water No tub bath. Skin Barriers/Peri-Wound Care: Wound #5 Right,Medial Lower Leg: Moisturizing lotion Secondary Dressing: Wound #5 Right,Medial Lower Leg: ABD pad - Carboflex and ABD; gauze to bolster Mepitel One - over PuraPly Dressing Change Frequency: Wound #5 Right,Medial Lower Leg: Change dressing every week Follow-up Appointments: Wound #5 Right,Medial Lower Leg: Return Appointment in 1 week. Edema Control: Wound #5 Right,Medial Lower Leg: 4-Layer Compression System - Right Lower Extremity ZAYQUAN, BOGARD ([id number]) The patient has been doing fine and his wound is clean. He has had his third application of Puraply applied and we will then bolstered in place and apply a 4-layer Profore. He will be back to see as next week. Electronic Signature(s) Signed: 07/25/2015 4:20:22 PM By: Evlyn Kanner MD, FACS Entered By: Evlyn Kanner on 07/25/2015 16:20:22 ELVIS, LAUFER  ([id number]) -------------------------------------------------------------------------------- SuperBill Details Patient Name: COSMO, TETREAULT. Date of Service: 07/25/2015 Medical Record Number: [id number] Patient Account Number: [id number] Date of Birth/Sex: April 14, 1936 (80 y.o. 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 E66.01 Morbid (severe) obesity due to excess calories I89.0 Lymphedema, not elsewhere classified Facility Procedures CPT4 Code Description: [id number] - SKIN SUB GRAFT FACE/NK/HF/G ICD-10 Description Diagnosis I87.311 Chronic venous hypertension (idiopathic) with ulcer of 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 E66.01 Morbid (severe) obesity  due to excess calories I89.0 Lymphedema, not elsewhere classified Modifier: right lower s of muscle Quantity: 1 extremity Physician Procedures CPT4 Code Description: [id number] 15275 - WC PHYS SKIN SUB GRAFT FACE/NK/HF/G ICD-10 Description Diagnosis I87.311 Chronic venous hypertension (idiopathic) with ulcer of 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s E66.01 Morbid (severe)  obesity due to excess calories I89.0 Lymphedema, not elsewhere classified Modifier: right lower of muscle Quantity: 1 extremity Electronic Signature(s) Signed: 07/25/2015 4:20:34 PM By: Evlyn Kanner MD, FACS Entered By: Evlyn Kanner on 07/25/2015 16:20:34

## 2015-07-26 NOTE — Progress Notes (Signed)
AZTLAN, BILLIPS ([id number]) Visit Report for 07/25/2015 Arrival Information Details Patient Name: [name], [name] Date of Service: 07/25/2015 2:45 PM Medical Record Number: [id number] Patient Account Number: [id number] Date of Birth/Sex: 1935-12-23 (80 y.o. Male) Treating RN: Curtis Sites Primary Care Physician: Dorothey Baseman Other Clinician: Referring Physician: Terance Hart, DAVID Treating Physician/Extender: Rudene Re in Treatment: 27 Visit Information History Since Last Visit Added or deleted any medications: No Patient Arrived: Wheel Chair Any new allergies or adverse reactions: No Arrival Time: 14:55 Had a fall or experienced change in No activities of daily living that may affect Accompanied By: self risk of falls: Transfer Assistance: None Signs or symptoms of abuse/neglect since last No Patient Identification Verified: Yes visito Secondary Verification Process Yes Hospitalized since last visit: No Completed: Pain Present Now: No Patient Requires Transmission-Based No Precautions: Patient Has Alerts: No Electronic Signature(s) Signed: 07/25/2015 5:48:27 PM By: Curtis Sites Entered By: Curtis Sites on 07/25/2015 15:02:10 [name] ([id number]) -------------------------------------------------------------------------------- Encounter Discharge Information Details Patient Name: [name] Date of Service: 07/25/2015 2:45 PM Medical Record Number: [id number] Patient Account Number: [id number] Date of Birth/Sex: 01-Jun-1936 (80 y.o. Male) Treating RN: Curtis Sites Primary Care Physician: Dorothey Baseman Other Clinician: Referring Physician: Dorothey Baseman Treating Physician/Extender: Rudene Re in Treatment: 4 Encounter Discharge Information Items Discharge Pain Level: 0 Discharge Condition: Stable Ambulatory Status: Wheelchair Discharge Destination: Nursing Home Transportation: Private Auto Accompanied By:  self Schedule Follow-up Appointment: Yes Medication Reconciliation completed and provided to Patient/Care No Galan Ghee: Provided on Clinical Summary of Care: 07/25/2015 Form Type Recipient Paper Patient [facility] Electronic Signature(s) Signed: 07/25/2015 4:18:15 PM By: Curtis Sites Previous Signature: 07/25/2015 3:52:25 PM Version By: Gwenlyn Perking Entered By: Curtis Sites on 07/25/2015 16:18:15 [name] ([id number]) -------------------------------------------------------------------------------- Lower Extremity Assessment Details Patient Name: [name] Date of Service: 07/25/2015 2:45 PM Medical Record Number: [id number] Patient Account Number: [id number] Date of Birth/Sex: October 20, 1935 (80 y.o. Male) Treating RN: Curtis Sites Primary Care Physician: Terance Hart, DAVID Other Clinician: Referring Physician: Terance Hart, DAVID Treating Physician/Extender: Rudene Re in Treatment: 27 Edema Assessment Assessed: [Left: No] [Right: No] Edema: [Left: Ye] [Right: s] Calf Left: Right: Point of Measurement: 40 cm From Medial Instep cm 38 cm Ankle Left: Right: Point of Measurement: 12 cm From Medial Instep cm 26 cm Vascular Assessment Pulses: Posterior Tibial Dorsalis Pedis Palpable: [Right:Yes] Extremity colors, hair growth, and conditions: Extremity Color: [Right:Hyperpigmented] Hair Growth on Extremity: [Right:No] Temperature of Extremity: [Right:Warm] Capillary Refill: [Right:< 3 seconds] Toe Nail Assessment Left: Right: Thick: Yes Discolored: Yes Deformed: Yes Improper Length and Hygiene: No Electronic Signature(s) Signed: 07/25/2015 5:48:27 PM By: Curtis Sites Entered By: Curtis Sites on 07/25/2015 15:04:26 [name] ([id number]) -------------------------------------------------------------------------------- Multi Wound Chart Details Patient Name: [name] Date of Service: 07/25/2015 2:45 PM Medical Record Number:  [id number] Patient Account Number: [id number] Date of Birth/Sex: 1935-08-13 (80 y.o. Male) Treating RN: Curtis Sites Primary Care Physician: Dorothey Baseman Other Clinician: Referring Physician: Terance Hart, DAVID Treating Physician/Extender: Rudene Re in Treatment: 27 Vital Signs Height(in): 68 Pulse(bpm): 67 Weight(lbs): Blood Pressure 145/68 (mmHg): Body Mass Index(BMI): Temperature(F): 98.7 Respiratory Rate 18 (breaths/min): Photos: [5:No Photos] [N/A:N/A] Wound Location: [5:Right Lower Leg - Medial] [N/A:N/A] Wounding Event: [5:Gradually Appeared] [N/A:N/A] Primary Etiology: [5:Venous Leg Ulcer] [N/A:N/A] Comorbid History: [5:Cataracts, Anemia, Hypertension, Osteoarthritis] [N/A:N/A] Date Acquired: [5:11/13/2014] [N/A:N/A] Weeks of Treatment: [5:27] [N/A:N/A] Wound Status: [5:Open] [N/A:N/A] Measurements L x W x D 4.4x2x0.2 [N/A:N/A] (cm) Area (cm) : [5:6.912] [N/A:N/A] Volume (cm) : [5:1.382] [N/A:N/A] %  Reduction in Area: [5:93.30%] [N/A:N/A] % Reduction in Volume: 95.50% [N/A:N/A] Classification: [5:Full Thickness Without Exposed Support Structures] [N/A:N/A] Exudate Amount: [5:Medium] [N/A:N/A] Exudate Type: [5:Serosanguineous] [N/A:N/A] Exudate Color: [5:red, brown] [N/A:N/A] Wound Margin: [5:Distinct, outline attached] [N/A:N/A] Granulation Amount: [5:Medium (34-66%)] [N/A:N/A] Granulation Quality: [5:Pink, Pale] [N/A:N/A] Necrotic Amount: [5:Medium (34-66%)] [N/A:N/A] Exposed Structures: [5:Fascia: No Fat: No Tendon: No Muscle: No] [N/A:N/A] Joint: No Bone: No Limited to Skin Breakdown Epithelialization: Medium (34-66%) N/A N/A Periwound Skin Texture: Edema: No N/A N/A Excoriation: No Induration: No Callus: No Crepitus: No Fluctuance: No Friable: No Rash: No Scarring: No Periwound Skin Moist: Yes N/A N/A Moisture: Maceration: No Dry/Scaly: No Periwound Skin Color: Atrophie Blanche: No N/A N/A Cyanosis: No Ecchymosis:  No Erythema: No Hemosiderin Staining: No Mottled: No Pallor: No Rubor: No Temperature: No Abnormality N/A N/A Tenderness on No N/A N/A Palpation: Wound Preparation: Ulcer Cleansing: Other: N/A N/A soap and water Topical Anesthetic Applied: None, Other: lidocaine 4% Treatment Notes Electronic Signature(s) Signed: 07/25/2015 5:48:27 PM By: Curtis Sites Entered By: Curtis Sites on 07/25/2015 15:26:53 [name] ([id number]) -------------------------------------------------------------------------------- Multi-Disciplinary Care Plan Details Patient Name: [name], [name] Date of Service: 07/25/2015 2:45 PM Medical Record Number: [id number] Patient Account Number: [id number] Date of Birth/Sex: 1935-07-21 (80 y.o. Male) Treating RN: Curtis Sites Primary Care Physician: Dorothey Baseman Other Clinician: Referring Physician: Dorothey Baseman Treating Physician/Extender: Rudene Re in Treatment: 23 Active Inactive Orientation to the Wound Care Program Nursing Diagnoses: Knowledge deficit related to the wound healing center program Goals: Patient/caregiver will verbalize understanding of the Wound Healing Center Program Date Initiated: 01/15/2015 Goal Status: Active Interventions: Provide education on orientation to the wound center Notes: Venous Leg Ulcer Nursing Diagnoses: Knowledge deficit related to disease process and management Potential for venous Insuffiency (use before diagnosis confirmed) Goals: Patient will maintain optimal edema control Date Initiated: 01/15/2015 Goal Status: Active Patient/caregiver will verbalize understanding of disease process and disease management Date Initiated: 01/15/2015 Goal Status: Active Verify adequate tissue perfusion prior to therapeutic compression application Date Initiated: 01/15/2015 Goal Status: Active Interventions: Assess peripheral edema status every visit. Compression as ordered Provide education on  venous insufficiency [name], [name] ([id number]) Treatment Activities: Test ordered outside of clinic : 07/25/2015 Therapeutic compression applied : 07/25/2015 Venous Duplex Doppler : 07/25/2015 Notes: Wound/Skin Impairment Nursing Diagnoses: Impaired tissue integrity Knowledge deficit related to ulceration/compromised skin integrity Goals: Patient/caregiver will verbalize understanding of skin care regimen Date Initiated: 01/15/2015 Goal Status: Active Ulcer/skin breakdown will have a volume reduction of 30% by week 4 Date Initiated: 01/15/2015 Goal Status: Active Ulcer/skin breakdown will have a volume reduction of 50% by week 8 Date Initiated: 01/15/2015 Goal Status: Active Ulcer/skin breakdown will have a volume reduction of 80% by week 12 Date Initiated: 01/15/2015 Goal Status: Active Ulcer/skin breakdown will heal within 14 weeks Date Initiated: 01/15/2015 Goal Status: Active Interventions: Assess patient/caregiver ability to perform ulcer/skin care regimen upon admission and as needed Assess ulceration(s) every visit Provide education on ulcer and skin care Notes: Electronic Signature(s) Signed: 07/25/2015 5:48:27 PM By: Curtis Sites Entered By: Curtis Sites on 07/25/2015 15:12:56 [name] ([id number]) -------------------------------------------------------------------------------- Patient/Caregiver Education Details Patient Name: [name] Date of Service: 07/25/2015 2:45 PM Medical Record Number: [id number] Patient Account Number: [id number] Date of Birth/Gender: 17-Sep-1935 (80 y.o. Male) Treating RN: Curtis Sites Primary Care Physician: Dorothey Baseman Other Clinician: Referring Physician: Dorothey Baseman Treating Physician/Extender: Rudene Re in Treatment: 22 Education Assessment Education Provided To: Patient Education Topics Provided Nutrition: Handouts: Nutrition Methods: Explain/Verbal Responses:  State content  correctly Electronic Signature(s) Signed: 07/25/2015 4:18:28 PM By: Curtis Sites Entered By: Curtis Sites on 07/25/2015 16:18:28 [name], [name] ([id number]) -------------------------------------------------------------------------------- Wound Assessment Details Patient Name: [name], [name] Date of Service: 07/25/2015 2:45 PM Medical Record Number: [id number] Patient Account Number: [id number] Date of Birth/Sex: 01-21-1936 (80 y.o. Male) Treating RN: Curtis Sites Primary Care Physician: Dorothey Baseman Other Clinician: Referring Physician: Terance Hart, DAVID Treating Physician/Extender: Rudene Re in Treatment: 27 Wound Status Wound Number: 5 Primary Venous Leg Ulcer Etiology: Wound Location: Right Lower Leg - Medial Wound Status: Open Wounding Event: Gradually Appeared Comorbid Cataracts, Anemia, Hypertension, Date Acquired: 11/13/2014 History: Osteoarthritis Weeks Of Treatment: 27 Clustered Wound: No Photos Photo Uploaded By: Curtis Sites on 07/25/2015 16:19:40 Wound Measurements Length: (cm) 4.4 Width: (cm) 2 Depth: (cm) 0.2 Area: (cm) 6.912 Volume: (cm) 1.382 % Reduction in Area: 93.3% % Reduction in Volume: 95.5% Epithelialization: Medium (34-66%) Tunneling: No Undermining: No Wound Description Full Thickness Without Exposed Classification: Support Structures Wound Margin: Distinct, outline attached Exudate Medium Amount: Exudate Type: Serosanguineous Exudate Color: red, brown Foul Odor After Cleansing: No Wound Bed Granulation Amount: Medium (34-66%) Exposed Structure Granulation Quality: Pink, Pale Fascia Exposed: No Necrotic Amount: Medium (34-66%) Fat Layer Exposed: No [name], [name] ([id number]) Necrotic Quality: Adherent Slough Tendon Exposed: No Muscle Exposed: No Joint Exposed: No Bone Exposed: No Limited to Skin Breakdown Periwound Skin Texture Texture Color No Abnormalities Noted: No No Abnormalities Noted:  No Callus: No Atrophie Blanche: No Crepitus: No Cyanosis: No Excoriation: No Ecchymosis: No Fluctuance: No Erythema: No Friable: No Hemosiderin Staining: No Induration: No Mottled: No Localized Edema: No Pallor: No Rash: No Rubor: No Scarring: No Temperature / Pain Moisture Temperature: No Abnormality No Abnormalities Noted: No Dry / Scaly: No Maceration: No Moist: Yes Wound Preparation Ulcer Cleansing: Other: soap and water, Topical Anesthetic Applied: None, Other: lidocaine 4%, Treatment Notes Wound #5 (Right, Medial Lower Leg) 1. Cleansed with: Cleanse wound with antibacterial soap and water 3. Peri-wound Care: Skin Prep 4. Dressing Applied: Mepitel Other dressing (specify in notes) 5. Secondary Dressing Applied ABD Pad 7. Secured with 4-Layer Compression System - Right Lower Extremity Notes carboflex, puraply applied by Dr Meyer Russel today Electronic Signature(s) Signed: 07/25/2015 5:48:27 PM By: Ebbie Ridge ([id number]) Entered By: Curtis Sites on 07/25/2015 15:12:01 [name], [name] ([id number]) -------------------------------------------------------------------------------- Vitals Details Patient Name: [name], [name] Date of Service: 07/25/2015 2:45 PM Medical Record Number: [id number] Patient Account Number: [id number] Date of Birth/Sex: Apr 11, 1936 (80 y.o. Male) Treating RN: Curtis Sites Primary Care Physician: Dorothey Baseman Other Clinician: Referring Physician: Terance Hart, DAVID Treating Physician/Extender: Rudene Re in Treatment: 27 Vital Signs Time Taken: 15:02 Temperature (F): 98.7 Height (in): 68 Pulse (bpm): 67 Respiratory Rate (breaths/min): 18 Blood Pressure (mmHg): 145/68 Reference Range: 80 - 120 mg / dl Electronic Signature(s) Signed: 07/25/2015 5:48:27 PM By: Curtis Sites Entered By: Curtis Sites on 07/25/2015 15:03:24

## 2015-08-01 ENCOUNTER — Encounter: Payer: Medicare Other | Admitting: Surgery

## 2015-08-01 DIAGNOSIS — I87311 Chronic venous hypertension (idiopathic) with ulcer of right lower extremity: Secondary | ICD-10-CM | POA: Diagnosis not present

## 2015-08-02 NOTE — Progress Notes (Addendum)
[name] ([id number]) Visit Report for 08/01/2015 Chief Complaint Document Details Patient Name: [name], [name] Date of Service: 08/01/2015 3:30 PM Medical Record Number: [id number] Patient Account Number: [id number] Date of Birth/Sex: 05/24/1936 (80 y.o. Male) Treating RN: Leonard Downing Primary Care Physician: Dorothey Baseman Other Clinician: Referring Physician: Dorothey Baseman Treating Physician/Extender: Rudene Re in Treatment: 28 Information Obtained from: Patient Chief Complaint Patient presents for treatment of an open ulcer due to venous insufficiency. The patient has had a open wound on his right medial ankle for at least 3 years and was last seen in the wound clinic in February 2015. He was lost to follow-up after that. Electronic Signature(s) Signed: 08/01/2015 3:54:08 PM By: Evlyn Kanner MD, FACS Entered By: Evlyn Kanner on 08/01/2015 15:54:08 [name], [name] ([id number]) -------------------------------------------------------------------------------- Cellular or Tissue Based Product Details Patient Name: [name] Date of Service: 08/01/2015 3:30 PM Medical Record Number: [id number] Patient Account Number: [id number] Date of Birth/Sex: 01/17/1936 (80 y.o. Male) Treating RN: Leonard Downing Primary Care Physician: Dorothey Baseman Other Clinician: Referring Physician: Dorothey Baseman Treating Physician/Extender: Rudene Re in Treatment: 28 Cellular or Tissue Based Wound #5 Right,Medial Lower Leg Product Type Applied to: Performed By: Physician Evlyn Kanner, MD Cellular or Tissue Based Other Product Type: Time-Out Taken: Yes Location: genitalia / hands / feet / multiple digits Wound Size (sq cm): 7.2 Product Size (sq cm): 54 Waste Size (sq cm): 47 Waste Reason: wound size Amount of Product Applied (sq cm): 7 Lot #: [id number] Expiration Date: 04/02/2017 Fenestrated: No Reconstituted: Yes Solution Type:  saline Solution Amount: 2ml Lot #: [id number] Solution Expiration 04/18/2017 Date: Secured: Yes Secured With: Steri-Strips Dressing Applied: Yes Primary Dressing: mepitel one Procedural Pain: 0 Post Procedural Pain: 0 Response to Treatment: Procedure was tolerated well Post Procedure Diagnosis Same as Pre-procedure Notes Puraply was used Psychologist, [facility]) Signed: 08/01/2015 4:23:55 PM By: Evlyn Kanner MD, FACS Entered By: Evlyn Kanner on 08/01/2015 16:23:54 [name], [name] ([id number]) [name], [name] ([id number]) -------------------------------------------------------------------------------- HPI Details Patient Name: [name], [name] Date of Service: 08/01/2015 3:30 PM Medical Record Number: [id number] Patient Account Number: [id number] Date of Birth/Sex: [birth year] (80 y.o. Male) Treating RN: Leonard Downing Primary Care Physician: Dorothey Baseman Other Clinician: Referring Physician: Dorothey Baseman Treating Physician/Extender: Rudene Re in Treatment: 28 History of Present Illness Location: right lower extremity ulceration Quality: Patient reports experiencing a dull pain to affected area(s). Severity: Patient states wound are getting worse. 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 Modifying Factors: Consults to this date include:has received treatment in the wound center in the past but stopped coming here since February 2015 Associated Signs and Symptoms: Patient reports having difficulty standing for long periods. HPI Description: 80 year old male who is known to have progressive weakness and has been in a nursing home for a while has had chronic lower extremity  edema both legs and a large open wound on the right lower extremity which is has at least for about 3-4 years. The patient was seen in the wound clinic before and has been treated until February 2015 when he was lost to follow-up. Past medical history is significant for hypertension, gout, venous stasis ulcers, Parkinson's  [name], constipation. He's not been a diabetic but his last hemoglobin A1c was 6 in March 2015. There is documentation that he's had endovenous ablation of bilateral varicose veins but we have no documentation about this and this may be done at least 4-5 years ago. 01/22/2015 -- he has his vascular test is scheduled for this afternoon. 01/29/2015 -- the patient's vascular test was canceled last week because he was unable to get onto the examining bed at [facility]. His Unna's boot was not applied either and the patient had a dressing placed by home health. Today when his dressing was removed we found a lot of maggots in his right lower extremity. 02/07/2015 -- Was seen in the vascular office by the PA and she noted that a bilateral venous duplex study did not show any DVT, SVT or reflux bilaterally. The patient was advised to continue with Unna's boot and would at some stage to require graduated compression stockings of the 20-30 mmHg variety. In the future lymphedema pumps would also be considered. 02/14/2015 -- his dressing is smelling quite a bit and there is a discoloration of the wound suggestive of an infection. Deep tissue cultures will be taken today. 02/21/2015 -- the culture is back and it has growth moderate growth of Proteus and gram-negative rods sensitive to sulfa, ampicillin, cephazolin, Zosyn. He is already on doxycycline and his wound is looking clean and hence we will continue with this. 02/28/2015 -- he's been doing fine still on his antibiotics and has no fresh issues. 03/07/2015 -- I was told that because he lives in a nursing home the Apligraf was  denied by his insurance company. 03/14/2015 -- we are awaiting samples and a trial of Puraply to be tried for his ulceration. 03/21/2015 -- his Apligraf was approved and he is here for his first application of Apligraf 03/28/2015 -- his wound has had quite a bit of secretion and needs to be changed and hence I will use the AHYAN, [name] ([id number]) second application of Apligraf. 04/04/2015 -- he is here for a wound check but has a lot of secretions and hence his dressing needs to be taken down. 04/09/2015 -- he is here for his third application of Apligraf 04/18/2015 -- he is here for a wound check and though he does not have any overt infection he always has a foul odor to his leg. 04/25/2015 -- his wound has been doing much better and he has minimal drainage and the size is smaller. Of note he is on doxycycline. 05/02/2015 -- he is here for his fourth application of Apligraf 05/09/2015 -- he is here for a wound check and is bothered by a lot of odor from the wound. 05/16/2015 -- he has done very well, the odor and drainage is less with the wound dressing having been changed twice this week. He is here for his last application of Apligraf. 06/20/2015 -- an area superior to the previous wound has now opened up and this is a fairly superficial ulceration. 07/12/2015 -- he is here for his first application of Puraply. This is a sample and this is no charge. 07/18/2015 -- he is here for his second application of Puraply. This is a sample and this is no charge. 07/25/2015 -- he is here for his third application of Puraply. This is a sample and this is no charge. 08/01/2015 -- he is here for his fourth application of Puraply. This is a sample and this is no charge. Electronic Signature(s) Signed: 08/01/2015  3:55:40 PM By: Evlyn Kanner MD, FACS Entered By: Evlyn Kanner on 08/01/2015 15:55:40 Leontae, Bostock Chrissie [name]  ([id number]) -------------------------------------------------------------------------------- Physical Exam Details Patient Name: [name], [name] Date of Service: 08/01/2015 3:30 PM Medical Record Number: [id number] Patient Account Number: [id number] Date of Birth/Sex: [birth year] (80 y.o. Male) Treating RN: Leonard Downing Primary Care Physician: Dorothey Baseman Other Clinician: Referring Physician: Dorothey Baseman Treating Physician/Extender: Rudene Re in Treatment: 28 Constitutional . Pulse regular. Respirations normal and unlabored. Afebrile. . Eyes Nonicteric. Reactive to light. Ears, Nose, Mouth, and Throat Lips, teeth, and gums WNL.Marland Kitchen Moist mucosa without lesions. Neck supple and nontender. No palpable supraclavicular or cervical adenopathy. Normal sized without goiter. Respiratory WNL. No retractions.. Cardiovascular Pedal Pulses WNL. No clubbing, cyanosis or edema. Lymphatic No adneopathy. No adenopathy. No adenopathy. Musculoskeletal Adexa without tenderness or enlargement.. Digits and nails w/o clubbing, cyanosis, infection, petechiae, ischemia, or inflammatory conditions.. Integumentary (Hair, Skin) No suspicious lesions. No crepitus or fluctuance. No peri-wound warmth or erythema. No masses.Marland Kitchen Psychiatric Judgement and insight Intact.. No evidence of depression, anxiety, or agitation.. Notes the wound is clean with minimal debris which was partially from the skin substitute. This was sharply removed with moist saline gauze and he is ready for his next application of Puraply. Electronic Signature(s) Signed: 08/01/2015 3:56:34 PM By: Evlyn Kanner MD, FACS Entered By: Evlyn Kanner on 08/01/2015 15:56:34 [name], [name] ([id number]) -------------------------------------------------------------------------------- Physician Orders Details Patient Name: [name], [name] Date of Service: 08/01/2015 3:30 PM Medical Record Number: [id number] Patient  Account Number: [id number] Date of Birth/Sex: 1936-02-01 (80 y.o. 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 Skin Barriers/Peri-Wound Care Wound #5 Right,Medial Lower Leg o Moisturizing lotion Secondary Dressing Wound #5 Right,Medial Lower Leg o ABD pad - Carboflex and ABD; gauze to bolster o Mepitel One - over PuraPly Dressing Change Frequency Wound #5 Right,Medial Lower Leg o Change dressing every week Follow-up Appointments Wound #5 Right,Medial Lower Leg o Return Appointment in 1 week. Edema Control Wound #5 Right,Medial Lower Leg o 4-Layer Compression System - Right Lower Extremity [name], [name] ([id number]) Notes PuraPly applied by MD in clinic today Electronic Signature(s) Signed: 08/01/2015 4:27:41 PM By: Evlyn Kanner MD, FACS Signed: 08/01/2015 5:48:41 PM By: Curtis Sites Entered By: Curtis Sites on 08/01/2015 16:16:16 [name], [name] ([id number]) -------------------------------------------------------------------------------- Problem List Details Patient Name: [name], [name] Date of Service: 08/01/2015 3:30 PM Medical Record Number: [id number] Patient Account Number: [id number] Date of Birth/Sex: [birth year] (80 y.o. 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s of 01/15/2015 Yes muscle E66.01 Morbid (severe) obesity due to excess calories 01/15/2015 Yes I89.0 Lymphedema, not elsewhere classified 02/07/2015 Yes Inactive Problems Resolved Problems Electronic Signature(s) Signed: 08/01/2015 4:22:46 PM By: Evlyn Kanner MD,  FACS Previous Signature: 08/01/2015 3:53:58 PM Version By: Evlyn Kanner MD, FACS Entered By: Evlyn Kanner on 08/01/2015 16:22:46 [name], OBERHOLZER ([id number]) -------------------------------------------------------------------------------- Progress Note Details Patient Name: BALDOMERO, MIRARCHI. Date of Service: 08/01/2015 3:30 PM Medical Record Number: [id number] Patient Account Number: [id number] Date of Birth/Sex: 1936-03-20 (80 y.o. Male) Treating RN: Leonard Downing Primary Care Physician: Dorothey Baseman Other Clinician: Referring Physician: Dorothey Baseman Treating Physician/Extender: Rudene Re in Treatment: 28 Subjective Chief Complaint Information obtained from Patient Patient presents for treatment of an open ulcer due to venous insufficiency. The patient has had a open wound on his right medial ankle for at least 3 years and was last seen in the wound clinic in February 2015. He was lost to follow-up after that. History of Present Illness (HPI) The following HPI elements were documented for the patient's wound: Location: right lower extremity ulceration Quality: Patient reports experiencing a dull pain to affected area(s). Severity: Patient states wound are getting worse. 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 Modifying Factors: Consults to this date include:has received treatment in the wound center in the past but stopped coming here since February 2015 Associated Signs and Symptoms: Patient reports having difficulty standing for long periods. 80 year old male who is known to have progressive weakness and has been in a nursing home for a while has had chronic lower extremity edema both legs and a large open wound on the right lower extremity which is has at least for about 3-4 years. The patient was seen in the wound clinic before and has been treated until February 2015 when he was lost to follow-up. Past medical history is significant for hypertension, gout, venous stasis ulcers, Parkinson's [name], constipation. He's not been a diabetic but his last hemoglobin A1c was 6 in March 2015. There is documentation that he's had endovenous ablation of bilateral varicose veins but we have no documentation about this and this may be done at least 4-5 years ago. 01/22/2015 -- he has his vascular test is scheduled for this afternoon. 01/29/2015 -- the patient's vascular test was canceled last week because he was unable to get onto the examining bed at [facility]. His Unna's boot was not applied either and the patient had a dressing placed by home health. Today when his dressing was removed we found a lot of maggots in his right lower extremity. 02/07/2015 -- Was seen in the vascular office by the PA and she noted that a bilateral venous duplex study did not show any DVT, SVT or reflux bilaterally. The patient was advised to continue with Unna's boot and would at some stage to require graduated compression stockings of the 20-30 mmHg variety. In the  future lymphedema pumps would also be considered. 02/14/2015 -- his dressing is smelling quite a bit and there is a discoloration of the wound suggestive of an ETHELBERT, THAIN. ([id number]) infection. Deep tissue cultures will be taken today. 02/21/2015 -- the culture is back and it has growth moderate growth of Proteus and gram-negative rods sensitive to sulfa, ampicillin, cephazolin, Zosyn. He is already on doxycycline and his wound is looking clean and hence we will continue with this. 02/28/2015 -- he's been  doing fine still on his antibiotics and has no fresh issues. 03/07/2015 -- I was told that because he lives in a nursing home the Apligraf was denied by his insurance company. 03/14/2015 -- we are awaiting samples and a trial of Puraply to be tried for his ulceration. 03/21/2015 -- his Apligraf was approved and he is here for his first application of Apligraf 03/28/2015 -- his wound has had quite a bit of secretion and needs to be changed and hence I will use the second application of Apligraf. 04/04/2015 -- he is here for a wound check but has a lot of secretions and hence his dressing needs to be taken down. 04/09/2015 -- he is here for his third application of Apligraf 04/18/2015 -- he is here for a wound check and though he does not have any overt infection he always has a foul odor to his leg. 04/25/2015 -- his wound has been doing much better and he has minimal drainage and the size is smaller. Of note he is on doxycycline. 05/02/2015 -- he is here for his fourth application of Apligraf 05/09/2015 -- he is here for a wound check and is bothered by a lot of odor from the wound. 05/16/2015 -- he has done very well, the odor and drainage is less with the wound dressing having been changed twice this week. He is here for his last application of Apligraf. 06/20/2015 -- an area superior to the previous wound has now opened up and this is a fairly  superficial ulceration. 07/12/2015 -- he is here for his first application of Puraply. This is a sample and this is no charge. 07/18/2015 -- he is here for his second application of Puraply. This is a sample and this is no charge. 07/25/2015 -- he is here for his third application of Puraply. This is a sample and this is no charge. 08/01/2015 -- he is here for his fourth application of Puraply. This is a sample and this is no charge. Objective Constitutional Pulse regular. Respirations normal and unlabored. Afebrile. Vitals Time Taken: 3:38 PM, Height: 68 in, Temperature: 97.8 F, Pulse: 72 bpm, Blood Pressure: 132/68 mmHg. Eyes Nonicteric. Reactive to light. Ears, Nose, Mouth, and Throat SHANTE, DENTE. ([id number]) Lips, teeth, and gums WNL.Marland Kitchen Moist mucosa without lesions. Neck supple and nontender. No palpable supraclavicular or cervical adenopathy. Normal sized without goiter. Respiratory WNL. No retractions.. Cardiovascular Pedal Pulses WNL. No clubbing, cyanosis or edema. Lymphatic No adneopathy. No adenopathy. No adenopathy. Musculoskeletal Adexa without tenderness or enlargement.. Digits and nails w/o clubbing, cyanosis, infection, petechiae, ischemia, or inflammatory conditions.Marland Kitchen Psychiatric Judgement and insight Intact.. No evidence of depression, anxiety, or agitation.. General Notes: the wound is clean with minimal debris which was partially from the skin substitute. This was sharply removed with moist saline gauze and he is ready for his next application of Puraply. Integumentary (Hair, Skin) No suspicious lesions. No crepitus or fluctuance. No peri-wound warmth or erythema. No masses.. Wound #5 status is Open. Original cause of wound was Gradually Appeared. The wound is located on the Right,Medial Lower Leg. The wound measures 4.5cm length x 1.6cm width x 0.2cm depth; 5.655cm^2 area and 1.131cm^3 volume. The wound is limited to skin breakdown. There is no tunneling  or undermining noted. There is a medium amount of serosanguineous drainage noted. The wound margin is distinct with the outline attached to the wound base. There is medium (34-66%) pink, pale granulation within the wound bed. There is a small (1-33%) amount of necrotic  tissue within the wound bed including Adherent Slough. The periwound skin appearance exhibited: Moist. The periwound skin appearance did not exhibit: Callus, Crepitus, Excoriation, Fluctuance, Friable, Induration, Localized Edema, Rash, Scarring, Dry/Scaly, Maceration, Atrophie Blanche, Cyanosis, Ecchymosis, Hemosiderin Staining, Mottled, Pallor, Rubor, Erythema. Periwound temperature was noted as No Abnormality. 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 ([id number]) I89.0 - Lymphedema, not elsewhere classified after appropriately debriding the necrotic debris around the wound base with moist saline gauze his wound is clean. He has had his fourth application of Puraply applied and we will then bolstered in place and apply a 4-layer Profore. He will be back to see as next week. Procedures Wound #5 Wound #5 is a Venous Leg Ulcer located on the Right,Medial Lower Leg. A skin graft procedure using a bioengineered skin substitute/cellular or tissue based product was performed by Evlyn Kanner, MD. Other was applied and secured with Steri-Strips. 7 sq cm of product was utilized and 47 sq cm was wasted due to wound size. Post Application, mepitel one was applied. A Time Out was conducted prior to the start of the procedure. The procedure was tolerated well with a pain level of 0 throughout and a pain level of 0 following the procedure. Post procedure Diagnosis Wound #5: Same as Pre-Procedure General Notes:  Puraply was used. Plan Wound Cleansing: Wound #5 Right,Medial Lower Leg: Cleanse wound with mild soap and water No tub bath. Skin Barriers/Peri-Wound Care: Wound #5 Right,Medial Lower Leg: Moisturizing lotion Secondary Dressing: Wound #5 Right,Medial Lower Leg: ABD pad - Carboflex and ABD; gauze to bolster Mepitel One - over PuraPly Dressing Change Frequency: Wound #5 Right,Medial Lower Leg: Change dressing every week Follow-up Appointments: Wound #5 Right,Medial Lower Leg: Return Appointment in 1 week. Edema Control: CHARBEL, LOS ([id number]) Wound #5 Right,Medial Lower Leg: 4-Layer Compression System - Right Lower Extremity General Notes: PuraPly applied by MD in clinic today after appropriately debriding the necrotic debris around the wound base with moist saline gauze his wound is clean. He has had his fourth application of Puraply applied and we will then bolstered in place and apply a 4-layer Profore. He will be back to see as next week. Electronic Signature(s) Signed: 08/02/2015 10:51:12 AM By: Evlyn Kanner MD, FACS Previous Signature: 08/01/2015 4:25:16 PM Version By: Evlyn Kanner MD, FACS Entered By: Evlyn Kanner on 08/02/2015 10:51:12 JANARD, CULP ([id number]) -------------------------------------------------------------------------------- SuperBill Details Patient Name: ADAIR, LAUDERBACK. Date of Service: 08/01/2015 Medical Record Number: [id number] Patient Account Number: [id number] Date of Birth/Sex: 05-Nov-1935 (80 y.o. 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 E66.01 Morbid (severe) obesity  due to excess calories I89.0 Lymphedema, not elsewhere classified Facility Procedures CPT4 Code Description: [id number] - SKIN SUB GRAFT FACE/NK/HF/G ICD-10 Description Diagnosis I87.311 Chronic venous hypertension (idiopathic) with ulcer of 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 E66.01 Morbid (severe) obesity  due to excess calories I89.0 Lymphedema, not elsewhere classified Modifier: right lower s of muscle Quantity: 1 extremity Physician Procedures CPT4 Code Description: [id number] - WC PHYS SKIN SUB GRAFT FACE/NK/HF/G ICD-10  Description Diagnosis I87.311 Chronic venous hypertension (idiopathic) with ulcer of 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s E66.01 Morbid (severe)  obesity due to excess calories I89.0 Lymphedema, not elsewhere classified Modifier: right lower of muscle Quantity: 1 extremity Electronic Signature(s) Signed: 08/01/2015 4:26:18 PM By: Evlyn Kanner MD, FACS Previous Signature: 08/01/2015 4:25:32 PM Version By: Evlyn Kanner MD, FACS Entered By: Evlyn Kanner on 08/01/2015 16:26:17

## 2015-08-02 NOTE — Progress Notes (Signed)
[name] ([id number]) Visit Report for 08/01/2015 Arrival Information Details Patient Name: [name], [name]. Date of Service: 08/01/2015 3:30 PM Medical Record Number: [id number] Patient Account Number: [id number] Date of Birth/Sex: [birth year] (80 y.o. Male) Treating RN: Leonard Downing Primary Care Physician: Dorothey Baseman Other Clinician: Referring Physician: Terance Hart, DAVID Treating Physician/Extender: Rudene Re in Treatment: 28 Visit Information History Since Last Visit All ordered tests and consults were completed: No Patient Arrived: Wheel Chair Added or deleted any medications: No Arrival Time: 15:39 Any new allergies or adverse reactions: No Accompanied By: self Had a fall or experienced change in No activities of daily living that may affect Transfer Assistance: Other risk of falls: Patient Identification Verified: Yes Signs or symptoms of abuse/neglect since last No Secondary Verification Process Yes visito Completed: Hospitalized since last visit: No Patient Requires Transmission-Based No Has Dressing in Place as Prescribed: Yes Precautions: Has Compression in Place as Prescribed: Yes Patient Has Alerts: No Pain Present Now: No Notes stays in wheelchair Electronic Signature(s) Signed: 08/01/2015 4:29:37 PM By: [name] Entered By: [name] on 08/01/2015 15:39:51 [name] [name] ([id number]) -------------------------------------------------------------------------------- Encounter Discharge Information Details Patient Name: [name] Date of Service: 08/01/2015 3:30 PM Medical Record Number: [id number] Patient Account Number: [id number] Date of Birth/Sex: 05-Sep-1935 (80 y.o. Male) Treating RN: Leonard Downing Primary Care Physician: Dorothey Baseman Other Clinician: Referring Physician: Dorothey Baseman Treating Physician/Extender: Rudene Re in Treatment: 28 Encounter Discharge Information  Items Discharge Pain Level: 0 Discharge Condition: Stable Ambulatory Status: Wheelchair Discharge Destination: Nursing Home Transportation: Other Accompanied By: self Schedule Follow-up Appointment: Yes Medication Reconciliation completed and provided to Patient/Care Yes [name]: Provided on Clinical Summary of Care: 08/01/2015 Form Type Recipient Paper Patient [facility] Electronic Signature(s) Signed: 08/01/2015 4:29:37 PM By: [name] Previous Signature: 08/01/2015 4:16:59 PM Version By: Gwenlyn Perking Entered By: [name] on 08/01/2015 16:18:23 [name] ([id number]) -------------------------------------------------------------------------------- Lower Extremity Assessment Details Patient Name: [name] Date of Service: 08/01/2015 3:30 PM Medical Record Number: [id number] Patient Account Number: [id number] Date of Birth/Sex: [birth year] (80 y.o. Male) Treating RN: Leonard Downing Primary Care Physician: Dorothey Baseman Other Clinician: Referring Physician: Terance Hart, DAVID Treating Physician/Extender: Rudene Re in Treatment: 28 Edema Assessment Assessed: [Left: No] [Right: No] E[Left: dema] [Right: :] Calf Left: Right: Point of Measurement: 40 cm From Medial Instep cm 46 cm Ankle Left: Right: Point of Measurement: 12 cm From Medial Instep cm 25.5 cm Vascular Assessment Pulses: Posterior Tibial Dorsalis Pedis Palpable: [Right:Yes] Extremity colors, hair growth, and conditions: Extremity Color: [Right:Mottled] Hair Growth on Extremity: [Right:No] Temperature of Extremity: [Right:Warm] Capillary Refill: [Right:< 3 seconds] Dependent Rubor: [Right:No] Blanched when Elevated: [Right:No] Lipodermatosclerosis: [Right:No] Toe Nail Assessment Left: Right: Thick: Yes Discolored: No Deformed: No Improper Length and Hygiene: No Electronic Signature(s) Signed: 08/01/2015 4:29:37 PM By: [name] Starch RN, [name]  ([id number]) Entered By: [name] on 08/01/2015 15:54:00 [name] [name] ([id number]) -------------------------------------------------------------------------------- Multi Wound Chart Details Patient Name: [name] Date of Service: 08/01/2015 3:30 PM Medical Record Number: [id number] Patient Account Number: [id number] Date of Birth/Sex: [birth year] (80 y.o. Male) Treating RN: Curtis Sites Primary Care Physician: Dorothey Baseman Other Clinician: Referring Physician: Terance Hart, DAVID Treating Physician/Extender: Rudene Re in Treatment: 28 Vital Signs Height(in): 68 Pulse(bpm): 72 Weight(lbs): Blood Pressure 132/68 (mmHg): Body Mass Index(BMI): Temperature(F): 97.8 Respiratory Rate (breaths/min): Photos: [5:No Photos] [N/A:N/A] Wound Location: [5:Right Lower Leg - Medial] [N/A:N/A] Wounding Event: [5:Gradually Appeared] [N/A:N/A] Primary Etiology: [5:Venous Leg  Ulcer] [N/A:N/A] Comorbid History: [5:Cataracts, Anemia, Hypertension, Osteoarthritis] [N/A:N/A] Date Acquired: [5:11/13/2014] [N/A:N/A] Weeks of Treatment: [5:28] [N/A:N/A] Wound Status: [5:Open] [N/A:N/A] Measurements L x W x D 4.5x1.6x0.2 [N/A:N/A] (cm) Area (cm) : [5:5.655] [N/A:N/A] Volume (cm) : [5:1.131] [N/A:N/A] % Reduction in Area: [5:94.50%] [N/A:N/A] % Reduction in Volume: 96.30% [N/A:N/A] Classification: [5:Full Thickness Without Exposed Support Structures] [N/A:N/A] Exudate Amount: [5:Medium] [N/A:N/A] Exudate Type: [5:Serosanguineous] [N/A:N/A] Exudate Color: [5:red, brown] [N/A:N/A] Wound Margin: [5:Distinct, outline attached] [N/A:N/A] Granulation Amount: [5:Medium (34-66%)] [N/A:N/A] Granulation Quality: [5:Pink, Pale] [N/A:N/A] Necrotic Amount: [5:Small (1-33%)] [N/A:N/A] Exposed Structures: [5:Fascia: No Fat: No Tendon: No Muscle: No] [N/A:N/A] Joint: No Bone: No Limited to Skin Breakdown Epithelialization: Medium (34-66%) N/A N/A Periwound Skin  Texture: Edema: No N/A N/A Excoriation: No Induration: No Callus: No Crepitus: No Fluctuance: No Friable: No Rash: No Scarring: No Periwound Skin Moist: Yes N/A N/A Moisture: Maceration: No Dry/Scaly: No Periwound Skin Color: Atrophie Blanche: No N/A N/A Cyanosis: No Ecchymosis: No Erythema: No Hemosiderin Staining: No Mottled: No Pallor: No Rubor: No Temperature: No Abnormality N/A N/A Tenderness on No N/A N/A Palpation: Wound Preparation: Ulcer Cleansing: Other: N/A N/A soap and water Topical Anesthetic Applied: None, Other: lidocaine 4% Treatment Notes Electronic Signature(s) Signed: 08/01/2015 5:48:41 PM By: Curtis Sites Entered By: Curtis Sites on 08/01/2015 16:12:53 [name] ([id number]) -------------------------------------------------------------------------------- Multi-Disciplinary Care Plan Details Patient Name: [name], [name]. Date of Service: 08/01/2015 3:30 PM Medical Record Number: [id number] Patient Account Number: [id number] Date of Birth/Sex: 1936-05-16 (80 y.o. Male) Treating RN: Curtis Sites Primary Care Physician: Dorothey Baseman Other Clinician: Referring Physician: Dorothey Baseman Treating Physician/Extender: Rudene Re in Treatment: 64 Active Inactive Orientation to the Wound Care Program Nursing Diagnoses: Knowledge deficit related to the wound healing center program Goals: Patient/caregiver will verbalize understanding of the Wound Healing Center Program Date Initiated: 01/15/2015 Goal Status: Active Interventions: Provide education on orientation to the wound center Notes: Venous Leg Ulcer Nursing Diagnoses: Knowledge deficit related to disease process and management Potential for venous Insuffiency (use before diagnosis confirmed) Goals: Patient will maintain optimal edema control Date Initiated: 01/15/2015 Goal Status: Active Patient/caregiver will verbalize understanding of disease process and  disease management Date Initiated: 01/15/2015 Goal Status: Active Verify adequate tissue perfusion prior to therapeutic compression application Date Initiated: 01/15/2015 Goal Status: Active Interventions: Assess peripheral edema status every visit. Compression as ordered Provide education on venous insufficiency [name], [name] ([id number]) Treatment Activities: Test ordered outside of clinic : 08/01/2015 Therapeutic compression applied : 08/01/2015 Venous Duplex Doppler : 08/01/2015 Notes: Wound/Skin Impairment Nursing Diagnoses: Impaired tissue integrity Knowledge deficit related to ulceration/compromised skin integrity Goals: Patient/caregiver will verbalize understanding of skin care regimen Date Initiated: 01/15/2015 Goal Status: Active Ulcer/skin breakdown will have a volume reduction of 30% by week 4 Date Initiated: 01/15/2015 Goal Status: Active Ulcer/skin breakdown will have a volume reduction of 50% by week 8 Date Initiated: 01/15/2015 Goal Status: Active Ulcer/skin breakdown will have a volume reduction of 80% by week 12 Date Initiated: 01/15/2015 Goal Status: Active Ulcer/skin breakdown will heal within 14 weeks Date Initiated: 01/15/2015 Goal Status: Active Interventions: Assess patient/caregiver ability to perform ulcer/skin care regimen upon admission and as needed Assess ulceration(s) every visit Provide education on ulcer and skin care Notes: Electronic Signature(s) Signed: 08/01/2015 5:48:41 PM By: Curtis Sites Entered By: Curtis Sites on 08/01/2015 16:10:34 [name] ([id number]) -------------------------------------------------------------------------------- Pain Assessment Details Patient Name: [name] Date of Service: 08/01/2015 3:30 PM Medical Record Number: [id number] Patient Account Number: [id number] Date of Birth/Sex:  10-Dec-1935 (80 y.o. Male) Treating RN: Leonard Downing Primary Care Physician: Dorothey Baseman Other  Clinician: Referring Physician: Dorothey Baseman Treating Physician/Extender: Rudene Re in Treatment: 28 Active Problems Location of Pain Severity and Description of Pain Patient Has Paino No Site Locations Rate the pain. Current Pain Level: 0 Pain Management and Medication Current Pain Management: Electronic Signature(s) Signed: 08/01/2015 4:29:37 PM By: [name] Starch, RN, [name] Entered By: [name] on 08/01/2015 15:39:58 [name], Stanard [name] ([id number]) -------------------------------------------------------------------------------- Patient/Caregiver Education Details Patient Name: CRIT, SCHOR. Date of Service: 08/01/2015 3:30 PM Medical Record Number: [id number] Patient Account Number: [id number] Date of Birth/Gender: [birth year] (80 y.o. Male) Treating RN: Leonard Downing Primary Care Physician: Dorothey Baseman Other Clinician: Referring Physician: Dorothey Baseman Treating Physician/Extender: Rudene Re in Treatment: 35 Education Assessment Education Provided To: Patient Education Topics Provided Venous: Handouts: Controlling Swelling with Multilayered Compression Wraps Methods: Explain/Verbal Responses: State content correctly Wound/Skin Impairment: Handouts: Other: puraply skin sub Methods: Explain/Verbal Responses: State content correctly Electronic Signature(s) Signed: 08/01/2015 4:29:37 PM By: [name] Starch, RN, [name] Entered By: [name] on 08/01/2015 16:18:42 [name], Bicksler [name] ([id number]) -------------------------------------------------------------------------------- Wound Assessment Details Patient Name: [name] Date of Service: 08/01/2015 3:30 PM Medical Record Number: [id number] Patient Account Number: [id number] Date of Birth/Sex: 06-Mar-1936 (81 y.o. Male) Treating RN: Leonard Downing Primary Care Physician: Dorothey Baseman Other Clinician: Referring Physician: Terance Hart, DAVID Treating  Physician/Extender: Rudene Re in Treatment: 28 Wound Status Wound Number: 5 Primary Venous Leg Ulcer Etiology: Wound Location: Right Lower Leg - Medial Wound Status: Open Wounding Event: Gradually Appeared Comorbid Cataracts, Anemia, Hypertension, Date Acquired: 11/13/2014 History: Osteoarthritis Weeks Of Treatment: 28 Clustered Wound: No Photos Wound Measurements Length: (cm) 4.5 Width: (cm) 1.6 Depth: (cm) 0.2 Area: (cm) 5.655 Volume: (cm) 1.131 % Reduction in Area: 94.5% % Reduction in Volume: 96.3% Epithelialization: Medium (34-66%) Tunneling: No Undermining: No Wound Description Full Thickness Without Exposed Classification: Support Structures Wound Margin: Distinct, outline attached Exudate Medium Amount: Exudate Type: Serosanguineous Exudate Color: red, brown Foul Odor After Cleansing: No Wound Bed Granulation Amount: Medium (34-66%) Exposed Structure Granulation Quality: Pink, Pale Fascia Exposed: No Necrotic Amount: Small (1-33%) Fat Layer Exposed: No [name], [name] ([id number]) Necrotic Quality: Adherent Slough Tendon Exposed: No Muscle Exposed: No Joint Exposed: No Bone Exposed: No Limited to Skin Breakdown Periwound Skin Texture Texture Color No Abnormalities Noted: No No Abnormalities Noted: No Callus: No Atrophie Blanche: No Crepitus: No Cyanosis: No Excoriation: No Ecchymosis: No Fluctuance: No Erythema: No Friable: No Hemosiderin Staining: No Induration: No Mottled: No Localized Edema: No Pallor: No Rash: No Rubor: No Scarring: No Temperature / Pain Moisture Temperature: No Abnormality No Abnormalities Noted: No Dry / Scaly: No Maceration: No Moist: Yes Wound Preparation Ulcer Cleansing: Other: soap and water, Topical Anesthetic Applied: None, Other: lidocaine 4%, Treatment Notes Wound #5 (Right, Medial Lower Leg) 1. Cleansed with: Clean wound with Normal Saline 2. Anesthetic Topical Lidocaine 4%  cream to wound bed prior to debridement 3. Peri-wound Care: Barrier cream 4. Dressing Applied: Other dressing (specify in notes) 5. Secondary Dressing Applied Dry Gauze Contact layer 7. Secured with 4-Layer Compression System - Right Lower Extremity Notes puraply applied by Dr Meyer Russel today [name], [name] ([id number]) Electronic Signature(s) Signed: 08/01/2015 4:29:37 PM By: [name] Entered By: [name] on 08/01/2015 16:27:29 [name], [name] ([id number]) -------------------------------------------------------------------------------- Vitals Details Patient Name: [name], [name]. Date of Service: 08/01/2015 3:30 PM Medical Record Number: [id number] Patient Account Number: [id number] Date of Birth/Sex: 1935/08/19 (79  y.o. Male) Treating RN: Leonard Downing Primary Care Physician: Terance Hart, DAVID Other Clinician: Referring Physician: Terance Hart, DAVID Treating Physician/Extender: Rudene Re in Treatment: 28 Vital Signs Time Taken: 15:38 Temperature (F): 97.8 Height (in): 68 Pulse (bpm): 72 Blood Pressure (mmHg): 132/68 Reference Range: 80 - 120 mg / dl Electronic Signature(s) Signed: 08/01/2015 4:29:37 PM By: [name] Entered By: [name] on 08/01/2015 15:38:52

## 2015-08-08 ENCOUNTER — Encounter: Payer: Medicare Other | Attending: Surgery | Admitting: Surgery

## 2015-08-08 DIAGNOSIS — I10 Essential (primary) hypertension: Secondary | ICD-10-CM | POA: Insufficient documentation

## 2015-08-08 DIAGNOSIS — G20 Parkinson's disease: Secondary | ICD-10-CM | POA: Diagnosis not present

## 2015-08-08 DIAGNOSIS — I87311 Chronic venous hypertension (idiopathic) with ulcer of right lower extremity: Secondary | ICD-10-CM | POA: Diagnosis present

## 2015-08-08 DIAGNOSIS — L97313 Non-pressure chronic ulcer of right ankle with necrosis of muscle: Secondary | ICD-10-CM | POA: Insufficient documentation

## 2015-08-08 DIAGNOSIS — I890 Lymphedema, not elsewhere classified: Secondary | ICD-10-CM | POA: Diagnosis not present

## 2015-08-09 NOTE — Progress Notes (Addendum)
DASHAWN, BARTNICK ([id number]) Visit Report for 08/08/2015 Chief Complaint Document Details Patient Name: [name], [name] Date of Service: 08/08/2015 2:45 PM Medical Record Number: [id number] Patient Account Number: [id number] Date of Birth/Sex: Oct 02, 1935 (80 y.o. Male) Treating RN: Curtis Sites Primary Care Physician: Dorothey Baseman Other Clinician: Referring Physician: Dorothey Baseman Treating Physician/Extender: Rudene Re in Treatment: 29 Information Obtained from: Patient Chief Complaint Patient presents for treatment of an open ulcer due to venous insufficiency. The patient has had a open wound on his right medial ankle for at least 3 years and was last seen in the wound clinic in February 2015. He was lost to follow-up after that. Electronic Signature(s) Signed: 08/08/2015 2:54:12 PM By: Evlyn Kanner MD, FACS Entered By: Evlyn Kanner on 08/08/2015 14:54:12 Ivon, [place] Chrissie Noa ([id number]) -------------------------------------------------------------------------------- Cellular or Tissue Based Product Details Patient Name: [name], [name]. Date of Service: 08/08/2015 2:45 PM Medical Record Number: [id number] Patient Account Number: [id number] Date of Birth/Sex: [birth year] (80 y.o. Male) Treating RN: Curtis Sites Primary Care Physician: Dorothey Baseman Other Clinician: Referring Physician: Dorothey Baseman Treating Physician/Extender: Rudene Re in Treatment: 29 Cellular or Tissue Based Wound #5 Right,Medial Lower Leg Product Type Applied to: Performed By: Physician Evlyn Kanner, MD Cellular or Tissue Based Other Product Type: Time-Out Taken: Yes Location: genitalia / hands / feet / multiple digits Wound Size (sq cm): 7.82 Product Size (sq cm): 54 Waste Size (sq cm): 46 Waste Reason: wound size Amount of Product Applied (sq cm): 8 Lot #: [id number] Expiration Date: 05/10/2017 Fenestrated: No Reconstituted: Yes Solution Type:  saline Solution Amount: 2ml Lot #: [id number] Solution Expiration 04/18/2017 Date: Secured: Yes Secured With: Steri-Strips Dressing Applied: Yes Primary Dressing: mepitel one Procedural Pain: 0 Post Procedural Pain: 0 Response to Treatment: Procedure was tolerated well Post Procedure Diagnosis Same as Pre-procedure Electronic Signature(s) Signed: 08/08/2015 3:08:22 PM By: Evlyn Kanner MD, FACS Entered By: Evlyn Kanner on 08/08/2015 15:08:21 NASIF, BOS ([id number]) -------------------------------------------------------------------------------- HPI Details Patient Name: [name] Date of Service: 08/08/2015 2:45 PM Medical Record Number: [id number] Patient Account Number: [id number] Date of Birth/Sex: 06-29-1936 (80 y.o. Male) Treating RN: Curtis Sites Primary Care Physician: Dorothey Baseman Other Clinician: Referring Physician: Dorothey Baseman Treating Physician/Extender: Rudene Re in Treatment: 29 History of Present Illness Location: right lower extremity ulceration Quality: Patient reports experiencing a dull pain to affected area(s). Severity: Patient states wound are getting worse. 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 Modifying Factors: Consults to this date include:has received treatment in the wound center in the past but stopped coming here since February 2015 Associated Signs and Symptoms: Patient reports having difficulty standing for long periods. HPI Description: 80 year old male who is known to have progressive weakness and has been in a nursing home for a while has had chronic lower extremity edema both legs and a large open wound on the right lower  extremity which is has at least for about 3-4 years. The patient was seen in the wound clinic before and has been treated until February 2015 when he was lost to follow-up. Past medical history is significant for hypertension, gout, venous stasis ulcers, Parkinson's [name], constipation. He's not been a diabetic  but his last hemoglobin A1c was 6 in March 2015. There is documentation that he's had endovenous ablation of bilateral varicose veins but we have no documentation about this and this may be done at least 4-5 years ago. 01/22/2015 -- he has his vascular test is scheduled for this afternoon. 01/29/2015 -- the patient's vascular test was canceled last week because he was unable to get onto the examining bed at [facility]. His Unna's boot was not applied either and the patient had a dressing placed by home health. Today when his dressing was removed we found a lot of maggots in his right lower extremity. 02/07/2015 -- Was seen in the vascular office by the PA and she noted that a bilateral venous duplex study did not show any DVT, SVT or reflux bilaterally. The patient was advised to continue with Unna's boot and would at some stage to require graduated compression stockings of the 20-30 mmHg variety. In the future lymphedema pumps would also be considered. 02/14/2015 -- his dressing is smelling quite a bit and there is a discoloration of the wound suggestive of an infection. Deep tissue cultures will be taken today. 02/21/2015 -- the culture is back and it has growth moderate growth of Proteus and gram-negative rods sensitive to sulfa, ampicillin, cephazolin, Zosyn. He is already on doxycycline and his wound is looking clean and hence we will continue with this. 02/28/2015 -- he's been doing fine still on his antibiotics and has no fresh issues. 03/07/2015 -- I was told that because he lives in a nursing home the Apligraf was denied by his insurance company. 03/14/2015 -- we are awaiting  samples and a trial of Puraply to be tried for his ulceration. 03/21/2015 -- his Apligraf was approved and he is here for his first application of Apligraf 03/28/2015 -- his wound has had quite a bit of secretion and needs to be changed and hence I will use the SHAINE, NEWMARK. ([id number]) second application of Apligraf. 04/04/2015 -- he is here for a wound check but has a lot of secretions and hence his dressing needs to be taken down. 04/09/2015 -- he is here for his third application of Apligraf 04/18/2015 -- he is here for a wound check and though he does not have any overt infection he always has a foul odor to his leg. 04/25/2015 -- his wound has been doing much better and he has minimal drainage and the size is smaller. Of note he is on doxycycline. 05/02/2015 -- he is here for his fourth application of Apligraf 05/09/2015 -- he is here for a wound check and is bothered by a lot of odor from the wound. 05/16/2015 -- he has done very well, the odor and drainage is less with the wound dressing having been changed twice this week. He is here for his last application of Apligraf. 06/20/2015 -- an area superior to the previous wound has now opened up and this is a fairly superficial ulceration. 07/12/2015 -- he is here for his first application of Puraply. This is a sample and this is no charge. 07/18/2015 -- he is here for his second application of Puraply. This is a sample and this is no charge. 07/25/2015 -- he is here for his third application of Puraply. This is a sample and this is no charge. 08/01/2015 -- he is here for his fourth application of Puraply. This is a sample and this is no charge. 08/08/2015 -- he is here for his fifth application of Puraply and  this is a sample at no charge. Electronic Signature(s) Signed: 08/08/2015 2:54:49 PM By: Evlyn Kanner MD, FACS Entered By: Evlyn Kanner on 08/08/2015 14:54:49 ISAAK, DELMUNDO  ([id number]) -------------------------------------------------------------------------------- Physical Exam Details Patient Name: MYLZ, YUAN. Date of Service: 08/08/2015 2:45 PM Medical Record Number: [id number] Patient Account Number: [id number] Date of Birth/Sex: 08-18-1935 (80 y.o. Male) Treating RN: Curtis Sites Primary Care Physician: Dorothey Baseman Other Clinician: Referring Physician: Terance Hart, DAVID Treating Physician/Extender: Rudene Re in Treatment: 29 Constitutional . Pulse regular. Respirations normal and unlabored. Afebrile. . Eyes Nonicteric. Reactive to light. Ears, Nose, Mouth, and Throat Lips, teeth, and gums WNL.Marland Kitchen Moist mucosa without lesions. Neck supple and nontender. No palpable supraclavicular or cervical adenopathy. Normal sized without goiter. Respiratory WNL. No retractions.. Cardiovascular Pedal Pulses WNL. No clubbing, cyanosis or edema. Lymphatic No adneopathy. No adenopathy. No adenopathy. Musculoskeletal Adexa without tenderness or enlargement.. Digits and nails w/o clubbing, cyanosis, infection, petechiae, ischemia, or inflammatory conditions.. Integumentary (Hair, Skin) No suspicious lesions. No crepitus or fluctuance. No peri-wound warmth or erythema. No masses.Marland Kitchen Psychiatric Judgement and insight Intact.. No evidence of depression, anxiety, or agitation.. Notes The wound is clean with minimal debris which was partially from the skin substitute. This was sharply removed with moist saline gauze and he is ready for his next application of Puraply. Electronic Signature(s) Signed: 08/08/2015 2:55:29 PM By: Evlyn Kanner MD, FACS Entered By: Evlyn Kanner on 08/08/2015 14:55:28 ALEXZAVIER, GIRARDIN ([id number]) -------------------------------------------------------------------------------- Physician Orders Details Patient Name: GLEN, KESINGER. Date of Service: 08/08/2015 2:45 PM Medical Record Number: [id number] Patient Account  Number: [id number] Date of Birth/Sex: 1936-04-30 (80 y.o. 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 Skin Barriers/Peri-Wound Care Wound #5 Right,Medial Lower Leg o Moisturizing lotion Secondary Dressing Wound #5 Right,Medial Lower Leg o ABD pad - Carboflex and ABD; gauze to bolster o Mepitel One - over PuraPly Dressing Change Frequency Wound #5 Right,Medial Lower Leg o Change dressing every week Follow-up Appointments Wound #5 Right,Medial Lower Leg o Return Appointment in 1 week. Edema Control Wound #5 Right,Medial Lower Leg o 4-Layer Compression System - Right Lower Extremity DRAYDEN, LUKAS ([id number]) Notes PuraPly applied by Dr Meyer Russel today Electronic Signature(s) Signed: 08/08/2015 3:59:20 PM By: Evlyn Kanner MD, FACS Signed: 08/08/2015 5:14:04 PM By: Curtis Sites Entered By: Curtis Sites on 08/08/2015 15:22:29 RONIN, REHFELDT ([id number]) -------------------------------------------------------------------------------- Problem List Details Patient Name: WOODY, KRONBERG. Date of Service: 08/08/2015 2:45 PM Medical Record Number: [id number] Patient Account Number: [id number] Date of Birth/Sex: 08/09/1935 (80 y.o. 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 Date of Service: 08/08/2015 2:45 PM Medical Record Number: [id number] Patient Account Number: [id number] Date of Birth/Sex: 02-26-1936 (80 y.o. Male) Treating RN: Curtis Sites Primary Care Physician: Dorothey Baseman Other Clinician: Referring Physician: Dorothey Baseman Treating Physician/Extender: Rudene Re in Treatment: 29 Subjective Chief Complaint Information obtained from Patient Patient presents for treatment of an open ulcer due to venous insufficiency. The patient has had a open wound on his right medial ankle for at least 3 years and was last seen in the wound clinic in February 2015. He was lost to follow-up after that. History of Present Illness (HPI) The following HPI elements were documented for the patient's wound: Location: right lower extremity ulceration Quality: Patient reports experiencing a dull pain to affected area(s). Severity: Patient states wound are getting worse. 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 Modifying Factors: Consults to this date include:has received treatment in the wound center in the past but stopped coming here since February 2015 Associated Signs and Symptoms: Patient reports having difficulty standing for long periods. 79 year old male who is known to have progressive weakness and has been in a nursing home for a while has had chronic lower extremity edema both legs and a large open wound on the right lower extremity which is has at least for about 3-4 years. The patient was seen in the wound clinic before and has been treated until February 2015 when he was lost to follow-up. Past medical history is significant for hypertension, gout, venous stasis ulcers, Parkinson's [name], constipation. He's not been a diabetic but his last hemoglobin A1c was 6 in March 2015. There is documentation that he's had endovenous ablation of bilateral varicose veins but we have no documentation about this and this may be done at least 4-5 years ago. 01/22/2015 -- he has his vascular test is scheduled for this afternoon. 01/29/2015 -- the patient's vascular test was canceled last week because he was unable to get onto the examining bed at [facility]. His Unna's boot was not applied either and the patient had a dressing placed by home health. Today when his dressing was removed we found a lot of maggots in his right lower extremity. 02/07/2015 -- Was seen in the vascular office by the PA and she noted that a bilateral venous duplex study did not show any DVT, SVT or reflux bilaterally. The patient was advised to continue with Unna's boot and would at some stage to require graduated compression stockings of the 20-30 mmHg variety. In the future lymphedema pumps would also be considered. 02/14/2015 -- his dressing is smelling  quite a bit and there is a discoloration of the wound suggestive of an OBDULIO, MASH. ([id number]) infection. Deep tissue cultures will be taken today. 02/21/2015 -- the culture is back and it has growth moderate growth of Proteus and gram-negative rods sensitive to sulfa, ampicillin, cephazolin, Zosyn. He is already on doxycycline and his wound is looking clean and hence we will continue with this. 02/28/2015 -- he's been doing  fine still on his antibiotics and has no fresh issues. 03/07/2015 -- I was told that because he lives in a nursing home the Apligraf was denied by his insurance company. 03/14/2015 -- we are awaiting samples and a trial of Puraply to be tried for his ulceration. 03/21/2015 -- his Apligraf was approved and he is here for his first application of Apligraf 03/28/2015 -- his wound has had quite a bit of secretion and needs to be changed and hence I will use the second application of Apligraf. 04/04/2015 -- he is here for a wound check but has a lot of secretions and hence his dressing needs to be taken down. 04/09/2015 -- he is here for his third application of Apligraf 04/18/2015 -- he is here for a wound check and though he does not have any overt infection he always has a foul odor to his leg. 04/25/2015 -- his wound has been doing much better and he has minimal drainage and the size is smaller. Of note he is on doxycycline. 05/02/2015 -- he is here for his fourth application of Apligraf 05/09/2015 -- he is here for a wound check and is bothered by a lot of odor from the wound. 05/16/2015 -- he has done very well, the odor and drainage is less with the wound dressing having been changed twice this week. He is here for his last application of Apligraf. 06/20/2015 -- an area superior to the previous wound has now opened up and this is a fairly superficial ulceration. 07/12/2015 -- he is here for his first application of Puraply. This is a sample and this is no  charge. 07/18/2015 -- he is here for his second application of Puraply. This is a sample and this is no charge. 07/25/2015 -- he is here for his third application of Puraply. This is a sample and this is no charge. 08/01/2015 -- he is here for his fourth application of Puraply. This is a sample and this is no charge. 08/08/2015 -- he is here for his fifth application of Puraply and this is a sample at no charge. Objective Constitutional Pulse regular. Respirations normal and unlabored. Afebrile. Vitals Time Taken: 2:41 PM, Height: 68 in, Temperature: 97.6 F, Pulse: 68 bpm, Respiratory Rate: 18 breaths/min, Blood Pressure: 148/62 mmHg. Eyes Nonicteric. Reactive to light. BOWIE, DELIA ([id number]) Ears, Nose, Mouth, and Throat Lips, teeth, and gums WNL.Marland Kitchen Moist mucosa without lesions. Neck supple and nontender. No palpable supraclavicular or cervical adenopathy. Normal sized without goiter. Respiratory WNL. No retractions.. Cardiovascular Pedal Pulses WNL. No clubbing, cyanosis or edema. Lymphatic No adneopathy. No adenopathy. No adenopathy. Musculoskeletal Adexa without tenderness or enlargement.. Digits and nails w/o clubbing, cyanosis, infection, petechiae, ischemia, or inflammatory conditions.Marland Kitchen Psychiatric Judgement and insight Intact.. No evidence of depression, anxiety, or agitation.. General Notes: The wound is clean with minimal debris which was partially from the skin substitute. This was sharply removed with moist saline gauze and he is ready for his next application of Puraply. Integumentary (Hair, Skin) No suspicious lesions. No crepitus or fluctuance. No peri-wound warmth or erythema. No masses.. Wound #5 status is Open. Original cause of wound was Gradually Appeared. The wound is located on the Right,Medial Lower Leg. The wound measures 4.6cm length x 1.7cm width x 0.2cm depth; 6.142cm^2 area and 1.228cm^3 volume. The wound is limited to skin breakdown. There is no  tunneling or undermining noted. There is a medium amount of serosanguineous drainage noted. The wound margin is distinct with the outline attached to  the wound base. There is medium (34-66%) pink, pale granulation within the wound bed. There is a small (1-33%) amount of necrotic tissue within the wound bed including Adherent Slough. The periwound skin appearance exhibited: Moist. The periwound skin appearance did not exhibit: Callus, Crepitus, Excoriation, Fluctuance, Friable, Induration, Localized Edema, Rash, Scarring, Dry/Scaly, Maceration, Atrophie Blanche, Cyanosis, Ecchymosis, Hemosiderin Staining, Mottled, Pallor, Rubor, Erythema. Periwound temperature was noted as No Abnormality. 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 He has had his fifth application of Puraply applied and we will then bolstered in place and apply a 4-layer Profore. He will be back to see as next week. Procedures Wound #5 Wound #5 is a Venous Leg Ulcer located on the Right,Medial Lower Leg. A skin graft procedure using a bioengineered skin substitute/cellular or tissue based product was performed by Evlyn Kanner, MD. Other was applied and secured with Steri-Strips. 8 sq cm of product was utilized and 46 sq cm was wasted due to wound size. Post Application, mepitel one was applied. A Time Out was conducted prior to the start of the procedure. The procedure was tolerated well with a pain level of 0 throughout and a pain level of 0 following the procedure. Post procedure Diagnosis Wound #5: Same as  Pre-Procedure . Plan Wound Cleansing: Wound #5 Right,Medial Lower Leg: Cleanse wound with mild soap and water No tub bath. Skin Barriers/Peri-Wound Care: Wound #5 Right,Medial Lower Leg: Moisturizing lotion Secondary Dressing: Wound #5 Right,Medial Lower Leg: ABD pad - Carboflex and ABD; gauze to bolster Mepitel One - over PuraPly Dressing Change Frequency: Wound #5 Right,Medial Lower Leg: Change dressing every week Follow-up Appointments: Wound #5 Right,Medial Lower Leg: Return Appointment in 1 week. Edema Control: Wound #5 Right,Medial Lower Leg: DONTE, LENZO ([id number]) 4-Layer Compression System - Right Lower Extremity General Notes: PuraPly applied by Dr Meyer Russel today After appropriately debriding the necrotic debris around the wound base with moist saline gauze his wound is clean. He has had his fifth application of Puraply applied and we will then bolstered in place and apply a 4-layer Profore. He will be back to see as next week. Electronic Signature(s) Signed: 08/08/2015 4:01:24 PM By: Evlyn Kanner MD, FACS Previous Signature: 08/08/2015 4:01:16 PM Version By: Evlyn Kanner MD, FACS Previous Signature: 08/08/2015 3:08:54 PM Version By: Evlyn Kanner MD, FACS Previous Signature: 08/08/2015 3:07:56 PM Version By: Evlyn Kanner MD, FACS Entered By: Evlyn Kanner on 08/08/2015 16:01:24 TEREZ, FREIMARK ([id number]) -------------------------------------------------------------------------------- SuperBill Details Patient Name: NICASIO, BARLOWE. Date of Service: 08/08/2015 Medical Record Number: [id number] Patient Account Number: [id number] Date of Birth/Sex: 01-May-1936 (80 y.o. 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 E66.01 Morbid (severe) obesity due to excess calories I89.0 Lymphedema, not elsewhere classified Facility Procedures CPT4 Code Description: [id number] 15275 - SKIN SUB GRAFT FACE/NK/HF/G ICD-10 Description Diagnosis I87.311 Chronic venous hypertension (idiopathic) with ulcer of L97.313 Non-pressure chronic ulcer of right ankle with  necrosi E66.01 Morbid (severe) obesity  due to excess calories I89.0 Lymphedema, not elsewhere classified Modifier: right lower s of muscle Quantity: 1 extremity Physician Procedures CPT4 Code Description: [id number] 15275 - WC PHYS SKIN SUB GRAFT FACE/NK/HF/G ICD-10 Description Diagnosis I87.311 Chronic venous hypertension (idiopathic) with ulcer of 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s E66.01 Morbid (severe)  obesity due to excess calories I89.0 Lymphedema, not elsewhere classified Modifier: right lower of muscle Quantity: 1 extremity Electronic Signature(s) Signed: 08/08/2015 3:09:10 PM By: Evlyn Kanner MD, FACS Entered By: Evlyn Kanner on 08/08/2015 15:09:10

## 2015-08-10 NOTE — Progress Notes (Signed)
ROMOND, PIPKINS ([id number]) Visit Report for 08/08/2015 Arrival Information Details Patient Name: [name], [name] Date of Service: 08/08/2015 2:45 PM Medical Record Number: [id number] Patient Account Number: [id number] Date of Birth/Sex: 1935-10-07 (80 y.o. Male) Treating RN: Clover Mealy, RN, BSN, [place] Sink Primary Care Physician: Terance Hart, DAVID Other Clinician: Referring Physician: Terance Hart, DAVID Treating Physician/Extender: Rudene Re in Treatment: 29 Visit Information History Since Last Visit Added or deleted any medications: No Patient Arrived: Wheel Chair Any new allergies or adverse reactions: No Arrival Time: 14:39 Had a fall or experienced change in No activities of daily living that may affect Accompanied By: self risk of falls: Transfer Assistance: None Signs or symptoms of abuse/neglect since last No Patient Identification Verified: Yes visito Secondary Verification Process Yes Hospitalized since last visit: No Completed: Has Dressing in Place as Prescribed: Yes Patient Requires Transmission-Based No Has Compression in Place as Prescribed: Yes Precautions: Pain Present Now: No Patient Has Alerts: No Electronic Signature(s) Signed: 08/08/2015 4:11:51 PM By: Elpidio Eric BSN, RN Entered By: Elpidio Eric on 08/08/2015 14:39:51 [name] ([id number]) -------------------------------------------------------------------------------- Encounter Discharge Information Details Patient Name: [name] Date of Service: 08/08/2015 2:45 PM Medical Record Number: [id number] Patient Account Number: [id number] Date of Birth/Sex: 01-15-1936 (80 y.o. Male) Treating RN: Curtis Sites Primary Care Physician: Dorothey Baseman Other Clinician: Referring Physician: Dorothey Baseman Treating Physician/Extender: Rudene Re in Treatment: 60 Encounter Discharge Information Items Discharge Pain Level: 0 Discharge Condition: Stable Ambulatory Status:  Wheelchair Discharge Destination: Nursing Home Transportation: Other Accompanied By: self Schedule Follow-up Appointment: Yes Medication Reconciliation completed Yes and provided to Patient/Care Kawon Willcutt: Provided on Clinical Summary of Care: 08/08/2015 Form Type Recipient Paper Patient [facility] Electronic Signature(s) Signed: 08/08/2015 3:25:12 PM By: Gwenlyn Perking Entered By: Gwenlyn Perking on 08/08/2015 15:25:12 [place], [place] Chrissie Noa ([id number]) -------------------------------------------------------------------------------- Lower Extremity Assessment Details Patient Name: [name] Date of Service: 08/08/2015 2:45 PM Medical Record Number: [id number] Patient Account Number: [id number] Date of Birth/Sex: 1935/10/28 (80 y.o. Male) Treating RN: Clover Mealy, RN, BSN, [place] Sink Primary Care Physician: Terance Hart, DAVID Other Clinician: Referring Physician: Terance Hart, DAVID Treating Physician/Extender: Rudene Re in Treatment: 29 Edema Assessment Assessed: [Left: No] [Right: No] Edema: [Left: Ye] [Right: s] Calf Left: Right: Point of Measurement: 40 cm From Medial Instep cm 46.2 cm Ankle Left: Right: Point of Measurement: 12 cm From Medial Instep cm 25.4 cm Vascular Assessment Pulses: Posterior Tibial Dorsalis Pedis Palpable: [Right:Yes] Extremity colors, hair growth, and conditions: Extremity Color: [Right:Hyperpigmented] Hair Growth on Extremity: [Right:No] Temperature of Extremity: [Right:Warm] Capillary Refill: [Right:< 3 seconds] Toe Nail Assessment Left: Right: Thick: Yes Discolored: Yes Deformed: Yes Improper Length and Hygiene: Yes Electronic Signature(s) Signed: 08/08/2015 4:11:51 PM By: Elpidio Eric BSN, RN Entered By: Elpidio Eric on 08/08/2015 14:42:24 [name] ([id number]) -------------------------------------------------------------------------------- Multi Wound Chart Details Patient Name: [name] Date of Service: 08/08/2015 2:45 PM Medical  Record Number: [id number] Patient Account Number: [id number] Date of Birth/Sex: December 23, 1935 (80 y.o. Male) Treating RN: Curtis Sites Primary Care Physician: Dorothey Baseman Other Clinician: Referring Physician: Terance Hart, DAVID Treating Physician/Extender: Rudene Re in Treatment: 29 Vital Signs Height(in): 68 Pulse(bpm): 68 Weight(lbs): Blood Pressure 148/62 (mmHg): Body Mass Index(BMI): Temperature(F): 97.6 Respiratory Rate 18 (breaths/min): Photos: [5:No Photos] [N/A:N/A] Wound Location: [5:Right Lower Leg - Medial] [N/A:N/A] Wounding Event: [5:Gradually Appeared] [N/A:N/A] Primary Etiology: [5:Venous Leg Ulcer] [N/A:N/A] Comorbid History: [5:Cataracts, Anemia, Hypertension, Osteoarthritis] [N/A:N/A] Date Acquired: [5:11/13/2014] [N/A:N/A] Weeks of Treatment: [5:29] [N/A:N/A] Wound Status: [5:Open] [N/A:N/A] Measurements L x W x D 4.6x1.7x0.2 [N/A:N/A] (  cm) Area (cm) : [5:6.142] [N/A:N/A] Volume (cm) : [5:1.228] [N/A:N/A] % Reduction in Area: [5:94.00%] [N/A:N/A] % Reduction in Volume: 96.00% [N/A:N/A] Classification: [5:Full Thickness Without Exposed Support Structures] [N/A:N/A] Exudate Amount: [5:Medium] [N/A:N/A] Exudate Type: [5:Serosanguineous] [N/A:N/A] Exudate Color: [5:red, brown] [N/A:N/A] Wound Margin: [5:Distinct, outline attached] [N/A:N/A] Granulation Amount: [5:Medium (34-66%)] [N/A:N/A] Granulation Quality: [5:Pink, Pale] [N/A:N/A] Necrotic Amount: [5:Small (1-33%)] [N/A:N/A] Exposed Structures: [5:Fascia: No Fat: No Tendon: No Muscle: No] [N/A:N/A] Joint: No Bone: No Limited to Skin Breakdown Epithelialization: Medium (34-66%) N/A N/A Periwound Skin Texture: Edema: No N/A N/A Excoriation: No Induration: No Callus: No Crepitus: No Fluctuance: No Friable: No Rash: No Scarring: No Periwound Skin Moist: Yes N/A N/A Moisture: Maceration: No Dry/Scaly: No Periwound Skin Color: Atrophie Blanche: No N/A N/A Cyanosis:  No Ecchymosis: No Erythema: No Hemosiderin Staining: No Mottled: No Pallor: No Rubor: No Temperature: No Abnormality N/A N/A Tenderness on No N/A N/A Palpation: Wound Preparation: Ulcer Cleansing: Other: N/A N/A soap and water Topical Anesthetic Applied: None, Other: lidocaine 4% Treatment Notes Electronic Signature(s) Signed: 08/08/2015 5:14:04 PM By: Curtis Sites Entered By: Curtis Sites on 08/08/2015 14:57:15 [name] ([id number]) -------------------------------------------------------------------------------- Multi-Disciplinary Care Plan Details Patient Name: [name], [name] Date of Service: 08/08/2015 2:45 PM Medical Record Number: [id number] Patient Account Number: [id number] Date of Birth/Sex: [birth year] (80 y.o. Male) Treating RN: Curtis Sites Primary Care Physician: Dorothey Baseman Other Clinician: Referring Physician: Dorothey Baseman Treating Physician/Extender: Rudene Re in Treatment: 11 Active Inactive Orientation to the Wound Care Program Nursing Diagnoses: Knowledge deficit related to the wound healing center program Goals: Patient/caregiver will verbalize understanding of the Wound Healing Center Program Date Initiated: 01/15/2015 Goal Status: Active Interventions: Provide education on orientation to the wound center Notes: Venous Leg Ulcer Nursing Diagnoses: Knowledge deficit related to disease process and management Potential for venous Insuffiency (use before diagnosis confirmed) Goals: Patient will maintain optimal edema control Date Initiated: 01/15/2015 Goal Status: Active Patient/caregiver will verbalize understanding of disease process and disease management Date Initiated: 01/15/2015 Goal Status: Active Verify adequate tissue perfusion prior to therapeutic compression application Date Initiated: 01/15/2015 Goal Status: Active Interventions: Assess peripheral edema status every visit. Compression as  ordered Provide education on venous insufficiency [name], [name] ([id number]) Treatment Activities: Test ordered outside of clinic : 08/08/2015 Therapeutic compression applied : 08/08/2015 Venous Duplex Doppler : 08/08/2015 Notes: Wound/Skin Impairment Nursing Diagnoses: Impaired tissue integrity Knowledge deficit related to ulceration/compromised skin integrity Goals: Patient/caregiver will verbalize understanding of skin care regimen Date Initiated: 01/15/2015 Goal Status: Active Ulcer/skin breakdown will have a volume reduction of 30% by week 4 Date Initiated: 01/15/2015 Goal Status: Active Ulcer/skin breakdown will have a volume reduction of 50% by week 8 Date Initiated: 01/15/2015 Goal Status: Active Ulcer/skin breakdown will have a volume reduction of 80% by week 12 Date Initiated: 01/15/2015 Goal Status: Active Ulcer/skin breakdown will heal within 14 weeks Date Initiated: 01/15/2015 Goal Status: Active Interventions: Assess patient/caregiver ability to perform ulcer/skin care regimen upon admission and as needed Assess ulceration(s) every visit Provide education on ulcer and skin care Notes: Electronic Signature(s) Signed: 08/08/2015 5:14:04 PM By: Curtis Sites Entered By: Curtis Sites on 08/08/2015 14:57:03 [name] ([id number]) -------------------------------------------------------------------------------- Pain Assessment Details Patient Name: [name] Date of Service: 08/08/2015 2:45 PM Medical Record Number: [id number] Patient Account Number: [id number] Date of Birth/Sex: [birth year] (80 y.o. Male) Treating RN: Clover Mealy, RN, BSN, [place] Sink Primary Care Physician: Dorothey Baseman Other Clinician: Referring Physician: Terance Hart DAVID Treating Physician/Extender: Rudene Re in Treatment: 29 Active  Problems Location of Pain Severity and Description of Pain Patient Has Paino No Site Locations Pain Management and Medication Current Pain  Management: Electronic Signature(s) Signed: 08/08/2015 4:11:51 PM By: Elpidio Eric BSN, RN Entered By: Elpidio Eric on 08/08/2015 14:41:16 [name] ([id number]) -------------------------------------------------------------------------------- Patient/Caregiver Education Details Patient Name: [name] Date of Service: 08/08/2015 2:45 PM Medical Record Number: [id number] Patient Account Number: [id number] Date of Birth/Gender: Apr 02, 1936 (80 y.o. Male) Treating RN: Curtis Sites Primary Care Physician: Dorothey Baseman Other Clinician: Referring Physician: Dorothey Baseman Treating Physician/Extender: Rudene Re in Treatment: 74 Education Assessment Education Provided To: Patient Education Topics Provided Wound/Skin Impairment: Handouts: Other: change dressing as directed Methods: Demonstration, Explain/Verbal Responses: State content correctly Electronic Signature(s) Signed: 08/08/2015 5:14:04 PM By: Curtis Sites Entered By: Curtis Sites on 08/08/2015 15:17:24 [name] ([id number]) -------------------------------------------------------------------------------- Wound Assessment Details Patient Name: [name] Date of Service: 08/08/2015 2:45 PM Medical Record Number: [id number] Patient Account Number: [id number] Date of Birth/Sex: 1935/11/02 (80 y.o. Male) Treating RN: Clover Mealy, RN, BSN, [place] Primary Care Physician: Terance Hart, DAVID Other Clinician: Referring Physician: Terance Hart, DAVID Treating Physician/Extender: Rudene Re in Treatment: 29 Wound Status Wound Number: 5 Primary Venous Leg Ulcer Etiology: Wound Location: Right Lower Leg - Medial Wound Status: Open Wounding Event: Gradually Appeared Comorbid Cataracts, Anemia, Hypertension, Date Acquired: 11/13/2014 History: Osteoarthritis Weeks Of Treatment: 29 Clustered Wound: No Photos Photo Uploaded By: Elpidio Eric on 08/08/2015 15:47:58 Wound Measurements Length: (cm)  4.6 Width: (cm) 1.7 Depth: (cm) 0.2 Area: (cm) 6.142 Volume: (cm) 1.228 % Reduction in Area: 94% % Reduction in Volume: 96% Epithelialization: Medium (34-66%) Tunneling: No Undermining: No Wound Description Full Thickness Without Exposed Classification: Support Structures Wound Margin: Distinct, outline attached Exudate Medium Amount: Exudate Type: Serosanguineous Exudate Color: red, brown Foul Odor After Cleansing: No Wound Bed Granulation Amount: Medium (34-66%) Exposed Structure Granulation Quality: Pink, Pale Fascia Exposed: No Necrotic Amount: Small (1-33%) Fat Layer Exposed: No [name], [name] ([id number]) Necrotic Quality: Adherent Slough Tendon Exposed: No Muscle Exposed: No Joint Exposed: No Bone Exposed: No Limited to Skin Breakdown Periwound Skin Texture Texture Color No Abnormalities Noted: No No Abnormalities Noted: No Callus: No Atrophie Blanche: No Crepitus: No Cyanosis: No Excoriation: No Ecchymosis: No Fluctuance: No Erythema: No Friable: No Hemosiderin Staining: No Induration: No Mottled: No Localized Edema: No Pallor: No Rash: No Rubor: No Scarring: No Temperature / Pain Moisture Temperature: No Abnormality No Abnormalities Noted: No Dry / Scaly: No Maceration: No Moist: Yes Wound Preparation Ulcer Cleansing: Other: soap and water, Topical Anesthetic Applied: None, Other: lidocaine 4%, Treatment Notes Wound #5 (Right, Medial Lower Leg) 1. Cleansed with: Clean wound with Normal Saline 2. Anesthetic Topical Lidocaine 4% cream to wound bed prior to debridement 3. Peri-wound Care: Skin Prep 4. Dressing Applied: Mepitel 5. Secondary Dressing Applied ABD Pad 7. Secured with Tape 4-Layer Compression System - Right Lower Extremity Notes puraply applied by Dr Meyer Russel today also carboflex added to dressing [name], [name] ([id number]) Electronic Signature(s) Signed: 08/08/2015 4:11:51 PM By: Elpidio Eric BSN,  RN Entered By: Elpidio Eric on 08/08/2015 14:51:16 [name], [name] ([id number]) -------------------------------------------------------------------------------- Vitals Details Patient Name: [name] Date of Service: 08/08/2015 2:45 PM Medical Record Number: [id number] Patient Account Number: [id number] Date of Birth/Sex: 1935/10/27 (80 y.o. Male) Treating RN: Clover Mealy, RN, BSN, [place] Sink Primary Care Physician: Terance Hart, DAVID Other Clinician: Referring Physician: Terance Hart, DAVID Treating Physician/Extender: Rudene Re in Treatment: 29 Vital Signs Time Taken: 14:41 Temperature (F): 97.6 Height (in): 68 Pulse (  bpm): 68 Respiratory Rate (breaths/min): 18 Blood Pressure (mmHg): 148/62 Reference Range: 80 - 120 mg / dl Electronic Signature(s) Signed: 08/08/2015 4:11:51 PM By: Elpidio Eric BSN, RN Entered By: Elpidio Eric on 08/08/2015 14:41:51

## 2015-08-15 ENCOUNTER — Encounter: Payer: Medicare Other | Admitting: Surgery

## 2015-08-15 DIAGNOSIS — I87311 Chronic venous hypertension (idiopathic) with ulcer of right lower extremity: Secondary | ICD-10-CM | POA: Diagnosis not present

## 2015-08-16 NOTE — Progress Notes (Addendum)
[name], [name] ([id number]) Visit Report for 08/15/2015 Chief Complaint Document Details Patient Name: [name], LIZER. Date of Service: 08/15/2015 3:45 PM Medical Record Number: [id number] Patient Account Number: [id number] Date of Birth/Sex: 1936-05-10 (80 y.o. Male) Treating RN: Clover Mealy, RN, BSN, [place] Sink Primary Care Physician: Terance Hart, DAVID Other Clinician: Referring Physician: Dorothey Baseman Treating Physician/Extender: Rudene Re in Treatment: 30 Information Obtained from: Patient Chief Complaint Patient presents for treatment of an open ulcer due to venous insufficiency. The patient has had a open wound on his right medial ankle for at least 3 years and was last seen in the wound clinic in February 2015. He was lost to follow-up after that. Electronic Signature(s) Signed: 08/15/2015 4:25:29 PM By: Evlyn Kanner MD, FACS Entered By: Evlyn Kanner on 08/15/2015 16:25:29 MAYJOR, AGER ([id number]) -------------------------------------------------------------------------------- Cellular or Tissue Based Product Details Patient Name: [name], YANG. Date of Service: 08/15/2015 3:45 PM Medical Record Number: [id number] Patient Account Number: [id number] Date of Birth/Sex: [birth year] (80 y.o. Male) Treating RN: Phillis Haggis Primary Care Physician: Terance Hart, DAVID Other Clinician: Referring Physician: Dorothey Baseman Treating Physician/Extender: Rudene Re in Treatment: 30 Cellular or Tissue Based Wound #5 Right,Medial Lower Leg Product Type Applied to: Performed By: Physician Evlyn Kanner, MD Cellular or Tissue Based Other Product Type: Time-Out Taken: Yes Location: genitalia / hands / feet / multiple digits Wound Size (sq cm): 5.88 Product Size (sq cm): 54 Waste Size (sq cm): 48 Waste Reason: wuond size Amount of Product Applied (sq cm): 6 Lot #: [id number] Expiration Date: 05/25/2017 Fenestrated: No Reconstituted: Yes Solution Type:  saline Solution Amount: 2ml Lot #: [id number] Solution Expiration 04/18/2017 Date: Secured: Yes Secured With: Steri-Strips Dressing Applied: Yes Primary Dressing: mepitel one Procedural Pain: 0 Post Procedural Pain: 0 Response to Treatment: Procedure was tolerated well Post Procedure Diagnosis Same as Pre-procedure Electronic Signature(s) Signed: 08/15/2015 4:41:30 PM By: Evlyn Kanner MD, FACS Entered By: Evlyn Kanner on 08/15/2015 16:41:30 NECO, KLING ([id number]) -------------------------------------------------------------------------------- HPI Details Patient Name: [name] Date of Service: 08/15/2015 3:45 PM Medical Record Number: [id number] Patient Account Number: [id number] Date of Birth/Sex: 1935-09-09 (80 y.o. Male) Treating RN: Clover Mealy, RN, BSN, [place] Sink Primary Care Physician: Terance Hart, DAVID Other Clinician: Referring Physician: Terance Hart, DAVID Treating Physician/Extender: Rudene Re in Treatment: 30 History of Present Illness Location: right lower extremity ulceration Quality: Patient reports experiencing a dull pain to affected area(s). Severity: Patient states wound are getting worse. 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 Modifying Factors: Consults to this date include:has received treatment in the wound center in the past but stopped coming here since February 2015 Associated Signs and Symptoms: Patient reports having difficulty standing for long periods. HPI Description: 80 year old male who is known to have progressive weakness and has been in a nursing home for a while has had chronic lower extremity edema both legs and a large open wound on the  right lower extremity which is has at least for about 3-4 years. The patient was seen in the wound clinic before and has been treated until February 2015 when he was lost to follow-up. Past medical history is significant for hypertension, gout, venous stasis ulcers, Parkinson's [name], constipation. He's  not been a diabetic but his last hemoglobin A1c was 6 in March 2015. There is documentation that he's had endovenous ablation of bilateral varicose veins but we have no documentation about this and this may be done at least 4-5 years ago. 01/22/2015 -- he has his vascular test is scheduled for this afternoon. 01/29/2015 -- the patient's vascular test was canceled last week because he was unable to get onto the examining bed at [facility]. His Unna's boot was not applied either and the patient had a dressing placed by home health. Today when his dressing was removed we found a lot of maggots in his right lower extremity. 02/07/2015 -- Was seen in the vascular office by the PA and she noted that a bilateral venous duplex study did not show any DVT, SVT or reflux bilaterally. The patient was advised to continue with Unna's boot and would at some stage to require graduated compression stockings of the 20-30 mmHg variety. In the future lymphedema pumps would also be considered. 02/14/2015 -- his dressing is smelling quite a bit and there is a discoloration of the wound suggestive of an infection. Deep tissue cultures will be taken today. 02/21/2015 -- the culture is back and it has growth moderate growth of Proteus and gram-negative rods sensitive to sulfa, ampicillin, cephazolin, Zosyn. He is already on doxycycline and his wound is looking clean and hence we will continue with this. 02/28/2015 -- he's been doing fine still on his antibiotics and has no fresh issues. 03/07/2015 -- I was told that because he lives in a nursing home the Apligraf was denied by his insurance company. 03/14/2015 -- we  are awaiting samples and a trial of Puraply to be tried for his ulceration. 03/21/2015 -- his Apligraf was approved and he is here for his first application of Apligraf 03/28/2015 -- his wound has had quite a bit of secretion and needs to be changed and hence I will use the YVONNE, PETITE. ([id number]) second application of Apligraf. 04/04/2015 -- he is here for a wound check but has a lot of secretions and hence his dressing needs to be taken down. 04/09/2015 -- he is here for his third application of Apligraf 04/18/2015 -- he is here for a wound check and though he does not have any overt infection he always has a foul odor to his leg. 04/25/2015 -- his wound has been doing much better and he has minimal drainage and the size is smaller. Of note he is on doxycycline. 05/02/2015 -- he is here for his fourth application of Apligraf 05/09/2015 -- he is here for a wound check and is bothered by a lot of odor from the wound. 05/16/2015 -- he has done very well, the odor and drainage is less with the wound dressing having been changed twice this week. He is here for his last application of Apligraf. 06/20/2015 -- an area superior to the previous wound has now opened up and this is a fairly superficial ulceration. 07/12/2015 -- he is here for his first application of Puraply. This is a sample and this is no charge. 07/18/2015 -- he is here for his second application of Puraply. This is a sample and this is no charge. 07/25/2015 -- he is here for his third application of Puraply. This is a sample and this is no charge. 08/01/2015 -- he is here for his fourth application of Puraply. This is a sample and this is no charge. 08/08/2015 -- he is here for his fifth  application of Puraply and this is a sample at no charge. 08/15/2015 -- he is here for his sixth application of Puraply and this is a sample at no charge. Electronic Signature(s) Signed: 08/15/2015 4:26:08 PM By: Evlyn Kanner MD,  FACS Entered By: Evlyn Kanner on 08/15/2015 16:26:08 ROMANO, STIGGER ([id number]) -------------------------------------------------------------------------------- Physical Exam Details Patient Name: [name], [name] Date of Service: 08/15/2015 3:45 PM Medical Record Number: [id number] Patient Account Number: [id number] Date of Birth/Sex: [birth year] (80 y.o. Male) Treating RN: Clover Mealy, RN, BSN, [place] Sink Primary Care Physician: Terance Hart, DAVID Other Clinician: Referring Physician: Terance Hart, DAVID Treating Physician/Extender: Rudene Re in Treatment: 30 Constitutional . Pulse regular. Respirations normal and unlabored. Afebrile. . Eyes Nonicteric. Reactive to light. Ears, Nose, Mouth, and Throat Lips, teeth, and gums WNL.Marland Kitchen Moist mucosa without lesions. Neck supple and nontender. No palpable supraclavicular or cervical adenopathy. Normal sized without goiter. Respiratory WNL. No retractions.. Cardiovascular Pedal Pulses WNL. No clubbing, cyanosis or edema. Lymphatic No adneopathy. No adenopathy. No adenopathy. Musculoskeletal Adexa without tenderness or enlargement.. Digits and nails w/o clubbing, cyanosis, infection, petechiae, ischemia, or inflammatory conditions.. Integumentary (Hair, Skin) No suspicious lesions. No crepitus or fluctuance. No peri-wound warmth or erythema. No masses.Marland Kitchen Psychiatric Judgement and insight Intact.. No evidence of depression, anxiety, or agitation.. Notes The wound is clean with minimal debris which was partially from the skin substitute. This was sharply removed with moist saline gauze and he is ready for his next application of Puraply. Electronic Signature(s) Signed: 08/15/2015 4:26:32 PM By: Evlyn Kanner MD, FACS Entered By: Evlyn Kanner on 08/15/2015 16:26:32 ADEBAYO, ENSMINGER ([id number]) -------------------------------------------------------------------------------- Physician Orders Details Patient Name: [name], [name] Date of  Service: 08/15/2015 3:45 PM Medical Record Number: [id number] Patient Account Number: [id number] Date of Birth/Sex: April 05, 1936 (80 y.o. 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 Skin Barriers/Peri-Wound Care Wound #5 Right,Medial Lower Leg o Moisturizing lotion Primary Wound Dressing Wound #5 Right,Medial Lower Leg o Other: - PuraPly applied by Dr Meyer Russel today in clinic Secondary Dressing Wound #5 Right,Medial Lower Leg o ABD pad - Carboflex and ABD; gauze to bolster o Mepitel One - over PuraPly Dressing Change Frequency Wound #5 Right,Medial Lower Leg o Change dressing every week Follow-up Appointments Wound #5 Right,Medial Lower Leg o Return Appointment in 1 week. Edema Control AMRITPAL, SHROPSHIRE ([id number]) Wound #5 Right,Medial Lower Leg o 4-Layer Compression System - Right Lower Extremity Electronic Signature(s) Signed: 08/15/2015 4:43:36 PM By: Evlyn Kanner MD, FACS Signed: 08/15/2015 5:51:59 PM By: Curtis Sites Entered By: Curtis Sites on 08/15/2015 16:36:27 HY, SWIATEK ([id number]) -------------------------------------------------------------------------------- Problem List Details Patient Name: [name], [name] Date of Service: 08/15/2015 3:45 PM Medical Record Number: [id number] Patient Account Number:  [id number] Date of Birth/Sex: 01/22/1936 (80 y.o. 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 Date of Service: 08/15/2015 3:45 PM Medical Record Number: [id number] Patient Account Number: [id number] Date of Birth/Sex: Jul 29, 1935 (80 y.o. Male) Treating RN: Phillis Haggis Primary Care Physician: Dorothey Baseman Other Clinician: Referring Physician: Dorothey Baseman Treating Physician/Extender: Rudene Re in Treatment: 30 Subjective Chief Complaint Information obtained from Patient Patient presents for treatment of an open ulcer due to venous insufficiency. The patient has had a open wound on his right medial ankle for at least 3 years and was last seen in the wound clinic in February 2015. He was lost to follow-up after that. History of Present Illness (HPI) The following HPI elements were documented for the patient's wound: Location: right lower extremity ulceration Quality: Patient reports experiencing a dull pain to affected area(s). Severity: Patient states wound are getting worse. 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 Modifying Factors: Consults to this date include:has received treatment in the wound center in the past but stopped coming here since February 2015 Associated Signs and Symptoms: Patient reports having difficulty standing for long periods. 80 year old male who is known to have progressive weakness and has been in a nursing home for a while has had chronic lower extremity edema both legs and a large open wound on the right lower extremity which is has at least for about 3-4 years. The patient was seen in the wound clinic before and has been treated until February 2015 when he was lost to follow-up. Past medical history is significant for hypertension, gout, venous stasis ulcers, Parkinson's [name], constipation. He's not been a diabetic but his last hemoglobin A1c was 6 in March 2015. There is documentation that he's had endovenous ablation of bilateral varicose veins but we have no documentation about this and this may be done at least 4-5 years ago. 01/22/2015 -- he has his vascular test is scheduled for this afternoon. 01/29/2015 -- the patient's vascular test was canceled last week because he was unable to get onto the examining bed at [facility]. His Unna's boot was not applied either and the patient had a dressing placed by home health. Today when his dressing was removed we found a lot of maggots in his right lower extremity. 02/07/2015 -- Was seen in the vascular office by the PA and she noted that a bilateral venous duplex study did not show any DVT, SVT or reflux bilaterally. The patient was advised to continue with Unna's boot and would at some stage to require graduated  compression stockings of the 20-30 mmHg variety. In the future lymphedema pumps would also be considered. 02/14/2015 -- his dressing is smelling quite a bit and there is a discoloration of the wound suggestive of an KATELYN, KOHLMEYER. ([id number]) infection. Deep tissue cultures will be taken today. 02/21/2015 -- the culture is back and  it has growth moderate growth of Proteus and gram-negative rods sensitive to sulfa, ampicillin, cephazolin, Zosyn. He is already on doxycycline and his wound is looking clean and hence we will continue with this. 02/28/2015 -- he's been doing fine still on his antibiotics and has no fresh issues. 03/07/2015 -- I was told that because he lives in a nursing home the Apligraf was denied by his insurance company. 03/14/2015 -- we are awaiting samples and a trial of Puraply to be tried for his ulceration. 03/21/2015 -- his Apligraf was approved and he is here for his first application of Apligraf 03/28/2015 -- his wound has had quite a bit of secretion and needs to be changed and hence I will use the second application of Apligraf. 04/04/2015 -- he is here for a wound check but has a lot of secretions and hence his dressing needs to be taken down. 04/09/2015 -- he is here for his third application of Apligraf 04/18/2015 -- he is here for a wound check and though he does not have any overt infection he always has a foul odor to his leg. 04/25/2015 -- his wound has been doing much better and he has minimal drainage and the size is smaller. Of note he is on doxycycline. 05/02/2015 -- he is here for his fourth application of Apligraf 05/09/2015 -- he is here for a wound check and is bothered by a lot of odor from the wound. 05/16/2015 -- he has done very well, the odor and drainage is less with the wound dressing having been changed twice this week. He is here for his last application of Apligraf. 06/20/2015 -- an area superior to the previous wound has now opened up  and this is a fairly superficial ulceration. 07/12/2015 -- he is here for his first application of Puraply. This is a sample and this is no charge. 07/18/2015 -- he is here for his second application of Puraply. This is a sample and this is no charge. 07/25/2015 -- he is here for his third application of Puraply. This is a sample and this is no charge. 08/01/2015 -- he is here for his fourth application of Puraply. This is a sample and this is no charge. 08/08/2015 -- he is here for his fifth application of Puraply and this is a sample at no charge. 08/15/2015 -- he is here for his sixth application of Puraply and this is a sample at no charge. Objective Constitutional Pulse regular. Respirations normal and unlabored. Afebrile. Vitals Time Taken: 4:11 PM, Height: 68 in, Temperature: 97.9 F, Pulse: 64 bpm, Respiratory Rate: 18 breaths/min, Blood Pressure: 156/68 mmHg. Eyes Nonicteric. Reactive to light. SHAMAN, MALEY ([id number]) Ears, Nose, Mouth, and Throat Lips, teeth, and gums WNL.Marland Kitchen Moist mucosa without lesions. Neck supple and nontender. No palpable supraclavicular or cervical adenopathy. Normal sized without goiter. Respiratory WNL. No retractions.. Cardiovascular Pedal Pulses WNL. No clubbing, cyanosis or edema. Lymphatic No adneopathy. No adenopathy. No adenopathy. Musculoskeletal Adexa without tenderness or enlargement.. Digits and nails w/o clubbing, cyanosis, infection, petechiae, ischemia, or inflammatory conditions.Marland Kitchen Psychiatric Judgement and insight Intact.. No evidence of depression, anxiety, or agitation.. General Notes: The wound is clean with minimal debris which was partially from the skin substitute. This was sharply removed with moist saline gauze and he is ready for his next application of Puraply. Integumentary (Hair, Skin) No suspicious lesions. No crepitus or fluctuance. No peri-wound warmth or erythema. No masses.. Wound #5 status is Open. Original  cause of wound was Gradually Appeared.  The wound is located on the Right,Medial Lower Leg. The wound measures 4.2cm length x 1.4cm width x 0.2cm depth; 4.618cm^2 area and 0.924cm^3 volume. The wound is limited to skin breakdown. There is no tunneling or undermining noted. There is a medium amount of serosanguineous drainage noted. The wound margin is distinct with the outline attached to the wound base. There is large (67-100%) pink, pale granulation within the wound bed. There is a small (1-33%) amount of necrotic tissue within the wound bed including Adherent Slough. The periwound skin appearance exhibited: Moist. The periwound skin appearance did not exhibit: Callus, Crepitus, Excoriation, Fluctuance, Friable, Induration, Localized Edema, Rash, Scarring, Dry/Scaly, Maceration, Atrophie Blanche, Cyanosis, Ecchymosis, Hemosiderin Staining, Mottled, Pallor, Rubor, Erythema. Periwound temperature was noted as No Abnormality. 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 A skin graft procedure using a bioengineered skin substitute/cellular or tissue based product was performed by Evlyn Kanner, MD. Other was applied and secured with Steri-Strips. 6 sq cm of product was utilized and 48 sq cm was wasted due to wuond size. Post Application, mepitel one was applied. A Time Out was conducted prior to the start of the procedure. The procedure was tolerated well with a pain level of 0 throughout and a pain level of 0 following the procedure. Post procedure Diagnosis Wound #5: Same as Pre-Procedure . Plan Wound Cleansing: Wound #5  Right,Medial Lower Leg: Cleanse wound with mild soap and water No tub bath. Skin Barriers/Peri-Wound Care: Wound #5 Right,Medial Lower Leg: Moisturizing lotion Primary Wound Dressing: Wound #5 Right,Medial Lower Leg: Other: - PuraPly applied by Dr Meyer Russel today in clinic Secondary Dressing: Wound #5 Right,Medial Lower Leg: ABD pad - Carboflex and ABD; gauze to bolster Mepitel One - over PuraPly Dressing Change Frequency: Wound #5 Right,Medial Lower Leg: Change dressing every week Follow-up Appointments: Wound #5 Right,Medial Lower Leg: Return Appointment in 1 week. Edema Control: Wound #5 Right,Medial Lower Leg: 4-Layer Compression System - Right Lower Extremity BRANDELL, MAREADY ([id number]) After appropriately debriding the necrotic debris around the wound base with moist saline gauze his wound is clean. He has had his sixth application of Puraply applied and we will then bolstered in place and apply a 4-layer Profore. He will be back to see [place] next week. Electronic Signature(s) Signed: 08/15/2015 4:42:45 PM By: Evlyn Kanner MD, FACS Entered By: Evlyn Kanner on 08/15/2015 16:42:44 LENVILLE, HIBBERD ([id number]) -------------------------------------------------------------------------------- SuperBill Details Patient Name: ALEXANDRE, [name] Date of Service: 08/15/2015 Medical Record Number: [id number] Patient Account Number: [id number] Date of Birth/Sex: [birth year] (80 y.o. 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 E66.01 Morbid (severe) obesity due to excess calories I89.0 Lymphedema, not elsewhere  classified Facility Procedures CPT4 Code: 62952841 Description: 99213 - WOUND CARE VISIT-LEV 3 EST PT Modifier: Quantity: 1 Physician Procedures CPT4 Code Description: 3244010 [id number] - WC PHYS SKIN SUB GRAFT FACE/NK/HF/G ICD-10 Description Diagnosis I87.311 Chronic venous hypertension (idiopathic) with ulcer of L97.313 Non-pressure  chronic ulcer of right ankle with necrosi E66.01 Morbid (severe)  obesity due to excess calories I89.0 Lymphedema, not elsewhere classified Modifier: right lower s of muscle Quantity: 1 extremity Electronic Signature(s) Signed: 08/16/2015 10:30:33 AM By: Francie Massing Signed: 08/16/2015 3:57:16 PM By: Evlyn Kanner MD, FACS Previous Signature: 08/15/2015 5:11:55 PM Version By: Curtis Sites Previous Signature: 08/15/2015 4:42:58 PM Version By: Evlyn Kanner MD, FACS Entered By: Francie Massing on 08/16/2015 10:30:33

## 2015-08-16 NOTE — Progress Notes (Signed)
[name], [name] ([id number]) Visit Report for 08/15/2015 Arrival Information Details Patient Name: [name], [name] Date of Service: 08/15/2015 3:45 PM Medical Record Number: [id number] Patient Account Number: [id number] Date of Birth/Sex: 28-Nov-1935 (80 y.o. Male) Treating RN: Phillis Haggis Primary Care Physician: Terance Hart, DAVID Other Clinician: Referring Physician: Terance Hart, DAVID Treating Physician/Extender: Rudene Re in Treatment: 30 Visit Information History Since Last Visit All ordered tests and consults were completed: No Patient Arrived: Wheel Chair Added or deleted any medications: No Arrival Time: 16:08 Any new allergies or adverse reactions: No Accompanied By: self Had a fall or experienced change in No activities of daily living that may affect Transfer Assistance: None risk of falls: Patient Requires Transmission-Based No Signs or symptoms of abuse/neglect since last No Precautions: visito Patient Has Alerts: No Hospitalized since last visit: No Pain Present Now: No Electronic Signature(s) Signed: 08/15/2015 5:51:12 PM By: Alejandro Mulling Entered By: Alejandro Mulling on 08/15/2015 16:11:34 [name] ([id number]) -------------------------------------------------------------------------------- Clinic Level of Care Assessment Details Patient Name: [name] Date of Service: 08/15/2015 3:45 PM Medical Record Number: [id number] Patient Account Number: [id number] Date of Birth/Sex: 05/28/1936 (80 y.o. Male) Treating RN: Curtis Sites Primary Care Physician: Terance Hart, DAVID Other Clinician: Referring Physician: Terance Hart, DAVID Treating Physician/Extender: Rudene Re in Treatment: 30 Clinic Level of Care Assessment Items TOOL 4 Quantity Score []  - Use when only an EandM is performed on FOLLOW-UP visit 0 ASSESSMENTS - Nursing Assessment / Reassessment X - Reassessment of Co-morbidities (includes updates in patient status) 1  10 X - Reassessment of Adherence to Treatment Plan 1 5 ASSESSMENTS - Wound and Skin Assessment / Reassessment X - Simple Wound Assessment / Reassessment - one wound 1 5 []  - Complex Wound Assessment / Reassessment - multiple wounds 0 []  - Dermatologic / Skin Assessment (not related to wound area) 0 ASSESSMENTS - Focused Assessment X - Circumferential Edema Measurements - multi extremities 1 5 []  - Nutritional Assessment / Counseling / Intervention 0 X - Lower Extremity Assessment (monofilament, tuning fork, pulses) 1 5 []  - Peripheral Arterial Disease Assessment (using hand held doppler) 0 ASSESSMENTS - Ostomy and/or Continence Assessment and Care []  - Incontinence Assessment and Management 0 []  - Ostomy Care Assessment and Management (repouching, etc.) 0 PROCESS - Coordination of Care X - Simple Patient / Family Education for ongoing care 1 15 []  - Complex (extensive) Patient / Family Education for ongoing care 0 []  - Staff obtains Chiropractor, Records, Test Results / Process Orders 0 []  - Staff telephones HHA, Nursing Homes / Clarify orders / etc 0 []  - Routine Transfer to another Facility (non-emergent condition) 0 [name], [name] ([id number]) []  - Routine Hospital Admission (non-emergent condition) 0 []  - New Admissions / Manufacturing engineer / Ordering NPWT, Apligraf, etc. 0 []  - Emergency Hospital Admission (emergent condition) 0 X - Simple Discharge Coordination 1 10 []  - Complex (extensive) Discharge Coordination 0 PROCESS - Special Needs []  - Pediatric / Minor Patient Management 0 []  - Isolation Patient Management 0 []  - Hearing / Language / Visual special needs 0 []  - Assessment of Community assistance (transportation, D/C planning, etc.) 0 []  - Additional assistance / Altered mentation 0 []  - Support Surface(s) Assessment (bed, cushion, seat, etc.) 0 INTERVENTIONS - Wound Cleansing / Measurement X - Simple Wound Cleansing - one wound 1 5 []  - Complex Wound Cleansing -  multiple wounds 0 X - Wound Imaging (photographs - any number of wounds) 1 5 []  - Wound Tracing (instead of photographs) 0  X - Simple Wound Measurement - one wound 1 5  - Complex Wound Measurement - multiple wounds 0 INTERVENTIONS - Wound Dressings  - Small Wound Dressing one or multiple wounds 0  - Medium Wound Dressing one or multiple wounds 0 X - Large Wound Dressing one or multiple wounds 1 20  - Application of Medications - topical 0  - Application of Medications - injection 0 INTERVENTIONS - Miscellaneous  - External ear exam 0 [name], [name] ([id number])  - Specimen Collection (cultures, biopsies, blood, body fluids, etc.) 0  - Specimen(s) / Culture(s) sent or taken to Lab for analysis 0  - Patient Transfer (multiple staff / Michiel Sites Lift / Similar devices) 0  - Simple Staple / Suture removal (25 or less) 0  - Complex Staple / Suture removal (26 or more) 0  - Hypo / Hyperglycemic Management (close monitor of Blood Glucose) 0  - Ankle / Brachial Index (ABI) - do not check if billed separately 0 X - Vital Signs 1 5 Has the patient been seen at the hospital within the last three years: Yes Total Score: 95 Level Of Care: New/Established - Level 3 Electronic Signature(s) Signed: 08/15/2015 5:11:44 PM By: Curtis Sites Entered By: Curtis Sites on 08/15/2015 17:11:44 [name] ([id number]) -------------------------------------------------------------------------------- Encounter Discharge Information Details Patient Name: [name] Date of Service: 08/15/2015 3:45 PM Medical Record Number: [id number] Patient Account Number: [id number] Date of Birth/Sex: 1936-01-13 (80 y.o. Male) Treating RN: Curtis Sites Primary Care Physician: Dorothey Baseman Other Clinician: Referring Physician: Dorothey Baseman Treating Physician/Extender: Rudene Re in Treatment: 30 Encounter Discharge Information Items Discharge Pain Level:  0 Discharge Condition: Stable Ambulatory Status: Wheelchair Discharge Destination: Nursing Home Transportation: Other Accompanied By: self Schedule Follow-up Appointment: Yes Medication Reconciliation completed No and provided to Patient/Care Oletta Buehring: Provided on Clinical Summary of Care: 08/15/2015 Form Type Recipient Paper Patient [facility] Electronic Signature(s) Signed: 08/15/2015 4:44:51 PM By: Gwenlyn Perking Entered By: Gwenlyn Perking on 08/15/2015 16:44:51 Keysean, Savino Chrissie Noa ([id number]) -------------------------------------------------------------------------------- Lower Extremity Assessment Details Patient Name: [name] Date of Service: 08/15/2015 3:45 PM Medical Record Number: [id number] Patient Account Number: [id number] Date of Birth/Sex: 1936/05/21 (80 y.o. Male) Treating RN: Phillis Haggis Primary Care Physician: Terance Hart, DAVID Other Clinician: Referring Physician: Terance Hart, DAVID Treating Physician/Extender: Rudene Re in Treatment: 30 Edema Assessment Assessed: [Left: No] [Right: No] Edema: [Left: Ye] [Right: s] Calf Left: Right: Point of Measurement: 40 cm From Medial Instep cm 37 cm Ankle Left: Right: Point of Measurement: 12 cm From Medial Instep cm 27.5 cm Vascular Assessment Pulses: Posterior Tibial Dorsalis Pedis Palpable: [Right:Yes] Extremity colors, hair growth, and conditions: Extremity Color: [Right:Hyperpigmented] Hair Growth on Extremity: [Right:No] Temperature of Extremity: [Right:Warm] Capillary Refill: [Right:< 3 seconds] Toe Nail Assessment Left: Right: Thick: Yes Discolored: Yes Deformed: No Improper Length and Hygiene: No Electronic Signature(s) Signed: 08/15/2015 5:51:12 PM By: Alejandro Mulling Entered By: Alejandro Mulling on 08/15/2015 16:24:35 [name] ([id number]) -------------------------------------------------------------------------------- Multi Wound Chart Details Patient Name: [name] Date of Service: 08/15/2015 3:45 PM Medical Record Number: [id number] Patient Account Number: [id number] Date of Birth/Sex: [birth year] (80 y.o. Male) Treating RN: Phillis Haggis Primary Care Physician: Dorothey Baseman Other Clinician: Referring Physician: Terance Hart, DAVID Treating Physician/Extender: Rudene Re in Treatment: 30 Vital Signs Height(in): 68 Pulse(bpm): 64 Weight(lbs): Blood Pressure 156/68 (mmHg): Body Mass Index(BMI): Temperature(F): 97.9 Respiratory Rate 18 (breaths/min): Photos: [5:No Photos] [N/A:N/A] Wound Location: [5:Right Lower Leg - Medial] [N/A:N/A] Wounding Event: [5:Gradually Appeared] [N/A:N/A] Primary Etiology: [5:Venous  Leg Ulcer] [N/A:N/A] Comorbid History: [5:Cataracts, Anemia, Hypertension, Osteoarthritis] [N/A:N/A] Date Acquired: [5:11/13/2014] [N/A:N/A] Weeks of Treatment: [5:30] [N/A:N/A] Wound Status: [5:Open] [N/A:N/A] Measurements L x W x D 4.2x1.4x0.2 [N/A:N/A] (cm) Area (cm) : [5:4.618] [N/A:N/A] Volume (cm) : [5:0.924] [N/A:N/A] % Reduction in Area: [5:95.50%] [N/A:N/A] % Reduction in Volume: 97.00% [N/A:N/A] Classification: [5:Full Thickness Without Exposed Support Structures] [N/A:N/A] Exudate Amount: [5:Medium] [N/A:N/A] Exudate Type: [5:Serosanguineous] [N/A:N/A] Exudate Color: [5:red, brown] [N/A:N/A] Wound Margin: [5:Distinct, outline attached] [N/A:N/A] Granulation Amount: [5:Large (67-100%)] [N/A:N/A] Granulation Quality: [5:Pink, Pale] [N/A:N/A] Necrotic Amount: [5:Small (1-33%)] [N/A:N/A] Exposed Structures: [5:Fascia: No Fat: No Tendon: No Muscle: No] [N/A:N/A] Joint: No Bone: No Limited to Skin Breakdown Epithelialization: Medium (34-66%) N/A N/A Periwound Skin Texture: Edema: No N/A N/A Excoriation: No Induration: No Callus: No Crepitus: No Fluctuance: No Friable: No Rash: No Scarring: No Periwound Skin Moist: Yes N/A N/A Moisture: Maceration: No Dry/Scaly: No Periwound Skin Color:  Atrophie Blanche: No N/A N/A Cyanosis: No Ecchymosis: No Erythema: No Hemosiderin Staining: No Mottled: No Pallor: No Rubor: No Temperature: No Abnormality N/A N/A Tenderness on No N/A N/A Palpation: Wound Preparation: Ulcer Cleansing: Other: N/A N/A soap and water Topical Anesthetic Applied: None, Other: lidocaine 4% Treatment Notes Electronic Signature(s) Signed: 08/15/2015 5:51:12 PM By: Alejandro Mulling Entered By: Alejandro Mulling on 08/15/2015 16:25:18 [name] ([id number]) -------------------------------------------------------------------------------- Multi-Disciplinary Care Plan Details Patient Name: [name], [name] Date of Service: 08/15/2015 3:45 PM Medical Record Number: [id number] Patient Account Number: [id number] Date of Birth/Sex: 01/13/1936 (80 y.o. Male) Treating RN: Phillis Haggis Primary Care Physician: Terance Hart, DAVID Other Clinician: Referring Physician: Dorothey Baseman Treating Physician/Extender: Rudene Re in Treatment: 30 Active Inactive Orientation to the Wound Care Program Nursing Diagnoses: Knowledge deficit related to the wound healing center program Goals: Patient/caregiver will verbalize understanding of the Wound Healing Center Program Date Initiated: 01/15/2015 Goal Status: Active Interventions: Provide education on orientation to the wound center Notes: Venous Leg Ulcer Nursing Diagnoses: Knowledge deficit related to disease process and management Potential for venous Insuffiency (use before diagnosis confirmed) Goals: Patient will maintain optimal edema control Date Initiated: 01/15/2015 Goal Status: Active Patient/caregiver will verbalize understanding of disease process and disease management Date Initiated: 01/15/2015 Goal Status: Active Verify adequate tissue perfusion prior to therapeutic compression application Date Initiated: 01/15/2015 Goal Status: Active Interventions: Assess peripheral edema status  every visit. Compression as ordered Provide education on venous insufficiency [name], [name] ([id number]) Treatment Activities: Test ordered outside of clinic : 08/15/2015 Therapeutic compression applied : 08/15/2015 Venous Duplex Doppler : 08/15/2015 Notes: Wound/Skin Impairment Nursing Diagnoses: Impaired tissue integrity Knowledge deficit related to ulceration/compromised skin integrity Goals: Patient/caregiver will verbalize understanding of skin care regimen Date Initiated: 01/15/2015 Goal Status: Active Ulcer/skin breakdown will have a volume reduction of 30% by week 4 Date Initiated: 01/15/2015 Goal Status: Active Ulcer/skin breakdown will have a volume reduction of 50% by week 8 Date Initiated: 01/15/2015 Goal Status: Active Ulcer/skin breakdown will have a volume reduction of 80% by week 12 Date Initiated: 01/15/2015 Goal Status: Active Ulcer/skin breakdown will heal within 14 weeks Date Initiated: 01/15/2015 Goal Status: Active Interventions: Assess patient/caregiver ability to perform ulcer/skin care regimen upon admission and as needed Assess ulceration(s) every visit Provide education on ulcer and skin care Notes: Electronic Signature(s) Signed: 08/15/2015 5:51:12 PM By: Alejandro Mulling Entered By: Alejandro Mulling on 08/15/2015 16:25:09 [name] ([id number]) -------------------------------------------------------------------------------- Pain Assessment Details Patient Name: [name] Date of Service: 08/15/2015 3:45 PM Medical Record Number: [id number] Patient Account Number: [id number] Date of  Birth/Sex: 24-Aug-1935 (80 y.o. Male) Treating RN: Phillis Haggis Primary Care Physician: Terance Hart, DAVID Other Clinician: Referring Physician: Terance Hart, DAVID Treating Physician/Extender: Rudene Re in Treatment: 30 Active Problems Location of Pain Severity and Description of Pain Patient Has Paino No Site Locations Pain Management and  Medication Current Pain Management: Electronic Signature(s) Signed: 08/15/2015 5:51:12 PM By: Alejandro Mulling Entered By: Alejandro Mulling on 08/15/2015 16:11:41 [name] ([id number]) -------------------------------------------------------------------------------- Patient/Caregiver Education Details Patient Name: [name], [name] Date of Service: 08/15/2015 3:45 PM Medical Record Number: [id number] Patient Account Number: [id number] Date of Birth/Gender: September 02, 1935 (80 y.o. Male) Treating RN: Curtis Sites Primary Care Physician: Dorothey Baseman Other Clinician: Referring Physician: Dorothey Baseman Treating Physician/Extender: Rudene Re in Treatment: 30 Education Assessment Education Provided To: Patient Education Topics Provided Venous: Handouts: Other: keep wrap in place this week Methods: Demonstration, Explain/Verbal Responses: State content correctly Electronic Signature(s) Signed: 08/15/2015 5:51:59 PM By: Curtis Sites Entered By: Curtis Sites on 08/15/2015 16:41:08 [name] ([id number]) -------------------------------------------------------------------------------- Wound Assessment Details Patient Name: [name] Date of Service: 08/15/2015 3:45 PM Medical Record Number: [id number] Patient Account Number: [id number] Date of Birth/Sex: 04-02-1936 (80 y.o. Male) Treating RN: Phillis Haggis Primary Care Physician: Terance Hart, DAVID Other Clinician: Referring Physician: Terance Hart, DAVID Treating Physician/Extender: Rudene Re in Treatment: 30 Wound Status Wound Number: 5 Primary Venous Leg Ulcer Etiology: Wound Location: Right Lower Leg - Medial Wound Status: Open Wounding Event: Gradually Appeared Comorbid Cataracts, Anemia, Hypertension, Date Acquired: 11/13/2014 History: Osteoarthritis Weeks Of Treatment: 30 Clustered Wound: No Photos Photo Uploaded By: Curtis Sites on 08/15/2015 17:20:36 Wound  Measurements Length: (cm) 4.2 Width: (cm) 1.4 Depth: (cm) 0.2 Area: (cm) 4.618 Volume: (cm) 0.924 % Reduction in Area: 95.5% % Reduction in Volume: 97% Epithelialization: Medium (34-66%) Tunneling: No Undermining: No Wound Description Full Thickness Without Exposed Classification: Support Structures Wound Margin: Distinct, outline attached Exudate Medium Amount: Exudate Type: Serosanguineous Exudate Color: red, brown Foul Odor After Cleansing: No Wound Bed Granulation Amount: Large (67-100%) Exposed Structure Granulation Quality: Pink, Pale Fascia Exposed: No Necrotic Amount: Small (1-33%) Fat Layer Exposed: No [name], BURANDT ([id number]) Necrotic Quality: Adherent Slough Tendon Exposed: No Muscle Exposed: No Joint Exposed: No Bone Exposed: No Limited to Skin Breakdown Periwound Skin Texture Texture Color No Abnormalities Noted: No No Abnormalities Noted: No Callus: No Atrophie Blanche: No Crepitus: No Cyanosis: No Excoriation: No Ecchymosis: No Fluctuance: No Erythema: No Friable: No Hemosiderin Staining: No Induration: No Mottled: No Localized Edema: No Pallor: No Rash: No Rubor: No Scarring: No Temperature / Pain Moisture Temperature: No Abnormality No Abnormalities Noted: No Dry / Scaly: No Maceration: No Moist: Yes Wound Preparation Ulcer Cleansing: Other: soap and water, Topical Anesthetic Applied: None, Other: lidocaine 4%, Treatment Notes Wound #5 (Right, Medial Lower Leg) 1. Cleansed with: Clean wound with Normal Saline 2. Anesthetic Topical Lidocaine 4% cream to wound bed prior to debridement 4. Dressing Applied: Mepitel Other dressing (specify in notes) 5. Secondary Dressing Applied ABD Pad 7. Secured with Tape 4-Layer Compression System - Right Lower Extremity Notes puraply applied by Dr Meyer Russel today also carboflex added to dressing [name], [name] ([id number]) Electronic Signature(s) Signed: 08/15/2015 5:51:12 PM  By: Alejandro Mulling Entered By: Alejandro Mulling on 08/15/2015 16:25:00 [name], [name] ([id number]) -------------------------------------------------------------------------------- Vitals Details Patient Name: [name] Date of Service: 08/15/2015 3:45 PM Medical Record Number: [id number] Patient Account Number: [id number] Date of Birth/Sex: 12-03-1935 (80 y.o. Male) Treating RN: Phillis Haggis Primary Care Physician: Dorothey Baseman Other Clinician: Referring Physician:  BRONSTEIN, DAVID Treating Physician/Extender: Rudene Re in Treatment: 30 Vital Signs Time Taken: 16:11 Temperature (F): 97.9 Height (in): 68 Pulse (bpm): 64 Respiratory Rate (breaths/min): 18 Blood Pressure (mmHg): 156/68 Reference Range: 80 - 120 mg / dl Electronic Signature(s) Signed: 08/15/2015 5:51:12 PM By: Alejandro Mulling Entered By: Alejandro Mulling on 08/15/2015 16:13:40

## 2015-08-22 ENCOUNTER — Encounter (HOSPITAL_BASED_OUTPATIENT_CLINIC_OR_DEPARTMENT_OTHER): Payer: Medicare Other | Admitting: General Surgery

## 2015-08-22 DIAGNOSIS — L97919 Non-pressure chronic ulcer of unspecified part of right lower leg with unspecified severity: Principal | ICD-10-CM

## 2015-08-22 DIAGNOSIS — I83019 Varicose veins of right lower extremity with ulcer of unspecified site: Secondary | ICD-10-CM | POA: Diagnosis not present

## 2015-08-22 DIAGNOSIS — I87311 Chronic venous hypertension (idiopathic) with ulcer of right lower extremity: Secondary | ICD-10-CM | POA: Diagnosis not present

## 2015-08-22 NOTE — Progress Notes (Signed)
seeiheal 

## 2015-08-24 NOTE — Progress Notes (Signed)
[name], [name] ([id number]) Visit Report for 08/22/2015 Arrival Information Details Patient Name: [name] Date of Service: 08/22/2015 2:00 PM Medical Record Number: [id number] Patient Account Number: [id number] Date of Birth/Sex: 08-Jul-1935 (80 y.o. Male) Treating RN: Curtis Sites Primary Care Physician: Dorothey Baseman Other Clinician: Referring Physician: Terance Hart, DAVID Treating Physician/Extender: Elayne Snare in Treatment: 31 Visit Information History Since Last Visit Added or deleted any medications: No Patient Arrived: Wheel Chair Any new allergies or adverse reactions: No Arrival Time: 13:50 Had a fall or experienced change in No activities of daily living that may affect Accompanied By: self risk of falls: Transfer Assistance: None Signs or symptoms of abuse/neglect since last No Patient Identification Verified: Yes visito Secondary Verification Process Yes Hospitalized since last visit: No Completed: Pain Present Now: No Patient Requires Transmission-Based No Precautions: Patient Has Alerts: No Electronic Signature(s) Signed: 08/22/2015 3:47:52 PM By: Curtis Sites Entered By: Curtis Sites on 08/22/2015 13:52:58 [name] ([id number]) -------------------------------------------------------------------------------- Clinic Level of Care Assessment Details Patient Name: [name] Date of Service: 08/22/2015 2:00 PM Medical Record Number: [id number] Patient Account Number: [id number] Date of Birth/Sex: [birth year] (80 y.o. Male) Treating RN: Phillis Haggis Primary Care Physician: Terance Hart, DAVID Other Clinician: Referring Physician: Terance Hart, DAVID Treating Physician/Extender: Elayne Snare in Treatment: 31 Clinic Level of Care Assessment Items TOOL 3 Quantity Score X - Use when EandM and Procedure is performed on FOLLOW-UP visit 1 0 ASSESSMENTS - Nursing Assessment / Reassessment X - Reassessment of Co-morbidities  (includes updates in patient status) 1 10 X - Reassessment of Adherence to Treatment Plan 1 5 ASSESSMENTS - Wound and Skin Assessment / Reassessment []  - Points for Wound Assessment can only be taken for a new wound of unknown 0 or different etiology and a procedure is NOT performed to that wound X - Simple Wound Assessment / Reassessment - one wound 1 5 []  - Complex Wound Assessment / Reassessment - multiple wounds 0 []  - Dermatologic / Skin Assessment (not related to wound area) 0 ASSESSMENTS - Focused Assessment X - Circumferential Edema Measurements - multi extremities 1 5 []  - Nutritional Assessment / Counseling / Intervention 0 X - Lower Extremity Assessment (monofilament, tuning fork, pulses) 1 5 []  - Peripheral Arterial Disease Assessment (using hand held doppler) 0 ASSESSMENTS - Ostomy and/or Continence Assessment and Care []  - Incontinence Assessment and Management 0 []  - Ostomy Care Assessment and Management (repouching, etc.) 0 PROCESS - Coordination of Care []  - Points for Discharge Coordination can only be taken for a new wound of 0 unknown or different etiology and a procedure is NOT performed to that wound X - Simple Patient / Family Education for ongoing care 1 15 []  - Complex (extensive) Patient / Family Education for ongoing care 0 [name], [name] ([id number]) []  - Staff obtains Consents, Records, Test Results / Process Orders 0 []  - Staff telephones HHA, Nursing Homes / Clarify orders / etc 0 []  - Routine Transfer to another Facility (non-emergent condition) 0 []  - Routine Hospital Admission (non-emergent condition) 0 []  - New Admissions / Manufacturing engineer / Ordering NPWT, Apligraf, etc. 0 []  - Emergency Hospital Admission (emergent condition) 0 X - Simple Discharge Coordination 1 10 []  - Complex (extensive) Discharge Coordination 0 PROCESS - Special Needs []  - Pediatric / Minor Patient Management 0 []  - Isolation Patient Management 0 []  - Hearing /  Language / Visual special needs 0 []  - Assessment of Community assistance (transportation, D/C planning, etc.) 0 []  - Additional  assistance / Altered mentation 0 []  - Support Surface(s) Assessment (bed, cushion, seat, etc.) 0 INTERVENTIONS - Wound Cleansing / Measurement []  - Points for Wound Cleaning / Measurement, Wound Dressing, Specimen 0 Collection and Specimen taken to lab can only be taken for a new wound of unknown or different etiology and a procedure is NOT performed to that wound X - Simple Wound Cleansing - one wound 1 5 []  - Complex Wound Cleansing - multiple wounds 0 X - Wound Imaging (photographs - any number of wounds) 1 5 []  - Wound Tracing (instead of photographs) 0 X - Simple Wound Measurement - one wound 1 5 []  - Complex Wound Measurement - multiple wounds 0 INTERVENTIONS - Wound Dressings []  - Small Wound Dressing one or multiple wounds 0 [name], [name] ([id number]) []  - Medium Wound Dressing one or multiple wounds 0 X - Large Wound Dressing one or multiple wounds 1 20 INTERVENTIONS - Miscellaneous []  - External ear exam 0 []  - Specimen Collection (cultures, biopsies, blood, body fluids, etc.) 0 []  - Specimen(s) / Culture(s) sent or taken to Lab for analysis 0 []  - Patient Transfer (multiple staff / Nurse, adult / Similar devices) 0 []  - Simple Staple / Suture removal (25 or less) 0 []  - Complex Staple / Suture removal (26 or more) 0 []  - Hypo / Hyperglycemic Management (close monitor of Blood Glucose) 0 []  - Ankle / Brachial Index (ABI) - do not check if billed separately 0 X - Vital Signs 1 5 Has the patient been seen at the hospital within the last three years: Yes Total Score: 95 Level Of Care: New/Established - Level 3 Electronic Signature(s) Signed: 08/23/2015 4:59:51 PM By: Alejandro Mulling Entered By: Alejandro Mulling on 08/22/2015 15:43:41 [name], [name]  ([id number]) -------------------------------------------------------------------------------- Encounter Discharge Information Details Patient Name: [name] Date of Service: 08/22/2015 2:00 PM Medical Record Number: [id number] Patient Account Number: [id number] Date of Birth/Sex: 1936/06/06 (80 y.o. Male) Treating RN: Curtis Sites Primary Care Physician: Dorothey Baseman Other Clinician: Referring Physician: Terance Hart, DAVID Treating Physician/Extender: Elayne Snare in Treatment: 31 Encounter Discharge Information Items Discharge Pain Level: 0 Discharge Condition: Stable Ambulatory Status: Wheelchair Discharge Destination: Nursing Home Transportation: Other Accompanied By: self Schedule Follow-up Appointment: Yes Medication Reconciliation completed and provided to Patient/Care Yes [name]: Provided on Clinical Summary of Care: 08/22/2015 Form Type Recipient Paper Patient [facility] Electronic Signature(s) Signed: 08/23/2015 4:59:51 PM By: Alejandro Mulling Previous Signature: 08/22/2015 2:44:37 PM Version By: Gwenlyn Perking Entered By: Alejandro Mulling on 08/22/2015 15:39:48 [name] ([id number]) -------------------------------------------------------------------------------- Lower Extremity Assessment Details Patient Name: [name] Date of Service: 08/22/2015 2:00 PM Medical Record Number: [id number] Patient Account Number: [id number] Date of Birth/Sex: [birth year] (80 y.o. Male) Treating RN: Curtis Sites Primary Care Physician: Terance Hart, DAVID Other Clinician: Referring Physician: Terance Hart, DAVID Treating Physician/Extender: Elayne Snare in Treatment: 31 Edema Assessment Assessed: [Left: No] [Right: No] Edema: [Left: Ye] [Right: s] Calf Left: Right: Point of Measurement: 40 cm From Medial Instep cm 37 cm Ankle Left: Right: Point of Measurement: 12 cm From Medial Instep cm 27.5 cm Vascular Assessment Pulses: Posterior  Tibial Dorsalis Pedis Palpable: [Right:Yes] Extremity colors, hair growth, and conditions: Extremity Color: [Right:Hyperpigmented] Hair Growth on Extremity: [Right:No] Temperature of Extremity: [Right:Warm] Capillary Refill: [Right:< 3 seconds] Electronic Signature(s) Signed: 08/22/2015 3:47:52 PM By: Curtis Sites Entered By: Curtis Sites on 08/22/2015 14:01:23 [name] ([id number]) -------------------------------------------------------------------------------- Multi Wound Chart Details Patient Name: [name] Date of Service: 08/22/2015 2:00 PM Medical  Record Number: [id number] Patient Account Number: [id number] Date of Birth/Sex: 21-Aug-1935 (80 y.o. Male) Treating RN: Curtis Sites Primary Care Physician: Terance Hart, DAVID Other Clinician: Referring Physician: Terance Hart, DAVID Treating Physician/Extender: Elayne Snare in Treatment: 31 Vital Signs Height(in): 68 Pulse(bpm): 68 Weight(lbs): Blood Pressure 140/54 (mmHg): Body Mass Index(BMI): Temperature(F): 98.1 Respiratory Rate 18 (breaths/min): Photos: [5:No Photos] [N/A:N/A] Wound Location: [5:Right Lower Leg - Medial] [N/A:N/A] Wounding Event: [5:Gradually Appeared] [N/A:N/A] Primary Etiology: [5:Venous Leg Ulcer] [N/A:N/A] Comorbid History: [5:Cataracts, Anemia, Hypertension, Osteoarthritis] [N/A:N/A] Date Acquired: [5:11/13/2014] [N/A:N/A] Weeks of Treatment: [5:31] [N/A:N/A] Wound Status: [5:Open] [N/A:N/A] Measurements L x W x D 3.7x1.3x0.2 [N/A:N/A] (cm) Area (cm) : [5:3.778] [N/A:N/A] Volume (cm) : [5:0.756] [N/A:N/A] % Reduction in Area: [5:96.30%] [N/A:N/A] % Reduction in Volume: 97.50% [N/A:N/A] Classification: [5:Full Thickness Without Exposed Support Structures] [N/A:N/A] Exudate Amount: [5:Medium] [N/A:N/A] Exudate Type: [5:Serosanguineous] [N/A:N/A] Exudate Color: [5:red, brown] [N/A:N/A] Wound Margin: [5:Distinct, outline attached] [N/A:N/A] Granulation Amount:  [5:Large (67-100%)] [N/A:N/A] Granulation Quality: [5:Pink, Pale] [N/A:N/A] Necrotic Amount: [5:Small (1-33%)] [N/A:N/A] Exposed Structures: [5:Fascia: No Fat: No Tendon: No Muscle: No] [N/A:N/A] Joint: No Bone: No Limited to Skin Breakdown Epithelialization: Medium (34-66%) N/A N/A Periwound Skin Texture: Edema: No N/A N/A Excoriation: No Induration: No Callus: No Crepitus: No Fluctuance: No Friable: No Rash: No Scarring: No Periwound Skin Moist: Yes N/A N/A Moisture: Maceration: No Dry/Scaly: No Periwound Skin Color: Atrophie Blanche: No N/A N/A Cyanosis: No Ecchymosis: No Erythema: No Hemosiderin Staining: No Mottled: No Pallor: No Rubor: No Temperature: No Abnormality N/A N/A Tenderness on No N/A N/A Palpation: Wound Preparation: Ulcer Cleansing: Other: N/A N/A soap and water Topical Anesthetic Applied: None, Other: lidocaine 4% Treatment Notes Electronic Signature(s) Signed: 08/22/2015 3:47:52 PM By: Curtis Sites Entered By: Curtis Sites on 08/22/2015 14:24:54 [name] ([id number]) -------------------------------------------------------------------------------- Multi-Disciplinary Care Plan Details Patient Name: [name], [name] Date of Service: 08/22/2015 2:00 PM Medical Record Number: [id number] Patient Account Number: [id number] Date of Birth/Sex: [birth year] (81 y.o. Male) Treating RN: Curtis Sites Primary Care Physician: Dorothey Baseman Other Clinician: Referring Physician: Terance Hart, DAVID Treating Physician/Extender: Elayne Snare in Treatment: 39 Active Inactive Orientation to the Wound Care Program Nursing Diagnoses: Knowledge deficit related to the wound healing center program Goals: Patient/caregiver will verbalize understanding of the Wound Healing Center Program Date Initiated: 01/15/2015 Goal Status: Active Interventions: Provide education on orientation to the wound center Notes: Venous Leg Ulcer Nursing  Diagnoses: Knowledge deficit related to disease process and management Potential for venous Insuffiency (use before diagnosis confirmed) Goals: Patient will maintain optimal edema control Date Initiated: 01/15/2015 Goal Status: Active Patient/caregiver will verbalize understanding of disease process and disease management Date Initiated: 01/15/2015 Goal Status: Active Verify adequate tissue perfusion prior to therapeutic compression application Date Initiated: 01/15/2015 Goal Status: Active Interventions: Assess peripheral edema status every visit. Compression as ordered Provide education on venous insufficiency [name], [name] ([id number]) Treatment Activities: Test ordered outside of clinic : 08/22/2015 Therapeutic compression applied : 08/22/2015 Venous Duplex Doppler : 08/22/2015 Notes: Wound/Skin Impairment Nursing Diagnoses: Impaired tissue integrity Knowledge deficit related to ulceration/compromised skin integrity Goals: Patient/caregiver will verbalize understanding of skin care regimen Date Initiated: 01/15/2015 Goal Status: Active Ulcer/skin breakdown will have a volume reduction of 30% by week 4 Date Initiated: 01/15/2015 Goal Status: Active Ulcer/skin breakdown will have a volume reduction of 50% by week 8 Date Initiated: 01/15/2015 Goal Status: Active Ulcer/skin breakdown will have a volume reduction of 80% by week 12 Date Initiated: 01/15/2015 Goal Status: Active Ulcer/skin breakdown will heal within 14 weeks  Date Initiated: 01/15/2015 Goal Status: Active Interventions: Assess patient/caregiver ability to perform ulcer/skin care regimen upon admission and as needed Assess ulceration(s) every visit Provide education on ulcer and skin care Notes: Electronic Signature(s) Signed: 08/22/2015 3:47:52 PM By: Curtis Sites Entered By: Curtis Sites on 08/22/2015 14:24:47 [name]  ([id number]) -------------------------------------------------------------------------------- Patient/Caregiver Education Details Patient Name: [name] Date of Service: 08/22/2015 2:00 PM Medical Record Number: [id number] Patient Account Number: [id number] Date of Birth/Gender: 02/17/1936 (80 y.o. Male) Treating RN: Curtis Sites Primary Care Physician: Dorothey Baseman Other Clinician: Referring Physician: Terance Hart DAVID Treating Physician/Extender: Elayne Snare in Treatment: 63 Education Assessment Education Provided To: Patient Education Topics Provided Wound/Skin Impairment: Handouts: Other: do not get wrap wet Methods: Demonstration, Explain/Verbal Responses: State content correctly Electronic Signature(s) Signed: 08/23/2015 4:59:51 PM By: Alejandro Mulling Entered By: Alejandro Mulling on 08/22/2015 15:40:07 [name] ([id number]) -------------------------------------------------------------------------------- Wound Assessment Details Patient Name: [name] Date of Service: 08/22/2015 2:00 PM Medical Record Number: [id number] Patient Account Number: [id number] Date of Birth/Sex: February 28, 1936 (80 y.o. Male) Treating RN: Curtis Sites Primary Care Physician: Terance Hart, DAVID Other Clinician: Referring Physician: Terance Hart, DAVID Treating Physician/Extender: Elayne Snare in Treatment: 31 Wound Status Wound Number: 5 Primary Venous Leg Ulcer Etiology: Wound Location: Right Lower Leg - Medial Wound Status: Open Wounding Event: Gradually Appeared Comorbid Cataracts, Anemia, Hypertension, Date Acquired: 11/13/2014 History: Osteoarthritis Weeks Of Treatment: 31 Clustered Wound: No Photos Photo Uploaded By: Curtis Sites on 08/22/2015 15:47:20 Wound Measurements Length: (cm) 3.7 Width: (cm) 1.3 Depth: (cm) 0.2 Area: (cm) 3.778 Volume: (cm) 0.756 % Reduction in Area: 96.3% % Reduction in Volume: 97.5% Epithelialization:  Medium (34-66%) Tunneling: No Undermining: No Wound Description Full Thickness Without Exposed Classification: Support Structures Wound Margin: Distinct, outline attached Exudate Medium Amount: Exudate Type: Serosanguineous Exudate Color: red, brown Foul Odor After Cleansing: No Wound Bed Granulation Amount: Large (67-100%) Exposed Structure Granulation Quality: Pink, Pale Fascia Exposed: No Necrotic Amount: Small (1-33%) Fat Layer Exposed: No YUNIOR, JAIN ([id number]) Necrotic Quality: Adherent Slough Tendon Exposed: No Muscle Exposed: No Joint Exposed: No Bone Exposed: No Limited to Skin Breakdown Periwound Skin Texture Texture Color No Abnormalities Noted: No No Abnormalities Noted: No Callus: No Atrophie Blanche: No Crepitus: No Cyanosis: No Excoriation: No Ecchymosis: No Fluctuance: No Erythema: No Friable: No Hemosiderin Staining: No Induration: No Mottled: No Localized Edema: No Pallor: No Rash: No Rubor: No Scarring: No Temperature / Pain Moisture Temperature: No Abnormality No Abnormalities Noted: No Dry / Scaly: No Maceration: No Moist: Yes Wound Preparation Ulcer Cleansing: Other: soap and water, Topical Anesthetic Applied: None, Other: lidocaine 4%, Treatment Notes Wound #5 (Right, Medial Lower Leg) 1. Cleansed with: Cleanse wound with antibacterial soap and water 2. Anesthetic Topical Lidocaine 4% cream to wound bed prior to debridement 3. Peri-wound Care: Skin Prep 4. Dressing Applied: Mepitel 5. Secondary Dressing Applied ABD Pad 7. Secured with 4-Layer Compression System - Right Lower Extremity Notes puraply applied by Dr. Jimmey Ralph today also carboflex added to dressing DEMONTRAE, GILBERT ([id number]) Electronic Signature(s) Signed: 08/22/2015 3:47:52 PM By: Curtis Sites Entered By: Curtis Sites on 08/22/2015 14:08:59 [name]  ([id number]) -------------------------------------------------------------------------------- Vitals Details Patient Name: [name] Date of Service: 08/22/2015 2:00 PM Medical Record Number: [id number] Patient Account Number: [id number] Date of Birth/Sex: 09-15-1935 (80 y.o. Male) Treating RN: Curtis Sites Primary Care Physician: Dorothey Baseman Other Clinician: Referring Physician: Terance Hart, DAVID Treating Physician/Extender: Elayne Snare in Treatment: 31 Vital Signs Time Taken: 13:53 Temperature (F): 98.1  Height (in): 68 Pulse (bpm): 68 Respiratory Rate (breaths/min): 18 Blood Pressure (mmHg): 140/54 Reference Range: 80 - 120 mg / dl Electronic Signature(s) Signed: 08/22/2015 3:47:52 PM By: Curtis Sites Entered By: Curtis Sites on 08/22/2015 13:54:33

## 2015-08-29 ENCOUNTER — Encounter: Payer: Medicare Other | Admitting: Surgery

## 2015-08-29 DIAGNOSIS — I87311 Chronic venous hypertension (idiopathic) with ulcer of right lower extremity: Secondary | ICD-10-CM | POA: Diagnosis not present

## 2015-08-30 NOTE — Progress Notes (Signed)
JAARON, OLESON ([id number]) Visit Report for 08/29/2015 Arrival Information Details Patient Name: [name], [name] Date of Service: 08/29/2015 3:30 PM Medical Record Number: [id number] Patient Account Number: [id number] Date of Birth/Sex: 1936/05/02 (80 y.o. Male) Treating RN: Curtis Sites Primary Care Physician: Dorothey Baseman Other Clinician: Referring Physician: Terance Hart, DAVID Treating Physician/Extender: Rudene Re in Treatment: 32 Visit Information History Since Last Visit Added or deleted any medications: No Patient Arrived: Wheel Chair Any new allergies or adverse reactions: No Arrival Time: 15:41 Had a fall or experienced change in No activities of daily living that may affect Accompanied By: self risk of falls: Transfer Assistance: None Signs or symptoms of abuse/neglect since last No Patient Identification Verified: Yes visito Secondary Verification Process Yes Hospitalized since last visit: No Completed: Pain Present Now: No Patient Requires Transmission-Based No Precautions: Patient Has Alerts: No Electronic Signature(s) Signed: 08/29/2015 5:02:34 PM By: Curtis Sites Entered By: Curtis Sites on 08/29/2015 15:46:20 [name] ([id number]) -------------------------------------------------------------------------------- Encounter Discharge Information Details Patient Name: [name] Date of Service: 08/29/2015 3:30 PM Medical Record Number: [id number] Patient Account Number: [id number] Date of Birth/Sex: 1935/12/31 (80 y.o. Male) Treating RN: Curtis Sites Primary Care Physician: Dorothey Baseman Other Clinician: Referring Physician: Dorothey Baseman Treating Physician/Extender: Rudene Re in Treatment: 82 Encounter Discharge Information Items Discharge Pain Level: 0 Discharge Condition: Stable Ambulatory Status: Wheelchair Discharge Destination: Nursing Home Transportation: Private Auto Accompanied By:  self Schedule Follow-up Appointment: Yes Medication Reconciliation completed and provided to Patient/Care No Prathik Aman: Provided on Clinical Summary of Care: 08/29/2015 Form Type Recipient Paper Patient [place] Electronic Signature(s) Signed: 08/29/2015 4:43:38 PM By: Curtis Sites Previous Signature: 08/29/2015 4:25:49 PM Version By: Gwenlyn Perking Entered By: Curtis Sites on 08/29/2015 16:43:38 [place], Chrissie Noa ([id number]) -------------------------------------------------------------------------------- Lower Extremity Assessment Details Patient Name: [name] Date of Service: 08/29/2015 3:30 PM Medical Record Number: [id number] Patient Account Number: [id number] Date of Birth/Sex: [birth year] (80 y.o. Male) Treating RN: Curtis Sites Primary Care Physician: Terance Hart, DAVID Other Clinician: Referring Physician: Terance Hart, DAVID Treating Physician/Extender: Rudene Re in Treatment: 32 Edema Assessment Assessed: [Left: No] Franne Forts: No] Edema: [Left: Ye] [Right: s] Calf Left: Right: Point of Measurement: 40 cm From Medial Instep cm 37.2 cm Ankle Left: Right: Point of Measurement: 12 cm From Medial Instep cm 27.3 cm Vascular Assessment Pulses: Posterior Tibial Dorsalis Pedis Palpable: [Right:Yes] Extremity colors, hair growth, and conditions: Extremity Color: [Right:Hyperpigmented] Hair Growth on Extremity: [Right:No] Temperature of Extremity: [Right:Warm] Capillary Refill: [Right:< 3 seconds] Toe Nail Assessment Left: Right: Thick: Yes Discolored: Yes Deformed: Yes Improper Length and Hygiene: No Electronic Signature(s) Signed: 08/29/2015 5:02:34 PM By: Curtis Sites Entered By: Curtis Sites on 08/29/2015 15:56:01 [name] ([id number]) -------------------------------------------------------------------------------- Multi Wound Chart Details Patient Name: [name] Date of Service: 08/29/2015 3:30 PM Medical Record Number:  [id number] Patient Account Number: [id number] Date of Birth/Sex: [birth year] (80 y.o. Male) Treating RN: Curtis Sites Primary Care Physician: Dorothey Baseman Other Clinician: Referring Physician: Terance Hart, DAVID Treating Physician/Extender: Rudene Re in Treatment: 32 Vital Signs Height(in): 68 Pulse(bpm): 64 Weight(lbs): Blood Pressure 138/56 (mmHg): Body Mass Index(BMI): Temperature(F): 97.6 Respiratory Rate 18 (breaths/min): Photos: [5:No Photos] [N/A:N/A] Wound Location: [5:Right Lower Leg - Medial] [N/A:N/A] Wounding Event: [5:Gradually Appeared] [N/A:N/A] Primary Etiology: [5:Venous Leg Ulcer] [N/A:N/A] Comorbid History: [5:Cataracts, Anemia, Hypertension, Osteoarthritis] [N/A:N/A] Date Acquired: [5:11/13/2014] [N/A:N/A] Weeks of Treatment: [5:32] [N/A:N/A] Wound Status: [5:Open] [N/A:N/A] Measurements L x W x D 3.1x1x0.1 [N/A:N/A] (cm) Area (cm) : [5:2.435] [N/A:N/A] Volume (cm) : [5:0.243] [N/A:N/A] %  Reduction in Area: [5:97.60%] [N/A:N/A] % Reduction in Volume: 99.20% [N/A:N/A] Classification: [5:Full Thickness Without Exposed Support Structures] [N/A:N/A] Exudate Amount: [5:Medium] [N/A:N/A] Exudate Type: [5:Serosanguineous] [N/A:N/A] Exudate Color: [5:red, brown] [N/A:N/A] Wound Margin: [5:Distinct, outline attached] [N/A:N/A] Granulation Amount: [5:Large (67-100%)] [N/A:N/A] Granulation Quality: [5:Pink, Pale] [N/A:N/A] Necrotic Amount: [5:None Present (0%)] [N/A:N/A] Exposed Structures: [5:Fascia: No Fat: No Tendon: No Muscle: No] [N/A:N/A] Joint: No Bone: No Limited to Skin Breakdown Epithelialization: Medium (34-66%) N/A N/A Periwound Skin Texture: Edema: No N/A N/A Excoriation: No Induration: No Callus: No Crepitus: No Fluctuance: No Friable: No Rash: No Scarring: No Periwound Skin Moist: Yes N/A N/A Moisture: Maceration: No Dry/Scaly: No Periwound Skin Color: Atrophie Blanche: No N/A N/A Cyanosis: No Ecchymosis:  No Erythema: No Hemosiderin Staining: No Mottled: No Pallor: No Rubor: No Temperature: No Abnormality N/A N/A Tenderness on No N/A N/A Palpation: Wound Preparation: Ulcer Cleansing: Other: N/A N/A soap and water Topical Anesthetic Applied: None, Other: lidocaine 4% Procedures Performed: Cellular or Tissue Based N/A N/A Product Treatment Notes Electronic Signature(s) Signed: 08/29/2015 4:42:21 PM By: Curtis Sites Entered By: Curtis Sites on 08/29/2015 16:42:21 [name] ([id number]) -------------------------------------------------------------------------------- Multi-Disciplinary Care Plan Details Patient Name: CASPIAN, STALDER. Date of Service: 08/29/2015 3:30 PM Medical Record Number: [id number] Patient Account Number: [id number] Date of Birth/Sex: 05-15-1936 (80 y.o. Male) Treating RN: Curtis Sites Primary Care Physician: Dorothey Baseman Other Clinician: Referring Physician: Dorothey Baseman Treating Physician/Extender: Rudene Re in Treatment: 63 Active Inactive Orientation to the Wound Care Program Nursing Diagnoses: Knowledge deficit related to the wound healing center program Goals: Patient/caregiver will verbalize understanding of the Wound Healing Center Program Date Initiated: 01/15/2015 Goal Status: Active Interventions: Provide education on orientation to the wound center Notes: Venous Leg Ulcer Nursing Diagnoses: Knowledge deficit related to disease process and management Potential for venous Insuffiency (use before diagnosis confirmed) Goals: Patient will maintain optimal edema control Date Initiated: 01/15/2015 Goal Status: Active Patient/caregiver will verbalize understanding of disease process and disease management Date Initiated: 01/15/2015 Goal Status: Active Verify adequate tissue perfusion prior to therapeutic compression application Date Initiated: 01/15/2015 Goal Status: Active Interventions: Assess peripheral edema  status every visit. Compression as ordered Provide education on venous insufficiency SOCORRO, GARBERS ([id number]) Treatment Activities: Test ordered outside of clinic : 08/29/2015 Therapeutic compression applied : 08/29/2015 Venous Duplex Doppler : 08/29/2015 Notes: Wound/Skin Impairment Nursing Diagnoses: Impaired tissue integrity Knowledge deficit related to ulceration/compromised skin integrity Goals: Patient/caregiver will verbalize understanding of skin care regimen Date Initiated: 01/15/2015 Goal Status: Active Ulcer/skin breakdown will have a volume reduction of 30% by week 4 Date Initiated: 01/15/2015 Goal Status: Active Ulcer/skin breakdown will have a volume reduction of 50% by week 8 Date Initiated: 01/15/2015 Goal Status: Active Ulcer/skin breakdown will have a volume reduction of 80% by week 12 Date Initiated: 01/15/2015 Goal Status: Active Ulcer/skin breakdown will heal within 14 weeks Date Initiated: 01/15/2015 Goal Status: Active Interventions: Assess patient/caregiver ability to perform ulcer/skin care regimen upon admission and as needed Assess ulceration(s) every visit Provide education on ulcer and skin care Notes: Electronic Signature(s) Signed: 08/29/2015 4:42:14 PM By: Curtis Sites Entered By: Curtis Sites on 08/29/2015 16:42:14 [name] ([id number]) -------------------------------------------------------------------------------- Patient/Caregiver Education Details Patient Name: [name] Date of Service: 08/29/2015 3:30 PM Medical Record Number: [id number] Patient Account Number: [id number] Date of Birth/Gender: 1935-08-22 (80 y.o. Male) Treating RN: Curtis Sites Primary Care Physician: Dorothey Baseman Other Clinician: Referring Physician: Dorothey Baseman Treating Physician/Extender: Rudene Re in Treatment: 62 Education Assessment Education Provided To:  Patient Education Topics Provided Nutrition: Handouts:  Nutrition Methods: Explain/Verbal Responses: State content correctly Electronic Signature(s) Signed: 08/29/2015 4:43:57 PM By: Curtis Sites Entered By: Curtis Sites on 08/29/2015 16:43:56 Sabedra, Chrissie Noa ([id number]) -------------------------------------------------------------------------------- Wound Assessment Details Patient Name: RIVAN, SIORDIA. Date of Service: 08/29/2015 3:30 PM Medical Record Number: [id number] Patient Account Number: [id number] Date of Birth/Sex: [birth year] (80 y.o. Male) Treating RN: Curtis Sites Primary Care Physician: Dorothey Baseman Other Clinician: Referring Physician: Terance Hart, DAVID Treating Physician/Extender: Rudene Re in Treatment: 32 Wound Status Wound Number: 5 Primary Venous Leg Ulcer Etiology: Wound Location: Right Lower Leg - Medial Wound Status: Open Wounding Event: Gradually Appeared Comorbid Cataracts, Anemia, Hypertension, Date Acquired: 11/13/2014 History: Osteoarthritis Weeks Of Treatment: 32 Clustered Wound: No Photos Photo Uploaded By: Curtis Sites on 08/29/2015 16:46:39 Wound Measurements Length: (cm) 3.1 Width: (cm) 1 Depth: (cm) 0.1 Area: (cm) 2.435 Volume: (cm) 0.243 % Reduction in Area: 97.6% % Reduction in Volume: 99.2% Epithelialization: Medium (34-66%) Tunneling: No Undermining: No Wound Description Full Thickness Without Exposed Classification: Support Structures Wound Margin: Distinct, outline attached Exudate Medium Amount: Exudate Type: Serosanguineous Exudate Color: red, brown Foul Odor After Cleansing: No Wound Bed Granulation Amount: Large (67-100%) Exposed Structure Granulation Quality: Pink, Pale Fascia Exposed: No Necrotic Amount: None Present (0%) Fat Layer Exposed: No LERRY, CORDREY ([id number]) Tendon Exposed: No Muscle Exposed: No Joint Exposed: No Bone Exposed: No Limited to Skin Breakdown Periwound Skin Texture Texture Color No Abnormalities Noted:  No No Abnormalities Noted: No Callus: No Atrophie Blanche: No Crepitus: No Cyanosis: No Excoriation: No Ecchymosis: No Fluctuance: No Erythema: No Friable: No Hemosiderin Staining: No Induration: No Mottled: No Localized Edema: No Pallor: No Rash: No Rubor: No Scarring: No Temperature / Pain Moisture Temperature: No Abnormality No Abnormalities Noted: No Dry / Scaly: No Maceration: No Moist: Yes Wound Preparation Ulcer Cleansing: Other: soap and water, Topical Anesthetic Applied: None, Other: lidocaine 4%, Treatment Notes Wound #5 (Right, Medial Lower Leg) 1. Cleansed with: Cleanse wound with antibacterial soap and water 3. Peri-wound Care: Skin Prep 4. Dressing Applied: Mepitel Other dressing (specify in notes) 5. Secondary Dressing Applied ABD Pad 7. Secured with 4-Layer Compression System - Right Lower Extremity Notes puraply applied by Dr. Meyer Russel today carboflex Electronic Signature(s) MIKAH, POSS ([id number]) Signed: 08/29/2015 5:02:34 PM By: Curtis Sites Entered By: Curtis Sites on 08/29/2015 16:01:34 Kynan, Peasley Chrissie Noa ([id number]) -------------------------------------------------------------------------------- Vitals Details Patient Name: ABDOULIE, TIERCE. Date of Service: 08/29/2015 3:30 PM Medical Record Number: [id number] Patient Account Number: [id number] Date of Birth/Sex: 03-26-1936 (80 y.o. Male) Treating RN: Curtis Sites Primary Care Physician: Dorothey Baseman Other Clinician: Referring Physician: Terance Hart, DAVID Treating Physician/Extender: Rudene Re in Treatment: 32 Vital Signs Time Taken: 15:46 Temperature (F): 97.6 Height (in): 68 Pulse (bpm): 64 Respiratory Rate (breaths/min): 18 Blood Pressure (mmHg): 138/56 Reference Range: 80 - 120 mg / dl Electronic Signature(s) Signed: 08/29/2015 5:02:34 PM By: Curtis Sites Entered By: Curtis Sites on 08/29/2015 15:49:29

## 2015-08-30 NOTE — Progress Notes (Addendum)
[name], [name] ([id number]) Visit Report for 08/29/2015 Chief Complaint Document Details Patient Name: [name], [name] Date of Service: 08/29/2015 3:30 PM Medical Record Number: [id number] Patient Account Number: [id number] Date of Birth/Sex: 18-Sep-1935 (80 y.o. Male) Treating RN: Curtis Sites Primary Care Physician: Dorothey Baseman Other Clinician: Referring Physician: Dorothey Baseman Treating Physician/Extender: Rudene Re in Treatment: 11 Information Obtained from: Patient Chief Complaint Patient presents for treatment of an open ulcer due to venous insufficiency. The patient has had a open wound on his right medial ankle for at least 3 years and was last seen in the wound clinic in February 2015. He was lost to follow-up after that. Electronic Signature(s) Signed: 08/29/2015 3:51:49 PM By: Evlyn Kanner MD, FACS Entered By: Evlyn Kanner on 08/29/2015 15:51:49 DEWAUN, [name] ([id number]) -------------------------------------------------------------------------------- Cellular or Tissue Based Product Details Patient Name: [name], [name]. Date of Service: 08/29/2015 3:30 PM Medical Record Number: [id number] Patient Account Number: [id number] Date of Birth/Sex: 10-05-1935 (80 y.o. Male) Treating RN: Curtis Sites Primary Care Physician: Dorothey Baseman Other Clinician: Referring Physician: Dorothey Baseman Treating Physician/Extender: Rudene Re in Treatment: 32 Cellular or Tissue Based Wound #5 Right,Medial Lower Leg Product Type Applied to: Performed By: Physician Evlyn Kanner, MD Cellular or Tissue Based Other Product Type: Time-Out Taken: Yes Location: genitalia / hands / feet / multiple digits Wound Size (sq cm): 3.1 Product Size (sq cm): 56 Waste Size (sq cm): 53 Waste Reason: wound size Amount of Product Applied (sq cm): 3 Lot #: [id number] Expiration Date: 05/25/2017 Fenestrated: No Reconstituted: Yes Solution Type:  saline Solution Amount: 2ml Lot #: [id number] Solution Expiration 05/06/2017 Date: Secured: Yes Secured With: Steri-Strips Dressing Applied: Yes Primary Dressing: mepitel one Procedural Pain: 0 Post Procedural Pain: 0 Response to Treatment: Procedure was tolerated well Post Procedure Diagnosis Same as Pre-procedure Electronic Signature(s) Signed: 08/29/2015 4:29:21 PM By: Evlyn Kanner MD, FACS Entered By: Evlyn Kanner on 08/29/2015 16:29:21 [name], [name] ([id number]) -------------------------------------------------------------------------------- HPI Details Patient Name: [name] Date of Service: 08/29/2015 3:30 PM Medical Record Number: [id number] Patient Account Number: [id number] Date of Birth/Sex: 24-Aug-1935 (80 y.o. Male) Treating RN: Curtis Sites Primary Care Physician: Dorothey Baseman Other Clinician: Referring Physician: Dorothey Baseman Treating Physician/Extender: Rudene Re in Treatment: 32 History of Present Illness Location: right lower extremity ulceration Quality: Patient reports experiencing a dull pain to affected area(s). Severity: Patient states wound are getting worse. 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 Modifying Factors: Consults to this date include:has received treatment in the wound center in the past but stopped coming here since February 2015 Associated Signs and Symptoms: Patient reports having difficulty standing for long periods. HPI Description: 80 year old male who is known to have progressive weakness and has been in a nursing home for a while has had chronic lower extremity edema both legs and a large open wound on the right lower  extremity which is has at least for about 3-4 years. The patient was seen in the wound clinic before and has been treated until February 2015 when he was lost to follow-up. Past medical history is significant for hypertension, gout, venous stasis ulcers, Parkinson's [name], constipation. He's not been a diabetic  but his last hemoglobin A1c was 6 in March 2015. There is documentation that he's had endovenous ablation of bilateral varicose veins but we have no documentation about this and this may be done at least 4-5 years ago. 01/22/2015 -- he has his vascular test is scheduled for this afternoon. 01/29/2015 -- the patient's vascular test was canceled last week because he was unable to get onto the examining bed at [facility]. His Unna's boot was not applied either and the patient had a dressing placed by home health. Today when his dressing was removed we found a lot of maggots in his right lower extremity. 02/07/2015 -- Was seen in the vascular office by the PA and she noted that a bilateral venous duplex study did not show any DVT, SVT or reflux bilaterally. The patient was advised to continue with Unna's boot and would at some stage to require graduated compression stockings of the 20-30 mmHg variety. In the future lymphedema pumps would also be considered. 02/14/2015 -- his dressing is smelling quite a bit and there is a discoloration of the wound suggestive of an infection. Deep tissue cultures will be taken today. 02/21/2015 -- the culture is back and it has growth moderate growth of Proteus and gram-negative rods sensitive to sulfa, ampicillin, cephazolin, Zosyn. He is already on doxycycline and his wound is looking clean and hence we will continue with this. 02/28/2015 -- he's been doing fine still on his antibiotics and has no fresh issues. 03/07/2015 -- I was told that because he lives in a nursing home the Apligraf was denied by his insurance company. 03/14/2015 -- we are awaiting  samples and a trial of Puraply to be tried for his ulceration. 03/21/2015 -- his Apligraf was approved and he is here for his first application of Apligraf 03/28/2015 -- his wound has had quite a bit of secretion and needs to be changed and hence I will use the ZAYED, GRIFFIE. ([id number]) second application of Apligraf. 04/04/2015 -- he is here for a wound check but has a lot of secretions and hence his dressing needs to be taken down. 04/09/2015 -- he is here for his third application of Apligraf 04/18/2015 -- he is here for a wound check and though he does not have any overt infection he always has a foul odor to his leg. 04/25/2015 -- his wound has been doing much better and he has minimal drainage and the size is smaller. Of note he is on doxycycline. 05/02/2015 -- he is here for his fourth application of Apligraf 05/09/2015 -- he is here for a wound check and is bothered by a lot of odor from the wound. 05/16/2015 -- he has done very well, the odor and drainage is less with the wound dressing having been changed twice this week. He is here for his last application of Apligraf. 06/20/2015 -- an area superior to the previous wound has now opened up and this is a fairly superficial ulceration. 07/12/2015 -- he is here for his first application of Puraply. This is a sample and this is no charge. 07/18/2015 -- he is here for his second application of Puraply. This is a sample and this is no charge. 07/25/2015 -- he is here for his third application of Puraply. This is a sample and this is no charge. 08/01/2015 -- he is here for his fourth application of Puraply. This is a sample and this is no charge. 08/08/2015 -- he is here for his fifth application of Puraply and  this is a sample at no charge. 08/15/2015 -- he is here for his sixth application of Puraply and this is a sample at no charge. 08/29/2015 -- he is here for his eight application of Puraply and this is a sample at no  charge. Electronic Signature(s) Signed: 08/29/2015 3:52:36 PM By: Evlyn Kanner MD, FACS Entered By: Evlyn Kanner on 08/29/2015 15:52:36 IMAD, SHOSTAK ([id number]) -------------------------------------------------------------------------------- Physical Exam Details Patient Name: [name], [name] Date of Service: 08/29/2015 3:30 PM Medical Record Number: [id number] Patient Account Number: [id number] Date of Birth/Sex: Apr 13, 1936 (80 y.o. Male) Treating RN: Curtis Sites Primary Care Physician: Dorothey Baseman Other Clinician: Referring Physician: Terance Hart, DAVID Treating Physician/Extender: Rudene Re in Treatment: 32 Constitutional . Pulse regular. Respirations normal and unlabored. Afebrile. . Eyes Nonicteric. Reactive to light. Ears, Nose, Mouth, and Throat Lips, teeth, and gums WNL.Marland Kitchen Moist mucosa without lesions. Neck supple and nontender. No palpable supraclavicular or cervical adenopathy. Normal sized without goiter. Respiratory WNL. No retractions.. Cardiovascular Pedal Pulses WNL. No clubbing, cyanosis or edema. Lymphatic No adneopathy. No adenopathy. No adenopathy. Musculoskeletal Adexa without tenderness or enlargement.. Digits and nails w/o clubbing, cyanosis, infection, petechiae, ischemia, or inflammatory conditions.. Integumentary (Hair, Skin) No suspicious lesions. No crepitus or fluctuance. No peri-wound warmth or erythema. No masses.Marland Kitchen Psychiatric Judgement and insight Intact.. No evidence of depression, anxiety, or agitation.. Notes The wound is clean with minimal debris and he has healthy granulation tissue. This was sharply removed with moist saline gauze and he is ready for his next application of Puraply. Electronic Signature(s) Signed: 08/29/2015 3:53:43 PM By: Evlyn Kanner MD, FACS Entered By: Evlyn Kanner on 08/29/2015 15:53:43 Epic, [employer] Chrissie Noa  ([id number]) -------------------------------------------------------------------------------- Physician Orders Details Patient Name: SOSTENES, KAUFFMANN. Date of Service: 08/29/2015 3:30 PM Medical Record Number: [id number] Patient Account Number: [id number] Date of Birth/Sex: 1936-05-06 (80 y.o. 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 Skin Barriers/Peri-Wound Care Wound #5 Right,Medial Lower Leg o Moisturizing lotion Primary Wound Dressing Wound #5 Right,Medial Lower Leg o Other: - PuraPly applied by Dr Meyer Russel today in clinic Secondary Dressing Wound #5 Right,Medial Lower Leg o ABD pad - Carboflex and ABD; gauze to bolster o Mepitel One - over PuraPly Dressing Change Frequency Wound #5 Right,Medial Lower Leg o Change dressing every week Follow-up Appointments Wound #5 Right,Medial Lower Leg o Return Appointment in 1 week. Edema Control RANARD, HARTE ([id number]) Wound #5 Right,Medial Lower Leg o 4-Layer Compression System - Right Lower Extremity Electronic Signature(s) Signed: 08/29/2015 4:30:20 PM By: Evlyn Kanner MD, FACS Signed: 08/29/2015 5:02:34 PM By: Curtis Sites Entered By: Curtis Sites on 08/29/2015 16:27:45 Ehren, Berisha Chrissie Noa  ([id number]) -------------------------------------------------------------------------------- Problem List Details Patient Name: CARTER, KASSEL. Date of Service: 08/29/2015 3:30 PM Medical Record Number: [id number] Patient Account Number: [id number] Date of Birth/Sex: [birth year] (80 y.o. 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 Date of Service: 08/29/2015 3:30 PM Medical Record Number: [id number] Patient Account Number: [id number] Date of Birth/Sex: March 26, 1936 (80 y.o. Male) Treating RN: Curtis Sites Primary Care Physician: Dorothey Baseman Other Clinician: Referring Physician: Dorothey Baseman Treating Physician/Extender: Rudene Re in Treatment: 32 Subjective Chief Complaint Information obtained from Patient Patient presents for treatment of an open ulcer due to venous insufficiency. The patient has had a open wound on his right medial ankle for at least 3 years and was last seen in the wound clinic in February 2015. He was lost to follow-up after that. History of Present Illness (HPI) The following HPI  elements were documented for the patient's wound: Location: right lower extremity ulceration Quality: Patient reports experiencing a dull pain to affected area(s). Severity: Patient states wound are getting worse. 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 Modifying Factors: Consults to this date include:has received treatment in the wound center in the past but stopped coming here since February 2015 Associated Signs and Symptoms: Patient reports having difficulty standing for long periods. 80 year old male who is known to have progressive weakness and has been in a nursing home for a while has had chronic lower extremity edema both legs and a large open wound on the right lower extremity which is has at least for about 3-4 years. The patient was seen in the wound clinic before and has been treated until February 2015 when he was lost to follow-up. Past medical history is significant for hypertension, gout, venous stasis ulcers, Parkinson's [name], constipation. He's not been a diabetic but his last hemoglobin A1c was 6 in March 2015. There is documentation that he's had endovenous ablation of bilateral varicose veins but we have no documentation about this and this may be done at least 4-5 years ago. 01/22/2015 -- he has his vascular test is scheduled for this afternoon. 01/29/2015 -- the patient's vascular test was canceled last week because he was unable to get onto the examining bed at [facility]. His Unna's boot was not applied either and the patient had a dressing placed by home health. Today when his dressing was removed we found a lot of maggots in his right lower extremity. 02/07/2015 -- Was seen  in the vascular office by the PA and she noted that a bilateral venous duplex study did not show any DVT, SVT or reflux bilaterally. The patient was advised to continue with Unna's boot and would at some stage to require graduated compression stockings of the 20-30 mmHg variety. In the future lymphedema pumps would also be considered. 02/14/2015 -- his dressing is smelling quite a bit and there is a discoloration of the wound suggestive of an [name], [name] ([id number]) infection. Deep tissue cultures will  be taken today. 02/21/2015 -- the culture is back and it has growth moderate growth of Proteus and gram-negative rods sensitive to sulfa, ampicillin, cephazolin, Zosyn. He is already on doxycycline and his wound is looking clean and hence we will continue with this. 02/28/2015 -- he's been doing fine still on his antibiotics and has no fresh issues. 03/07/2015 -- I was told that because he lives in a nursing home the Apligraf was denied by his insurance company. 03/14/2015 -- we are awaiting samples and a trial of Puraply to be tried for his ulceration. 03/21/2015 -- his Apligraf was approved and he is here for his first application of Apligraf 03/28/2015 -- his wound has had quite a bit of secretion and needs to be changed and hence I will use the second application of Apligraf. 04/04/2015 -- he is here for a wound check but has a lot of secretions and hence his dressing needs to be taken down. 04/09/2015 -- he is here for his third application of Apligraf 04/18/2015 -- he is here for a wound check and though he does not have any overt infection he always has a foul odor to his leg. 04/25/2015 -- his wound has been doing much better and he has minimal drainage and the size is smaller. Of note he is on doxycycline. 05/02/2015 -- he is here for his fourth application of Apligraf 05/09/2015 -- he is here for a wound check and is bothered by a lot of odor from the wound. 05/16/2015 -- he  has done very well, the odor and drainage is less with the wound dressing having been changed twice this week. He is here for his last application of Apligraf. 06/20/2015 -- an area superior to the previous wound has now opened up and this is a fairly superficial ulceration. 07/12/2015 -- he is here for his first application of Puraply. This is a sample and this is no charge. 07/18/2015 -- he is here for his second application of Puraply. This is a sample and this is no charge. 07/25/2015 -- he is here for his third application of Puraply. This is a sample and this is no charge. 08/01/2015 -- he is here for his fourth application of Puraply. This is a sample and this is no charge. 08/08/2015 -- he is here for his fifth application of Puraply and this is a sample at no charge. 08/15/2015 -- he is here for his sixth application of Puraply and this is a sample at no charge. 08/29/2015 -- he is here for his eight application of Puraply and this is a sample at no charge. Objective Constitutional Pulse regular. Respirations normal and unlabored. Afebrile. Vitals Time Taken: 3:46 PM, Height: 68 in, Temperature: 97.6 F, Pulse: 64 bpm, Respiratory Rate: 18 breaths/min, Blood Pressure: 138/56 mmHg. Eyes [name], [name] ([id number]) Nonicteric. Reactive to light. Ears, Nose, Mouth, and Throat Lips, teeth, and gums WNL.Marland Kitchen Moist mucosa without lesions. Neck supple and nontender. No palpable supraclavicular or cervical adenopathy. Normal sized without goiter. Respiratory WNL. No retractions.. Cardiovascular Pedal Pulses WNL. No clubbing, cyanosis or edema. Lymphatic No adneopathy. No adenopathy. No adenopathy. Musculoskeletal Adexa without tenderness or enlargement.. Digits and nails w/o clubbing, cyanosis, infection, petechiae, ischemia, or inflammatory conditions.Marland Kitchen Psychiatric Judgement and insight Intact.. No evidence of depression, anxiety, or agitation.. General Notes: The wound is clean  with minimal debris and he has healthy granulation tissue. This was sharply removed with moist saline gauze and he is ready for his next application of Puraply. Integumentary (  Hair, Skin) No suspicious lesions. No crepitus or fluctuance. No peri-wound warmth or erythema. No masses.. Wound #5 status is Open. Original cause of wound was Gradually Appeared. The wound is located on the Right,Medial Lower Leg. The wound measures 3.1cm length x 1cm width x 0.1cm depth; 2.435cm^2 area and 0.243cm^3 volume. The wound is limited to skin breakdown. There is no tunneling or undermining noted. There is a medium amount of serosanguineous drainage noted. The wound margin is distinct with the outline attached to the wound base. There is large (67-100%) pink, pale granulation within the wound bed. There is no necrotic tissue within the wound bed. The periwound skin appearance exhibited: Moist. The periwound skin appearance did not exhibit: Callus, Crepitus, Excoriation, Fluctuance, Friable, Induration, Localized Edema, Rash, Scarring, Dry/Scaly, Maceration, Atrophie Blanche, Cyanosis, Ecchymosis, Hemosiderin Staining, Mottled, Pallor, Rubor, Erythema. Periwound temperature was noted as No Abnormality. 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 A skin graft procedure using a bioengineered skin substitute/cellular or tissue based product was performed by Evlyn Kanner, MD. Other was applied and secured with Steri-Strips. 3 sq cm of product was utilized and 53 sq cm was wasted due to wound size. Post Application, mepitel one was applied. A  Time Out was conducted prior to the start of the procedure. The procedure was tolerated well with a pain level of 0 throughout and a pain level of 0 following the procedure. Post procedure Diagnosis Wound #5: Same as Pre-Procedure . Plan Wound Cleansing: Wound #5 Right,Medial Lower Leg: Cleanse wound with mild soap and water No tub bath. Skin Barriers/Peri-Wound Care: Wound #5 Right,Medial Lower Leg: Moisturizing lotion Primary Wound Dressing: Wound #5 Right,Medial Lower Leg: Other: - PuraPly applied by Dr Meyer Russel today in clinic Secondary Dressing: Wound #5 Right,Medial Lower Leg: ABD pad - Carboflex and ABD; gauze to bolster Mepitel One - over PuraPly Dressing Change Frequency: Wound #5 Right,Medial Lower Leg: Change dressing every week Follow-up Appointments: Wound #5 Right,Medial Lower Leg: Return Appointment in 1 week. Edema Control: Wound #5 Right,Medial Lower Leg: [name], [name] ([id number]) 4-Layer Compression System - Right Lower Extremity After appropriately debriding the necrotic debris around the wound base with moist saline gauze his wound is clean. He has had his eighth application of Puraply applied and we will then bolstered in place and apply a 4-layer Profore. He will be back to see [place] next week. Electronic Signature(s) Signed: 08/29/2015 4:29:34 PM By: Evlyn Kanner MD, FACS Previous Signature: 08/29/2015 3:54:30 PM Version By: Evlyn Kanner MD, FACS Entered By: Evlyn Kanner on 08/29/2015 16:29:34 [name], [name] ([id number]) -------------------------------------------------------------------------------- SuperBill Details Patient Name: [name], [name] Date of Service: 08/29/2015 Medical Record Number: [id number] Patient Account Number: [id number] Date of Birth/Sex: 14-Sep-1935 (80 y.o. 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 E66.01 Morbid (severe) obesity due to excess calories I89.0 Lymphedema, not elsewhere classified Facility Procedures CPT4 Code: [id number] Description: 99213 - WOUND CARE VISIT-LEV 3  EST PT Modifier: Quantity: 1 Physician Procedures CPT4 Code Description: [id number] 15275 - WC PHYS SKIN SUB GRAFT FACE/NK/HF/G ICD-10 Description Diagnosis I87.311 Chronic venous hypertension (idiopathic) with ulcer of 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 E66.01 Morbid (severe)  obesity due to excess calories I89.0 Lymphedema, not elsewhere classified Modifier: right lower s of muscle Quantity: 1 extremity Electronic Signature(s) Signed: 08/30/2015 4:56:18 PM By: Evlyn Kanner MD, FACS Previous Signature: 08/29/2015 4:29:46 PM Version By: Evlyn Kanner MD, FACS Entered By: Francie Massing on 08/30/2015 08:55:03

## 2015-09-05 ENCOUNTER — Encounter: Payer: Medicare Other | Attending: Surgery | Admitting: Surgery

## 2015-09-05 DIAGNOSIS — L97313 Non-pressure chronic ulcer of right ankle with necrosis of muscle: Secondary | ICD-10-CM | POA: Insufficient documentation

## 2015-09-05 DIAGNOSIS — I87311 Chronic venous hypertension (idiopathic) with ulcer of right lower extremity: Secondary | ICD-10-CM | POA: Insufficient documentation

## 2015-09-05 DIAGNOSIS — I890 Lymphedema, not elsewhere classified: Secondary | ICD-10-CM | POA: Insufficient documentation

## 2015-09-05 DIAGNOSIS — G20 Parkinson's disease: Secondary | ICD-10-CM | POA: Insufficient documentation

## 2015-09-05 DIAGNOSIS — I10 Essential (primary) hypertension: Secondary | ICD-10-CM | POA: Diagnosis not present

## 2015-09-06 NOTE — Progress Notes (Addendum)
JAKIN, PAVAO ([id number]) Visit Report for 09/05/2015 Chief Complaint Document Details Patient Name: [name], [name] Date of Service: 09/05/2015 2:45 PM Medical Record Number: [id number] Patient Account Number: [id number] Date of Birth/Sex: Oct 02, 1935 (80 y.o. Male) Treating RN: Curtis Sites Primary Care Physician: Dorothey Baseman Other Clinician: Referring Physician: Dorothey Baseman Treating Physician/Extender: Rudene Re in Treatment: 22 Information Obtained from: Patient Chief Complaint Patient presents for treatment of an open ulcer due to venous insufficiency. The patient has had a open wound on his right medial ankle for at least 3 years and was last seen in the wound clinic in February 2015. He was lost to follow-up after that. Electronic Signature(s) Signed: 09/05/2015 2:57:28 PM By: Evlyn Kanner MD, FACS Entered By: Evlyn Kanner on 09/05/2015 14:57:28 [name], Mitrano Chrissie [name] ([id number]) -------------------------------------------------------------------------------- Cellular or Tissue Based Product Details Patient Name: [name], [name] Date of Service: 09/05/2015 2:45 PM Medical Record Number: [id number] Patient Account Number: [id number] Date of Birth/Sex: 1936-03-04 (80 y.o. Male) Treating RN: Curtis Sites Primary Care Physician: Dorothey Baseman Other Clinician: Referring Physician: Dorothey Baseman Treating Physician/Extender: Rudene Re in Treatment: 33 Cellular or Tissue Based Wound #5 Right,Medial Lower Leg Product Type Applied to: Performed By: Physician Evlyn Kanner, MD Cellular or Tissue Based Other Product Type: Time-Out Taken: Yes Location: genitalia / hands / feet / multiple digits Wound Size (sq cm): 2.4 Product Size (sq cm): 54 Waste Size (sq cm): 51 Waste Reason: wound size Amount of Product Applied (sq cm): 3 Lot #: [id number] Expiration Date: 06/07/2017 Fenestrated: No Reconstituted: Yes Solution Type:  saline Solution Amount: 2ml Lot #: [id number] Solution Expiration 05/06/2017 Date: Secured: Yes Secured With: Steri-Strips Dressing Applied: Yes Primary Dressing: mepitel one Procedural Pain: 0 Post Procedural Pain: 0 Response to Treatment: Procedure was tolerated well Post Procedure Diagnosis Same as Pre-procedure Electronic Signature(s) Signed: 09/05/2015 3:20:38 PM By: Evlyn Kanner MD, FACS Entered By: Evlyn Kanner on 09/05/2015 15:20:38 [name], [name] ([id number]) -------------------------------------------------------------------------------- HPI Details Patient Name: [name] Date of Service: 09/05/2015 2:45 PM Medical Record Number: [id number] Patient Account Number: [id number] Date of Birth/Sex: Mar 30, 1936 (80 y.o. Male) Treating RN: Curtis Sites Primary Care Physician: Dorothey Baseman Other Clinician: Referring Physician: Dorothey Baseman Treating Physician/Extender: Rudene Re in Treatment: 6 History of Present Illness Location: right lower extremity ulceration Quality: Patient reports experiencing a dull pain to affected area(s). Severity: Patient states wound are getting worse. 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 Modifying Factors: Consults to this date include:has received treatment in the wound center in the past but stopped coming here since February 2015 Associated Signs and Symptoms: Patient reports having difficulty standing for long periods. HPI Description: 80 year old male who is known to have progressive weakness and has been in a nursing home for a while has had chronic lower extremity edema both legs and a large open wound on the right lower  extremity which is has at least for about 3-4 years. The patient was seen in the wound clinic before and has been treated until February 2015 when he was lost to follow-up. Past medical history is significant for hypertension, gout, venous stasis ulcers, Parkinson's [name], constipation. He's not been a diabetic  but his last hemoglobin A1c was 6 in March 2015. There is documentation that he's had endovenous ablation of bilateral varicose veins but we have no documentation about this and this may be done at least 4-5 years ago. 01/22/2015 -- he has his vascular test is scheduled for this afternoon. 01/29/2015 -- the patient's vascular test was canceled last week because he was unable to get onto the examining bed at [facility]. His Unna's boot was not applied either and the patient had a dressing placed by home health. Today when his dressing was removed we found a lot of maggots in his right lower extremity. 02/07/2015 -- Was seen in the vascular office by the PA and she noted that a bilateral venous duplex study did not show any DVT, SVT or reflux bilaterally. The patient was advised to continue with Unna's boot and would at some stage to require graduated compression stockings of the 20-30 mmHg variety. In the future lymphedema pumps would also be considered. 02/14/2015 -- his dressing is smelling quite a bit and there is a discoloration of the wound suggestive of an infection. Deep tissue cultures will be taken today. 02/21/2015 -- the culture is back and it has growth moderate growth of Proteus and gram-negative rods sensitive to sulfa, ampicillin, cephazolin, Zosyn. He is already on doxycycline and his wound is looking clean and hence we will continue with this. 02/28/2015 -- he's been doing fine still on his antibiotics and has no fresh issues. 03/07/2015 -- I was told that because he lives in a nursing home the Apligraf was denied by his insurance company. 03/14/2015 -- we are awaiting  samples and a trial of Puraply to be tried for his ulceration. 03/21/2015 -- his Apligraf was approved and he is here for his first application of Apligraf 03/28/2015 -- his wound has had quite a bit of secretion and needs to be changed and hence I will use the NEVAN, CREIGHTON. ([id number]) second application of Apligraf. 04/04/2015 -- he is here for a wound check but has a lot of secretions and hence his dressing needs to be taken down. 04/09/2015 -- he is here for his third application of Apligraf 04/18/2015 -- he is here for a wound check and though he does not have any overt infection he always has a foul odor to his leg. 04/25/2015 -- his wound has been doing much better and he has minimal drainage and the size is smaller. Of note he is on doxycycline. 05/02/2015 -- he is here for his fourth application of Apligraf 05/09/2015 -- he is here for a wound check and is bothered by a lot of odor from the wound. 05/16/2015 -- he has done very well, the odor and drainage is less with the wound dressing having been changed twice this week. He is here for his last application of Apligraf. 06/20/2015 -- an area superior to the previous wound has now opened up and this is a fairly superficial ulceration. 07/12/2015 -- he is here for his first application of Puraply. This is a sample and this is no charge. 07/18/2015 -- he is here for his second application of Puraply. This is a sample and this is no charge. 07/25/2015 -- he is here for his third application of Puraply. This is a sample and this is no charge. 08/01/2015 -- he is here for his fourth application of Puraply. This is a sample and this is no charge. 08/08/2015 -- he is here for his fifth application of Puraply and  this is a sample at no charge. 08/15/2015 -- he is here for his sixth application of Puraply and this is a sample at no charge. 08/29/2015 -- he is here for his eight application of Puraply and this is a sample at no  charge. 09/05/2015 -- he is here for his nineth application of Puraply and this is a sample at no charge. Electronic Signature(s) Signed: 09/05/2015 2:58:08 PM By: Evlyn Kanner MD, FACS Entered By: Evlyn Kanner on 09/05/2015 14:58:07 [name], [name] ([id number]) -------------------------------------------------------------------------------- Physical Exam Details Patient Name: DINNIS, ROG. Date of Service: 09/05/2015 2:45 PM Medical Record Number: [id number] Patient Account Number: [id number] Date of Birth/Sex: 1936/03/18 (80 y.o. Male) Treating RN: Curtis Sites Primary Care Physician: Dorothey Baseman Other Clinician: Referring Physician: Terance Hart, DAVID Treating Physician/Extender: Rudene Re in Treatment: 33 Constitutional . Pulse regular. Respirations normal and unlabored. Afebrile. . Eyes Nonicteric. Reactive to light. Ears, Nose, Mouth, and Throat Lips, teeth, and gums WNL.Marland Kitchen Moist mucosa without lesions. Neck supple and nontender. No palpable supraclavicular or cervical adenopathy. Normal sized without goiter. Respiratory WNL. No retractions.. Cardiovascular Pedal Pulses WNL. No clubbing, cyanosis or edema. Lymphatic No adneopathy. No adenopathy. No adenopathy. Musculoskeletal Adexa without tenderness or enlargement.. Digits and nails w/o clubbing, cyanosis, infection, petechiae, ischemia, or inflammatory conditions.. Integumentary (Hair, Skin) No suspicious lesions. No crepitus or fluctuance. No peri-wound warmth or erythema. No masses.Marland Kitchen Psychiatric Judgement and insight Intact.. No evidence of depression, anxiety, or agitation.. Notes the wound has gotten quite small and is clean and after washing it well with saline and gauze is ready for his next application of the Puraply. Electronic Signature(s) Signed: 09/05/2015 3:23:26 PM By: Evlyn Kanner MD, FACS Entered By: Evlyn Kanner on 09/05/2015 15:23:25 [name], [name]  ([id number]) -------------------------------------------------------------------------------- Physician Orders Details Patient Name: [name], [name] Date of Service: 09/05/2015 2:45 PM Medical Record Number: [id number] Patient Account Number: [id number] Date of Birth/Sex: [birth year] (80 y.o. 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 Skin Barriers/Peri-Wound Care Wound #5 Right,Medial Lower Leg o Moisturizing lotion Primary Wound Dressing Wound #5 Right,Medial Lower Leg o Other: - PuraPly applied by Dr Meyer Russel today in clinic Secondary Dressing Wound #5 Right,Medial Lower Leg o ABD pad - Carboflex and ABD; gauze to bolster o Mepitel One - over PuraPly Dressing Change Frequency Wound #5 Right,Medial Lower Leg o Change dressing every week Follow-up Appointments Wound #5 Right,Medial Lower Leg o Return Appointment in 1 week. Edema Control [name], [name] ([id number]) Wound #5 Right,Medial Lower Leg o 4-Layer Compression System - Right Lower Extremity Electronic Signature(s) Signed: 09/05/2015 4:53:38 PM By: Evlyn Kanner MD, FACS Signed: 09/05/2015 5:39:18 PM By: Curtis Sites Entered By: Curtis Sites on 09/05/2015 15:19:55 Quindon, Denker Chrissie [name]  ([id number]) -------------------------------------------------------------------------------- Problem List Details Patient Name: [name], [name] Date of Service: 09/05/2015 2:45 PM Medical Record Number: [id number] Patient Account Number: [id number] Date of Birth/Sex: Nov 20, 1935 (80 y.o. 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 Date of Service: 09/05/2015 2:45 PM Medical Record Number: [id number] Patient Account Number: [id number] Date of Birth/Sex: 10-26-1935 (80 y.o. Male) Treating RN: Curtis Sites Primary Care Physician: Dorothey Baseman Other Clinician: Referring Physician: Dorothey Baseman Treating Physician/Extender: Rudene Re in Treatment: 69 Subjective Chief Complaint Information obtained from Patient Patient presents for treatment of an open ulcer due to venous insufficiency. The patient has had a open wound on his right medial ankle for at least 3 years and was last seen in the wound clinic in February 2015. He was lost to follow-up after that. History of Present Illness (HPI) The following HPI  elements were documented for the patient's wound: Location: right lower extremity ulceration Quality: Patient reports experiencing a dull pain to affected area(s). Severity: Patient states wound are getting worse. 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 Modifying Factors: Consults to this date include:has received treatment in the wound center in the past but stopped coming here since February 2015 Associated Signs and Symptoms: Patient reports having difficulty standing for long periods. 80 year old male who is known to have progressive weakness and has been in a nursing home for a while has had chronic lower extremity edema both legs and a large open wound on the right lower extremity which is has at least for about 3-4 years. The patient was seen in the wound clinic before and has been treated until February 2015 when he was lost to follow-up. Past medical history is significant for hypertension, gout, venous stasis ulcers, Parkinson's [name], constipation. He's not been a diabetic but his last hemoglobin A1c was 6 in March 2015. There is documentation that he's had endovenous ablation of bilateral varicose veins but we have no documentation about this and this may be done at least 4-5 years ago. 01/22/2015 -- he has his vascular test is scheduled for this afternoon. 01/29/2015 -- the patient's vascular test was canceled last week because he was unable to get onto the examining bed at [facility]. His Unna's boot was not applied either and the patient had a dressing placed by home health. Today when his dressing was removed we found a lot of maggots in his right lower extremity. 02/07/2015 -- Was seen  in the vascular office by the PA and she noted that a bilateral venous duplex study did not show any DVT, SVT or reflux bilaterally. The patient was advised to continue with Unna's boot and would at some stage to require graduated compression stockings of the 20-30 mmHg variety. In the future lymphedema pumps would also be considered. 02/14/2015 -- his dressing is smelling quite a bit and there is a discoloration  of the wound suggestive of an [name], [name] ([id number]) infection. Deep tissue cultures will be taken today. 02/21/2015 -- the culture is back and it has growth moderate growth of Proteus and gram-negative rods sensitive to sulfa, ampicillin, cephazolin, Zosyn. He is already on doxycycline and his wound is looking clean and hence we will continue with this. 02/28/2015 -- he's been doing fine still on his antibiotics and has no fresh issues. 03/07/2015 -- I was told that because he lives in a nursing home the Apligraf was denied by his insurance company. 03/14/2015 -- we are awaiting samples and a trial of Puraply to be tried for his ulceration. 03/21/2015 -- his Apligraf was approved and he is here for his first application of Apligraf 03/28/2015 -- his wound has had quite a bit of secretion and needs to be changed and hence I will use the second application of Apligraf. 04/04/2015 -- he is here for a wound check but has a lot of secretions and hence his dressing needs to be taken down. 04/09/2015 -- he is here for his third application of Apligraf 04/18/2015 -- he is here for a wound check and though he does not have any overt infection he always has a foul odor to his leg. 04/25/2015 -- his wound has been doing much better and he has minimal drainage and the size is smaller. Of note he is on doxycycline. 05/02/2015 -- he is here for his fourth application of Apligraf 05/09/2015 -- he is here for a wound check and is bothered by a lot of odor from the wound. 05/16/2015 -- he  has done very well, the odor and drainage is less with the wound dressing having been changed twice this week. He is here for his last application of Apligraf. 06/20/2015 -- an area superior to the previous wound has now opened up and this is a fairly superficial ulceration. 07/12/2015 -- he is here for his first application of Puraply. This is a sample and this is no charge. 07/18/2015 -- he is here for his second application of Puraply. This is a sample and this is no charge. 07/25/2015 -- he is here for his third application of Puraply. This is a sample and this is no charge. 08/01/2015 -- he is here for his fourth application of Puraply. This is a sample and this is no charge. 08/08/2015 -- he is here for his fifth application of Puraply and this is a sample at no charge. 08/15/2015 -- he is here for his sixth application of Puraply and this is a sample at no charge. 08/29/2015 -- he is here for his eight application of Puraply and this is a sample at no charge. 09/05/2015 -- he is here for his nineth application of Puraply and this is a sample at no charge. Objective Constitutional Pulse regular. Respirations normal and unlabored. Afebrile. Vitals Time Taken: 2:58 PM, Height: 68 in, Temperature: 97.8 F, Pulse: 65 bpm, Respiratory Rate: 20 breaths/min, Blood Pressure: 148/79 mmHg. [name], [name] ([id number]) Eyes Nonicteric. Reactive to light. Ears, Nose, Mouth, and Throat Lips, teeth, and gums WNL.Marland Kitchen Moist mucosa without lesions. Neck supple and nontender. No palpable supraclavicular or cervical adenopathy. Normal sized without goiter. Respiratory WNL. No retractions.. Cardiovascular Pedal Pulses WNL. No clubbing, cyanosis or edema. Lymphatic No adneopathy. No adenopathy. No adenopathy. Musculoskeletal Adexa without tenderness or enlargement.. Digits and nails w/o clubbing, cyanosis, infection, petechiae, ischemia, or inflammatory conditions.Marland Kitchen Psychiatric Judgement and  insight Intact.. No evidence of depression, anxiety, or agitation.Marland Kitchen  General Notes: the wound has gotten quite small and is clean and after washing it well with saline and gauze is ready for his next application of the Puraply. Integumentary (Hair, Skin) No suspicious lesions. No crepitus or fluctuance. No peri-wound warmth or erythema. No masses.. Wound #5 status is Open. Original cause of wound was Gradually Appeared. The wound is located on the Right,Medial Lower Leg. The wound measures 3cm length x 0.8cm width x 0.1cm depth; 1.885cm^2 area and 0.188cm^3 volume. The wound is limited to skin breakdown. There is no tunneling or undermining noted. There is a medium amount of serosanguineous drainage noted. The wound margin is distinct with the outline attached to the wound base. There is large (67-100%) pink, pale granulation within the wound bed. There is no necrotic tissue within the wound bed. The periwound skin appearance exhibited: Moist. The periwound skin appearance did not exhibit: Callus, Crepitus, Excoriation, Fluctuance, Friable, Induration, Localized Edema, Rash, Scarring, Dry/Scaly, Maceration, Atrophie Blanche, Cyanosis, Ecchymosis, Hemosiderin Staining, Mottled, Pallor, Rubor, Erythema. Periwound temperature was noted as No Abnormality. 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 A skin graft procedure using a bioengineered skin substitute/cellular or tissue based product was performed by Evlyn Kanner, MD. Other was applied and secured with Steri-Strips. 3 sq cm of product was utilized and 51 sq cm was  wasted due to wound size. Post Application, mepitel one was applied. A Time Out was conducted prior to the start of the procedure. The procedure was tolerated well with a pain level of 0 throughout and a pain level of 0 following the procedure. Post procedure Diagnosis Wound #5: Same as Pre-Procedure . Plan Wound Cleansing: Wound #5 Right,Medial Lower Leg: Cleanse wound with mild soap and water No tub bath. Skin Barriers/Peri-Wound Care: Wound #5 Right,Medial Lower Leg: Moisturizing lotion Primary Wound Dressing: Wound #5 Right,Medial Lower Leg: Other: - PuraPly applied by Dr Meyer Russel today in clinic Secondary Dressing: Wound #5 Right,Medial Lower Leg: ABD pad - Carboflex and ABD; gauze to bolster Mepitel One - over PuraPly Dressing Change Frequency: Wound #5 Right,Medial Lower Leg: Change dressing every week Follow-up Appointments: Wound #5 Right,Medial Lower Leg: Return Appointment in 1 week. Edema Control: [name], PEREZPEREZ ([id number]) Wound #5 Right,Medial Lower Leg: 4-Layer Compression System - Right Lower Extremity He has had his nineth application of Puraply applied and we will then bolstered in place and apply a 4-layer Profore. He will be back to see [place] next week. Electronic Signature(s) Signed: 09/05/2015 3:24:40 PM By: Evlyn Kanner MD, FACS Entered By: Evlyn Kanner on 09/05/2015 15:24:40 RAMIEL, FORTI ([id number]) -------------------------------------------------------------------------------- SuperBill Details Patient Name: JACY, HOWAT. Date of Service: 09/05/2015 Medical Record Number: [id number] Patient Account Number: [id number] Date of Birth/Sex: 1935-12-28 (80 y.o. 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 E66.01 Morbid (severe) obesity due to excess calories I89.0 Lymphedema, not elsewhere classified Facility Procedures CPT4 Code Description: [id number] - SKIN SUB GRAFT  FACE/NK/HF/G ICD-10 Description Diagnosis I87.311 Chronic venous hypertension (idiopathic) with ulcer of I89.0 Lymphedema, not elsewhere classified L97.313 Non-pressure chronic ulcer of right ankle  with necrosi Modifier: right lower s of muscle Quantity: 1 extremity Physician Procedures CPT4 Code Description: [id number] - WC PHYS SKIN SUB GRAFT FACE/NK/HF/G ICD-10 Description Diagnosis I87.311 Chronic venous hypertension (idiopathic) with ulcer of I89.0 Lymphedema, not elsewhere classified L97.313 Non-pressure chronic ulcer of right  ankle with necrosis Modifier: right lower of muscle Quantity: 1 extremity Electronic Signature(s) Signed: 09/05/2015 3:25:03 PM By: Evlyn Kanner MD, FACS Entered By: Evlyn Kanner on 09/05/2015 15:25:03

## 2015-09-06 NOTE — Progress Notes (Signed)
DILAN, NOVOSAD ([id number]) Visit Report for 09/05/2015 Arrival Information Details Patient Name: [name], [name] Date of Service: 09/05/2015 2:45 PM Medical Record Number: [id number] Patient Account Number: [id number] Date of Birth/Sex: 28-Nov-1935 (80 y.o. Male) Treating RN: Curtis Sites Primary Care Physician: Dorothey Baseman Other Clinician: Referring Physician: Terance Hart, DAVID Treating Physician/Extender: Rudene Re in Treatment: 32 Visit Information History Since Last Visit Added or deleted any medications: No Patient Arrived: Wheel Chair Any new allergies or adverse reactions: No Arrival Time: 14:48 Had a fall or experienced change in No activities of daily living that may affect Accompanied By: self risk of falls: Transfer Assistance: None Signs or symptoms of abuse/neglect since last No Patient Identification Verified: Yes visito Secondary Verification Process Yes Hospitalized since last visit: No Completed: Pain Present Now: No Patient Requires Transmission-Based No Precautions: Patient Has Alerts: No Electronic Signature(s) Signed: 09/05/2015 5:39:18 PM By: Curtis Sites Entered By: Curtis Sites on 09/05/2015 14:50:10 [name] ([id number]) -------------------------------------------------------------------------------- Encounter Discharge Information Details Patient Name: [name] Date of Service: 09/05/2015 2:45 PM Medical Record Number: [id number] Patient Account Number: [id number] Date of Birth/Sex: 01/12/1936 (80 y.o. Male) Treating RN: Curtis Sites Primary Care Physician: Dorothey Baseman Other Clinician: Referring Physician: Dorothey Baseman Treating Physician/Extender: Rudene Re in Treatment: 62 Encounter Discharge Information Items Discharge Pain Level: 0 Discharge Condition: Stable Ambulatory Status: Wheelchair Discharge Destination: Nursing Home Transportation: Private Auto Accompanied By:  self Schedule Follow-up Appointment: Yes Medication Reconciliation completed No and provided to Patient/Care Brayla Pat: Provided on Clinical Summary of Care: 09/05/2015 Form Type Recipient Paper Patient [facility] Electronic Signature(s) Signed: 09/05/2015 4:08:46 PM By: Curtis Sites Previous Signature: 09/05/2015 3:38:45 PM Version By: Gwenlyn Perking Entered By: Curtis Sites on 09/05/2015 16:08:46 [name] ([id number]) -------------------------------------------------------------------------------- Lower Extremity Assessment Details Patient Name: [name] Date of Service: 09/05/2015 2:45 PM Medical Record Number: [id number] Patient Account Number: [id number] Date of Birth/Sex: 12-30-1935 (80 y.o. Male) Treating RN: Curtis Sites Primary Care Physician: Terance Hart, DAVID Other Clinician: Referring Physician: Terance Hart, DAVID Treating Physician/Extender: Rudene Re in Treatment: 33 Edema Assessment Assessed: [Left: No] [Right: No] Edema: [Left: Ye] [Right: s] Calf Left: Right: Point of Measurement: 40 cm From Medial Instep cm 37.5 cm Ankle Left: Right: Point of Measurement: 12 cm From Medial Instep cm 27.6 cm Vascular Assessment Pulses: Posterior Tibial Dorsalis Pedis Palpable: [Right:Yes] Extremity colors, hair growth, and conditions: Extremity Color: [Right:Hyperpigmented] Hair Growth on Extremity: [Right:No] Temperature of Extremity: [Right:Warm] Capillary Refill: [Right:< 3 seconds] Toe Nail Assessment Left: Right: Thick: Yes Discolored: Yes Deformed: Yes Improper Length and Hygiene: No Electronic Signature(s) Signed: 09/05/2015 5:39:18 PM By: Curtis Sites Entered By: Curtis Sites on 09/05/2015 15:06:47 [name] ([id number]) -------------------------------------------------------------------------------- Multi Wound Chart Details Patient Name: [name] Date of Service: 09/05/2015 2:45 PM Medical Record Number:  [id number] Patient Account Number: [id number] Date of Birth/Sex: 09/01/1935 (80 y.o. Male) Treating RN: Curtis Sites Primary Care Physician: Dorothey Baseman Other Clinician: Referring Physician: Terance Hart, DAVID Treating Physician/Extender: Rudene Re in Treatment: 33 Vital Signs Height(in): 68 Pulse(bpm): 65 Weight(lbs): Blood Pressure 148/79 (mmHg): Body Mass Index(BMI): Temperature(F): 97.8 Respiratory Rate 20 (breaths/min): Photos: [5:No Photos] [N/A:N/A] Wound Location: [5:Right Lower Leg - Medial] [N/A:N/A] Wounding Event: [5:Gradually Appeared] [N/A:N/A] Primary Etiology: [5:Venous Leg Ulcer] [N/A:N/A] Comorbid History: [5:Cataracts, Anemia, Hypertension, Osteoarthritis] [N/A:N/A] Date Acquired: [5:11/13/2014] [N/A:N/A] Weeks of Treatment: [5:33] [N/A:N/A] Wound Status: [5:Open] [N/A:N/A] Measurements L x W x D 3x0.8x0.1 [N/A:N/A] (cm) Area (cm) : [5:1.885] [N/A:N/A] Volume (cm) : [5:0.188] [N/A:N/A] %  Reduction in Area: [5:98.20%] [N/A:N/A] % Reduction in Volume: 99.40% [N/A:N/A] Classification: [5:Full Thickness Without Exposed Support Structures] [N/A:N/A] Exudate Amount: [5:Medium] [N/A:N/A] Exudate Type: [5:Serosanguineous] [N/A:N/A] Exudate Color: [5:red, brown] [N/A:N/A] Wound Margin: [5:Distinct, outline attached] [N/A:N/A] Granulation Amount: [5:Large (67-100%)] [N/A:N/A] Granulation Quality: [5:Pink, Pale] [N/A:N/A] Necrotic Amount: [5:None Present (0%)] [N/A:N/A] Exposed Structures: [5:Fascia: No Fat: No Tendon: No Muscle: No] [N/A:N/A] Joint: No Bone: No Limited to Skin Breakdown Epithelialization: Medium (34-66%) N/A N/A Periwound Skin Texture: Edema: No N/A N/A Excoriation: No Induration: No Callus: No Crepitus: No Fluctuance: No Friable: No Rash: No Scarring: No Periwound Skin Moist: Yes N/A N/A Moisture: Maceration: No Dry/Scaly: No Periwound Skin Color: Atrophie Blanche: No N/A N/A Cyanosis: No Ecchymosis:  No Erythema: No Hemosiderin Staining: No Mottled: No Pallor: No Rubor: No Temperature: No Abnormality N/A N/A Tenderness on No N/A N/A Palpation: Wound Preparation: Ulcer Cleansing: Other: N/A N/A soap and water Topical Anesthetic Applied: None, Other: lidocaine 4% Treatment Notes Electronic Signature(s) Signed: 09/05/2015 5:39:18 PM By: Curtis Sites Entered By: Curtis Sites on 09/05/2015 15:16:40 [name] ([id number]) -------------------------------------------------------------------------------- Multi-Disciplinary Care Plan Details Patient Name: [name], [name] Date of Service: 09/05/2015 2:45 PM Medical Record Number: [id number] Patient Account Number: [id number] Date of Birth/Sex: 04-22-1936 (80 y.o. Male) Treating RN: Curtis Sites Primary Care Physician: Dorothey Baseman Other Clinician: Referring Physician: Dorothey Baseman Treating Physician/Extender: Rudene Re in Treatment: 44 Active Inactive Orientation to the Wound Care Program Nursing Diagnoses: Knowledge deficit related to the wound healing center program Goals: Patient/caregiver will verbalize understanding of the Wound Healing Center Program Date Initiated: 01/15/2015 Goal Status: Active Interventions: Provide education on orientation to the wound center Notes: Venous Leg Ulcer Nursing Diagnoses: Knowledge deficit related to disease process and management Potential for venous Insuffiency (use before diagnosis confirmed) Goals: Patient will maintain optimal edema control Date Initiated: 01/15/2015 Goal Status: Active Patient/caregiver will verbalize understanding of disease process and disease management Date Initiated: 01/15/2015 Goal Status: Active Verify adequate tissue perfusion prior to therapeutic compression application Date Initiated: 01/15/2015 Goal Status: Active Interventions: Assess peripheral edema status every visit. Compression as ordered Provide education on  venous insufficiency [name], [name] ([id number]) Treatment Activities: Test ordered outside of clinic : 08/29/2015 Therapeutic compression applied : 08/29/2015 Venous Duplex Doppler : 08/29/2015 Notes: Wound/Skin Impairment Nursing Diagnoses: Impaired tissue integrity Knowledge deficit related to ulceration/compromised skin integrity Goals: Patient/caregiver will verbalize understanding of skin care regimen Date Initiated: 01/15/2015 Goal Status: Active Ulcer/skin breakdown will have a volume reduction of 30% by week 4 Date Initiated: 01/15/2015 Goal Status: Active Ulcer/skin breakdown will have a volume reduction of 50% by week 8 Date Initiated: 01/15/2015 Goal Status: Active Ulcer/skin breakdown will have a volume reduction of 80% by week 12 Date Initiated: 01/15/2015 Goal Status: Active Ulcer/skin breakdown will heal within 14 weeks Date Initiated: 01/15/2015 Goal Status: Active Interventions: Assess patient/caregiver ability to perform ulcer/skin care regimen upon admission and as needed Assess ulceration(s) every visit Provide education on ulcer and skin care Notes: Electronic Signature(s) Signed: 09/05/2015 5:39:18 PM By: Curtis Sites Entered By: Curtis Sites on 09/05/2015 15:16:29 [name] ([id number]) -------------------------------------------------------------------------------- Patient/Caregiver Education Details Patient Name: [name] Date of Service: 09/05/2015 2:45 PM Medical Record Number: [id number] Patient Account Number: [id number] Date of Birth/Gender: [birth year] (80 y.o. Male) Treating RN: Curtis Sites Primary Care Physician: Dorothey Baseman Other Clinician: Referring Physician: Dorothey Baseman Treating Physician/Extender: Rudene Re in Treatment: 61 Education Assessment Education Provided To: Patient Education Topics Provided Basic Hygiene: Handouts: Other: have  SNF staff wash your toes Methods:  Explain/Verbal Responses: State content correctly Electronic Signature(s) Signed: 09/05/2015 5:39:18 PM By: Curtis Sites Entered By: Curtis Sites on 09/05/2015 16:13:23 [name], [name] ([id number]) -------------------------------------------------------------------------------- Wound Assessment Details Patient Name: [name], [name] Date of Service: 09/05/2015 2:45 PM Medical Record Number: [id number] Patient Account Number: [id number] Date of Birth/Sex: 27-Sep-1935 (80 y.o. Male) Treating RN: Curtis Sites Primary Care Physician: Terance Hart, DAVID Other Clinician: Referring Physician: Terance Hart, DAVID Treating Physician/Extender: Rudene Re in Treatment: 33 Wound Status Wound Number: 5 Primary Venous Leg Ulcer Etiology: Wound Location: Right Lower Leg - Medial Wound Status: Open Wounding Event: Gradually Appeared Comorbid Cataracts, Anemia, Hypertension, Date Acquired: 11/13/2014 History: Osteoarthritis Weeks Of Treatment: 33 Clustered Wound: No Photos Photo Uploaded By: Curtis Sites on 09/05/2015 16:51:34 Wound Measurements Length: (cm) 3 Width: (cm) 0.8 Depth: (cm) 0.1 Area: (cm) 1.885 Volume: (cm) 0.188 % Reduction in Area: 98.2% % Reduction in Volume: 99.4% Epithelialization: Medium (34-66%) Tunneling: No Undermining: No Wound Description Full Thickness Without Exposed Classification: Support Structures Wound Margin: Distinct, outline attached Exudate Medium Amount: Exudate Type: Serosanguineous Exudate Color: red, brown Foul Odor After Cleansing: No Wound Bed Granulation Amount: Large (67-100%) Exposed Structure Granulation Quality: Pink, Pale Fascia Exposed: No Necrotic Amount: None Present (0%) Fat Layer Exposed: No [name], [name] ([id number]) Tendon Exposed: No Muscle Exposed: No Joint Exposed: No Bone Exposed: No Limited to Skin Breakdown Periwound Skin Texture Texture Color No Abnormalities Noted: No No Abnormalities  Noted: No Callus: No Atrophie Blanche: No Crepitus: No Cyanosis: No Excoriation: No Ecchymosis: No Fluctuance: No Erythema: No Friable: No Hemosiderin Staining: No Induration: No Mottled: No Localized Edema: No Pallor: No Rash: No Rubor: No Scarring: No Temperature / Pain Moisture Temperature: No Abnormality No Abnormalities Noted: No Dry / Scaly: No Maceration: No Moist: Yes Wound Preparation Ulcer Cleansing: Other: soap and water, Topical Anesthetic Applied: None, Other: lidocaine 4%, Treatment Notes Wound #5 (Right, Medial Lower Leg) 1. Cleansed with: Cleanse wound with antibacterial soap and water 2. Anesthetic Topical Lidocaine 4% cream to wound bed prior to debridement 4. Dressing Applied: Mepitel Other dressing (specify in notes) 5. Secondary Dressing Applied ABD Pad 7. Secured with 4-Layer Compression System - Right Lower Extremity Notes puraply applied by Dr. Meyer Russel today carboflex Electronic Signature(s) [name], [name] ([id number]) Signed: 09/05/2015 5:39:18 PM By: Curtis Sites Entered By: Curtis Sites on 09/05/2015 15:06:20 [name], [name] ([id number]) -------------------------------------------------------------------------------- Vitals Details Patient Name: [name], [name] Date of Service: 09/05/2015 2:45 PM Medical Record Number: [id number] Patient Account Number: [id number] Date of Birth/Sex: 09-10-1935 (80 y.o. Male) Treating RN: Curtis Sites Primary Care Physician: Dorothey Baseman Other Clinician: Referring Physician: Terance Hart, DAVID Treating Physician/Extender: Rudene Re in Treatment: 33 Vital Signs Time Taken: 14:58 Temperature (F): 97.8 Height (in): 68 Pulse (bpm): 65 Respiratory Rate (breaths/min): 20 Blood Pressure (mmHg): 148/79 Reference Range: 80 - 120 mg / dl Electronic Signature(s) Signed: 09/05/2015 5:39:18 PM By: Curtis Sites Entered By: Curtis Sites on 09/05/2015 14:57:23

## 2015-09-09 NOTE — Progress Notes (Signed)
[name], [name] ([id number]) Visit Report for 08/22/2015 Chief Complaint Document Details Patient Name: [name], [name] Date of Service: 08/22/2015 2:00 PM Medical Record Number: [id number] Patient Account Number: [id number] Date of Birth/Sex: July 11, 1935 (80 y.o. Male) Treating RN: Curtis Sites Primary Care Physician: Dorothey Baseman Other Clinician: Referring Physician: Dorothey Baseman Treating Physician/Extender: Elayne Snare in Treatment: 31 Information Obtained from: Patient Chief Complaint Patient presents for treatment of an open ulcer due to venous insufficiency. The patient has had a open wound on his right medial ankle for at least 3 years and was last seen in the wound clinic in February 2015. He was lost to follow-up after that. Electronic Signature(s) Signed: 08/23/2015 8:22:20 AM By: Ardath Sax MD Entered By: Ardath Sax on 08/22/2015 14:59:05 [name], [name] ([id number]) -------------------------------------------------------------------------------- Cellular or Tissue Based Product Details Patient Name: [name], [name] Date of Service: 08/22/2015 2:00 PM Medical Record Number: [id number] Patient Account Number: [id number] Date of Birth/Sex: 1935-10-17 (80 y.o. Male) Treating RN: Curtis Sites Primary Care Physician: Dorothey Baseman Other Clinician: Referring Physician: Terance Hart, DAVID Treating Physician/Extender: Elayne Snare in Treatment: 31 Cellular or Tissue Based Wound #5 Right,Medial Lower Leg Product Type Applied to: Performed By: Physician Ardath Sax, MD Cellular or Tissue Based Other Product Type: Time-Out Taken: Yes Location: genitalia / hands / feet / multiple digits Wound Size (sq cm): 4.81 Product Size (sq cm): 54 Waste Size (sq cm): 49 Waste Reason: wound size Amount of Product Applied (sq cm): 5 Lot #: [id number] Expiration Date: 05/25/2017 Fenestrated: No Reconstituted: No Secured: Yes Secured With:  Steri-Strips Dressing Applied: Yes Primary Dressing: mepitel one Procedural Pain: 0 Post Procedural Pain: 0 Response to Treatment: Procedure was tolerated well Post Procedure Diagnosis Same as Pre-procedure Electronic Signature(s) Signed: 08/22/2015 3:47:52 PM By: Curtis Sites Entered By: Curtis Sites on 08/22/2015 14:26:06 [name] ([id number]) -------------------------------------------------------------------------------- HPI Details Patient Name: [name] Date of Service: 08/22/2015 2:00 PM Medical Record Number: [id number] Patient Account Number: [id number] Date of Birth/Sex: 11/10/1935 (80 y.o. Male) Treating RN: Curtis Sites Primary Care Physician: Dorothey Baseman Other Clinician: Referring Physician: Terance Hart, DAVID Treating Physician/Extender: Elayne Snare in Treatment: 31 History of Present Illness Location: right lower extremity ulceration Quality: Patient reports experiencing a dull pain to affected area(s). Severity: Patient states wound are getting worse. 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 Modifying Factors: Consults to this date include:has received treatment in the wound center in the past but stopped coming here since February 2015 Associated Signs and Symptoms: Patient reports having difficulty standing for long periods. HPI Description: 80 year old male who is known to have progressive weakness and has been in a nursing home for a while has had chronic lower extremity edema both legs and a large open wound on the right lower extremity which is has at least for about 3-4 years. The patient was seen in the wound clinic before and has been  treated until February 2015 when he was lost to follow-up. Past medical history is significant for hypertension, gout, venous stasis ulcers, Parkinson's [name], constipation. He's not been a diabetic but his last hemoglobin A1c was 6 in March 2015. There is documentation that he's had  endovenous ablation of bilateral varicose veins but we have no documentation about this and this may be done at least 4-5 years ago. 01/22/2015 -- he has his vascular test is scheduled for this afternoon. 01/29/2015 -- the patient's vascular test was canceled last week because he was unable to get onto the examining bed at [facility]. His Unna's boot was not applied either and the patient had a dressing placed by home health. Today when his dressing was removed we found a lot of maggots in his right lower extremity. 02/07/2015 -- Was seen in the vascular office by the PA and she noted that a bilateral venous duplex study did not show any DVT, SVT or reflux bilaterally. The patient was advised to continue with Unna's boot and would at some stage to require graduated compression stockings of the 20-30 mmHg variety. In the future lymphedema pumps would also be considered. 02/14/2015 -- his dressing is smelling quite a bit and there is a discoloration of the wound suggestive of an infection. Deep tissue cultures will be taken today. 02/21/2015 -- the culture is back and it has growth moderate growth of Proteus and gram-negative rods sensitive to sulfa, ampicillin, cephazolin, Zosyn. He is already on doxycycline and his wound is looking clean and hence we will continue with this. 02/28/2015 -- he's been doing fine still on his antibiotics and has no fresh issues. 03/07/2015 -- I was told that because he lives in a nursing home the Apligraf was denied by his insurance company. 03/14/2015 -- we are awaiting samples and a trial of Puraply to be tried for his ulceration. 03/21/2015 -- his Apligraf was approved and he is  here for his first application of Apligraf 03/28/2015 -- his wound has had quite a bit of secretion and needs to be changed and hence I will use the BERNARD, SLAYDEN. ([id number]) second application of Apligraf. 04/04/2015 -- he is here for a wound check but has a lot of secretions and hence his dressing needs to be taken down. 04/09/2015 -- he is here for his third application of Apligraf 04/18/2015 -- he is here for a wound check and though he does not have any overt infection he always has a foul odor to his leg. 04/25/2015 -- his wound has been doing much better and he has minimal drainage and the size is smaller. Of note he is on doxycycline. 05/02/2015 -- he is here for his fourth application of Apligraf 05/09/2015 -- he is here for a wound check and is bothered by a lot of odor from the wound. 05/16/2015 -- he has done very well, the odor and drainage is less with the wound dressing having been changed twice this week. He is here for his last application of Apligraf. 06/20/2015 -- an area superior to the previous wound has now opened up and this is a fairly superficial ulceration. 07/12/2015 -- he is here for his first application of Puraply. This is a sample and this is no charge. 07/18/2015 -- he is here for his second application of Puraply. This is a sample and this is no charge. 07/25/2015 -- he is here for his third application of Puraply. This is a sample and this is no charge. 08/01/2015 -- he is here for his fourth application of Puraply. This is a sample and this is no charge. 08/08/2015 -- he is here for his fifth application of Puraply and this is a sample at no charge. 08/15/2015 -- he is here for his sixth application  of Puraply and this is a sample at no charge. Electronic Signature(s) Signed: 08/23/2015 8:22:20 AM By: Ardath Sax MD Entered By: Ardath Sax on 08/22/2015 14:59:24 [name], [name]  ([id number]) -------------------------------------------------------------------------------- Physical Exam Details Patient Name: [name], [name] Date of Service: 08/22/2015 2:00 PM Medical Record Number: [id number] Patient Account Number: [id number] Date of Birth/Sex: July 17, 1935 (80 y.o. Male) Treating RN: Curtis Sites Primary Care Physician: Dorothey Baseman Other Clinician: Referring Physician: Dorothey Baseman Treating Physician/Extender: Elayne Snare in Treatment: 31 Electronic Signature(s) Signed: 08/23/2015 8:22:20 AM By: Ardath Sax MD Entered By: Ardath Sax on 08/22/2015 14:59:35 [name], [name] ([id number]) -------------------------------------------------------------------------------- Physician Orders Details Patient Name: [name], [name] Date of Service: 08/22/2015 2:00 PM Medical Record Number: [id number] Patient Account Number: [id number] Date of Birth/Sex: 22-Jun-1936 (80 y.o. 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 Skin Barriers/Peri-Wound Care Wound #5 Right,Medial Lower Leg o Moisturizing lotion Primary Wound Dressing Wound #5 Right,Medial Lower Leg o Other: - PuraPly applied by Dr Jimmey Ralph today in clinic Secondary Dressing Wound #5 Right,Medial Lower Leg o ABD pad - Carboflex and ABD; gauze  to bolster o Mepitel One - over PuraPly Dressing Change Frequency Wound #5 Right,Medial Lower Leg o Change dressing every week Follow-up Appointments Wound #5 Right,Medial Lower Leg o Return Appointment in 1 week. Edema Control [name], [name] ([id number]) Wound #5 Right,Medial Lower Leg o 4-Layer Compression System - Right Lower Extremity Electronic Signature(s) Signed: 08/22/2015 3:47:52 PM By: Curtis Sites Signed: 08/23/2015 8:22:20 AM By: Ardath Sax MD Entered By: Curtis Sites on 08/22/2015 14:26:48 [name], LUELLEN ([id number]) -------------------------------------------------------------------------------- Problem List Details Patient Name: [name], [name] Date of Service: 08/22/2015 2:00 PM Medical Record Number: [id number] Patient Account Number: [id number] Date of Birth/Sex: 08/29/1935 (80 y.o. 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 Date of Service: 08/22/2015 2:00 PM Medical Record Number: [id number] Patient Account Number: [id number] Date of Birth/Sex: Jun 26, 1936 (80 y.o. Male) Treating RN:  Curtis Sites Primary Care Physician: Dorothey Baseman Other Clinician: Referring Physician: Dorothey Baseman Treating Physician/Extender: Elayne Snare in Treatment: 31 Subjective Chief Complaint Information obtained from Patient Patient presents for treatment of an open ulcer due to venous insufficiency. The patient has had a open wound on his right medial ankle for at least 3 years and was last seen in the wound clinic in February 2015. He was lost to follow-up after that. History of Present  Illness (HPI) The following HPI elements were documented for the patient's wound: Location: right lower extremity ulceration Quality: Patient reports experiencing a dull pain to affected area(s). Severity: Patient states wound are getting worse. 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 Modifying Factors: Consults to this date include:has received treatment in the wound center in the past but stopped coming here since February 2015 Associated Signs and Symptoms: Patient reports having difficulty standing for long periods. 80 year old male who is known to have progressive weakness and has been in a nursing home for a while has had chronic lower extremity edema both legs and a large open wound on the right lower extremity which is has at least for about 3-4 years. The patient was seen in the wound clinic before and has been treated until February 2015 when he was lost to follow-up. Past medical history is significant for hypertension, gout, venous stasis ulcers, Parkinson's [name], constipation. He's not been a diabetic but his last hemoglobin A1c was 6 in March 2015. There is documentation that  he's had endovenous ablation of bilateral varicose veins but we have no documentation about this and this may be done at least 4-5 years ago. 01/22/2015 -- he has his vascular test is scheduled for this afternoon. 01/29/2015 -- the patient's vascular test was canceled last week because he was unable to get onto the examining bed at [facility]. His Unna's boot was not applied either and the patient had a dressing placed by home health. Today when his dressing was removed we found a lot of maggots in his right lower extremity. 02/07/2015 -- Was seen in the vascular office by the PA and she noted that a bilateral venous duplex study did not show any DVT, SVT or reflux bilaterally. The patient was advised to continue with Unna's boot and would at some stage to require graduated compression stockings of the 20-30 mmHg variety. In the future lymphedema pumps would also be considered. 02/14/2015 -- his dressing is smelling quite a bit and there is a discoloration of the wound suggestive of an KHRIS, JANSSON. ([id number]) infection. Deep tissue cultures will be taken today. 02/21/2015 -- the culture is back and it has growth moderate growth of Proteus and gram-negative rods sensitive to sulfa, ampicillin, cephazolin, Zosyn. He is already on doxycycline and his wound is looking clean and hence we will continue with this. 02/28/2015 -- he's been doing fine still on his antibiotics and has no fresh issues. 03/07/2015 -- I was told that because he lives in a nursing home the Apligraf was denied by his insurance company. 03/14/2015 -- we are awaiting samples and a trial of Puraply to be tried for his ulceration. 03/21/2015 -- his Apligraf was approved and he is here for his first application of Apligraf 03/28/2015 -- his wound has had quite a bit of secretion and needs to be changed and hence I will use the second application of Apligraf. 04/04/2015 -- he is here for a wound check but has a lot of secretions  and hence his dressing needs to be taken down. 04/09/2015 -- he is here for his third application of Apligraf 04/18/2015 -- he is here for a  wound check and though he does not have any overt infection he always has a foul odor to his leg. 04/25/2015 -- his wound has been doing much better and he has minimal drainage and the size is smaller. Of note he is on doxycycline. 05/02/2015 -- he is here for his fourth application of Apligraf 05/09/2015 -- he is here for a wound check and is bothered by a lot of odor from the wound. 05/16/2015 -- he has done very well, the odor and drainage is less with the wound dressing having been changed twice this week. He is here for his last application of Apligraf. 06/20/2015 -- an area superior to the previous wound has now opened up and this is a fairly superficial ulceration. 07/12/2015 -- he is here for his first application of Puraply. This is a sample and this is no charge. 07/18/2015 -- he is here for his second application of Puraply. This is a sample and this is no charge. 07/25/2015 -- he is here for his third application of Puraply. This is a sample and this is no charge. 08/01/2015 -- he is here for his fourth application of Puraply. This is a sample and this is no charge. 08/08/2015 -- he is here for his fifth application of Puraply and this is a sample at no charge. 08/15/2015 -- he is here for his sixth application of Puraply and this is a sample at no charge. Objective Constitutional Vitals Time Taken: 1:53 PM, Height: 68 in, Temperature: 98.1 F, Pulse: 68 bpm, Respiratory Rate: 18 breaths/min, Blood Pressure: 140/54 mmHg. Integumentary (Hair, Skin) Wound #5 status is Open. Original cause of wound was Gradually Appeared. The wound is located on the Right,Medial Lower Leg. The wound measures 3.7cm length x 1.3cm width x 0.2cm depth; 3.778cm^2 area and 0.756cm^3 volume. The wound is limited to skin breakdown. There is no tunneling or  undermining LAMARIUS, DIRR. ([id number]) noted. There is a medium amount of serosanguineous drainage noted. The wound margin is distinct with the outline attached to the wound base. There is large (67-100%) pink, pale granulation within the wound bed. There is a small (1-33%) amount of necrotic tissue within the wound bed including Adherent Slough. The periwound skin appearance exhibited: Moist. The periwound skin appearance did not exhibit: Callus, Crepitus, Excoriation, Fluctuance, Friable, Induration, Localized Edema, Rash, Scarring, Dry/Scaly, Maceration, Atrophie Blanche, Cyanosis, Ecchymosis, Hemosiderin Staining, Mottled, Pallor, Rubor, Erythema. Periwound temperature was noted as No Abnormality. 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 Wound smaller Procedures Wound #5 Wound #5 is a Venous Leg Ulcer located on the Right,Medial Lower Leg. A skin graft procedure using a bioengineered skin substitute/cellular or tissue based product was performed by Ardath Sax, MD. Other was applied and secured with Steri-Strips. 5 sq cm of product was utilized and 49 sq cm was wasted due to wound size. Post Application, mepitel one was applied. A Time Out was conducted prior to the start of the procedure. The procedure was tolerated well with a pain level of 0 throughout and a pain level of 0 following the procedure. Post procedure Diagnosis Wound #5: Same as Pre-Procedure . cleaned up wound and applied Purafly graft. Wound smaller Plan CLAYBORNE, DIVIS ([id number]) Wound Cleansing: Wound #5 Right,Medial Lower Leg:  Cleanse wound with mild soap and water No tub bath. Skin Barriers/Peri-Wound  Care: Wound #5 Right,Medial Lower Leg: Moisturizing lotion Primary Wound Dressing: Wound #5 Right,Medial Lower Leg: Other: - PuraPly applied by Dr Jimmey Ralph today in clinic Secondary Dressing: Wound #5 Right,Medial Lower Leg: ABD pad - Carboflex and ABD; gauze to bolster Mepitel One - over PuraPly Dressing Change Frequency: Wound #5 Right,Medial Lower Leg: Change dressing every week Follow-up Appointments: Wound #5 Right,Medial Lower Leg: Return Appointment in 1 week. Edema Control: Wound #5 Right,Medial Lower Leg: 4-Layer Compression System - Right Lower Extremity Follow-Up Appointments: A follow-up appointment should be scheduled. Medication Reconciliation completed and provided to Patient/Care Provider. A Patient Clinical Summary of Care was provided to [facility] continue Purafly Electronic Signature(s) Signed: 09/09/2015 10:13:55 AM By: Ardath Sax MD Previous Signature: 08/23/2015 8:22:20 AM Version By: Ardath Sax MD Entered By: Ardath Sax on 08/22/2015 16:21:32 DANI, DANIS ([id number]) -------------------------------------------------------------------------------- SuperBill Details Patient Name: [name] Date of Service: 08/22/2015 Medical Record Number: [id number] Patient Account Number: [id number] Date of Birth/Sex: Dec 15, 1935 (80 y.o. 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

## 2015-09-12 ENCOUNTER — Encounter: Payer: Medicare Other | Admitting: Surgery

## 2015-09-12 DIAGNOSIS — I87311 Chronic venous hypertension (idiopathic) with ulcer of right lower extremity: Secondary | ICD-10-CM | POA: Diagnosis not present

## 2015-09-13 NOTE — Progress Notes (Signed)
MARTEZE, VECCHIO ([id number]) Visit Report for 09/12/2015 Chief Complaint Document Details Patient Name: [name], [name] Date of Service: 09/12/2015 2:45 PM Medical Record Number: [id number] Patient Account Number: [id number] Date of Birth/Sex: 1936-06-20 (80 y.o. Male) Treating RN: Curtis Sites Primary Care Physician: Dorothey Baseman Other Clinician: Referring Physician: Dorothey Baseman Treating Physician/Extender: Rudene Re in Treatment: 24 Information Obtained from: Patient Chief Complaint Patient presents for treatment of an open ulcer due to venous insufficiency. The patient has had a open wound on his right medial ankle for at least 3 years and was last seen in the wound clinic in February 2015. He was lost to follow-up after that. Electronic Signature([name]) Signed: 09/12/2015 3:36:02 PM By: Evlyn Kanner MD, FACS Entered By: Evlyn Kanner on 09/12/2015 15:36:02 [name], [name] ([id number]) -------------------------------------------------------------------------------- HPI Details Patient Name: [name], [name] Date of Service: 09/12/2015 2:45 PM Medical Record Number: [id number] Patient Account Number: [id number] Date of Birth/Sex: January 08, 1936 (80 y.o. Male) Treating RN: Curtis Sites Primary Care Physician: Dorothey Baseman Other Clinician: Referring Physician: Dorothey Baseman Treating Physician/Extender: Rudene Re in Treatment: 34 History of Present Illness Location: right lower extremity ulceration Quality: Patient reports experiencing a dull pain to affected area([name]). Severity: Patient states wound are getting worse. 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 Modifying Factors: Consults to this date include:has received treatment in the wound center in the past but stopped coming here since February 2015 Associated Signs and Symptoms: Patient reports having difficulty standing for long periods. HPI Description: 80 year old male who is known to have progressive weakness and has been in a nursing home for a while has had chronic lower extremity edema both legs and a large open wound on the right lower extremity which is has at least for about 3-4 years. The patient was seen in the wound clinic before and has been treated until February 2015 when he was lost to follow-up. Past medical history is significant for hypertension, gout, venous stasis ulcers, Parkinson'[name] [name], constipation. He'[name] not been a diabetic but his last hemoglobin A1c was 6 in March 2015. There is documentation that he'[name] had endovenous ablation of bilateral varicose veins but we have no documentation about this and this may be done at least 4-5 years ago. 01/22/2015 -- he has his vascular test is scheduled for this afternoon. 01/29/2015 -- the patient'[name] vascular test was canceled last week because he was unable to get onto the examining bed at [facility]. His Unna'[name] boot was not applied either and the patient had a dressing placed by home health. Today when his dressing was removed we found a lot of maggots in his right lower extremity. 02/07/2015 -- Was seen in the vascular office by the PA and she noted that a bilateral venous duplex study did not show any DVT, SVT or reflux bilaterally. The patient was advised to continue with Unna'[name] boot and would at some stage to require graduated compression stockings of the 20-30 mmHg variety. In the future lymphedema pumps would also be considered. 02/14/2015 -- his dressing is smelling quite a bit and there is a discoloration of the wound suggestive of an infection. Deep tissue cultures will be taken today. 02/21/2015 --  the culture  is back and it has growth moderate growth of Proteus and gram-negative rods sensitive to sulfa, ampicillin, cephazolin, Zosyn. He is already on doxycycline and his wound is looking clean and hence we will continue with this. 02/28/2015 -- he'[name] been doing fine still on his antibiotics and has no fresh issues. 03/07/2015 -- I was told that because he lives in a nursing home the Apligraf was denied by his insurance company. 03/14/2015 -- we are awaiting samples and a trial of Puraply to be tried for his ulceration. 03/21/2015 -- his Apligraf was approved and he is here for his first application of Apligraf 03/28/2015 -- his wound has had quite a bit of secretion and needs to be changed and hence I will use the [name], [name] ([id number]) second application of Apligraf. 04/04/2015 -- he is here for a wound check but has a lot of secretions and hence his dressing needs to be taken down. 04/09/2015 -- he is here for his third application of Apligraf 04/18/2015 -- he is here for a wound check and though he does not have any overt infection he always has a foul odor to his leg. 04/25/2015 -- his wound has been doing much better and he has minimal drainage and the size is smaller. Of note he is on doxycycline. 05/02/2015 -- he is here for his fourth application of Apligraf 05/09/2015 -- he is here for a wound check and is bothered by a lot of odor from the wound. 05/16/2015 -- he has done very well, the odor and drainage is less with the wound dressing having been changed twice this week. He is here for his last application of Apligraf. 06/20/2015 -- an area superior to the previous wound has now opened up and this is a fairly superficial ulceration. 07/12/2015 -- he is here for his first application of Puraply. This is a sample and this is no charge. 07/18/2015 -- he is here for his second application of Puraply. This is a sample and this is no charge. 07/25/2015 -- he is here for his third  application of Puraply. This is a sample and this is no charge. 08/01/2015 -- he is here for his fourth application of Puraply. This is a sample and this is no charge. 08/08/2015 -- he is here for his fifth application of Puraply and this is a sample at no charge. 08/15/2015 -- he is here for his sixth application of Puraply and this is a sample at no charge. 08/29/2015 -- he is here for his eight application of Puraply and this is a sample at no charge. 09/05/2015 -- he is here for his nineth application of Puraply and this is a sample at no charge. Electronic Signature([name]) Signed: 09/12/2015 3:36:18 PM By: Evlyn Kanner MD, FACS Entered By: Evlyn Kanner on 09/12/2015 15:36:17 [name], [name] ([id number]) -------------------------------------------------------------------------------- Physical Exam Details Patient Name: [name], [name] Date of Service: 09/12/2015 2:45 PM Medical Record Number: [id number] Patient Account Number: [id number] Date of Birth/Sex: August 22, 1935 (80 y.o. Male) Treating RN: Curtis Sites Primary Care Physician: Dorothey Baseman Other Clinician: Referring Physician: Terance Hart, DAVID Treating Physician/Extender: Rudene Re in Treatment: 34 Constitutional . Pulse regular. Respirations normal and unlabored. Afebrile. . Eyes Nonicteric. Reactive to light. Ears, Nose, Mouth, and Throat Lips, teeth, and gums WNL.Marland Kitchen Moist mucosa without lesions. Neck supple and nontender. No palpable supraclavicular or cervical adenopathy. Normal sized without goiter. Respiratory WNL. No retractions.. Breath sounds WNL, No rubs, rales, rhonchi, or wheeze.. Cardiovascular Heart rhythm  and rate regular, no murmur or gallop.. Pedal Pulses WNL. No clubbing, cyanosis or edema. Lymphatic No adneopathy. No adenopathy. No adenopathy. Musculoskeletal Adexa without tenderness or enlargement.. Digits and nails w/o clubbing, cyanosis, infection, petechiae, ischemia, or inflammatory  conditions.. Integumentary (Hair, Skin) No suspicious lesions. No crepitus or fluctuance. No peri-wound warmth or erythema. No masses.Marland Kitchen Psychiatric Judgement and insight Intact.. No evidence of depression, anxiety, or agitation.. Notes the wound is small and clean and after washing out nicely with saline and we've done a dressing with the remaining collagen from his previous applications. Electronic Signature([name]) Signed: 09/12/2015 3:36:46 PM By: Evlyn Kanner MD, FACS Entered By: Evlyn Kanner on 09/12/2015 15:36:46 [name], [name] ([id number]) -------------------------------------------------------------------------------- Physician Orders Details Patient Name: [name], [name] Date of Service: 09/12/2015 2:45 PM Medical Record Number: [id number] Patient Account Number: [id number] Date of Birth/Sex: 25-Nov-1935 (80 y.o. Male) Treating RN: Curtis Sites Primary Care Physician: Dorothey Baseman Other Clinician: Referring Physician: Dorothey Baseman Treating Physician/Extender: Rudene Re in Treatment: 48 Verbal / Phone Orders: Yes Clinician: Curtis Sites Read Back and Verified: Yes Diagnosis Coding Wound Cleansing Wound #5 Right,Medial Lower Leg o Cleanse wound with mild soap and water o No tub bath. Skin Barriers/Peri-Wound Care Wound #5 Right,Medial Lower Leg o Moisturizing lotion Primary Wound Dressing Wound #5 Right,Medial Lower Leg o Other: - collagen dressing applied by Dr Meyer Russel today in clinic Secondary Dressing Wound #5 Right,Medial Lower Leg o ABD pad - Carboflex and ABD; gauze to bolster o Mepitel One - over PuraPly Dressing Change Frequency Wound #5 Right,Medial Lower Leg o Change dressing every week Follow-up Appointments Wound #5 Right,Medial Lower Leg o Return Appointment in 1 week. Edema Control Wound #5 Right,Medial Lower Leg o 4-Layer Compression System - Right Lower Extremity Electronic Signature([name]) Signed: 09/12/2015  4:35:45 PM By: Evlyn Kanner MD, FACS Signed: 09/12/2015 5:35:02 PM By: Ebbie Ridge ([id number]) Entered By: Curtis Sites on 09/12/2015 15:18:16 [name], [name] ([id number]) -------------------------------------------------------------------------------- Problem List Details Patient Name: [name], [name] Date of Service: 09/12/2015 2:45 PM Medical Record Number: [id number] Patient Account Number: [id number] Date of Birth/Sex: 09-01-1935 (80 y.o. 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 Date of Service: 09/12/2015 2:45 PM Medical Record Number: [id number] Patient Account Number: [id number] Date of Birth/Sex: [birth year] (80 y.o. Male) Treating RN: Curtis Sites Primary Care Physician: Dorothey Baseman Other Clinician: Referring Physician: Dorothey Baseman Treating Physician/Extender: Rudene Re in Treatment: 43 Subjective Chief Complaint Information obtained from Patient Patient presents for treatment of an open ulcer due to venous insufficiency. The patient has had a open wound on his  right medial ankle for at least 3 years and was last seen in the wound clinic in February 2015. He was lost to follow-up after that. History of Present Illness (HPI) The following HPI elements were documented for the patient'[name] wound: Location: right lower extremity ulceration Quality: Patient reports experiencing a dull  pain to affected area([name]). Severity: Patient states wound are getting worse. 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 Modifying Factors: Consults to this date include:has received treatment in the wound center in the past but stopped coming here since February 2015 Associated Signs and Symptoms: Patient reports having difficulty standing for long periods. 80 year old male who is known to have progressive weakness and has been in a nursing home for a while has had chronic lower extremity edema both legs and a large open wound on the right lower extremity which is has at least for about 3-4 years. The patient was seen in the wound clinic before and has been treated until February 2015 when he was lost to follow-up. Past medical history is significant for hypertension, gout, venous stasis ulcers, Parkinson'[name] [name], constipation. He'[name] not been a diabetic but his last hemoglobin A1c was 6 in March 2015. There is documentation that he'[name] had endovenous ablation of bilateral varicose veins but we have no documentation about this and this may be done at least 4-5 years ago. 01/22/2015 -- he has his vascular test is scheduled for this afternoon. 01/29/2015 -- the patient'[name] vascular test was canceled last week because he was unable to get onto the examining bed at [facility]. His Unna'[name] boot was not  applied either and the patient had a dressing placed by home health. Today when his dressing was removed we found a lot of maggots in his right lower extremity. 02/07/2015 -- Was seen in the vascular office by the PA and she noted that a bilateral venous duplex study did not show any DVT, SVT or reflux bilaterally. The patient was advised to continue with Unna'[name] boot and would at some stage to require graduated compression stockings of the 20-30 mmHg variety. In the future lymphedema pumps would also be considered. 02/14/2015 -- his dressing is smelling quite a bit and there is a discoloration of the wound suggestive of an [name], [name] ([id number]) infection. Deep tissue cultures will be taken today. 02/21/2015 -- the culture is back and it has growth moderate growth of Proteus and gram-negative rods sensitive to sulfa, ampicillin, cephazolin, Zosyn. He is already on doxycycline and his wound is looking clean and hence we will continue with this. 02/28/2015 -- he'[name] been doing fine still on his antibiotics and has no fresh issues. 03/07/2015 -- I was told that because he lives in a nursing home the Apligraf was denied by his insurance company. 03/14/2015 -- we are awaiting samples and a trial of Puraply to be tried for his ulceration. 03/21/2015 -- his Apligraf was approved and he is here for his first application of Apligraf 03/28/2015 -- his wound has had quite a bit of secretion and needs to be changed and hence I will use the second application of Apligraf. 04/04/2015 -- he is here for a wound check but has a lot of secretions and hence his dressing needs to be taken down. 04/09/2015 -- he is here for his third application of Apligraf 04/18/2015 -- he is here for a wound check and though he does not have any overt infection he always has a foul odor to his leg. 04/25/2015 -- his wound  has been doing much better and he has minimal drainage and the size is smaller. Of note he is on  doxycycline. 05/02/2015 -- he is here for his fourth application of Apligraf 05/09/2015 -- he is here for a wound check and is bothered by a lot of odor from the wound. 05/16/2015 -- he has done very well, the odor and drainage is less with the wound dressing having been changed twice this week. He is here for his last application of Apligraf. 06/20/2015 -- an area superior to the previous wound has now opened up and this is a fairly superficial ulceration. 07/12/2015 -- he is here for his first application of Puraply. This is a sample and this is no charge. 07/18/2015 -- he is here for his second application of Puraply. This is a sample and this is no charge. 07/25/2015 -- he is here for his third application of Puraply. This is a sample and this is no charge. 08/01/2015 -- he is here for his fourth application of Puraply. This is a sample and this is no charge. 08/08/2015 -- he is here for his fifth application of Puraply and this is a sample at no charge. 08/15/2015 -- he is here for his sixth application of Puraply and this is a sample at no charge. 08/29/2015 -- he is here for his eight application of Puraply and this is a sample at no charge. 09/05/2015 -- he is here for his nineth application of Puraply and this is a sample at no charge. Objective Constitutional Pulse regular. Respirations normal and unlabored. Afebrile. Vitals Time Taken: 2:43 PM, Height: 68 in, Pulse: 98 bpm, Respiratory Rate: 18 breaths/min, Blood Pressure: 148/74 mmHg. [name], [name] ([id number]) Eyes Nonicteric. Reactive to light. Ears, Nose, Mouth, and Throat Lips, teeth, and gums WNL.Marland Kitchen Moist mucosa without lesions. Neck supple and nontender. No palpable supraclavicular or cervical adenopathy. Normal sized without goiter. Respiratory WNL. No retractions.. Breath sounds WNL, No rubs, rales, rhonchi, or wheeze.. Cardiovascular Heart rhythm and rate regular, no murmur or gallop.. Pedal Pulses WNL. No  clubbing, cyanosis or edema. Lymphatic No adneopathy. No adenopathy. No adenopathy. Musculoskeletal Adexa without tenderness or enlargement.. Digits and nails w/o clubbing, cyanosis, infection, petechiae, ischemia, or inflammatory conditions.Marland Kitchen Psychiatric Judgement and insight Intact.. No evidence of depression, anxiety, or agitation.. General Notes: the wound is small and clean and after washing out nicely with saline and we've done a dressing with the remaining collagen from his previous applications. Integumentary (Hair, Skin) No suspicious lesions. No crepitus or fluctuance. No peri-wound warmth or erythema. No masses.. Wound #5 status is Open. Original cause of wound was Gradually Appeared. The wound is located on the Right,Medial Lower Leg. The wound measures 2.9cm length x 0.7cm width x 0.1cm depth; 1.594cm^2 area and 0.159cm^3 volume. The wound is limited to skin breakdown. There is no tunneling or undermining noted. There is a medium amount of serosanguineous drainage noted. The wound margin is distinct with the outline attached to the wound base. There is large (67-100%) pink, pale granulation within the wound bed. There is no necrotic tissue within the wound bed. The periwound skin appearance exhibited: Moist. The periwound skin appearance did not exhibit: Callus, Crepitus, Excoriation, Fluctuance, Friable, Induration, Localized Edema, Rash, Scarring, Dry/Scaly, Maceration, Atrophie Blanche, Cyanosis, Ecchymosis, Hemosiderin Staining, Mottled, Pallor, Rubor, Erythema. Periwound temperature was noted as No Abnormality. 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 Skin Barriers/Peri-Wound Care: Wound #5 Right,Medial Lower Leg: Moisturizing lotion Primary Wound Dressing: Wound #5 Right,Medial Lower Leg: Other: - collagen dressing applied by Dr Meyer Russel today in clinic Secondary Dressing: Wound #5 Right,Medial Lower Leg: ABD pad - Carboflex and ABD; gauze to bolster Mepitel One - over PuraPly Dressing Change Frequency: Wound #5 Right,Medial Lower Leg: Change dressing every week Follow-up Appointments: Wound #5 Right,Medial Lower Leg: Return Appointment in 1 week. Edema Control: Wound #5 Right,Medial Lower Leg: 4-Layer Compression System - Right Lower Extremity He has had his collagen dressing applied and we will then bolstered in place and apply a 4-layer Profore. He will be back to see [place] next week. Electronic Signature([name]) Signed: 09/12/2015 3:37:31 PM By: Evlyn Kanner MD, FACS MOHAN, ERVEN ([id number]) Entered By: Evlyn Kanner on 09/12/2015 15:37:31 CAMDEN, KNOTEK ([id number]) -------------------------------------------------------------------------------- SuperBill Details Patient Name: KAMRON, VANWYHE. Date of Service: 09/12/2015 Medical Record Number: [id number] Patient Account Number: [id number] Date of Birth/Sex: April 06, 1936 (80 y.o. 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 E66.01 Morbid (severe) obesity due to excess calories I89.0 Lymphedema, not elsewhere classified Facility Procedures CPT4: Description Modifier Quantity Code [id number] (Facility Use Only) [phone] - APPLY MULTLAY COMPRS LWR  RT 1 LEG Physician Procedures CPT4 Code Description: [id number] - WC PHYS LEVEL 3 - EST PT ICD-10 Description Diagnosis I87.311 Chronic venous hypertension (idiopathic) with ulcer of 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 I89.0 Lymphedema, not elsewhere  classified Modifier: right lower [name] of muscle Quantity: 1 extremity Electronic Signature([name]) Signed: 09/12/2015 4:16:42 PM By: Curtis Sites Signed: 09/12/2015 4:35:45 PM By: Evlyn Kanner MD, FACS Previous Signature: 09/12/2015 3:37:47 PM Version By: Evlyn Kanner MD, FACS Entered By: Curtis Sites on 09/12/2015 16:16:42

## 2015-09-13 NOTE — Progress Notes (Signed)
RONIT, MARCZAK ([id number]) Visit Report for 09/12/2015 Arrival Information Details Patient Name: [name], FEAGANS. Date of Service: 09/12/2015 2:45 PM Medical Record Number: [id number] Patient Account Number: [id number] Date of Birth/Sex: 1935/11/10 (80 y.o. Male) Treating RN: Curtis Sites Primary Care Physician: Dorothey Baseman Other Clinician: Referring Physician: Terance Hart, DAVID Treating Physician/Extender: Rudene Re in Treatment: 34 Visit Information History Since Last Visit Added or deleted any medications: No Patient Arrived: Wheel Chair Any new allergies or adverse reactions: No Arrival Time: 14:42 Had a fall or experienced change in No activities of daily living that may affect Accompanied By: self risk of falls: Transfer Assistance: None Signs or symptoms of abuse/neglect since last No Patient Identification Verified: Yes visito Secondary Verification Process Yes Hospitalized since last visit: No Completed: Pain Present Now: No Patient Requires Transmission-Based No Precautions: Patient Has Alerts: No Electronic Signature(s) Signed: 09/12/2015 5:35:02 PM By: Curtis Sites Entered By: Curtis Sites on 09/12/2015 14:42:34 [name] ([id number]) -------------------------------------------------------------------------------- Encounter Discharge Information Details Patient Name: [name] Date of Service: 09/12/2015 2:45 PM Medical Record Number: [id number] Patient Account Number: [id number] Date of Birth/Sex: 1936/03/22 (80 y.o. Male) Treating RN: Curtis Sites Primary Care Physician: Dorothey Baseman Other Clinician: Referring Physician: Dorothey Baseman Treating Physician/Extender: Rudene Re in Treatment: 29 Encounter Discharge Information Items Discharge Pain Level: 0 Discharge Condition: Stable Ambulatory Status: Wheelchair Discharge Destination: Nursing Home Transportation: Private Auto Accompanied By:  self Schedule Follow-up Appointment: Yes Medication Reconciliation completed No and provided to Patient/Care Tremane Spurgeon: Provided on Clinical Summary of Care: 09/12/2015 Form Type Recipient Paper Patient [facility] Electronic Signature(s) Signed: 09/12/2015 4:25:15 PM By: Curtis Sites Previous Signature: 09/12/2015 3:32:35 PM Version By: Francie Massing Entered By: Curtis Sites on 09/12/2015 16:25:15 [name] ([id number]) -------------------------------------------------------------------------------- Lower Extremity Assessment Details Patient Name: [name] Date of Service: 09/12/2015 2:45 PM Medical Record Number: [id number] Patient Account Number: [id number] Date of Birth/Sex: 1936-06-13 (80 y.o. Male) Treating RN: Curtis Sites Primary Care Physician: Dorothey Baseman Other Clinician: Referring Physician: Dorothey Baseman Treating Physician/Extender: Rudene Re in Treatment: 34 Edema Assessment Assessed: [Left: No] [Right: No] Edema: [Left: N] [Right: o] Vascular Assessment Pulses: Posterior Tibial Dorsalis Pedis Palpable: [Right:Yes] Extremity colors, hair growth, and conditions: Extremity Color: [Right:Hyperpigmented] Hair Growth on Extremity: [Right:No] Temperature of Extremity: [Right:Warm] Capillary Refill: [Right:< 3 seconds] Electronic Signature(s) Signed: 09/12/2015 5:35:02 PM By: Curtis Sites Entered By: Curtis Sites on 09/12/2015 14:58:42 [name] ([id number]) -------------------------------------------------------------------------------- Multi Wound Chart Details Patient Name: [name] Date of Service: 09/12/2015 2:45 PM Medical Record Number: [id number] Patient Account Number: [id number] Date of Birth/Sex: 1935-09-05 (80 y.o. Male) Treating RN: Curtis Sites Primary Care Physician: Dorothey Baseman Other Clinician: Referring Physician: Terance Hart, DAVID Treating Physician/Extender: Rudene Re in Treatment:  34 Vital Signs Height(in): 68 Pulse(bpm): 98 Weight(lbs): Blood Pressure 148/74 (mmHg): Body Mass Index(BMI): Temperature(F): Respiratory Rate 18 (breaths/min): Photos: [5:No Photos] [N/A:N/A] Wound Location: [5:Right Lower Leg - Medial] [N/A:N/A] Wounding Event: [5:Gradually Appeared] [N/A:N/A] Primary Etiology: [5:Venous Leg Ulcer] [N/A:N/A] Comorbid History: [5:Cataracts, Anemia, Hypertension, Osteoarthritis] [N/A:N/A] Date Acquired: [5:11/13/2014] [N/A:N/A] Weeks of Treatment: [5:34] [N/A:N/A] Wound Status: [5:Open] [N/A:N/A] Measurements L x W x D 2.9x0.7x0.1 [N/A:N/A] (cm) Area (cm) : [5:1.594] [N/A:N/A] Volume (cm) : [5:0.159] [N/A:N/A] % Reduction in Area: [5:98.40%] [N/A:N/A] % Reduction in Volume: 99.50% [N/A:N/A] Classification: [5:Full Thickness Without Exposed Support Structures] [N/A:N/A] Exudate Amount: [5:Medium] [N/A:N/A] Exudate Type: [5:Serosanguineous] [N/A:N/A] Exudate Color: [5:red, brown] [N/A:N/A] Wound Margin: [5:Distinct, outline attached] [N/A:N/A] Granulation Amount: [5:Large (67-100%)] [N/A:N/A] Granulation Quality: [  5:Pink, Pale] [N/A:N/A] Necrotic Amount: [5:None Present (0%)] [N/A:N/A] Exposed Structures: [5:Fascia: No Fat: No Tendon: No Muscle: No] [N/A:N/A] Joint: No Bone: No Limited to Skin Breakdown Epithelialization: Medium (34-66%) N/A N/A Periwound Skin Texture: Edema: No N/A N/A Excoriation: No Induration: No Callus: No Crepitus: No Fluctuance: No Friable: No Rash: No Scarring: No Periwound Skin Moist: Yes N/A N/A Moisture: Maceration: No Dry/Scaly: No Periwound Skin Color: Atrophie Blanche: No N/A N/A Cyanosis: No Ecchymosis: No Erythema: No Hemosiderin Staining: No Mottled: No Pallor: No Rubor: No Temperature: No Abnormality N/A N/A Tenderness on No N/A N/A Palpation: Wound Preparation: Ulcer Cleansing: Other: N/A N/A soap and water Topical Anesthetic Applied: None Treatment Notes Electronic  Signature(s) Signed: 09/12/2015 4:15:50 PM By: Curtis Sites Entered By: Curtis Sites on 09/12/2015 16:15:50 [name] ([id number]) -------------------------------------------------------------------------------- Multi-Disciplinary Care Plan Details Patient Name: [name], DECOCK. Date of Service: 09/12/2015 2:45 PM Medical Record Number: [id number] Patient Account Number: [id number] Date of Birth/Sex: 11/22/1935 (80 y.o. Male) Treating RN: Curtis Sites Primary Care Physician: Dorothey Baseman Other Clinician: Referring Physician: Dorothey Baseman Treating Physician/Extender: Rudene Re in Treatment: 80 Active Inactive Orientation to the Wound Care Program Nursing Diagnoses: Knowledge deficit related to the wound healing center program Goals: Patient/caregiver will verbalize understanding of the Wound Healing Center Program Date Initiated: 01/15/2015 Goal Status: Active Interventions: Provide education on orientation to the wound center Notes: Venous Leg Ulcer Nursing Diagnoses: Knowledge deficit related to disease process and management Potential for venous Insuffiency (use before diagnosis confirmed) Goals: Patient will maintain optimal edema control Date Initiated: 01/15/2015 Goal Status: Active Patient/caregiver will verbalize understanding of disease process and disease management Date Initiated: 01/15/2015 Goal Status: Active Verify adequate tissue perfusion prior to therapeutic compression application Date Initiated: 01/15/2015 Goal Status: Active Interventions: Assess peripheral edema status every visit. Compression as ordered Provide education on venous insufficiency [name], [name] ([id number]) Treatment Activities: Test ordered outside of clinic : 09/12/2015 Therapeutic compression applied : 09/12/2015 Venous Duplex Doppler : 09/12/2015 Notes: Wound/Skin Impairment Nursing Diagnoses: Impaired tissue integrity Knowledge deficit related to  ulceration/compromised skin integrity Goals: Patient/caregiver will verbalize understanding of skin care regimen Date Initiated: 01/15/2015 Goal Status: Active Ulcer/skin breakdown will have a volume reduction of 30% by week 4 Date Initiated: 01/15/2015 Goal Status: Active Ulcer/skin breakdown will have a volume reduction of 50% by week 8 Date Initiated: 01/15/2015 Goal Status: Active Ulcer/skin breakdown will have a volume reduction of 80% by week 12 Date Initiated: 01/15/2015 Goal Status: Active Ulcer/skin breakdown will heal within 14 weeks Date Initiated: 01/15/2015 Goal Status: Active Interventions: Assess patient/caregiver ability to perform ulcer/skin care regimen upon admission and as needed Assess ulceration(s) every visit Provide education on ulcer and skin care Notes: Electronic Signature(s) Signed: 09/12/2015 4:15:43 PM By: Curtis Sites Entered By: Curtis Sites on 09/12/2015 16:15:43 [name] ([id number]) -------------------------------------------------------------------------------- Patient/Caregiver Education Details Patient Name: [name] Date of Service: 09/12/2015 2:45 PM Medical Record Number: [id number] Patient Account Number: [id number] Date of Birth/Gender: 16-Apr-1936 (80 y.o. Male) Treating RN: Curtis Sites Primary Care Physician: Dorothey Baseman Other Clinician: Referring Physician: Dorothey Baseman Treating Physician/Extender: Rudene Re in Treatment: 66 Education Assessment Education Provided To: Patient Education Topics Provided Basic Hygiene: Handouts: Other: have aids wash toes during the week Methods: Explain/Verbal Responses: State content correctly Electronic Signature(s) Signed: 09/12/2015 4:25:55 PM By: Curtis Sites Entered By: Curtis Sites on 09/12/2015 16:25:55 [name] ([id number]) -------------------------------------------------------------------------------- Wound Assessment Details Patient  Name: [name] Date of Service: 09/12/2015 2:45 PM  Medical Record Number: [id number] Patient Account Number: [id number] Date of Birth/Sex: December 13, 1935 (80 y.o. Male) Treating RN: Curtis Sites Primary Care Physician: Terance Hart, DAVID Other Clinician: Referring Physician: Terance Hart, DAVID Treating Physician/Extender: Rudene Re in Treatment: 34 Wound Status Wound Number: 5 Primary Venous Leg Ulcer Etiology: Wound Location: Right Lower Leg - Medial Wound Status: Open Wounding Event: Gradually Appeared Comorbid Cataracts, Anemia, Hypertension, Date Acquired: 11/13/2014 History: Osteoarthritis Weeks Of Treatment: 34 Clustered Wound: No Photos Photo Uploaded By: Curtis Sites on 09/12/2015 17:38:47 Wound Measurements Length: (cm) 2.9 Width: (cm) 0.7 Depth: (cm) 0.1 Area: (cm) 1.594 Volume: (cm) 0.159 % Reduction in Area: 98.4% % Reduction in Volume: 99.5% Epithelialization: Medium (34-66%) Tunneling: No Undermining: No Wound Description Full Thickness Without Exposed Classification: Support Structures Wound Margin: Distinct, outline attached Exudate Medium Amount: Exudate Type: Serosanguineous Exudate Color: red, brown Foul Odor After Cleansing: No Wound Bed Granulation Amount: Large (67-100%) Exposed Structure Granulation Quality: Pink, Pale Fascia Exposed: No Necrotic Amount: None Present (0%) Fat Layer Exposed: No [name], [name] ([id number]) Tendon Exposed: No Muscle Exposed: No Joint Exposed: No Bone Exposed: No Limited to Skin Breakdown Periwound Skin Texture Texture Color No Abnormalities Noted: No No Abnormalities Noted: No Callus: No Atrophie Blanche: No Crepitus: No Cyanosis: No Excoriation: No Ecchymosis: No Fluctuance: No Erythema: No Friable: No Hemosiderin Staining: No Induration: No Mottled: No Localized Edema: No Pallor: No Rash: No Rubor: No Scarring: No Temperature / Pain Moisture Temperature: No  Abnormality No Abnormalities Noted: No Dry / Scaly: No Maceration: No Moist: Yes Wound Preparation Ulcer Cleansing: Other: soap and water, Topical Anesthetic Applied: None Treatment Notes Wound #5 (Right, Medial Lower Leg) 1. Cleansed with: Cleanse wound with antibacterial soap and water 2. Anesthetic Topical Lidocaine 4% cream to wound bed prior to debridement 3. Peri-wound Care: Skin Prep 4. Dressing Applied: Mepitel Other dressing (specify in notes) 5. Secondary Dressing Applied ABD Pad 7. Secured with 4-Layer Compression System - Right Lower Extremity Notes collagen dressing applied by Dr. Meyer Russel today carboflex [name], [name] ([id number]) Electronic Signature(s) Signed: 09/12/2015 5:35:02 PM By: Curtis Sites Entered By: Curtis Sites on 09/12/2015 14:58:27 [name], [name] ([id number]) -------------------------------------------------------------------------------- Vitals Details Patient Name: [name], [name] Date of Service: 09/12/2015 2:45 PM Medical Record Number: [id number] Patient Account Number: [id number] Date of Birth/Sex: [birth year] (80 y.o. Male) Treating RN: Curtis Sites Primary Care Physician: Dorothey Baseman Other Clinician: Referring Physician: Terance Hart, DAVID Treating Physician/Extender: Rudene Re in Treatment: 34 Vital Signs Time Taken: 14:43 Pulse (bpm): 98 Height (in): 68 Respiratory Rate (breaths/min): 18 Blood Pressure (mmHg): 148/74 Reference Range: 80 - 120 mg / dl Electronic Signature(s) Signed: 09/12/2015 5:35:02 PM By: Curtis Sites Entered By: Curtis Sites on 09/12/2015 14:43:43

## 2015-09-19 ENCOUNTER — Encounter: Payer: Medicare Other | Admitting: Surgery

## 2015-09-19 DIAGNOSIS — I87311 Chronic venous hypertension (idiopathic) with ulcer of right lower extremity: Secondary | ICD-10-CM | POA: Diagnosis not present

## 2015-09-20 NOTE — Progress Notes (Signed)
SIMCHA, SPEIR ([id number]) Visit Report for 09/19/2015 Arrival Information Details Patient Name: [name], [name] Date of Service: 09/19/2015 10:45 AM Medical Record Number: [id number] Patient Account Number: [id number] Date of Birth/Sex: 18-Apr-1936 (80 y.o. Male) Treating RN: Curtis Sites Primary Care Physician: Dorothey Baseman Other Clinician: Referring Physician: Terance Hart, DAVID Treating Physician/Extender: Rudene Re in Treatment: 35 Visit Information History Since Last Visit Added or deleted any medications: No Patient Arrived: Wheel Chair Any new allergies or adverse reactions: No Arrival Time: 11:01 Had a fall or experienced change in No activities of daily living that may affect Accompanied By: self risk of falls: Transfer Assistance: None Signs or symptoms of abuse/neglect since last No Patient Identification Verified: Yes visito Secondary Verification Process Yes Hospitalized since last visit: No Completed: Pain Present Now: No Patient Requires Transmission-Based No Precautions: Patient Has Alerts: No Electronic Signature(s) Signed: 09/19/2015 6:02:03 PM By: Curtis Sites Entered By: Curtis Sites on 09/19/2015 11:02:10 [name] ([id number]) -------------------------------------------------------------------------------- Encounter Discharge Information Details Patient Name: [name] Date of Service: 09/19/2015 10:45 AM Medical Record Number: [id number] Patient Account Number: [id number] Date of Birth/Sex: 12/18/1935 (80 y.o. Male) Treating RN: Curtis Sites Primary Care Physician: Dorothey Baseman Other Clinician: Referring Physician: Dorothey Baseman Treating Physician/Extender: Rudene Re in Treatment: 46 Encounter Discharge Information Items Discharge Pain Level: 0 Discharge Condition: Stable Ambulatory Status: Wheelchair Discharge Destination: Nursing Home Transportation: Other Accompanied By: self Schedule  Follow-up Appointment: Yes Medication Reconciliation completed and provided to Patient/Care Yes Muzammil Bruins: Provided on Clinical Summary of Care: 09/19/2015 Form Type Recipient Paper Patient Physician [facility] Electronic Signature(s) Signed: 09/19/2015 11:49:51 AM By: Francie Massing Entered By: Francie Massing on 09/19/2015 11:49:51 [name] ([id number]) -------------------------------------------------------------------------------- Lower Extremity Assessment Details Patient Name: [name] Date of Service: 09/19/2015 10:45 AM Medical Record Number: [id number] Patient Account Number: [id number] Date of Birth/Sex: 06-Mar-1936 (80 y.o. Male) Treating RN: Curtis Sites Primary Care Physician: Terance Hart, DAVID Other Clinician: Referring Physician: Terance Hart, DAVID Treating Physician/Extender: Rudene Re in Treatment: 35 Edema Assessment Assessed: [Left: No] Franne Forts: No] Edema: [Left: Ye] [Right: s] Calf Left: Right: Point of Measurement: 40 cm From Medial Instep cm 38 cm Ankle Left: Right: Point of Measurement: 12 cm From Medial Instep cm 27.5 cm Vascular Assessment Pulses: Posterior Tibial Dorsalis Pedis Palpable: [Right:Yes] Extremity colors, hair growth, and conditions: Extremity Color: [Right:Hyperpigmented] Hair Growth on Extremity: [Right:No] Temperature of Extremity: [Right:Warm] Capillary Refill: [Right:< 3 seconds] Electronic Signature(s) Signed: 09/19/2015 6:02:03 PM By: Curtis Sites Entered By: Curtis Sites on 09/19/2015 11:13:10 [name] ([id number]) -------------------------------------------------------------------------------- Multi Wound Chart Details Patient Name: [name] Date of Service: 09/19/2015 10:45 AM Medical Record Number: [id number] Patient Account Number: [id number] Date of Birth/Sex: 1936-06-27 (80 y.o. Male) Treating RN: Curtis Sites Primary Care Physician: Dorothey Baseman Other Clinician: Referring Physician:  Terance Hart, DAVID Treating Physician/Extender: Rudene Re in Treatment: 35 Vital Signs Height(in): 68 Pulse(bpm): 67 Weight(lbs): Blood Pressure 144/63 (mmHg): Body Mass Index(BMI): Temperature(F): 98.4 Respiratory Rate 20 (breaths/min): Photos: [5:No Photos] [N/A:N/A] Wound Location: [5:Right, Medial Lower Leg] [N/A:N/A] Wounding Event: [5:Gradually Appeared] [N/A:N/A] Primary Etiology: [5:Venous Leg Ulcer] [N/A:N/A] Comorbid History: [5:Cataracts, Anemia, Hypertension, Osteoarthritis] [N/A:N/A] Date Acquired: [5:11/13/2014] [N/A:N/A] Weeks of Treatment: [5:35] [N/A:N/A] Wound Status: [5:Open] [N/A:N/A] Measurements L x W x D 2.8x0.7x0.1 [N/A:N/A] (cm) Area (cm) : [5:1.539] [N/A:N/A] Volume (cm) : [5:0.154] [N/A:N/A] % Reduction in Area: [5:98.50%] [N/A:N/A] % Reduction in Volume: 99.50% [N/A:N/A] Classification: [5:Full Thickness Without Exposed Support Structures] [N/A:N/A] Exudate Amount: [5:Medium] [N/A:N/A] Exudate Type: [5:Serosanguineous] [N/A:N/A]  Exudate Color: [5:red, brown] [N/A:N/A] Wound Margin: [5:Distinct, outline attached] [N/A:N/A] Granulation Amount: [5:Large (67-100%)] [N/A:N/A] Granulation Quality: [5:Pink, Pale] [N/A:N/A] Necrotic Amount: [5:None Present (0%)] [N/A:N/A] Exposed Structures: [5:Fascia: No Fat: No Tendon: No Muscle: No] [N/A:N/A] Joint: No Bone: No Limited to Skin Breakdown Epithelialization: Medium (34-66%) N/A N/A Periwound Skin Texture: Edema: No N/A N/A Excoriation: No Induration: No Callus: No Crepitus: No Fluctuance: No Friable: No Rash: No Scarring: No Periwound Skin Moist: Yes N/A N/A Moisture: Maceration: No Dry/Scaly: No Periwound Skin Color: Atrophie Blanche: No N/A N/A Cyanosis: No Ecchymosis: No Erythema: No Hemosiderin Staining: No Mottled: No Pallor: No Rubor: No Temperature: No Abnormality N/A N/A Tenderness on No N/A N/A Palpation: Wound Preparation: Ulcer Cleansing: Other: N/A  N/A soap and water Topical Anesthetic Applied: None Treatment Notes Electronic Signature(s) Signed: 09/19/2015 6:02:03 PM By: Curtis Sites Entered By: Curtis Sites on 09/19/2015 11:19:47 [name] ([id number]) -------------------------------------------------------------------------------- Multi-Disciplinary Care Plan Details Patient Name: [name], [name] Date of Service: 09/19/2015 10:45 AM Medical Record Number: [id number] Patient Account Number: [id number] Date of Birth/Sex: Dec 21, 1935 (80 y.o. Male) Treating RN: Curtis Sites Primary Care Physician: Dorothey Baseman Other Clinician: Referring Physician: Dorothey Baseman Treating Physician/Extender: Rudene Re in Treatment: 58 Active Inactive Orientation to the Wound Care Program Nursing Diagnoses: Knowledge deficit related to the wound healing center program Goals: Patient/caregiver will verbalize understanding of the Wound Healing Center Program Date Initiated: 01/15/2015 Goal Status: Active Interventions: Provide education on orientation to the wound center Notes: Venous Leg Ulcer Nursing Diagnoses: Knowledge deficit related to disease process and management Potential for venous Insuffiency (use before diagnosis confirmed) Goals: Patient will maintain optimal edema control Date Initiated: 01/15/2015 Goal Status: Active Patient/caregiver will verbalize understanding of disease process and disease management Date Initiated: 01/15/2015 Goal Status: Active Verify adequate tissue perfusion prior to therapeutic compression application Date Initiated: 01/15/2015 Goal Status: Active Interventions: Assess peripheral edema status every visit. Compression as ordered Provide education on venous insufficiency [name], [name] ([id number]) Treatment Activities: Test ordered outside of clinic : 09/19/2015 Therapeutic compression applied : 09/19/2015 Venous Duplex Doppler : 09/19/2015 Notes: Wound/Skin  Impairment Nursing Diagnoses: Impaired tissue integrity Knowledge deficit related to ulceration/compromised skin integrity Goals: Patient/caregiver will verbalize understanding of skin care regimen Date Initiated: 01/15/2015 Goal Status: Active Ulcer/skin breakdown will have a volume reduction of 30% by week 4 Date Initiated: 01/15/2015 Goal Status: Active Ulcer/skin breakdown will have a volume reduction of 50% by week 8 Date Initiated: 01/15/2015 Goal Status: Active Ulcer/skin breakdown will have a volume reduction of 80% by week 12 Date Initiated: 01/15/2015 Goal Status: Active Ulcer/skin breakdown will heal within 14 weeks Date Initiated: 01/15/2015 Goal Status: Active Interventions: Assess patient/caregiver ability to perform ulcer/skin care regimen upon admission and as needed Assess ulceration(s) every visit Provide education on ulcer and skin care Notes: Electronic Signature(s) Signed: 09/19/2015 6:02:03 PM By: Curtis Sites Entered By: Curtis Sites on 09/19/2015 11:19:38 [name] ([id number]) -------------------------------------------------------------------------------- Patient/Caregiver Education Details Patient Name: [name] Date of Service: 09/19/2015 10:45 AM Medical Record Number: [id number] Patient Account Number: [id number] Date of Birth/Gender: 02-07-1936 (80 y.o. Male) Treating RN: Curtis Sites Primary Care Physician: Dorothey Baseman Other Clinician: Referring Physician: Dorothey Baseman Treating Physician/Extender: Rudene Re in Treatment: 62 Education Assessment Education Provided To: Patient Education Topics Provided Wound/Skin Impairment: Handouts: Other: do not get wrap wet Methods: Demonstration, Explain/Verbal Responses: State content correctly Electronic Signature(s) Signed: 09/19/2015 6:02:03 PM By: Curtis Sites Entered By: Curtis Sites on 09/19/2015 11:47:12 [name], [name] Noa.  (  [id number]) -------------------------------------------------------------------------------- Wound Assessment Details Patient Name: [name], [name] Date of Service: 09/19/2015 10:45 AM Medical Record Number: [id number] Patient Account Number: [id number] Date of Birth/Sex: 03/20/1936 (80 y.o. Male) Treating RN: Curtis Sites Primary Care Physician: Terance Hart, DAVID Other Clinician: Referring Physician: Terance Hart, DAVID Treating Physician/Extender: Rudene Re in Treatment: 35 Wound Status Wound Number: 5 Primary Venous Leg Ulcer Etiology: Wound Location: Right, Medial Lower Leg Wound Status: Open Wounding Event: Gradually Appeared Comorbid Cataracts, Anemia, Hypertension, Date Acquired: 11/13/2014 History: Osteoarthritis Weeks Of Treatment: 35 Clustered Wound: No Photos Photo Uploaded By: Curtis Sites on 09/19/2015 17:29:16 Wound Measurements Length: (cm) 2.6 Width: (cm) 0.7 Depth: (cm) 0.1 Area: (cm) 1.429 Volume: (cm) 0.143 % Reduction in Area: 98.6% % Reduction in Volume: 99.5% Epithelialization: Medium (34-66%) Tunneling: No Undermining: No Wound Description Full Thickness Without Exposed Classification: Support Structures Wound Margin: Distinct, outline attached Exudate Medium Amount: Exudate Type: Serosanguineous Exudate Color: red, brown Foul Odor After Cleansing: No Wound Bed Granulation Amount: Large (67-100%) Exposed Structure Granulation Quality: Pink, Pale Fascia Exposed: No Necrotic Amount: None Present (0%) Fat Layer Exposed: No [name], [name] ([id number]) Tendon Exposed: No Muscle Exposed: No Joint Exposed: No Bone Exposed: No Limited to Skin Breakdown Periwound Skin Texture Texture Color No Abnormalities Noted: No No Abnormalities Noted: No Callus: No Atrophie Blanche: No Crepitus: No Cyanosis: No Excoriation: No Ecchymosis: No Fluctuance: No Erythema: No Friable: No Hemosiderin Staining: No Induration:  No Mottled: No Localized Edema: No Pallor: No Rash: No Rubor: No Scarring: No Temperature / Pain Moisture Temperature: No Abnormality No Abnormalities Noted: No Dry / Scaly: No Maceration: No Moist: Yes Wound Preparation Ulcer Cleansing: Other: soap and water, Topical Anesthetic Applied: None Treatment Notes Wound #5 (Right, Medial Lower Leg) 1. Cleansed with: Clean wound with Normal Saline 2. Anesthetic Topical Lidocaine 4% cream to wound bed prior to debridement 3. Peri-wound Care: Skin Prep 4. Dressing Applied: Mepitel Other dressing (specify in notes) 5. Secondary Dressing Applied ABD Pad 7. Secured with 4-Layer Compression System - Right Lower Extremity Notes collagen dressing applied by Dr. Meyer Russel today carboflex [name], [name] ([id number]) Electronic Signature(s) Signed: 09/19/2015 6:02:03 PM By: Curtis Sites Entered By: Curtis Sites on 09/19/2015 11:22:02 [name], [name] ([id number]) -------------------------------------------------------------------------------- Vitals Details Patient Name: [name], [name] Date of Service: 09/19/2015 10:45 AM Medical Record Number: [id number] Patient Account Number: [id number] Date of Birth/Sex: 1935-08-28 (80 y.o. Male) Treating RN: Curtis Sites Primary Care Physician: Terance Hart, DAVID Other Clinician: Referring Physician: Terance Hart, DAVID Treating Physician/Extender: Rudene Re in Treatment: 35 Vital Signs Time Taken: 11:03 Temperature (F): 98.4 Height (in): 68 Pulse (bpm): 67 Respiratory Rate (breaths/min): 20 Blood Pressure (mmHg): 144/63 Reference Range: 80 - 120 mg / dl Electronic Signature(s) Signed: 09/19/2015 6:02:03 PM By: Curtis Sites Entered By: Curtis Sites on 09/19/2015 11:04:02

## 2015-09-20 NOTE — Progress Notes (Signed)
[name] ([id number]) Visit Report for 09/19/2015 Chief Complaint Document Details Patient Name: [name], [name] Date of Service: 09/19/2015 10:45 AM Medical Record Number: [id number] Patient Account Number: [id number] Date of Birth/Sex: 09/22/1935 (80 y.o. Male) Treating RN: Curtis Sites Primary Care Physician: Dorothey Baseman Other Clinician: Referring Physician: Dorothey Baseman Treating Physician/Extender: Rudene Re in Treatment: 35 Information Obtained from: Patient Chief Complaint Patient presents for treatment of an open ulcer due to venous insufficiency. The patient has had a open wound on his right medial ankle for at least 3 years and was last seen in the wound clinic in February 2015. He was lost to follow-up after that. Electronic Signature(s) Signed: 09/19/2015 11:34:02 AM By: Evlyn Kanner MD, FACS Entered By: Evlyn Kanner on 09/19/2015 11:34:02 [name] ([id number]) -------------------------------------------------------------------------------- HPI Details Patient Name: [name] Date of Service: 09/19/2015 10:45 AM Medical Record Number: [id number] Patient Account Number: [id number] Date of Birth/Sex: October 04, 1935 (80 y.o. Male) Treating RN: Curtis Sites Primary Care Physician: Dorothey Baseman Other Clinician: Referring Physician: Dorothey Baseman Treating Physician/Extender: Rudene Re in Treatment: 35 History of Present Illness Location: right lower extremity ulceration Quality: Patient reports experiencing a dull pain to affected area(s). Severity: Patient states wound are getting worse. 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 Modifying Factors: Consults to this date include:has received treatment in the wound center in the past but stopped coming here since February 2015 Associated Signs and Symptoms: Patient reports having difficulty standing for long periods. HPI Description: 80 year old male who is known to have progressive weakness and has been in a nursing home for a while has had chronic lower extremity edema both legs and a large open wound on the right lower extremity which is has at least for about 3-4 years. The patient was seen in the wound clinic before and has been treated until February 2015 when he was lost to follow-up. Past medical history is significant for hypertension, gout, venous stasis ulcers, Parkinson's [name], constipation. He's not been a diabetic but his last hemoglobin A1c was 6 in March 2015. There is documentation that he's had endovenous ablation of bilateral varicose veins but we have no documentation about this and this may be done at least 4-5 years ago. 01/22/2015 -- he has his vascular test is scheduled for this afternoon. 01/29/2015 -- the patient's vascular test was canceled last week because he was unable to get onto the examining bed at [facility]. His Unna's boot was not applied either and the patient had a dressing placed by home health. Today when his dressing was removed we found a lot of maggots in his right lower extremity. 02/07/2015 -- Was seen in the vascular office by the PA and she noted that a bilateral venous duplex study did not show any DVT, SVT or reflux bilaterally. The patient was advised to continue with Unna's boot and would at some stage to require graduated compression stockings of the 20-30 mmHg variety. In the future lymphedema pumps would also be considered. 02/14/2015 -- his dressing is smelling quite a bit and there is a discoloration of the wound suggestive of an infection. Deep tissue cultures will be taken today. 02/21/2015 --  the culture  is back and it has growth moderate growth of Proteus and gram-negative rods sensitive to sulfa, ampicillin, cephazolin, Zosyn. He is already on doxycycline and his wound is looking clean and hence we will continue with this. 02/28/2015 -- he's been doing fine still on his antibiotics and has no fresh issues. 03/07/2015 -- I was told that because he lives in a nursing home the Apligraf was denied by his insurance company. 03/14/2015 -- we are awaiting samples and a trial of Puraply to be tried for his ulceration. 03/21/2015 -- his Apligraf was approved and he is here for his first application of Apligraf 03/28/2015 -- his wound has had quite a bit of secretion and needs to be changed and hence I will use the [name], [name] ([id number]) second application of Apligraf. 04/04/2015 -- he is here for a wound check but has a lot of secretions and hence his dressing needs to be taken down. 04/09/2015 -- he is here for his third application of Apligraf 04/18/2015 -- he is here for a wound check and though he does not have any overt infection he always has a foul odor to his leg. 04/25/2015 -- his wound has been doing much better and he has minimal drainage and the size is smaller. Of note he is on doxycycline. 05/02/2015 -- he is here for his fourth application of Apligraf 05/09/2015 -- he is here for a wound check and is bothered by a lot of odor from the wound. 05/16/2015 -- he has done very well, the odor and drainage is less with the wound dressing having been changed twice this week. He is here for his last application of Apligraf. 06/20/2015 -- an area superior to the previous wound has now opened up and this is a fairly superficial ulceration. 07/12/2015 -- he is here for his first application of Puraply. This is a sample and this is no charge. 07/18/2015 -- he is here for his second application of Puraply. This is a sample and this is no charge. 07/25/2015 -- he is here for his third  application of Puraply. This is a sample and this is no charge. 08/01/2015 -- he is here for his fourth application of Puraply. This is a sample and this is no charge. 08/08/2015 -- he is here for his fifth application of Puraply and this is a sample at no charge. 08/15/2015 -- he is here for his sixth application of Puraply and this is a sample at no charge. 08/29/2015 -- he is here for his eight application of Puraply and this is a sample at no charge. 09/05/2015 -- he is here for his nineth application of Puraply and this is a sample at no charge. 09/19/2015 -- this is the second week of using the collagen substitute and he is doing very well. Electronic Signature(s) Signed: 09/19/2015 11:35:15 AM By: Evlyn Kanner MD, FACS Entered By: Evlyn Kanner on 09/19/2015 11:35:14 [name], [name] ([id number]) -------------------------------------------------------------------------------- Physical Exam Details Patient Name: [name], [name] Date of Service: 09/19/2015 10:45 AM Medical Record Number: [id number] Patient Account Number: [id number] Date of Birth/Sex: 1936-01-11 (80 y.o. Male) Treating RN: Curtis Sites Primary Care Physician: Dorothey Baseman Other Clinician: Referring Physician: Terance Hart, DAVID Treating Physician/Extender: Rudene Re in Treatment: 35 Constitutional . Pulse regular. Respirations normal and unlabored. Afebrile. . Eyes Nonicteric. Reactive to light. Ears, Nose, Mouth, and Throat Lips, teeth, and gums WNL.Marland Kitchen Moist mucosa without lesions. Neck supple and nontender. No palpable supraclavicular or cervical adenopathy. Normal sized  without goiter. Respiratory WNL. No retractions.. Cardiovascular Pedal Pulses WNL. No clubbing, cyanosis or edema. Lymphatic No adneopathy. No adenopathy. No adenopathy. Musculoskeletal Adexa without tenderness or enlargement.. Digits and nails w/o clubbing, cyanosis, infection, petechiae, ischemia, or inflammatory  conditions.. Integumentary (Hair, Skin) No suspicious lesions. No crepitus or fluctuance. No peri-wound warmth or erythema. No masses.Marland Kitchen Psychiatric Judgement and insight Intact.. No evidence of depression, anxiety, or agitation.. Notes the wound is looking excellent and has healthy granulation tissue and has come down in size markedly. We are using the remaining collagen on his wound. Electronic Signature(s) Signed: 09/19/2015 11:35:58 AM By: Evlyn Kanner MD, FACS Entered By: Evlyn Kanner on 09/19/2015 11:35:57 [name], [name] ([id number]) -------------------------------------------------------------------------------- Physician Orders Details Patient Name: [name], [name] Date of Service: 09/19/2015 10:45 AM Medical Record Number: [id number] Patient Account Number: [id number] Date of Birth/Sex: Oct 24, 1935 (80 y.o. Male) Treating RN: Curtis Sites Primary Care Physician: Dorothey Baseman Other Clinician: Referring Physician: Dorothey Baseman Treating Physician/Extender: Rudene Re in Treatment: 25 Verbal / Phone Orders: Yes Clinician: Curtis Sites Read Back and Verified: Yes Diagnosis Coding Wound Cleansing Wound #5 Right,Medial Lower Leg o Cleanse wound with mild soap and water o No tub bath. Skin Barriers/Peri-Wound Care Wound #5 Right,Medial Lower Leg o Moisturizing lotion Primary Wound Dressing Wound #5 Right,Medial Lower Leg o Other: - collagen dressing applied by Dr Meyer Russel today in clinic Secondary Dressing Wound #5 Right,Medial Lower Leg o ABD pad - Carboflex and ABD; gauze to bolster o Mepitel One - over PuraPly Dressing Change Frequency Wound #5 Right,Medial Lower Leg o Change dressing every week Follow-up Appointments Wound #5 Right,Medial Lower Leg o Return Appointment in 1 week. Edema Control Wound #5 Right,Medial Lower Leg o 4-Layer Compression System - Right Lower Extremity Electronic Signature(s) Signed: 09/19/2015  4:33:50 PM By: Evlyn Kanner MD, FACS Signed: 09/19/2015 6:02:03 PM By: Ebbie Ridge ([id number]) Entered By: Curtis Sites on 09/19/2015 11:25:28 [name], [name] ([id number]) -------------------------------------------------------------------------------- Problem List Details Patient Name: [name], [name] Date of Service: 09/19/2015 10:45 AM Medical Record Number: [id number] Patient Account Number: [id number] Date of Birth/Sex: 03/01/1936 (80 y.o. 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 Date of Service: 09/19/2015 10:45 AM Medical Record Number: [id number] Patient Account Number: [id number] Date of Birth/Sex: 1936/06/09 (80 y.o. Male) Treating RN: Curtis Sites Primary Care Physician: Dorothey Baseman Other Clinician: Referring Physician: Dorothey Baseman Treating Physician/Extender: Rudene Re in Treatment: 35 Subjective Chief Complaint Information obtained from Patient Patient presents for treatment of an open ulcer due to venous insufficiency. The patient has had a open wound on  his right medial ankle for at least 3 years and was last seen in the wound clinic in February 2015. He was lost to follow-up after that. History of Present Illness (HPI) The following HPI elements were documented for the patient's wound: Location: right lower extremity ulceration Quality: Patient reports experiencing a dull  pain to affected area(s). Severity: Patient states wound are getting worse. 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 Modifying Factors: Consults to this date include:has received treatment in the wound center in the past but stopped coming here since February 2015 Associated Signs and Symptoms: Patient reports having difficulty standing for long periods. 80 year old male who is known to have progressive weakness and has been in a nursing home for a while has had chronic lower extremity edema both legs and a large open wound on the right lower extremity which is has at least for about 3-4 years. The patient was seen in the wound clinic before and has been treated until February 2015 when he was lost to follow-up. Past medical history is significant for hypertension, gout, venous stasis ulcers, Parkinson's [name], constipation. He's not been a diabetic but his last hemoglobin A1c was 6 in March 2015. There is documentation that he's had endovenous ablation of bilateral varicose veins but we have no documentation about this and this may be done at least 4-5 years ago. 01/22/2015 -- he has his vascular test is scheduled for this afternoon. 01/29/2015 -- the patient's vascular test was canceled last week because he was unable to get onto the examining bed at [facility]. His Unna's boot was  not applied either and the patient had a dressing placed by home health. Today when his dressing was removed we found a lot of maggots in his right lower extremity. 02/07/2015 -- Was seen in the vascular office by the PA and she noted that a bilateral venous duplex study did not show any DVT, SVT or reflux bilaterally. The patient was advised to continue with Unna's boot and would at some stage to require graduated compression stockings of the 20-30 mmHg variety. In the future lymphedema pumps would also be considered. 02/14/2015 -- his dressing is smelling quite a bit and there is a discoloration of the wound suggestive of an [name], [name] ([id number]) infection. Deep tissue cultures will be taken today. 02/21/2015 -- the culture is back and it has growth moderate growth of Proteus and gram-negative rods sensitive to sulfa, ampicillin, cephazolin, Zosyn. He is already on doxycycline and his wound is looking clean and hence we will continue with this. 02/28/2015 -- he's been doing fine still on his antibiotics and has no fresh issues. 03/07/2015 -- I was told that because he lives in a nursing home the Apligraf was denied by his insurance company. 03/14/2015 -- we are awaiting samples and a trial of Puraply to be tried for his ulceration. 03/21/2015 -- his Apligraf was approved and he is here for his first application of Apligraf 03/28/2015 -- his wound has had quite a bit of secretion and needs to be changed and hence I will use the second application of Apligraf. 04/04/2015 -- he is here for a wound check but has a lot of secretions and hence his dressing needs to be taken down. 04/09/2015 -- he is here for his third application of Apligraf 04/18/2015 -- he is here for a wound check and though he does not have any overt infection he always has a foul odor to his leg. 04/25/2015 -- his wound  has been doing much better and he has minimal drainage and the size is smaller. Of note he is on  doxycycline. 05/02/2015 -- he is here for his fourth application of Apligraf 05/09/2015 -- he is here for a wound check and is bothered by a lot of odor from the wound. 05/16/2015 -- he has done very well, the odor and drainage is less with the wound dressing having been changed twice this week. He is here for his last application of Apligraf. 06/20/2015 -- an area superior to the previous wound has now opened up and this is a fairly superficial ulceration. 07/12/2015 -- he is here for his first application of Puraply. This is a sample and this is no charge. 07/18/2015 -- he is here for his second application of Puraply. This is a sample and this is no charge. 07/25/2015 -- he is here for his third application of Puraply. This is a sample and this is no charge. 08/01/2015 -- he is here for his fourth application of Puraply. This is a sample and this is no charge. 08/08/2015 -- he is here for his fifth application of Puraply and this is a sample at no charge. 08/15/2015 -- he is here for his sixth application of Puraply and this is a sample at no charge. 08/29/2015 -- he is here for his eight application of Puraply and this is a sample at no charge. 09/05/2015 -- he is here for his nineth application of Puraply and this is a sample at no charge. 09/19/2015 -- this is the second week of using the collagen substitute and he is doing very well. Objective Constitutional Pulse regular. Respirations normal and unlabored. Afebrile. Vitals Time Taken: 11:03 AM, Height: 68 in, Temperature: 98.4 F, Pulse: 67 bpm, Respiratory Rate: 20 breaths/min, Blood Pressure: 144/63 mmHg. MAMIE, PECORE ([id number]) Eyes Nonicteric. Reactive to light. Ears, Nose, Mouth, and Throat Lips, teeth, and gums WNL.Marland Kitchen Moist mucosa without lesions. Neck supple and nontender. No palpable supraclavicular or cervical adenopathy. Normal sized without goiter. Respiratory WNL. No retractions.. Cardiovascular Pedal Pulses  WNL. No clubbing, cyanosis or edema. Lymphatic No adneopathy. No adenopathy. No adenopathy. Musculoskeletal Adexa without tenderness or enlargement.. Digits and nails w/o clubbing, cyanosis, infection, petechiae, ischemia, or inflammatory conditions.Marland Kitchen Psychiatric Judgement and insight Intact.. No evidence of depression, anxiety, or agitation.. General Notes: the wound is looking excellent and has healthy granulation tissue and has come down in size markedly. We are using the remaining collagen on his wound. Integumentary (Hair, Skin) No suspicious lesions. No crepitus or fluctuance. No peri-wound warmth or erythema. No masses.. Wound #5 status is Open. Original cause of wound was Gradually Appeared. The wound is located on the Right,Medial Lower Leg. The wound measures 2.6cm length x 0.7cm width x 0.1cm depth; 1.429cm^2 area and 0.143cm^3 volume. The wound is limited to skin breakdown. There is no tunneling or undermining noted. There is a medium amount of serosanguineous drainage noted. The wound margin is distinct with the outline attached to the wound base. There is large (67-100%) pink, pale granulation within the wound bed. There is no necrotic tissue within the wound bed. The periwound skin appearance exhibited: Moist. The periwound skin appearance did not exhibit: Callus, Crepitus, Excoriation, Fluctuance, Friable, Induration, Localized Edema, Rash, Scarring, Dry/Scaly, Maceration, Atrophie Blanche, Cyanosis, Ecchymosis, Hemosiderin Staining, Mottled, Pallor, Rubor, Erythema. Periwound temperature was noted as No Abnormality. 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 Skin Barriers/Peri-Wound Care: Wound #5 Right,Medial Lower Leg: Moisturizing lotion Primary Wound Dressing: Wound #5 Right,Medial Lower Leg: Other: - collagen dressing applied by Dr Meyer Russel today in clinic Secondary Dressing: Wound #5 Right,Medial Lower Leg: ABD pad - Carboflex and ABD; gauze to bolster Mepitel One - over PuraPly Dressing Change Frequency: Wound #5 Right,Medial Lower Leg: Change dressing every week Follow-up Appointments: Wound #5 Right,Medial Lower Leg: Return Appointment in 1 week. Edema Control: Wound #5 Right,Medial Lower Leg: 4-Layer Compression System - Right Lower Extremity He has had his collagen dressing applied and we will then bolstered in place and apply a 4-layer Profore. He will be back to see [place] next week. GENEVA, PALLAS ([id number]) Electronic Signature(s) Signed: 09/19/2015 11:36:28 AM By: Evlyn Kanner MD, FACS Entered By: Evlyn Kanner on 09/19/2015 11:36:28 AJDIN, MACKE ([id number]) -------------------------------------------------------------------------------- SuperBill Details Patient Name: [name], [name] Date of Service: 09/19/2015 Medical Record Number: [id number] Patient Account Number: [id number] Date of Birth/Sex: May 03, 1936 (80 y.o. 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 E66.01 Morbid (severe) obesity due to excess calories I89.0 Lymphedema, not elsewhere classified Facility Procedures CPT4: Description Modifier Quantity Code [id number] (Facility Use Only) [phone] - APPLY MULTLAY  COMPRS LWR RT 1 LEG Physician Procedures CPT4 Code Description: [id number] - WC PHYS LEVEL 3 - EST PT ICD-10 Description Diagnosis I87.311 Chronic venous hypertension (idiopathic) with ulcer of 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 E66.01 Morbid (severe) obesity due  to excess calories I89.0 Lymphedema, not elsewhere classified Modifier: right lower s of muscle Quantity: 1 extremity Electronic Signature(s) Signed: 09/19/2015 6:00:23 PM By: Curtis Sites Previous Signature: 09/19/2015 11:36:46 AM Version By: Evlyn Kanner MD, FACS Entered By: Curtis Sites on 09/19/2015 18:00:23

## 2015-09-26 ENCOUNTER — Encounter: Payer: Medicare Other | Admitting: Surgery

## 2015-09-26 DIAGNOSIS — I87311 Chronic venous hypertension (idiopathic) with ulcer of right lower extremity: Secondary | ICD-10-CM | POA: Diagnosis not present

## 2015-09-28 NOTE — Progress Notes (Signed)
DENZAL, MEIR ([id number]) Visit Report for 09/26/2015 Chief Complaint Document Details Patient Name: [name], [name] Date of Service: 09/26/2015 12:45 PM Medical Record Number: [id number] Patient Account Number: [id number] Date of Birth/Sex: 1935-07-16 (80 y.o. Male) Treating RN: Curtis Sites Primary Care Physician: Dorothey Baseman Other Clinician: Referring Physician: Dorothey Baseman Treating Physician/Extender: Rudene Re in Treatment: 32 Information Obtained from: Patient Chief Complaint Patient presents for treatment of an open ulcer due to venous insufficiency. The patient has had a open wound on his right medial ankle for at least 3 years and was last seen in the wound clinic in February 2015. He was lost to follow-up after that. Electronic Signature(s) Signed: 09/26/2015 2:10:28 PM By: Evlyn Kanner MD, FACS Entered By: Evlyn Kanner on 09/26/2015 14:10:27 WRIGHT, GRAVELY ([id number]) -------------------------------------------------------------------------------- HPI Details Patient Name: [name], [name]. Date of Service: 09/26/2015 12:45 PM Medical Record Number: [id number] Patient Account Number: [id number] Date of Birth/Sex: [birth year] (80 y.o. Male) Treating RN: Curtis Sites Primary Care Physician: Dorothey Baseman Other Clinician: Referring Physician: Dorothey Baseman Treating Physician/Extender: Rudene Re in Treatment: 36 History of Present Illness Location: right lower extremity ulceration Quality: Patient reports experiencing a dull pain to affected area(s). Severity: Patient states wound are getting worse. 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 Modifying Factors: Consults to this date include:has received treatment in the wound center in the past but stopped coming here since February 2015 Associated Signs and Symptoms: Patient reports having difficulty standing for long periods. HPI Description: 80 year old male who is known to have progressive weakness and has been in a nursing home for a while has had chronic lower extremity edema both legs and a large open wound on the right lower extremity which is has at least for about 3-4 years. The patient was seen in the wound clinic before and has been treated until February 2015 when he was lost to follow-up. Past medical history is significant for hypertension, gout, venous stasis ulcers, Parkinson's [name], constipation. He's not been a diabetic but his last hemoglobin A1c was 6 in March 2015. There is documentation that he's had endovenous ablation of bilateral varicose veins but we have no documentation about this and this may be done at least 4-5 years ago. 01/22/2015 -- he has his vascular test is scheduled for this afternoon. 01/29/2015 -- the patient's vascular test was canceled last week because he was unable to get onto the examining bed at [facility]. His Unna's boot was not applied either and the patient had a dressing placed by home health. Today when his dressing was removed we found a lot of maggots in his right lower extremity. 02/07/2015 -- Was seen in the vascular office by the PA and she noted that a bilateral venous duplex study did not show any DVT, SVT or reflux bilaterally. The patient was advised to continue with Unna's boot and would at some stage to require graduated compression stockings of the 20-30 mmHg variety. In the future lymphedema pumps would also be considered. 02/14/2015 -- his dressing is smelling quite a bit and there is a discoloration of the wound suggestive of an infection. Deep tissue cultures will be taken today. 02/21/2015 --  the culture  is back and it has growth moderate growth of Proteus and gram-negative rods sensitive to sulfa, ampicillin, cephazolin, Zosyn. He is already on doxycycline and his wound is looking clean and hence we will continue with this. 02/28/2015 -- he's been doing fine still on his antibiotics and has no fresh issues. 03/07/2015 -- I was told that because he lives in a nursing home the Apligraf was denied by his insurance company. 03/14/2015 -- we are awaiting samples and a trial of Puraply to be tried for his ulceration. 03/21/2015 -- his Apligraf was approved and he is here for his first application of Apligraf 03/28/2015 -- his wound has had quite a bit of secretion and needs to be changed and hence I will use the LOWERY, PAULLIN. ([id number]) second application of Apligraf. 04/04/2015 -- he is here for a wound check but has a lot of secretions and hence his dressing needs to be taken down. 04/09/2015 -- he is here for his third application of Apligraf 04/18/2015 -- he is here for a wound check and though he does not have any overt infection he always has a foul odor to his leg. 04/25/2015 -- his wound has been doing much better and he has minimal drainage and the size is smaller. Of note he is on doxycycline. 05/02/2015 -- he is here for his fourth application of Apligraf 05/09/2015 -- he is here for a wound check and is bothered by a lot of odor from the wound. 05/16/2015 -- he has done very well, the odor and drainage is less with the wound dressing having been changed twice this week. He is here for his last application of Apligraf. 06/20/2015 -- an area superior to the previous wound has now opened up and this is a fairly superficial ulceration. 07/12/2015 -- he is here for his first application of Puraply. This is a sample and this is no charge. 07/18/2015 -- he is here for his second application of Puraply. This is a sample and this is no charge. 07/25/2015 -- he is here for his third  application of Puraply. This is a sample and this is no charge. 08/01/2015 -- he is here for his fourth application of Puraply. This is a sample and this is no charge. 08/08/2015 -- he is here for his fifth application of Puraply and this is a sample at no charge. 08/15/2015 -- he is here for his sixth application of Puraply and this is a sample at no charge. 08/29/2015 -- he is here for his eight application of Puraply and this is a sample at no charge. 09/05/2015 -- he is here for his nineth application of Puraply and this is a sample at no charge. 09/19/2015 -- this is the second week of using the collagen substitute and he is doing very well. 09/26/2015 -- this is the third week of using the collagen substitute. Electronic Signature(s) Signed: 09/26/2015 2:11:18 PM By: Evlyn Kanner MD, FACS Entered By: Evlyn Kanner on 09/26/2015 14:11:18 KARIS, EMIG ([id number]) -------------------------------------------------------------------------------- Physical Exam Details Patient Name: DIANNE, BADY. Date of Service: 09/26/2015 12:45 PM Medical Record Number: [id number] Patient Account Number: [id number] Date of Birth/Sex: 1936/04/10 (80 y.o. Male) Treating RN: Curtis Sites Primary Care Physician: Dorothey Baseman Other Clinician: Referring Physician: Terance Hart, DAVID Treating Physician/Extender: Rudene Re in Treatment: 36 Constitutional . Pulse regular. Respirations normal and unlabored. Afebrile. . Eyes Nonicteric. Reactive to light. Ears, Nose, Mouth, and Throat Lips, teeth, and gums WNL.Marland Kitchen Moist mucosa without lesions.  Neck supple and nontender. No palpable supraclavicular or cervical adenopathy. Normal sized without goiter. Respiratory WNL. No retractions.. Cardiovascular Pedal Pulses WNL. No clubbing, cyanosis or edema. Lymphatic No adneopathy. No adenopathy. No adenopathy. Musculoskeletal Adexa without tenderness or enlargement.. Digits and nails w/o  clubbing, cyanosis, infection, petechiae, ischemia, or inflammatory conditions.. Integumentary (Hair, Skin) No suspicious lesions. No crepitus or fluctuance. No peri-wound warmth or erythema. No masses.Marland Kitchen Psychiatric Judgement and insight Intact.. No evidence of depression, anxiety, or agitation.. Notes the main wound is looking excellent with a lot of new skin coming around but around this he has got a few satellite areas which have opened out to superficial ulcerations. This may be due to trauma. I am going to use the remaining collagen on his wound. Electronic Signature(s) Signed: 09/26/2015 2:12:12 PM By: Evlyn Kanner MD, FACS Entered By: Evlyn Kanner on 09/26/2015 14:12:12 BENUEL, LY ([id number]) -------------------------------------------------------------------------------- Physician Orders Details Patient Name: GEOVONNI, MEYERHOFF. Date of Service: 09/26/2015 12:45 PM Medical Record Number: [id number] Patient Account Number: [id number] Date of Birth/Sex: 1936/05/01 (80 y.o. Male) Treating RN: Curtis Sites Primary Care Physician: Dorothey Baseman Other Clinician: Referring Physician: Dorothey Baseman Treating Physician/Extender: Rudene Re in Treatment: 24 Verbal / Phone Orders: Yes Clinician: Curtis Sites Read Back and Verified: Yes Diagnosis Coding Wound Cleansing Wound #5 Right,Medial Lower Leg o Cleanse wound with mild soap and water o No tub bath. Skin Barriers/Peri-Wound Care Wound #5 Right,Medial Lower Leg o Moisturizing lotion Primary Wound Dressing Wound #5 Right,Medial Lower Leg o Other: - collagen dressing applied by Dr Meyer Russel today in clinic Secondary Dressing Wound #5 Right,Medial Lower Leg o ABD pad - Carboflex and ABD; gauze to bolster o Mepitel One - over collagen Dressing Change Frequency Wound #5 Right,Medial Lower Leg o Change dressing every week Follow-up Appointments Wound #5 Right,Medial Lower Leg o Return  Appointment in 1 week. Edema Control Wound #5 Right,Medial Lower Leg o 4-Layer Compression System - Right Lower Extremity Electronic Signature(s) Signed: 09/26/2015 3:56:22 PM By: Evlyn Kanner MD, FACS Signed: 09/26/2015 5:19:20 PM By: Ebbie Ridge ([id number]) Entered By: Curtis Sites on 09/26/2015 14:04:19 SHI, BLANKENSHIP ([id number]) -------------------------------------------------------------------------------- Problem List Details Patient Name: CODY, ALBUS. Date of Service: 09/26/2015 12:45 PM Medical Record Number: [id number] Patient Account Number: [id number] Date of Birth/Sex: [birth year] (80 y.o. 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 Date of Service: 09/26/2015 12:45 PM Medical Record Number: [id number] Patient Account Number: [id number] Date of Birth/Sex: 1935/09/07 (80 y.o. Male) Treating RN: Curtis Sites Primary Care Physician: Dorothey Baseman Other Clinician: Referring Physician: Dorothey Baseman Treating Physician/Extender: Rudene Re in Treatment:  31 Subjective Chief Complaint Information obtained from Patient Patient presents for treatment of an open ulcer due to venous insufficiency. The patient has had a open wound on his right medial ankle for at least 3 years and was last seen in the wound clinic in February  2015. He was lost to follow-up after that. History of Present Illness (HPI) The following HPI elements were documented for the patient's wound: Location: right lower extremity ulceration Quality: Patient reports experiencing a dull pain to affected area(s). Severity: Patient states wound are getting worse. 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 Modifying Factors: Consults to this date include:has received treatment in the wound center in the past but stopped coming here since February 2015 Associated Signs and Symptoms: Patient reports having difficulty standing for long periods. 80 year old male who is known to have progressive weakness and has been in a nursing home for a while has had chronic lower extremity edema both legs and a large open wound on the right lower extremity which is has at least for about 3-4 years. The patient was seen in the wound clinic before and has been treated until February 2015 when he was lost to follow-up. Past medical history is significant for hypertension, gout, venous stasis ulcers, Parkinson's [name], constipation. He's not been a diabetic but his last hemoglobin A1c was 6 in March 2015. There is documentation that he's had endovenous ablation of bilateral varicose veins but we have no documentation about this and this may be done at least 4-5 years ago. 01/22/2015 -- he has his vascular  test is scheduled for this afternoon. 01/29/2015 -- the patient's vascular test was canceled last week because he was unable to get onto the examining bed at [facility]. His Unna's boot was not applied either and the patient had a dressing placed by home health. Today when his dressing was removed we found a lot of maggots in his right lower extremity. 02/07/2015 -- Was seen in the vascular office by the PA and she noted that a bilateral venous duplex study did not show any DVT, SVT or reflux bilaterally. The patient was advised to continue with Unna's boot and would at some stage to require graduated compression stockings of the 20-30 mmHg variety. In the future lymphedema pumps would also be considered. 02/14/2015 -- his dressing is smelling quite a bit and there is a discoloration of the wound suggestive of an KEVAUGHN, CUTBIRTH. ([id number]) infection. Deep tissue cultures will be taken today. 02/21/2015 -- the culture is back and it has growth moderate growth of Proteus and gram-negative rods sensitive to sulfa, ampicillin, cephazolin, Zosyn. He is already on doxycycline and his wound is looking clean and hence we will continue with this. 02/28/2015 -- he's been doing fine still on his antibiotics and has no fresh issues. 03/07/2015 -- I was told that because he lives in a nursing home the Apligraf was denied by his insurance company. 03/14/2015 -- we are awaiting samples and a trial of Puraply to be tried for his ulceration. 03/21/2015 -- his Apligraf was approved and he is here for his first application of Apligraf 03/28/2015 -- his wound has had quite a bit of secretion and needs to be changed and hence I will use the second application of Apligraf. 04/04/2015 -- he is here for a wound check but has a lot of secretions and hence his dressing needs to be taken down. 04/09/2015 -- he is here for his third  application of Apligraf 04/18/2015 -- he is here for a wound check and though he does not  have any overt infection he always has a foul odor to his leg. 04/25/2015 -- his wound has been doing much better and he has minimal drainage and the size is smaller. Of note he is on doxycycline. 05/02/2015 -- he is here for his fourth application of Apligraf 05/09/2015 -- he is here for a wound check and is bothered by a lot of odor from the wound. 05/16/2015 -- he has done very well, the odor and drainage is less with the wound dressing having been changed twice this week. He is here for his last application of Apligraf. 06/20/2015 -- an area superior to the previous wound has now opened up and this is a fairly superficial ulceration. 07/12/2015 -- he is here for his first application of Puraply. This is a sample and this is no charge. 07/18/2015 -- he is here for his second application of Puraply. This is a sample and this is no charge. 07/25/2015 -- he is here for his third application of Puraply. This is a sample and this is no charge. 08/01/2015 -- he is here for his fourth application of Puraply. This is a sample and this is no charge. 08/08/2015 -- he is here for his fifth application of Puraply and this is a sample at no charge. 08/15/2015 -- he is here for his sixth application of Puraply and this is a sample at no charge. 08/29/2015 -- he is here for his eight application of Puraply and this is a sample at no charge. 09/05/2015 -- he is here for his nineth application of Puraply and this is a sample at no charge. 09/19/2015 -- this is the second week of using the collagen substitute and he is doing very well. 09/26/2015 -- this is the third week of using the collagen substitute. Objective Constitutional Pulse regular. Respirations normal and unlabored. Afebrile. Vitals Time Taken: 1:04 PM, Height: 68 in, Temperature: 98.2 F, Pulse: 75 bpm, Respiratory Rate: 20 breaths/min, Blood Pressure: 107/51 mmHg. ABUBAKR, WIEMAN ([id number]) Eyes Nonicteric. Reactive to light. Ears,  Nose, Mouth, and Throat Lips, teeth, and gums WNL.Marland Kitchen Moist mucosa without lesions. Neck supple and nontender. No palpable supraclavicular or cervical adenopathy. Normal sized without goiter. Respiratory WNL. No retractions.. Cardiovascular Pedal Pulses WNL. No clubbing, cyanosis or edema. Lymphatic No adneopathy. No adenopathy. No adenopathy. Musculoskeletal Adexa without tenderness or enlargement.. Digits and nails w/o clubbing, cyanosis, infection, petechiae, ischemia, or inflammatory conditions.Marland Kitchen Psychiatric Judgement and insight Intact.. No evidence of depression, anxiety, or agitation.. General Notes: the main wound is looking excellent with a lot of new skin coming around but around this he has got a few satellite areas which have opened out to superficial ulcerations. This may be due to trauma. I am going to use the remaining collagen on his wound. Integumentary (Hair, Skin) No suspicious lesions. No crepitus or fluctuance. No peri-wound warmth or erythema. No masses.. Wound #5 status is Open. Original cause of wound was Gradually Appeared. The wound is located on the Right,Medial Lower Leg. The wound measures 2.3cm length x 0.5cm width x 0.1cm depth; 0.903cm^2 area and 0.09cm^3 volume. The wound is limited to skin breakdown. There is a medium amount of serosanguineous drainage noted. The wound margin is distinct with the outline attached to the wound base. There is large (67-100%) pink, pale granulation within the wound bed. There is no necrotic tissue within the wound bed. The periwound skin  appearance exhibited: Moist. The periwound skin appearance did not exhibit: Callus, Crepitus, Excoriation, Fluctuance, Friable, Induration, Localized Edema, Rash, Scarring, Dry/Scaly, Maceration, Atrophie Blanche, Cyanosis, Ecchymosis, Hemosiderin Staining, Mottled, Pallor, Rubor, Erythema. Periwound temperature was noted as No Abnormality. 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 Skin Barriers/Peri-Wound Care: Wound #5 Right,Medial Lower Leg: Moisturizing lotion Primary Wound Dressing: Wound #5 Right,Medial Lower Leg: Other: - collagen dressing applied by Dr Meyer Russel today in clinic Secondary Dressing: Wound #5 Right,Medial Lower Leg: ABD pad - Carboflex and ABD; gauze to bolster Mepitel One - over collagen Dressing Change Frequency: Wound #5 Right,Medial Lower Leg: Change dressing every week Follow-up Appointments: Wound #5 Right,Medial Lower Leg: Return Appointment in 1 week. Edema Control: Wound #5 Right,Medial Lower Leg: 4-Layer Compression System - Right Lower Extremity the main wound is looking excellent with a lot of new skin coming around but around this he has got a few satellite areas which have opened out to superficial ulcerations. This may be due to trauma. I am going to use the remaining collagen on his wound. ERNESTO, ZUKOWSKI ([id number]) Electronic Signature(s) Signed: 09/26/2015 2:12:23 PM By: Evlyn Kanner MD, FACS Entered By: Evlyn Kanner on 09/26/2015 14:12:23 TYNAN, BOESEL ([id number]) -------------------------------------------------------------------------------- SuperBill Details Patient Name: LINVILLE, DECAROLIS. Date of Service: 09/26/2015 Medical Record Number: [id number] Patient Account Number: [id number] Date of Birth/Sex: 06/05/1936 (80 y.o. 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 E66.01 Morbid (severe) obesity due to excess calories I89.0 Lymphedema, not elsewhere classified Facility Procedures CPT4: Description Modifier Quantity Code [id number] (Facility Use Only) [id number] - APPLY MULTLAY COMPRS LWR RT 1 LEG Physician Procedures CPT4 Code Description: [id number] - WC PHYS LEVEL 3 - EST PT ICD-10 Description Diagnosis I87.311 Chronic venous hypertension (idiopathic) with ulcer of 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 E66.01 Morbid (severe) obesity due  to excess calories I89.0 Lymphedema, not elsewhere classified Modifier: right lower s of muscle Quantity: 1 extremity Electronic Signature(s) Signed: 09/26/2015 3:56:22 PM By: Evlyn Kanner MD, FACS Signed: 09/26/2015 5:19:20 PM By: Curtis Sites Previous Signature: 09/26/2015 2:12:38 PM Version By: Evlyn Kanner MD, FACS Entered By: Curtis Sites on 09/26/2015 14:59:05

## 2015-09-28 NOTE — Progress Notes (Signed)
PAYNE, GARSKE ([id number]) Visit Report for 09/26/2015 Arrival Information Details Patient Name: [name], [name] Date of Service: 09/26/2015 12:45 PM Medical Record Number: [id number] Patient Account Number: [id number] Date of Birth/Sex: 12-Jul-1935 (80 y.o. Male) Treating RN: Curtis Sites Primary Care Physician: Dorothey Baseman Other Clinician: Referring Physician: Terance Hart, DAVID Treating Physician/Extender: Rudene Re in Treatment: 36 Visit Information History Since Last Visit Added or deleted any medications: No Patient Arrived: Wheel Chair Any new allergies or adverse reactions: No Arrival Time: 12:57 Had a fall or experienced change in No activities of daily living that may affect Accompanied By: self risk of falls: Transfer Assistance: None Signs or symptoms of abuse/neglect since last No Patient Identification Verified: Yes visito Secondary Verification Process Yes Hospitalized since last visit: No Completed: Pain Present Now: No Patient Requires Transmission-Based No Precautions: Patient Has Alerts: No Electronic Signature(s) Signed: 09/26/2015 5:19:20 PM By: Curtis Sites Entered By: Curtis Sites on 09/26/2015 13:04:01 [name] ([id number]) -------------------------------------------------------------------------------- Encounter Discharge Information Details Patient Name: [name] Date of Service: 09/26/2015 12:45 PM Medical Record Number: [id number] Patient Account Number: [id number] Date of Birth/Sex: Dec 07, 1935 (80 y.o. Male) Treating RN: Curtis Sites Primary Care Physician: Dorothey Baseman Other Clinician: Referring Physician: Dorothey Baseman Treating Physician/Extender: Rudene Re in Treatment: 62 Encounter Discharge Information Items Discharge Pain Level: 0 Discharge Condition: Stable Ambulatory Status: Wheelchair Discharge Destination: Nursing Home Transportation: Private Auto Accompanied By:  self Schedule Follow-up Appointment: Yes Medication Reconciliation completed and provided to Patient/Care No Kandis Henry: Provided on Clinical Summary of Care: 09/26/2015 Form Type Recipient Paper Patient [facility] Electronic Signature(s) Signed: 09/26/2015 5:19:20 PM By: Curtis Sites Previous Signature: 09/26/2015 2:15:51 PM Version By: Gwenlyn Perking Entered By: Curtis Sites on 09/26/2015 14:59:59 Bear, Osten Chrissie Noa ([id number]) -------------------------------------------------------------------------------- Multi Wound Chart Details Patient Name: [name] Date of Service: 09/26/2015 12:45 PM Medical Record Number: [id number] Patient Account Number: [id number] Date of Birth/Sex: 01-06-1936 (80 y.o. Male) Treating RN: Curtis Sites Primary Care Physician: Dorothey Baseman Other Clinician: Referring Physician: Terance Hart, DAVID Treating Physician/Extender: Rudene Re in Treatment: 36 Vital Signs Height(in): 68 Pulse(bpm): 75 Weight(lbs): Blood Pressure 107/51 (mmHg): Body Mass Index(BMI): Temperature(F): 98.2 Respiratory Rate 20 (breaths/min): Photos: [5:No Photos] [N/A:N/A] Wound Location: [5:Right Lower Leg - Medial] [N/A:N/A] Wounding Event: [5:Gradually Appeared] [N/A:N/A] Primary Etiology: [5:Venous Leg Ulcer] [N/A:N/A] Comorbid History: [5:Cataracts, Anemia, Hypertension, Osteoarthritis] [N/A:N/A] Date Acquired: [5:11/13/2014] [N/A:N/A] Weeks of Treatment: [5:36] [N/A:N/A] Wound Status: [5:Open] [N/A:N/A] Measurements L x W x D 2.3x0.5x0.1 [N/A:N/A] (cm) Area (cm) : [5:0.903] [N/A:N/A] Volume (cm) : [5:0.09] [N/A:N/A] % Reduction in Area: [5:99.10%] [N/A:N/A] % Reduction in Volume: 99.70% [N/A:N/A] Classification: [5:Full Thickness Without Exposed Support Structures] [N/A:N/A] Exudate Amount: [5:Medium] [N/A:N/A] Exudate Type: [5:Serosanguineous] [N/A:N/A] Exudate Color: [5:red, brown] [N/A:N/A] Wound Margin: [5:Distinct, outline attached]  [N/A:N/A] Granulation Amount: [5:Large (67-100%)] [N/A:N/A] Granulation Quality: [5:Pink, Pale] [N/A:N/A] Necrotic Amount: [5:None Present (0%)] [N/A:N/A] Exposed Structures: [5:Fascia: No Fat: No Tendon: No Muscle: No] [N/A:N/A] Joint: No Bone: No Limited to Skin Breakdown Epithelialization: Medium (34-66%) N/A N/A Periwound Skin Texture: Edema: No N/A N/A Excoriation: No Induration: No Callus: No Crepitus: No Fluctuance: No Friable: No Rash: No Scarring: No Periwound Skin Moist: Yes N/A N/A Moisture: Maceration: No Dry/Scaly: No Periwound Skin Color: Atrophie Blanche: No N/A N/A Cyanosis: No Ecchymosis: No Erythema: No Hemosiderin Staining: No Mottled: No Pallor: No Rubor: No Temperature: No Abnormality N/A N/A Tenderness on No N/A N/A Palpation: Wound Preparation: Ulcer Cleansing: Other: N/A N/A soap and water Topical Anesthetic Applied: None Treatment Notes  Electronic Signature(s) Signed: 09/26/2015 5:19:20 PM By: Curtis Sites Entered By: Curtis Sites on 09/26/2015 14:58:02 Jullien, Granquist Chrissie Noa ([id number]) -------------------------------------------------------------------------------- Multi-Disciplinary Care Plan Details Patient Name: [name] Date of Service: 09/26/2015 12:45 PM Medical Record Number: [id number] Patient Account Number: [id number] Date of Birth/Sex: [birth year] (80 y.o. Male) Treating RN: Curtis Sites Primary Care Physician: Dorothey Baseman Other Clinician: Referring Physician: Dorothey Baseman Treating Physician/Extender: Rudene Re in Treatment: 70 Active Inactive Orientation to the Wound Care Program Nursing Diagnoses: Knowledge deficit related to the wound healing center program Goals: Patient/caregiver will verbalize understanding of the Wound Healing Center Program Date Initiated: 01/15/2015 Goal Status: Active Interventions: Provide education on orientation to the wound center Notes: Venous Leg  Ulcer Nursing Diagnoses: Knowledge deficit related to disease process and management Potential for venous Insuffiency (use before diagnosis confirmed) Goals: Patient will maintain optimal edema control Date Initiated: 01/15/2015 Goal Status: Active Patient/caregiver will verbalize understanding of disease process and disease management Date Initiated: 01/15/2015 Goal Status: Active Verify adequate tissue perfusion prior to therapeutic compression application Date Initiated: 01/15/2015 Goal Status: Active Interventions: Assess peripheral edema status every visit. Compression as ordered Provide education on venous insufficiency FRANCISZEK, PLATTEN ([id number]) Treatment Activities: Test ordered outside of clinic : 09/26/2015 Therapeutic compression applied : 09/26/2015 Venous Duplex Doppler : 09/26/2015 Notes: Wound/Skin Impairment Nursing Diagnoses: Impaired tissue integrity Knowledge deficit related to ulceration/compromised skin integrity Goals: Patient/caregiver will verbalize understanding of skin care regimen Date Initiated: 01/15/2015 Goal Status: Active Ulcer/skin breakdown will have a volume reduction of 30% by week 4 Date Initiated: 01/15/2015 Goal Status: Active Ulcer/skin breakdown will have a volume reduction of 50% by week 8 Date Initiated: 01/15/2015 Goal Status: Active Ulcer/skin breakdown will have a volume reduction of 80% by week 12 Date Initiated: 01/15/2015 Goal Status: Active Ulcer/skin breakdown will heal within 14 weeks Date Initiated: 01/15/2015 Goal Status: Active Interventions: Assess patient/caregiver ability to perform ulcer/skin care regimen upon admission and as needed Assess ulceration(s) every visit Provide education on ulcer and skin care Notes: Electronic Signature(s) Signed: 09/26/2015 5:19:20 PM By: Curtis Sites Entered By: Curtis Sites on 09/26/2015 13:56:22 [name]  ([id number]) -------------------------------------------------------------------------------- Patient/Caregiver Education Details Patient Name: [name] Date of Service: 09/26/2015 12:45 PM Medical Record Number: [id number] Patient Account Number: [id number] Date of Birth/Gender: 11-27-1935 (80 y.o. Male) Treating RN: Curtis Sites Primary Care Physician: Dorothey Baseman Other Clinician: Referring Physician: Dorothey Baseman Treating Physician/Extender: Rudene Re in Treatment: 53 Education Assessment Education Provided To: Patient Education Topics Provided Venous: Handouts: Other: leg elevation Methods: Demonstration, Explain/Verbal Responses: State content correctly Electronic Signature(s) Signed: 09/26/2015 5:19:20 PM By: Curtis Sites Entered By: Curtis Sites on 09/26/2015 15:00:24 [name] ([id number]) -------------------------------------------------------------------------------- Wound Assessment Details Patient Name: [name] Date of Service: 09/26/2015 12:45 PM Medical Record Number: [id number] Patient Account Number: [id number] Date of Birth/Sex: [birth year] (80 y.o. Male) Treating RN: Curtis Sites Primary Care Physician: Dorothey Baseman Other Clinician: Referring Physician: Terance Hart, DAVID Treating Physician/Extender: Rudene Re in Treatment: 36 Wound Status Wound Number: 5 Primary Venous Leg Ulcer Etiology: Wound Location: Right Lower Leg - Medial Wound Status: Open Wounding Event: Gradually Appeared Comorbid Cataracts, Anemia, Hypertension, Date Acquired: 11/13/2014 History: Osteoarthritis Weeks Of Treatment: 36 Clustered Wound: No Photos Photo Uploaded By: Curtis Sites on 09/26/2015 17:10:46 Wound Measurements Length: (cm) 2.3 Width: (cm) 0.5 Depth: (cm) 0.1 Area: (cm) 0.903 Volume: (cm) 0.09 % Reduction in Area: 99.1% % Reduction in Volume: 99.7% Epithelialization: Medium (34-66%) Wound  Description Full Thickness Without  Exposed Classification: Support Structures Wound Margin: Distinct, outline attached Exudate Medium Amount: Exudate Type: Serosanguineous Exudate Color: red, brown Foul Odor After Cleansing: No Wound Bed Granulation Amount: Large (67-100%) Exposed Structure Granulation Quality: Pink, Pale Fascia Exposed: No Necrotic Amount: None Present (0%) Fat Layer Exposed: No RAHSEAN, WOOL ([id number]) Tendon Exposed: No Muscle Exposed: No Joint Exposed: No Bone Exposed: No Limited to Skin Breakdown Periwound Skin Texture Texture Color No Abnormalities Noted: No No Abnormalities Noted: No Callus: No Atrophie Blanche: No Crepitus: No Cyanosis: No Excoriation: No Ecchymosis: No Fluctuance: No Erythema: No Friable: No Hemosiderin Staining: No Induration: No Mottled: No Localized Edema: No Pallor: No Rash: No Rubor: No Scarring: No Temperature / Pain Moisture Temperature: No Abnormality No Abnormalities Noted: No Dry / Scaly: No Maceration: No Moist: Yes Wound Preparation Ulcer Cleansing: Other: soap and water, Topical Anesthetic Applied: None Treatment Notes Wound #5 (Right, Medial Lower Leg) 1. Cleansed with: Clean wound with Normal Saline 2. Anesthetic Topical Lidocaine 4% cream to wound bed prior to debridement 4. Dressing Applied: Other dressing (specify in notes) 5. Secondary Dressing Applied ABD Pad 7. Secured with Tape 4-Layer Compression System - Right Lower Extremity Notes collagen dressing applied by Dr. Meyer Russel today carboflex Electronic Signature(s) BRIANA, WINGERT ([id number]) Signed: 09/26/2015 5:19:20 PM By: Curtis Sites Entered By: Curtis Sites on 09/26/2015 13:57:10 Carnel, Tornes Chrissie Noa ([id number]) -------------------------------------------------------------------------------- Vitals Details Patient Name: JADYNN, HURDLE. Date of Service: 09/26/2015 12:45 PM Medical Record Number:  [id number] Patient Account Number: [id number] Date of Birth/Sex: 15-Apr-1936 (80 y.o. Male) Treating RN: Curtis Sites Primary Care Physician: Dorothey Baseman Other Clinician: Referring Physician: Terance Hart, DAVID Treating Physician/Extender: Rudene Re in Treatment: 36 Vital Signs Time Taken: 13:04 Temperature (F): 98.2 Height (in): 68 Pulse (bpm): 75 Respiratory Rate (breaths/min): 20 Blood Pressure (mmHg): 107/51 Reference Range: 80 - 120 mg / dl Electronic Signature(s) Signed: 09/26/2015 5:19:20 PM By: Curtis Sites Entered By: Curtis Sites on 09/26/2015 13:06:18

## 2015-10-03 ENCOUNTER — Encounter: Payer: Medicare Other | Admitting: Surgery

## 2015-10-03 DIAGNOSIS — I87311 Chronic venous hypertension (idiopathic) with ulcer of right lower extremity: Secondary | ICD-10-CM | POA: Diagnosis not present

## 2015-10-04 NOTE — Progress Notes (Addendum)
[name] ([id number]) Visit Report for 10/03/2015 Arrival Information Details Patient Name: [name], [name] Date of Service: 10/03/2015 3:30 PM Medical Record Number: [id number] Patient Account Number: [id number] Date of Birth/Sex: 1936/01/27 (80 y.o. Male) Treating RN: Curtis Sites Primary Care Physician: Dorothey Baseman Other Clinician: Referring Physician: Terance Hart, DAVID Treating Physician/Extender: Rudene Re in Treatment: 37 Visit Information History Since Last Visit Added or deleted any medications: No Patient Arrived: Wheel Chair Any new allergies or adverse reactions: No Arrival Time: 15:31 Had a fall or experienced change in No activities of daily living that may affect Accompanied By: self risk of falls: Transfer Assistance: None Signs or symptoms of abuse/neglect since last No Patient Identification Verified: Yes visito Secondary Verification Process Yes Hospitalized since last visit: No Completed: Pain Present Now: No Patient Requires Transmission-Based No Precautions: Patient Has Alerts: No Electronic Signature(s) Signed: 10/03/2015 5:06:11 PM By: Curtis Sites Entered By: Curtis Sites on 10/03/2015 15:32:20 [name] ([id number]) -------------------------------------------------------------------------------- Encounter Discharge Information Details Patient Name: [name] Date of Service: 10/03/2015 3:30 PM Medical Record Number: [id number] Patient Account Number: [id number] Date of Birth/Sex: [birth year] (80 y.o. Male) Treating RN: Curtis Sites Primary Care Physician: Dorothey Baseman Other Clinician: Referring Physician: Dorothey Baseman Treating Physician/Extender: Rudene Re in Treatment: 37 Encounter Discharge Information Items Discharge Pain Level: 0 Discharge Condition: Stable Ambulatory Status: Wheelchair Discharge Destination: Nursing Home Transportation: Private Auto Accompanied By:  self Schedule Follow-up Appointment: Yes Medication Reconciliation completed and provided to Patient/Care No Anni Hocevar: Provided on Clinical Summary of Care: 10/03/2015 Form Type Recipient Paper Patient [facility] Electronic Signature(s) Signed: 10/03/2015 4:46:19 PM By: Curtis Sites Previous Signature: 10/03/2015 4:11:39 PM Version By: Gwenlyn Perking Entered By: Curtis Sites on 10/03/2015 16:46:19 Earmon, Sherrow Chrissie Noa ([id number]) -------------------------------------------------------------------------------- Lower Extremity Assessment Details Patient Name: [name] Date of Service: 10/03/2015 3:30 PM Medical Record Number: [id number] Patient Account Number: [id number] Date of Birth/Sex: 1935/08/31 (80 y.o. Male) Treating RN: Curtis Sites Primary Care Physician: Terance Hart, DAVID Other Clinician: Referring Physician: Terance Hart, DAVID Treating Physician/Extender: Rudene Re in Treatment: 37 Edema Assessment Assessed: [Left: No] Franne Forts: No] Edema: [Left: Ye] [Right: s] Calf Left: Right: Point of Measurement: 40 cm From Medial Instep cm 38 cm Ankle Left: Right: Point of Measurement: 12 cm From Medial Instep cm 27.5 cm Vascular Assessment Pulses: Posterior Tibial Dorsalis Pedis Palpable: [Right:Yes] Extremity colors, hair growth, and conditions: Extremity Color: [Right:Hyperpigmented] Hair Growth on Extremity: [Right:No] Temperature of Extremity: [Right:Warm] Capillary Refill: [Right:< 3 seconds] Electronic Signature(s) Signed: 10/03/2015 4:44:27 PM By: Curtis Sites Entered By: Curtis Sites on 10/03/2015 16:44:27 [name] ([id number]) -------------------------------------------------------------------------------- Multi Wound Chart Details Patient Name: [name] Date of Service: 10/03/2015 3:30 PM Medical Record Number: [id number] Patient Account Number: [id number] Date of Birth/Sex: 1936-05-25 (80 y.o. Male) Treating RN: Curtis Sites Primary Care Physician: Dorothey Baseman Other Clinician: Referring Physician: Terance Hart, DAVID Treating Physician/Extender: Rudene Re in Treatment: 37 Vital Signs Height(in): 68 Pulse(bpm): 69 Weight(lbs): Blood Pressure 125/51 (mmHg): Body Mass Index(BMI): Temperature(F): 98.3 Respiratory Rate 20 (breaths/min): Photos: [5:No Photos] [N/A:N/A] Wound Location: [5:Right Lower Leg - Medial] [N/A:N/A] Wounding Event: [5:Gradually Appeared] [N/A:N/A] Primary Etiology: [5:Venous Leg Ulcer] [N/A:N/A] Comorbid History: [5:Cataracts, Anemia, Hypertension, Osteoarthritis] [N/A:N/A] Date Acquired: [5:11/13/2014] [N/A:N/A] Weeks of Treatment: [5:37] [N/A:N/A] Wound Status: [5:Open] [N/A:N/A] Measurements L x W x D 1.9x0.5x0.1 [N/A:N/A] (cm) Area (cm) : [5:0.746] [N/A:N/A] Volume (cm) : [5:0.075] [N/A:N/A] % Reduction in Area: [5:99.30%] [N/A:N/A] % Reduction in Volume: 99.80% [N/A:N/A] Classification: [5:Full Thickness Without Exposed  Support Structures] [N/A:N/A] Exudate Amount: [5:Medium] [N/A:N/A] Exudate Type: [5:Serosanguineous] [N/A:N/A] Exudate Color: [5:red, brown] [N/A:N/A] Wound Margin: [5:Distinct, outline attached] [N/A:N/A] Granulation Amount: [5:Large (67-100%)] [N/A:N/A] Granulation Quality: [5:Pink, Pale] [N/A:N/A] Necrotic Amount: [5:None Present (0%)] [N/A:N/A] Exposed Structures: [5:Fascia: No Fat: No Tendon: No Muscle: No] [N/A:N/A] Joint: No Bone: No Limited to Skin Breakdown Epithelialization: Medium (34-66%) N/A N/A Periwound Skin Texture: Edema: No N/A N/A Excoriation: No Induration: No Callus: No Crepitus: No Fluctuance: No Friable: No Rash: No Scarring: No Periwound Skin Moist: Yes N/A N/A Moisture: Maceration: No Dry/Scaly: No Periwound Skin Color: Atrophie Blanche: No N/A N/A Cyanosis: No Ecchymosis: No Erythema: No Hemosiderin Staining: No Mottled: No Pallor: No Rubor: No Temperature: No Abnormality N/A  N/A Tenderness on No N/A N/A Palpation: Wound Preparation: Ulcer Cleansing: Other: N/A N/A soap and water Topical Anesthetic Applied: None Treatment Notes Electronic Signature(s) Signed: 10/03/2015 5:06:11 PM By: Curtis Sites Entered By: Curtis Sites on 10/03/2015 15:51:15 [name] ([id number]) -------------------------------------------------------------------------------- Multi-Disciplinary Care Plan Details Patient Name: KEMONTAE, DUNKLEE. Date of Service: 10/03/2015 3:30 PM Medical Record Number: [id number] Patient Account Number: [id number] Date of Birth/Sex: Mar 25, 1936 (80 y.o. Male) Treating RN: Curtis Sites Primary Care Physician: Dorothey Baseman Other Clinician: Referring Physician: Dorothey Baseman Treating Physician/Extender: Rudene Re in Treatment: 83 Active Inactive Orientation to the Wound Care Program Nursing Diagnoses: Knowledge deficit related to the wound healing center program Goals: Patient/caregiver will verbalize understanding of the Wound Healing Center Program Date Initiated: 01/15/2015 Goal Status: Active Interventions: Provide education on orientation to the wound center Notes: Venous Leg Ulcer Nursing Diagnoses: Knowledge deficit related to disease process and management Potential for venous Insuffiency (use before diagnosis confirmed) Goals: Patient will maintain optimal edema control Date Initiated: 01/15/2015 Goal Status: Active Patient/caregiver will verbalize understanding of disease process and disease management Date Initiated: 01/15/2015 Goal Status: Active Verify adequate tissue perfusion prior to therapeutic compression application Date Initiated: 01/15/2015 Goal Status: Active Interventions: Assess peripheral edema status every visit. Compression as ordered Provide education on venous insufficiency LATRAVIOUS, LEVITT ([id number]) Treatment Activities: Test ordered outside of clinic : 10/03/2015 Therapeutic  compression applied : 10/03/2015 Venous Duplex Doppler : 10/03/2015 Notes: Wound/Skin Impairment Nursing Diagnoses: Impaired tissue integrity Knowledge deficit related to ulceration/compromised skin integrity Goals: Patient/caregiver will verbalize understanding of skin care regimen Date Initiated: 01/15/2015 Goal Status: Active Ulcer/skin breakdown will have a volume reduction of 30% by week 4 Date Initiated: 01/15/2015 Goal Status: Active Ulcer/skin breakdown will have a volume reduction of 50% by week 8 Date Initiated: 01/15/2015 Goal Status: Active Ulcer/skin breakdown will have a volume reduction of 80% by week 12 Date Initiated: 01/15/2015 Goal Status: Active Ulcer/skin breakdown will heal within 14 weeks Date Initiated: 01/15/2015 Goal Status: Active Interventions: Assess patient/caregiver ability to perform ulcer/skin care regimen upon admission and as needed Assess ulceration(s) every visit Provide education on ulcer and skin care Notes: Electronic Signature(s) Signed: 10/03/2015 5:06:11 PM By: Curtis Sites Entered By: Curtis Sites on 10/03/2015 15:51:06 [name] ([id number]) -------------------------------------------------------------------------------- Patient/Caregiver Education Details Patient Name: [name] Date of Service: 10/03/2015 3:30 PM Medical Record Number: [id number] Patient Account Number: [id number] Date of Birth/Gender: 1936/02/17 (80 y.o. Male) Treating RN: Curtis Sites Primary Care Physician: Dorothey Baseman Other Clinician: Referring Physician: Dorothey Baseman Treating Physician/Extender: Rudene Re in Treatment: 33 Education Assessment Education Provided To: Patient Education Topics Provided Venous: Handouts: Other: leg elevation for edema management Methods: Explain/Verbal Responses: State content correctly Electronic Signature(s) Signed: 10/03/2015 4:46:41 PM By: Curtis Sites Entered  By: Curtis Sites  on 10/03/2015 16:46:41 [name] ([id number]) -------------------------------------------------------------------------------- Wound Assessment Details Patient Name: SOHUM, DELILLO. Date of Service: 10/03/2015 3:30 PM Medical Record Number: [id number] Patient Account Number: [id number] Date of Birth/Sex: [birth year] (80 y.o. Male) Treating RN: Curtis Sites Primary Care Physician: Dorothey Baseman Other Clinician: Referring Physician: Terance Hart, DAVID Treating Physician/Extender: Rudene Re in Treatment: 37 Wound Status Wound Number: 5 Primary Venous Leg Ulcer Etiology: Wound Location: Right Lower Leg - Medial Wound Status: Open Wounding Event: Gradually Appeared Comorbid Cataracts, Anemia, Hypertension, Date Acquired: 11/13/2014 History: Osteoarthritis Weeks Of Treatment: 37 Clustered Wound: No Photos Photo Uploaded By: Curtis Sites on 10/03/2015 16:48:52 Wound Measurements Length: (cm) 1.9 Width: (cm) 0.5 Depth: (cm) 0.1 Area: (cm) 0.746 Volume: (cm) 0.075 % Reduction in Area: 99.3% % Reduction in Volume: 99.8% Epithelialization: Medium (34-66%) Tunneling: No Undermining: No Wound Description Full Thickness Without Exposed Classification: Support Structures Wound Margin: Distinct, outline attached Exudate Medium Amount: Exudate Type: Serosanguineous Exudate Color: red, brown Foul Odor After Cleansing: No Wound Bed Granulation Amount: Large (67-100%) Exposed Structure Granulation Quality: Pink, Pale Fascia Exposed: No Necrotic Amount: None Present (0%) Fat Layer Exposed: No MOHSIN, CRUM ([id number]) Tendon Exposed: No Muscle Exposed: No Joint Exposed: No Bone Exposed: No Limited to Skin Breakdown Periwound Skin Texture Texture Color No Abnormalities Noted: No No Abnormalities Noted: No Callus: No Atrophie Blanche: No Crepitus: No Cyanosis: No Excoriation: No Ecchymosis: No Fluctuance: No Erythema: No Friable:  No Hemosiderin Staining: No Induration: No Mottled: No Localized Edema: No Pallor: No Rash: No Rubor: No Scarring: No Temperature / Pain Moisture Temperature: No Abnormality No Abnormalities Noted: No Dry / Scaly: No Maceration: No Moist: Yes Wound Preparation Ulcer Cleansing: Other: soap and water, Topical Anesthetic Applied: None Treatment Notes Wound #5 (Right, Medial Lower Leg) 1. Cleansed with: Cleanse wound with antibacterial soap and water 3. Peri-wound Care: Skin Prep 4. Dressing Applied: Mepitel Other dressing (specify in notes) 5. Secondary Dressing Applied ABD Pad 7. Secured with 4-Layer Compression System - Right Lower Extremity Notes collagen dressing applied by Dr. Meyer Russel today Electronic Signature(s) Signed: 10/03/2015 5:06:11 PM By: Ebbie Ridge ([id number]) Entered By: Curtis Sites on 10/03/2015 15:48:46 ORPHEUS, HAYHURST ([id number]) -------------------------------------------------------------------------------- Vitals Details Patient Name: LONDON, TARNOWSKI. Date of Service: 10/03/2015 3:30 PM Medical Record Number: [id number] Patient Account Number: [id number] Date of Birth/Sex: 1936/05/26 (80 y.o. Male) Treating RN: Curtis Sites Primary Care Physician: Dorothey Baseman Other Clinician: Referring Physician: Terance Hart, DAVID Treating Physician/Extender: Rudene Re in Treatment: 37 Vital Signs Time Taken: 15:33 Temperature (F): 98.3 Height (in): 68 Pulse (bpm): 69 Respiratory Rate (breaths/min): 20 Blood Pressure (mmHg): 125/51 Reference Range: 80 - 120 mg / dl Electronic Signature(s) Signed: 10/03/2015 5:06:11 PM By: Curtis Sites Entered By: Curtis Sites on 10/03/2015 15:34:49

## 2015-10-04 NOTE — Progress Notes (Addendum)
CLOUD, [name] ([id number]) Visit Report for 10/03/2015 Chief Complaint Document Details Patient Name: [name], [name] Date of Service: 10/03/2015 3:30 PM Medical Record Number: [id number] Patient Account Number: [id number] Date of Birth/Sex: Feb 04, 1936 (80 y.o. Male) Treating RN: Curtis Sites Primary Care Physician: Dorothey Baseman Other Clinician: Referring Physician: Dorothey Baseman Treating Physician/Extender: Rudene Re in Treatment: 37 Information Obtained from: Patient Chief Complaint Patient presents for treatment of an open ulcer due to venous insufficiency. The patient has had a open wound on his right medial ankle for at least 3 years and was last seen in the wound clinic in February 2015. He was lost to follow-up after that. Electronic Signature(s) Signed: 10/03/2015 3:20:36 PM By: Evlyn Kanner MD, FACS Entered By: Evlyn Kanner on 10/03/2015 15:20:35 [name], [name] ([id number]) -------------------------------------------------------------------------------- HPI Details Patient Name: [name], [name] Date of Service: 10/03/2015 3:30 PM Medical Record Number: [id number] Patient Account Number: [id number] Date of Birth/Sex: 1935-11-09 (80 y.o. Male) Treating RN: Curtis Sites Primary Care Physician: Dorothey Baseman Other Clinician: Referring Physician: Dorothey Baseman Treating Physician/Extender: Rudene Re in Treatment: 37 History of Present Illness Location: right lower extremity ulceration Quality: Patient reports experiencing a dull pain to affected area(s). Severity: Patient states wound are getting worse. 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 Modifying Factors: Consults to this date include:has received treatment in the wound center in the past but stopped coming here since February 2015 Associated Signs and Symptoms: Patient reports having difficulty standing for long periods. HPI Description: 80 year old male who is known to have progressive weakness and has been in a nursing home for a while has had chronic lower extremity edema both legs and a large open wound on the right lower extremity which is has at least for about 3-4 years. The patient was seen in the wound clinic before and has been treated until February 2015 when he was lost to follow-up. Past medical history is significant for hypertension, gout, venous stasis ulcers, Parkinson's [name], constipation. He's not been a diabetic but his last hemoglobin A1c was 6 in March 2015. There is documentation that he's had endovenous ablation of bilateral varicose veins but we have no documentation about this and this may be done at least 4-5 years ago. 01/22/2015 -- he has his vascular test is scheduled for this afternoon. 01/29/2015 -- the patient's vascular test was canceled last week because he was unable to get onto the examining bed at [facility]. His Unna's boot was not applied either and the patient had a dressing placed by home health. Today when his dressing was removed we found a lot of maggots in his right lower extremity. 02/07/2015 -- Was seen in the vascular office by the PA and she noted that a bilateral venous duplex study did not show any DVT, SVT or reflux bilaterally. The patient was advised to continue with Unna's boot and would at some stage to require graduated compression stockings of the 20-30 mmHg variety. In the future lymphedema pumps would also be considered. 02/14/2015 -- his dressing is smelling quite a bit and there is a discoloration of the wound suggestive of an infection. Deep tissue cultures will be taken today. 02/21/2015 --  the culture  is back and it has growth moderate growth of Proteus and gram-negative rods sensitive to sulfa, ampicillin, cephazolin, Zosyn. He is already on doxycycline and his wound is looking clean and hence we will continue with this. 02/28/2015 -- he's been doing fine still on his antibiotics and has no fresh issues. 03/07/2015 -- I was told that because he lives in a nursing home the Apligraf was denied by his insurance company. 03/14/2015 -- we are awaiting samples and a trial of Puraply to be tried for his ulceration. 03/21/2015 -- his Apligraf was approved and he is here for his first application of Apligraf 03/28/2015 -- his wound has had quite a bit of secretion and needs to be changed and hence I will use the YAW, ESCOTO. ([id number]) second application of Apligraf. 04/04/2015 -- he is here for a wound check but has a lot of secretions and hence his dressing needs to be taken down. 04/09/2015 -- he is here for his third application of Apligraf 04/18/2015 -- he is here for a wound check and though he does not have any overt infection he always has a foul odor to his leg. 04/25/2015 -- his wound has been doing much better and he has minimal drainage and the size is smaller. Of note he is on doxycycline. 05/02/2015 -- he is here for his fourth application of Apligraf 05/09/2015 -- he is here for a wound check and is bothered by a lot of odor from the wound. 05/16/2015 -- he has done very well, the odor and drainage is less with the wound dressing having been changed twice this week. He is here for his last application of Apligraf. 06/20/2015 -- an area superior to the previous wound has now opened up and this is a fairly superficial ulceration. 07/12/2015 -- he is here for his first application of Puraply. This is a sample and this is no charge. 07/18/2015 -- he is here for his second application of Puraply. This is a sample and this is no charge. 07/25/2015 -- he is here for his third  application of Puraply. This is a sample and this is no charge. 08/01/2015 -- he is here for his fourth application of Puraply. This is a sample and this is no charge. 08/08/2015 -- he is here for his fifth application of Puraply and this is a sample at no charge. 08/15/2015 -- he is here for his sixth application of Puraply and this is a sample at no charge. 08/29/2015 -- he is here for his eight application of Puraply and this is a sample at no charge. 09/05/2015 -- he is here for his nineth application of Puraply and this is a sample at no charge. 09/19/2015 -- this is the second week of using the collagen substitute and he is doing very well. 09/26/2015 -- this is the third week of using the collagen substitute. 10/03/2015 -- this is the fourth week of using the collagen substitute. Electronic Signature(s) Signed: 10/03/2015 3:21:12 PM By: Evlyn Kanner MD, FACS Entered By: Evlyn Kanner on 10/03/2015 15:21:11 [name], [name] ([id number]) -------------------------------------------------------------------------------- Physical Exam Details Patient Name: [name], [name] Date of Service: 10/03/2015 3:30 PM Medical Record Number: [id number] Patient Account Number: [id number] Date of Birth/Sex: 07-01-1936 (80 y.o. Male) Treating RN: Curtis Sites Primary Care Physician: Dorothey Baseman Other Clinician: Referring Physician: Terance Hart, DAVID Treating Physician/Extender: Rudene Re in Treatment: 37 Constitutional . Pulse regular. Respirations normal and unlabored. Afebrile. . Eyes Nonicteric. Reactive to light. Ears, Nose,  Mouth, and Throat Lips, teeth, and gums WNL.Marland Kitchen Moist mucosa without lesions. Neck supple and nontender. No palpable supraclavicular or cervical adenopathy. Normal sized without goiter. Respiratory WNL. No retractions.. Cardiovascular Pedal Pulses WNL. No clubbing, cyanosis or edema. Chest Breasts symmetical and no nipple discharge.. Breast tissue WNL,  no masses, lumps, or tenderness.. Lymphatic No adneopathy. No adenopathy. No adenopathy. Musculoskeletal Adexa without tenderness or enlargement.. Digits and nails w/o clubbing, cyanosis, infection, petechiae, ischemia, or inflammatory conditions.. Integumentary (Hair, Skin) No suspicious lesions. No crepitus or fluctuance. No peri-wound warmth or erythema. No masses.Marland Kitchen Psychiatric Judgement and insight Intact.. No evidence of depression, anxiety, or agitation.. Notes the wound is looking clean small and has got healthy scar tissue Electronic Signature(s) Signed: 10/03/2015 3:57:58 PM By: Evlyn Kanner MD, FACS Previous Signature: 10/03/2015 3:21:52 PM Version By: Evlyn Kanner MD, FACS Entered By: Evlyn Kanner on 10/03/2015 15:57:58 [name], [name] ([id number]) -------------------------------------------------------------------------------- Physician Orders Details Patient Name: [name], [name] Date of Service: 10/03/2015 3:30 PM Medical Record Number: [id number] Patient Account Number: [id number] Date of Birth/Sex: 02-Feb-1936 (80 y.o. 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 Skin Barriers/Peri-Wound Care Wound #5 Right,Medial Lower Leg o Moisturizing lotion Primary Wound Dressing Wound #5 Right,Medial Lower Leg o Other: -  collagen dressing applied by Dr Meyer Russel today in clinic Secondary Dressing Wound #5 Right,Medial Lower Leg o ABD pad - Carboflex and ABD; gauze to bolster o Mepitel One - over collagen Dressing Change Frequency Wound #5 Right,Medial Lower Leg o Change dressing every week Follow-up Appointments Wound #5 Right,Medial Lower Leg o Return Appointment in 1 week. Edema Control [name], [name] ([id number]) Wound #5 Right,Medial Lower Leg o 4-Layer Compression System - Right Lower Extremity Electronic Signature(s) Signed: 10/03/2015 4:09:37 PM By: Evlyn Kanner MD, FACS Signed: 10/03/2015 5:06:11 PM By: Curtis Sites Entered By: Curtis Sites on 10/03/2015 15:52:03 [name], [name] ([id number]) -------------------------------------------------------------------------------- Problem List Details Patient Name: [name], [name] Date of Service: 10/03/2015 3:30 PM Medical Record Number: [id number] Patient Account Number: [id number] Date of Birth/Sex: [birth year] (80 y.o. 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 Date of Service: 10/03/2015 3:30 PM Medical Record Number: [id number] Patient Account Number: [id number] Date of Birth/Sex: Nov 04, 1935 (80 y.o. Male) Treating RN: Curtis Sites Primary Care Physician: Dorothey Baseman Other Clinician: Referring Physician: Dorothey Baseman Treating Physician/Extender: Rudene Re in Treatment: 37 Subjective Chief Complaint Information obtained  from Patient Patient presents for treatment of an open ulcer due to venous insufficiency. The patient has had a open wound on his right medial ankle for at least 3 years and was last seen in the wound clinic in February 2015. He was lost to follow-up after that. History of Present Illness (HPI) The following HPI elements were documented for the patient's wound: Location: right lower extremity ulceration Quality: Patient reports experiencing a dull pain to affected area(s). Severity: Patient states wound are getting worse. 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 Modifying Factors: Consults to this date include:has received treatment in the wound center in the past but stopped coming here since February 2015 Associated Signs and Symptoms: Patient reports having difficulty standing for long periods. 80 year old male who is known to have progressive weakness and has been in a nursing home for a while has had chronic lower extremity edema both legs and a large open wound on the right lower extremity which is has at least for about 3-4 years. The patient was seen in the wound clinic before and has been treated until February 2015 when he was lost to follow-up. Past medical history is significant for hypertension, gout,  venous stasis ulcers, Parkinson's [name], constipation. He's not been a diabetic but his last hemoglobin A1c was 6 in March 2015. There is documentation that he's had endovenous ablation of bilateral varicose veins but we have no documentation about this and this may be done at least 4-5 years ago. 01/22/2015 -- he has his vascular test is scheduled for this afternoon. 01/29/2015 -- the patient's vascular test was canceled last week because he was unable to get onto the examining bed at [facility]. His Unna's boot was not applied either and the patient had a dressing placed by home health. Today when his dressing was removed we found a lot of maggots in his right lower extremity. 02/07/2015 -- Was seen in the vascular office by the PA and she noted that a bilateral venous duplex study did not show any DVT, SVT or reflux bilaterally. The patient was advised to continue with Unna's boot and would at some stage to require graduated compression stockings of the 20-30 mmHg variety. In the future lymphedema pumps would also be considered. 02/14/2015 -- his dressing is smelling quite a bit and there is a discoloration of the wound suggestive of an JASHAN, COTTEN. ([id number]) infection. Deep tissue cultures will be taken today. 02/21/2015 -- the culture is back and it has growth moderate growth of Proteus and gram-negative rods sensitive to sulfa, ampicillin, cephazolin, Zosyn. He is already on doxycycline and his wound is looking clean and hence we will continue with this. 02/28/2015 -- he's been doing fine still on his antibiotics and has no fresh issues. 03/07/2015 -- I was told that because he lives in a nursing home the Apligraf was denied by his insurance company. 03/14/2015 -- we are awaiting samples and a trial of Puraply to be tried for his ulceration. 03/21/2015 -- his Apligraf was approved and he is here for his first application of Apligraf 03/28/2015 -- his wound has had quite a bit  of secretion and needs to be changed  and hence I will use the second application of Apligraf. 04/04/2015 -- he is here for a wound check but has a lot of secretions and hence his dressing needs to be taken down. 04/09/2015 -- he is here for his third application of Apligraf 04/18/2015 -- he is here for a wound check and though he does not have any overt infection he always has a foul odor to his leg. 04/25/2015 -- his wound has been doing much better and he has minimal drainage and the size is smaller. Of note he is on doxycycline. 05/02/2015 -- he is here for his fourth application of Apligraf 05/09/2015 -- he is here for a wound check and is bothered by a lot of odor from the wound. 05/16/2015 -- he has done very well, the odor and drainage is less with the wound dressing having been changed twice this week. He is here for his last application of Apligraf. 06/20/2015 -- an area superior to the previous wound has now opened up and this is a fairly superficial ulceration. 07/12/2015 -- he is here for his first application of Puraply. This is a sample and this is no charge. 07/18/2015 -- he is here for his second application of Puraply. This is a sample and this is no charge. 07/25/2015 -- he is here for his third application of Puraply. This is a sample and this is no charge. 08/01/2015 -- he is here for his fourth application of Puraply. This is a sample and this is no charge. 08/08/2015 -- he is here for his fifth application of Puraply and this is a sample at no charge. 08/15/2015 -- he is here for his sixth application of Puraply and this is a sample at no charge. 08/29/2015 -- he is here for his eight application of Puraply and this is a sample at no charge. 09/05/2015 -- he is here for his nineth application of Puraply and this is a sample at no charge. 09/19/2015 -- this is the second week of using the collagen substitute and he is doing very well. 09/26/2015 -- this is the third week  of using the collagen substitute. 10/03/2015 -- this is the fourth week of using the collagen substitute. Objective Constitutional Pulse regular. Respirations normal and unlabored. Afebrile. Vitals Time Taken: 3:33 PM, Height: 68 in, Temperature: 98.3 F, Pulse: 69 bpm, Respiratory Rate: 20 Batley, Elih P. ([id number]) breaths/min, Blood Pressure: 125/51 mmHg. Eyes Nonicteric. Reactive to light. Ears, Nose, Mouth, and Throat Lips, teeth, and gums WNL.Marland Kitchen Moist mucosa without lesions. Neck supple and nontender. No palpable supraclavicular or cervical adenopathy. Normal sized without goiter. Respiratory WNL. No retractions.. Cardiovascular Pedal Pulses WNL. No clubbing, cyanosis or edema. Chest Breasts symmetical and no nipple discharge.. Breast tissue WNL, no masses, lumps, or tenderness.. Lymphatic No adneopathy. No adenopathy. No adenopathy. Musculoskeletal Adexa without tenderness or enlargement.. Digits and nails w/o clubbing, cyanosis, infection, petechiae, ischemia, or inflammatory conditions.Marland Kitchen Psychiatric Judgement and insight Intact.. No evidence of depression, anxiety, or agitation.. General Notes: the wound is looking clean small and has got healthy scar tissue Integumentary (Hair, Skin) No suspicious lesions. No crepitus or fluctuance. No peri-wound warmth or erythema. No masses.. Wound #5 status is Open. Original cause of wound was Gradually Appeared. The wound is located on the Right,Medial Lower Leg. The wound measures 1.9cm length x 0.5cm width x 0.1cm depth; 0.746cm^2 area and 0.075cm^3 volume. The wound is limited to skin breakdown. There is no tunneling or undermining noted. There is a medium amount of  serosanguineous drainage noted. The wound margin is distinct with the outline attached to the wound base. There is large (67-100%) pink, pale granulation within the wound bed. There is no necrotic tissue within the wound bed. The periwound skin appearance exhibited:  Moist. The periwound skin appearance did not exhibit: Callus, Crepitus, Excoriation, Fluctuance, Friable, Induration, Localized Edema, Rash, Scarring, Dry/Scaly, Maceration, Atrophie Blanche, Cyanosis, Ecchymosis, Hemosiderin Staining, Mottled, Pallor, Rubor, Erythema. Periwound temperature was noted as No Abnormality. 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 Skin Barriers/Peri-Wound Care: Wound #5 Right,Medial Lower Leg: Moisturizing lotion Primary Wound Dressing: Wound #5 Right,Medial Lower Leg: Other: - collagen dressing applied by Dr Meyer Russel today in clinic Secondary Dressing: Wound #5 Right,Medial Lower Leg: ABD pad - Carboflex and ABD; gauze to bolster Mepitel One - over collagen Dressing Change Frequency: Wound #5 Right,Medial Lower Leg: Change dressing every week Follow-up Appointments: Wound #5 Right,Medial Lower Leg: Return Appointment in 1 week. Edema Control: Wound #5 Right,Medial Lower Leg: 4-Layer Compression System - Right Lower Extremity Overall there is good resolution of his wound size and I will use the remaining collagen we have with a 4-layer Profore wrap. TRAY, KLAYMAN ([id number]) In anticipation of his wound healing so and we will measure him up for some compression stockings both of the pull up and the Velcro variety Electronic Signature(s) Signed: 10/03/2015 3:59:08 PM By: Evlyn Kanner MD, FACS Entered By: Evlyn Kanner on 10/03/2015 15:59:08 JYLAN, LOEZA  ([id number]) -------------------------------------------------------------------------------- SuperBill Details Patient Name: [name]. Date of Service: 10/03/2015 Medical Record Number: [id number] Patient Account Number: [id number] Date of Birth/Sex: 1935-08-08 (80 y.o. 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 E66.01 Morbid (severe) obesity due to excess calories I89.0 Lymphedema, not elsewhere classified Facility Procedures CPT4: Description Modifier Quantity Code [id number] (Facility Use Only) [phone] - APPLY MULTLAY COMPRS LWR RT 1 LEG Physician Procedures CPT4 Code Description: [id number] - WC PHYS LEVEL 3 - EST PT ICD-10 Description Diagnosis I87.311 Chronic venous hypertension (idiopathic) with ulcer of 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 E66.01 Morbid (severe) obesity due  to excess calories I89.0 Lymphedema, not elsewhere classified Modifier: right lower s of muscle Quantity: 1 extremity Electronic Signature(s) Signed: 10/03/2015 4:44:50 PM By: Curtis Sites Previous Signature: 10/03/2015 3:59:25 PM Version By: Evlyn Kanner MD, FACS Entered By: Curtis Sites on 10/03/2015 16:44:50

## 2015-10-10 ENCOUNTER — Encounter: Payer: Medicare Other | Attending: Surgery | Admitting: Surgery

## 2015-10-10 DIAGNOSIS — I87311 Chronic venous hypertension (idiopathic) with ulcer of right lower extremity: Secondary | ICD-10-CM | POA: Diagnosis not present

## 2015-10-10 DIAGNOSIS — L97313 Non-pressure chronic ulcer of right ankle with necrosis of muscle: Secondary | ICD-10-CM | POA: Insufficient documentation

## 2015-10-10 DIAGNOSIS — I890 Lymphedema, not elsewhere classified: Secondary | ICD-10-CM | POA: Insufficient documentation

## 2015-10-10 DIAGNOSIS — I10 Essential (primary) hypertension: Secondary | ICD-10-CM | POA: Diagnosis not present

## 2015-10-10 DIAGNOSIS — G20 Parkinson's disease: Secondary | ICD-10-CM | POA: Diagnosis not present

## 2015-10-10 NOTE — Progress Notes (Addendum)
ASHTEN, PRATS ([id number]) Visit Report for 10/10/2015 Chief Complaint Document Details Patient Name: [name], [name] Date of Service: 10/10/2015 8:30 AM Medical Record Number: [id number] Patient Account Number: [id number] Date of Birth/Sex: [birth year] (80 y.o. Male) Treating RN: Curtis Sites Primary Care Physician: Dorothey Baseman Other Clinician: Referring Physician: Dorothey Baseman Treating Physician/Extender: Rudene Re in Treatment: 64 Information Obtained from: Patient Chief Complaint Patient presents for treatment of an open ulcer due to venous insufficiency. The patient has had a open wound on his right medial ankle for at least 3 years and was last seen in the wound clinic in February 2015. He was lost to follow-up after that. Electronic Signature(s) Signed: 10/10/2015 8:33:23 AM By: Evlyn Kanner MD, FACS Entered By: Evlyn Kanner on 10/10/2015 08:33:23 [name], [name] ([id number]) -------------------------------------------------------------------------------- HPI Details Patient Name: [name], [name] Date of Service: 10/10/2015 8:30 AM Medical Record Number: [id number] Patient Account Number: [id number] Date of Birth/Sex: 02-17-1936 (80 y.o. Male) Treating RN: Curtis Sites Primary Care Physician: Dorothey Baseman Other Clinician: Referring Physician: Dorothey Baseman Treating Physician/Extender: Rudene Re in Treatment: 57 History of Present Illness Location: right lower extremity ulceration Quality: Patient reports experiencing a dull pain to affected area(s). Severity: Patient states wound are getting worse. 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 Modifying Factors: Consults to this date include:has received treatment in the wound center in the past but stopped coming here since February 2015 Associated Signs and Symptoms: Patient reports having difficulty standing for long periods. HPI Description: 80 year old male who is known to have progressive weakness and has been in a nursing home for a while has had chronic lower extremity edema both legs and a large open wound on the right lower extremity which is has at least for about 3-4 years. The patient was seen in the wound clinic before and has been treated until February 2015 when he was lost to follow-up. Past medical history is significant for hypertension, gout, venous stasis ulcers, Parkinson's [name], constipation. He's not been a diabetic but his last hemoglobin A1c was 6 in March 2015. There is documentation that he's had endovenous ablation of bilateral varicose veins but we have no documentation about this and this may be done at least 4-5 years ago. 01/22/2015 -- he has his vascular test is scheduled for this afternoon. 01/29/2015 -- the patient's vascular test was canceled last week because he was unable to get onto the examining bed at [facility]. His Unna's boot was not applied either and the patient had a dressing placed by home health. Today when his dressing was removed we found a lot of maggots in his right lower extremity. 02/07/2015 -- Was seen in the vascular office by the PA and she noted that a bilateral venous duplex study did not show any DVT, SVT or reflux bilaterally. The patient was advised to continue with Unna's boot and would at some stage to require graduated compression stockings of the 20-30 mmHg variety. In the future lymphedema pumps would also be considered. 02/14/2015 -- his dressing is smelling quite a bit and there is a discoloration of the wound suggestive of an infection. Deep tissue cultures will be taken today. 02/21/2015 --  the culture  is back and it has growth moderate growth of Proteus and gram-negative rods sensitive to sulfa, ampicillin, cephazolin, Zosyn. He is already on doxycycline and his wound is looking clean and hence we will continue with this. 02/28/2015 -- he's been doing fine still on his antibiotics and has no fresh issues. 03/07/2015 -- I was told that because he lives in a nursing home the Apligraf was denied by his insurance company. 03/14/2015 -- we are awaiting samples and a trial of Puraply to be tried for his ulceration. 03/21/2015 -- his Apligraf was approved and he is here for his first application of Apligraf 03/28/2015 -- his wound has had quite a bit of secretion and needs to be changed and hence I will use the [name], [name] ([id number]) second application of Apligraf. 04/04/2015 -- he is here for a wound check but has a lot of secretions and hence his dressing needs to be taken down. 04/09/2015 -- he is here for his third application of Apligraf 04/18/2015 -- he is here for a wound check and though he does not have any overt infection he always has a foul odor to his leg. 04/25/2015 -- his wound has been doing much better and he has minimal drainage and the size is smaller. Of note he is on doxycycline. 05/02/2015 -- he is here for his fourth application of Apligraf 05/09/2015 -- he is here for a wound check and is bothered by a lot of odor from the wound. 05/16/2015 -- he has done very well, the odor and drainage is less with the wound dressing having been changed twice this week. He is here for his last application of Apligraf. 06/20/2015 -- an area superior to the previous wound has now opened up and this is a fairly superficial ulceration. 07/12/2015 -- he is here for his first application of Puraply. This is a sample and this is no charge. 07/18/2015 -- he is here for his second application of Puraply. This is a sample and this is no charge. 07/25/2015 -- he is here for his third  application of Puraply. This is a sample and this is no charge. 08/01/2015 -- he is here for his fourth application of Puraply. This is a sample and this is no charge. 08/08/2015 -- he is here for his fifth application of Puraply and this is a sample at no charge. 08/15/2015 -- he is here for his sixth application of Puraply and this is a sample at no charge. 08/29/2015 -- he is here for his eight application of Puraply and this is a sample at no charge. 09/05/2015 -- he is here for his nineth application of Puraply and this is a sample at no charge. 09/19/2015 -- this is the second week of using the collagen substitute and he is doing very well. 09/26/2015 -- this is the third week of using the collagen substitute. 10/03/2015 -- this is the fourth week of using the collagen substitute. Electronic Signature(s) Signed: 10/10/2015 8:33:29 AM By: Evlyn Kanner MD, FACS Entered By: Evlyn Kanner on 10/10/2015 08:33:29 [name], [name] ([id number]) -------------------------------------------------------------------------------- Physical Exam Details Patient Name: HAI, [name] Date of Service: 10/10/2015 8:30 AM Medical Record Number: [id number] Patient Account Number: [id number] Date of Birth/Sex: 10-13-1935 (80 y.o. Male) Treating RN: Curtis Sites Primary Care Physician: Dorothey Baseman Other Clinician: Referring Physician: Terance Hart, DAVID Treating Physician/Extender: Rudene Re in Treatment: 38 Constitutional . Pulse regular. Respirations normal and unlabored. Afebrile. . Eyes Nonicteric. Reactive to light. Ears, Nose,  Mouth, and Throat Lips, teeth, and gums WNL.Marland Kitchen Moist mucosa without lesions. Neck supple and nontender. No palpable supraclavicular or cervical adenopathy. Normal sized without goiter. Respiratory WNL. No retractions.. Cardiovascular Pedal Pulses WNL. No clubbing, cyanosis or edema. Lymphatic No adneopathy. No adenopathy. No  adenopathy. Musculoskeletal Adexa without tenderness or enlargement.. Digits and nails w/o clubbing, cyanosis, infection, petechiae, ischemia, or inflammatory conditions.. Integumentary (Hair, Skin) No suspicious lesions. No crepitus or fluctuance. No peri-wound warmth or erythema. No masses.Marland Kitchen Psychiatric Judgement and insight Intact.. No evidence of depression, anxiety, or agitation.. Notes overall there is a smaller wound which is very healthy and it is close to healing. Electronic Signature(s) Signed: 10/10/2015 8:53:55 AM By: Evlyn Kanner MD, FACS Entered By: Evlyn Kanner on 10/10/2015 08:53:54 [name], [name] ([id number]) -------------------------------------------------------------------------------- Physician Orders Details Patient Name: [name], [name] Date of Service: 10/10/2015 8:30 AM Medical Record Number: [id number] Patient Account Number: [id number] Date of Birth/Sex: [birth year] (80 y.o. 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 Skin Barriers/Peri-Wound Care Wound #5 Right,Medial Lower Leg o Moisturizing lotion Primary Wound Dressing Wound #5 Right,Medial Lower Leg o Other: - collagen dressing applied by Dr Meyer Russel today in clinic Secondary Dressing Wound #5 Right,Medial Lower Leg o ABD pad - Carboflex and ABD;  gauze to bolster o Mepitel One - over collagen Dressing Change Frequency Wound #5 Right,Medial Lower Leg o Change dressing every week Follow-up Appointments Wound #5 Right,Medial Lower Leg o Return Appointment in 1 week. Edema Control [name], [name] ([id number]) Wound #5 Right,Medial Lower Leg o 4-Layer Compression System - Right Lower Extremity o Other: - SNF to provide patient with bilateral 20-33mmHg compression hose to be worn daily. Start left leg now and right leg once wound heals. This is to be worn EVERY day so multiple pairs should be provided. Electronic Signature(s) Signed: 10/10/2015 4:29:21 PM By: Evlyn Kanner MD, FACS Signed: 10/10/2015 5:50:07 PM By: Curtis Sites Entered By: Curtis Sites on 10/10/2015 08:36:59 [name], [name] ([id number]) -------------------------------------------------------------------------------- Problem List Details Patient Name: GILFORD, LARDIZABAL. Date of Service: 10/10/2015 8:30 AM Medical Record Number: [id number] Patient Account Number: [id number] Date of Birth/Sex: [birth year] (80 y.o. 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 Date of Service: 10/10/2015 8:30 AM Medical Record Number: [id number] Patient Account Number: [id number] Date of Birth/Sex: Dec 23, 1935 (80 y.o. Male) Treating RN: Curtis Sites Primary Care Physician: Dorothey Baseman Other Clinician:  Referring Physician: Terance Hart, DAVID Treating Physician/Extender: Rudene Re in Treatment: 10 Subjective Chief Complaint Information obtained from Patient Patient presents for treatment of an open ulcer due to venous insufficiency. The patient has had a open wound on his right medial ankle for at least 3 years and was last seen in the wound clinic in February 2015. He was lost to follow-up after that. History of Present Illness (HPI) The following HPI elements were documented for the patient's wound: Location: right lower extremity ulceration Quality: Patient reports experiencing a dull pain to affected area(s). Severity: Patient states wound are getting worse. 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 Modifying Factors: Consults to this date include:has received treatment in the wound center in the past but stopped coming here since February 2015 Associated Signs and Symptoms: Patient reports having difficulty standing for long periods. 80 year old male who is known to have progressive weakness and has been in a nursing home for a while has had chronic lower extremity edema both legs and a large open wound on the right lower extremity which is has at least for about 3-4 years. The patient was seen in the wound  clinic before and has been treated until February 2015 when he was lost to follow-up. Past medical history is significant for hypertension, gout, venous stasis ulcers, Parkinson's [name], constipation. He's not been a diabetic but his last hemoglobin A1c was 6 in March 2015. There is documentation that he's had endovenous ablation of bilateral varicose veins but we have no documentation about this and this may be done at least 4-5 years ago. 01/22/2015 -- he has his vascular test is scheduled for this afternoon. 01/29/2015 -- the patient's vascular test was canceled last week because he was unable to get onto the examining bed at [facility]. His Unna's boot was not applied either and the patient had a dressing placed by home health. Today when his dressing was removed we found a lot of maggots in his right lower extremity. 02/07/2015 -- Was seen in the vascular office by the PA and she noted that a bilateral venous duplex study did not show any DVT, SVT or reflux bilaterally. The patient was advised to continue with Unna's boot and would at some stage to require graduated compression stockings of the 20-30 mmHg variety. In the future lymphedema pumps would also be considered. 02/14/2015 -- his dressing is smelling quite a bit and there is a discoloration of the wound suggestive of an [name], [name] ([id number]) infection. Deep tissue cultures will be taken today. 02/21/2015 -- the culture is back and it has growth moderate growth of Proteus and gram-negative rods sensitive to sulfa, ampicillin, cephazolin, Zosyn. He is already on doxycycline and his wound is looking clean and hence we will continue with this. 02/28/2015 -- he's been doing fine still on his antibiotics and has no fresh issues. 03/07/2015 -- I was told that because he lives in a nursing home the Apligraf was denied by his insurance company. 03/14/2015 -- we are awaiting samples and a trial of Puraply to be tried for his  ulceration. 03/21/2015 -- his Apligraf was approved and he is here for his first application of  Apligraf 03/28/2015 -- his wound has had quite a bit of secretion and needs to be changed and hence I will use the second application of Apligraf. 04/04/2015 -- he is here for a wound check but has a lot of secretions and hence his dressing needs to be taken down. 04/09/2015 -- he is here for his third application of Apligraf 04/18/2015 -- he is here for a wound check and though he does not have any overt infection he always has a foul odor to his leg. 04/25/2015 -- his wound has been doing much better and he has minimal drainage and the size is smaller. Of note he is on doxycycline. 05/02/2015 -- he is here for his fourth application of Apligraf 05/09/2015 -- he is here for a wound check and is bothered by a lot of odor from the wound. 05/16/2015 -- he has done very well, the odor and drainage is less with the wound dressing having been changed twice this week. He is here for his last application of Apligraf. 06/20/2015 -- an area superior to the previous wound has now opened up and this is a fairly superficial ulceration. 07/12/2015 -- he is here for his first application of Puraply. This is a sample and this is no charge. 07/18/2015 -- he is here for his second application of Puraply. This is a sample and this is no charge. 07/25/2015 -- he is here for his third application of Puraply. This is a sample and this is no charge. 08/01/2015 -- he is here for his fourth application of Puraply. This is a sample and this is no charge. 08/08/2015 -- he is here for his fifth application of Puraply and this is a sample at no charge. 08/15/2015 -- he is here for his sixth application of Puraply and this is a sample at no charge. 08/29/2015 -- he is here for his eight application of Puraply and this is a sample at no charge. 09/05/2015 -- he is here for his nineth application of Puraply and this is a sample  at no charge. 09/19/2015 -- this is the second week of using the collagen substitute and he is doing very well. 09/26/2015 -- this is the third week of using the collagen substitute. 10/03/2015 -- this is the fourth week of using the collagen substitute. Objective Constitutional Pulse regular. Respirations normal and unlabored. Afebrile. Vitals Time Taken: 8:19 AM, Height: 68 in, Temperature: 98.0 F, Pulse: 72 bpm, Respiratory Rate: 20 Grabski, Yostin P. ([id number]) breaths/min, Blood Pressure: 147/55 mmHg. Eyes Nonicteric. Reactive to light. Ears, Nose, Mouth, and Throat Lips, teeth, and gums WNL.Marland Kitchen Moist mucosa without lesions. Neck supple and nontender. No palpable supraclavicular or cervical adenopathy. Normal sized without goiter. Respiratory WNL. No retractions.. Cardiovascular Pedal Pulses WNL. No clubbing, cyanosis or edema. Lymphatic No adneopathy. No adenopathy. No adenopathy. Musculoskeletal Adexa without tenderness or enlargement.. Digits and nails w/o clubbing, cyanosis, infection, petechiae, ischemia, or inflammatory conditions.Marland Kitchen Psychiatric Judgement and insight Intact.. No evidence of depression, anxiety, or agitation.. General Notes: overall there is a smaller wound which is very healthy and it is close to healing. Integumentary (Hair, Skin) No suspicious lesions. No crepitus or fluctuance. No peri-wound warmth or erythema. No masses.. Wound #5 status is Open. Original cause of wound was Gradually Appeared. The wound is located on the Right,Medial Lower Leg. The wound measures 1.2cm length x 0.4cm width x 0.1cm depth; 0.377cm^2 area and 0.038cm^3 volume. The wound is limited to skin breakdown. There is no tunneling or undermining noted.  There is a medium amount of serosanguineous drainage noted. The wound margin is distinct with the outline attached to the wound base. There is large (67-100%) pink, pale granulation within the wound bed. There is no necrotic tissue  within the wound bed. The periwound skin appearance exhibited: Moist. The periwound skin appearance did not exhibit: Callus, Crepitus, Excoriation, Fluctuance, Friable, Induration, Localized Edema, Rash, Scarring, Dry/Scaly, Maceration, Atrophie Blanche, Cyanosis, Ecchymosis, Hemosiderin Staining, Mottled, Pallor, Rubor, Erythema. Periwound temperature was noted as No Abnormality. 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 Skin Barriers/Peri-Wound Care: Wound #5 Right,Medial Lower Leg: Moisturizing lotion Primary Wound Dressing: Wound #5 Right,Medial Lower Leg: Other: - collagen dressing applied by Dr Meyer Russel today in clinic Secondary Dressing: Wound #5 Right,Medial Lower Leg: ABD pad - Carboflex and ABD; gauze to bolster Mepitel One - over collagen Dressing Change Frequency: Wound #5 Right,Medial Lower Leg: Change dressing every week Follow-up Appointments: Wound #5 Right,Medial Lower Leg: Return Appointment in 1 week. Edema Control: Wound #5 Right,Medial Lower Leg: 4-Layer Compression System - Right Lower Extremity Other: - SNF to provide patient with bilateral 20-46mmHg compression hose to be worn daily. Start left leg now and right leg once wound heals. This is to be worn EVERY day so multiple pairs should be provided. Overall there is good resolution of his wound size and I will use the remaining collagen we have with a 4-layer Profore wrap.In anticipation of his wound healing soon, we have measured him up for some compression stockings both of the pull up and the Velcro  variety OLSEN, MCCUTCHAN ([id number]) Electronic Signature(s) Signed: 10/10/2015 8:55:06 AM By: Evlyn Kanner MD, FACS Entered By: Evlyn Kanner on 10/10/2015 08:55:06 ETHON, WYMER ([id number]) -------------------------------------------------------------------------------- SuperBill Details Patient Name: [name] Date of Service: 10/10/2015 Medical Record Number: [id number] Patient Account Number: [id number] Date of Birth/Sex: Sep 28, 1935 (80 y.o. 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 E66.01 Morbid (severe) obesity due to excess calories I89.0 Lymphedema, not elsewhere classified Facility Procedures CPT4: Description Modifier Quantity Code [id number] (Facility Use Only) [phone] - APPLY MULTLAY COMPRS LWR RT 1 LEG Physician Procedures CPT4 Code Description: [id number] - WC PHYS LEVEL 3 - EST PT ICD-10 Description Diagnosis I87.311 Chronic venous hypertension (idiopathic) with ulcer of 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 E66.01 Morbid (severe) obesity due  to excess calories I89.0 Lymphedema, not elsewhere classified Modifier: right lower s of muscle Quantity: 1 extremity Electronic Signature(s) Signed: 10/10/2015 9:31:11 AM By: Curtis Sites Signed: 10/10/2015 4:29:21 PM By: Evlyn Kanner MD, FACS Previous Signature: 10/10/2015 8:55:21 AM Version By: Evlyn Kanner MD, FACS Entered By: Curtis Sites on 10/10/2015 09:31:11

## 2015-10-11 NOTE — Progress Notes (Signed)
LORRIS, CARDUCCI ([id number]) Visit Report for 10/10/2015 Arrival Information Details Patient Name: [name], [name] Date of Service: 10/10/2015 8:30 AM Medical Record Number: [id number] Patient Account Number: [id number] Date of Birth/Sex: 30-Dec-1935 (80 y.o. Male) Treating RN: Curtis Sites Primary Care Physician: Dorothey Baseman Other Clinician: Referring Physician: Terance Hart, DAVID Treating Physician/Extender: Rudene Re in Treatment: 2 Visit Information History Since Last Visit Added or deleted any medications: No Patient Arrived: Wheel Chair Any new allergies or adverse reactions: No Arrival Time: 08:15 Had a fall or experienced change in No activities of daily living that may affect Accompanied By: self risk of falls: Transfer Assistance: None Signs or symptoms of abuse/neglect since last No Patient Identification Verified: Yes visito Secondary Verification Process Yes Hospitalized since last visit: No Completed: Pain Present Now: No Patient Requires Transmission-Based No Precautions: Patient Has Alerts: No Electronic Signature(s) Signed: 10/10/2015 5:50:07 PM By: Curtis Sites Entered By: Curtis Sites on 10/10/2015 08:19:54 [name] ([id number]) -------------------------------------------------------------------------------- Encounter Discharge Information Details Patient Name: [name] Date of Service: 10/10/2015 8:30 AM Medical Record Number: [id number] Patient Account Number: [id number] Date of Birth/Sex: [birth year] (80 y.o. Male) Treating RN: Curtis Sites Primary Care Physician: Dorothey Baseman Other Clinician: Referring Physician: Dorothey Baseman Treating Physician/Extender: Rudene Re in Treatment: 66 Encounter Discharge Information Items Discharge Pain Level: 0 Discharge Condition: Stable Ambulatory Status: Wheelchair Discharge Destination: Home Transportation: Private Auto Accompanied By: self Schedule  Follow-up Appointment: Yes Medication Reconciliation completed No and provided to Patient/Care Ashwath Lasch: Provided on Clinical Summary of Care: 10/10/2015 Form Type Recipient Paper Patient [facility] Electronic Signature(s) Signed: 10/10/2015 9:31:48 AM By: Curtis Sites Previous Signature: 10/10/2015 9:18:27 AM Version By: Gwenlyn Perking Entered By: Curtis Sites on 10/10/2015 09:31:48 [name] ([id number]) -------------------------------------------------------------------------------- Lower Extremity Assessment Details Patient Name: [name] Date of Service: 10/10/2015 8:30 AM Medical Record Number: [id number] Patient Account Number: [id number] Date of Birth/Sex: [birth year] (80 y.o. Male) Treating RN: Curtis Sites Primary Care Physician: Terance Hart, DAVID Other Clinician: Referring Physician: Terance Hart, DAVID Treating Physician/Extender: Rudene Re in Treatment: 38 Edema Assessment Assessed: [Left: No] [Right: No] Edema: [Left: N] [Right: o] Calf Left: Right: Point of Measurement: 40 cm From Medial Instep cm 38 cm Ankle Left: Right: Point of Measurement: 12 cm From Medial Instep cm 27.5 cm Vascular Assessment Pulses: Posterior Tibial Dorsalis Pedis Palpable: [Right:Yes] Extremity colors, hair growth, and conditions: Extremity Color: [Right:Hyperpigmented] Hair Growth on Extremity: [Right:No] Temperature of Extremity: [Right:Warm] Capillary Refill: [Right:< 3 seconds] Electronic Signature(s) Signed: 10/10/2015 9:30:12 AM By: Curtis Sites Entered By: Curtis Sites on 10/10/2015 09:30:12 [name] ([id number]) -------------------------------------------------------------------------------- Multi Wound Chart Details Patient Name: [name] Date of Service: 10/10/2015 8:30 AM Medical Record Number: [id number] Patient Account Number: [id number] Date of Birth/Sex: 1935-10-04 (80 y.o. Male) Treating RN: Curtis Sites Primary Care Physician:  Dorothey Baseman Other Clinician: Referring Physician: Terance Hart, DAVID Treating Physician/Extender: Rudene Re in Treatment: 38 Vital Signs Height(in): 68 Pulse(bpm): 72 Weight(lbs): Blood Pressure 147/55 (mmHg): Body Mass Index(BMI): Temperature(F): 98.0 Respiratory Rate 20 (breaths/min): Photos: [5:No Photos] [N/A:N/A] Wound Location: [5:Right Lower Leg - Medial] [N/A:N/A] Wounding Event: [5:Gradually Appeared] [N/A:N/A] Primary Etiology: [5:Venous Leg Ulcer] [N/A:N/A] Comorbid History: [5:Cataracts, Anemia, Hypertension, Osteoarthritis] [N/A:N/A] Date Acquired: [5:11/13/2014] [N/A:N/A] Weeks of Treatment: [5:38] [N/A:N/A] Wound Status: [5:Open] [N/A:N/A] Measurements L x W x D 1.2x0.4x0.1 [N/A:N/A] (cm) Area (cm) : [5:0.377] [N/A:N/A] Volume (cm) : [5:0.038] [N/A:N/A] % Reduction in Area: [5:99.60%] [N/A:N/A] % Reduction in Volume: 99.90% [N/A:N/A] Classification: [5:Full Thickness Without Exposed Support  Structures] [N/A:N/A] Exudate Amount: [5:Medium] [N/A:N/A] Exudate Type: [5:Serosanguineous] [N/A:N/A] Exudate Color: [5:red, brown] [N/A:N/A] Wound Margin: [5:Distinct, outline attached] [N/A:N/A] Granulation Amount: [5:Large (67-100%)] [N/A:N/A] Granulation Quality: [5:Pink, Pale] [N/A:N/A] Necrotic Amount: [5:None Present (0%)] [N/A:N/A] Exposed Structures: [5:Fascia: No Fat: No Tendon: No Muscle: No] [N/A:N/A] Joint: No Bone: No Limited to Skin Breakdown Epithelialization: Medium (34-66%) N/A N/A Periwound Skin Texture: Edema: No N/A N/A Excoriation: No Induration: No Callus: No Crepitus: No Fluctuance: No Friable: No Rash: No Scarring: No Periwound Skin Moist: Yes N/A N/A Moisture: Maceration: No Dry/Scaly: No Periwound Skin Color: Atrophie Blanche: No N/A N/A Cyanosis: No Ecchymosis: No Erythema: No Hemosiderin Staining: No Mottled: No Pallor: No Rubor: No Temperature: No Abnormality N/A N/A Tenderness on No N/A  N/A Palpation: Wound Preparation: Ulcer Cleansing: Other: N/A N/A soap and water Topical Anesthetic Applied: None Treatment Notes Electronic Signature(s) Signed: 10/10/2015 9:30:50 AM By: Curtis Sites Entered By: Curtis Sites on 10/10/2015 09:30:50 [name] ([id number]) -------------------------------------------------------------------------------- Multi-Disciplinary Care Plan Details Patient Name: BRAN, ALDRIDGE. Date of Service: 10/10/2015 8:30 AM Medical Record Number: [id number] Patient Account Number: [id number] Date of Birth/Sex: Jul 30, 1935 (80 y.o. Male) Treating RN: Curtis Sites Primary Care Physician: Dorothey Baseman Other Clinician: Referring Physician: Dorothey Baseman Treating Physician/Extender: Rudene Re in Treatment: 57 Active Inactive Orientation to the Wound Care Program Nursing Diagnoses: Knowledge deficit related to the wound healing center program Goals: Patient/caregiver will verbalize understanding of the Wound Healing Center Program Date Initiated: 01/15/2015 Goal Status: Active Interventions: Provide education on orientation to the wound center Notes: Venous Leg Ulcer Nursing Diagnoses: Knowledge deficit related to disease process and management Potential for venous Insuffiency (use before diagnosis confirmed) Goals: Patient will maintain optimal edema control Date Initiated: 01/15/2015 Goal Status: Active Patient/caregiver will verbalize understanding of disease process and disease management Date Initiated: 01/15/2015 Goal Status: Active Verify adequate tissue perfusion prior to therapeutic compression application Date Initiated: 01/15/2015 Goal Status: Active Interventions: Assess peripheral edema status every visit. Compression as ordered Provide education on venous insufficiency HUGH, KAMARA ([id number]) Treatment Activities: Test ordered outside of clinic : 10/10/2015 Therapeutic compression applied :  10/10/2015 Venous Duplex Doppler : 10/10/2015 Notes: Wound/Skin Impairment Nursing Diagnoses: Impaired tissue integrity Knowledge deficit related to ulceration/compromised skin integrity Goals: Patient/caregiver will verbalize understanding of skin care regimen Date Initiated: 01/15/2015 Goal Status: Active Ulcer/skin breakdown will have a volume reduction of 30% by week 4 Date Initiated: 01/15/2015 Goal Status: Active Ulcer/skin breakdown will have a volume reduction of 50% by week 8 Date Initiated: 01/15/2015 Goal Status: Active Ulcer/skin breakdown will have a volume reduction of 80% by week 12 Date Initiated: 01/15/2015 Goal Status: Active Ulcer/skin breakdown will heal within 14 weeks Date Initiated: 01/15/2015 Goal Status: Active Interventions: Assess patient/caregiver ability to perform ulcer/skin care regimen upon admission and as needed Assess ulceration(s) every visit Provide education on ulcer and skin care Notes: Electronic Signature(s) Signed: 10/10/2015 9:30:36 AM By: Curtis Sites Entered By: Curtis Sites on 10/10/2015 09:30:35 [name] ([id number]) -------------------------------------------------------------------------------- Patient/Caregiver Education Details Patient Name: [name] Date of Service: 10/10/2015 8:30 AM Medical Record Number: [id number] Patient Account Number: [id number] Date of Birth/Gender: [birth year] (80 y.o. Male) Treating RN: Curtis Sites Primary Care Physician: Dorothey Baseman Other Clinician: Referring Physician: Dorothey Baseman Treating Physician/Extender: Rudene Re in Treatment: 47 Education Assessment Education Provided To: Patient Education Topics Provided Venous: Handouts: Other: wear compression on other leg daily Methods: Explain/Verbal Responses: State content correctly Electronic Signature(s) Signed: 10/10/2015 9:32:12 AM By: Curtis Sites Entered  By: Curtis Sites on 10/10/2015  09:32:12 [name] ([id number]) -------------------------------------------------------------------------------- Wound Assessment Details Patient Name: JOSTON, PATTON. Date of Service: 10/10/2015 8:30 AM Medical Record Number: [id number] Patient Account Number: [id number] Date of Birth/Sex: [birth year] (80 y.o. Male) Treating RN: Curtis Sites Primary Care Physician: Terance Hart, DAVID Other Clinician: Referring Physician: Terance Hart, DAVID Treating Physician/Extender: Rudene Re in Treatment: 38 Wound Status Wound Number: 5 Primary Venous Leg Ulcer Etiology: Wound Location: Right Lower Leg - Medial Wound Status: Open Wounding Event: Gradually Appeared Comorbid Cataracts, Anemia, Hypertension, Date Acquired: 11/13/2014 History: Osteoarthritis Weeks Of Treatment: 38 Clustered Wound: No Photos Photo Uploaded By: Curtis Sites on 10/10/2015 12:08:44 Wound Measurements Length: (cm) 1.2 Width: (cm) 0.4 Depth: (cm) 0.1 Area: (cm) 0.377 Volume: (cm) 0.038 % Reduction in Area: 99.6% % Reduction in Volume: 99.9% Epithelialization: Medium (34-66%) Tunneling: No Undermining: No Wound Description Full Thickness Without Exposed Classification: Support Structures Wound Margin: Distinct, outline attached Exudate Medium Amount: Exudate Type: Serosanguineous Exudate Color: red, brown Foul Odor After Cleansing: No Wound Bed Granulation Amount: Large (67-100%) Exposed Structure Granulation Quality: Pink, Pale Fascia Exposed: No Necrotic Amount: None Present (0%) Fat Layer Exposed: No JAMESANDREW, KERNAGHAN ([id number]) Tendon Exposed: No Muscle Exposed: No Joint Exposed: No Bone Exposed: No Limited to Skin Breakdown Periwound Skin Texture Texture Color No Abnormalities Noted: No No Abnormalities Noted: No Callus: No Atrophie Blanche: No Crepitus: No Cyanosis: No Excoriation: No Ecchymosis: No Fluctuance: No Erythema: No Friable: No Hemosiderin  Staining: No Induration: No Mottled: No Localized Edema: No Pallor: No Rash: No Rubor: No Scarring: No Temperature / Pain Moisture Temperature: No Abnormality No Abnormalities Noted: No Dry / Scaly: No Maceration: No Moist: Yes Wound Preparation Ulcer Cleansing: Other: soap and water, Topical Anesthetic Applied: None Treatment Notes Wound #5 (Right, Medial Lower Leg) 1. Cleansed with: Cleanse wound with antibacterial soap and water 3. Peri-wound Care: Skin Prep 4. Dressing Applied: Other dressing (specify in notes) 5. Secondary Dressing Applied ABD Pad 7. Secured with 4-Layer Compression System - Right Lower Extremity Notes collagen dressing applied by Dr. Meyer Russel today Electronic Signature(s) Signed: 10/10/2015 5:50:07 PM By: Curtis Sites Entered By: Curtis Sites on 10/10/2015 08:35:07 BAO, BRUCKER ([id number]) LAYKE, CATRETT ([id number]) -------------------------------------------------------------------------------- Vitals Details Patient Name: ARYAN, MAIDA. Date of Service: 10/10/2015 8:30 AM Medical Record Number: [id number] Patient Account Number: [id number] Date of Birth/Sex: 01-20-1936 (80 y.o. Male) Treating RN: Curtis Sites Primary Care Physician: Terance Hart, DAVID Other Clinician: Referring Physician: Terance Hart, DAVID Treating Physician/Extender: Rudene Re in Treatment: 90 Vital Signs Time Taken: 08:19 Temperature (F): 98.0 Height (in): 68 Pulse (bpm): 72 Respiratory Rate (breaths/min): 20 Blood Pressure (mmHg): 147/55 Reference Range: 80 - 120 mg / dl Electronic Signature(s) Signed: 10/10/2015 5:50:07 PM By: Curtis Sites Entered By: Curtis Sites on 10/10/2015 03:79:44

## 2015-10-17 ENCOUNTER — Encounter: Payer: Medicare Other | Admitting: Surgery

## 2015-10-17 DIAGNOSIS — I87311 Chronic venous hypertension (idiopathic) with ulcer of right lower extremity: Secondary | ICD-10-CM | POA: Diagnosis not present

## 2015-10-17 NOTE — Progress Notes (Addendum)
[name], [name] ([id number]) Visit Report for 10/17/2015 Chief Complaint Document Details Patient Name: [name], [name] Date of Service: 10/17/2015 10:00 AM Medical Record Number: [id number] Patient Account Number: [id number] Date of Birth/Sex: 1935-07-23 (80 y.o. Male) Treating RN: Curtis Sites Primary Care Physician: Dorothey Baseman Other Clinician: Referring Physician: Dorothey Baseman Treating Physician/Extender: Rudene Re in Treatment: 39 Information Obtained from: Patient Chief Complaint Patient presents for treatment of an open ulcer due to venous insufficiency. The patient has had a open wound on his right medial ankle for at least 3 years and was last seen in the wound clinic in February 2015. He was lost to follow-up after that. Electronic Signature(s) Signed: 10/17/2015 10:39:44 AM By: Evlyn Kanner MD, FACS Entered By: Evlyn Kanner on 10/17/2015 10:39:44 EWAN, CRESSEY ([id number]) -------------------------------------------------------------------------------- HPI Details Patient Name: [name], [name] Date of Service: 10/17/2015 10:00 AM Medical Record Number: [id number] Patient Account Number: [id number] Date of Birth/Sex: Jun 14, 1936 (80 y.o. Male) Treating RN: Curtis Sites Primary Care Physician: Dorothey Baseman Other Clinician: Referring Physician: Terance Hart, DAVID Treating Physician/Extender: Rudene Re in Treatment: 39 History of Present Illness Location: right lower extremity ulceration Quality: Patient reports experiencing a dull pain to affected area(s). Severity: Patient states wound are getting worse. 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 Modifying Factors: Consults to this date include:has received treatment in the wound center in the past but stopped coming here since February 2015 Associated Signs and Symptoms: Patient reports having difficulty standing for long periods. HPI Description: 80 year old male who is known to have progressive weakness and has been in a nursing home for a while has had chronic lower extremity edema both legs and a large open wound on the right lower extremity which is has at least for about 3-4 years. The patient was seen in the wound clinic before and has been treated until February 2015 when he was lost to follow-up. Past medical history is significant for hypertension, gout, venous stasis ulcers, Parkinson's [name], constipation. He's not been a diabetic but his last hemoglobin A1c was 6 in March 2015. There is documentation that he's had endovenous ablation of bilateral varicose veins but we have no documentation about this and this may be done at least 4-5 years ago. 01/22/2015 -- he has his vascular test is scheduled for this afternoon. 01/29/2015 -- the patient's vascular test was canceled last week because he was unable to get onto the examining bed at [facility]. His Unna's boot was not applied either and the patient had a dressing placed by home health. Today when his dressing was removed we found a lot of maggots in his right lower extremity. 02/07/2015 -- Was seen in the vascular office by the PA and she noted that a bilateral venous duplex study did not show any DVT, SVT or reflux bilaterally. The patient was advised to continue with Unna's boot and would at some stage to require graduated compression stockings of the 20-30 mmHg variety. In the future lymphedema pumps would also be considered. 02/14/2015 -- his dressing is smelling quite a bit and there is a discoloration of the wound suggestive of an infection. Deep tissue cultures will be taken today. 02/21/2015 --  the culture  is back and it has growth moderate growth of Proteus and gram-negative rods sensitive to sulfa, ampicillin, cephazolin, Zosyn. He is already on doxycycline and his wound is looking clean and hence we will continue with this. 02/28/2015 -- he's been doing fine still on his antibiotics and has no fresh issues. 03/07/2015 -- I was told that because he lives in a nursing home the Apligraf was denied by his insurance company. 03/14/2015 -- we are awaiting samples and a trial of Puraply to be tried for his ulceration. 03/21/2015 -- his Apligraf was approved and he is here for his first application of Apligraf 03/28/2015 -- his wound has had quite a bit of secretion and needs to be changed and hence I will use the PAUL, TRETTIN. ([id number]) second application of Apligraf. 04/04/2015 -- he is here for a wound check but has a lot of secretions and hence his dressing needs to be taken down. 04/09/2015 -- he is here for his third application of Apligraf 04/18/2015 -- he is here for a wound check and though he does not have any overt infection he always has a foul odor to his leg. 04/25/2015 -- his wound has been doing much better and he has minimal drainage and the size is smaller. Of note he is on doxycycline. 05/02/2015 -- he is here for his fourth application of Apligraf 05/09/2015 -- he is here for a wound check and is bothered by a lot of odor from the wound. 05/16/2015 -- he has done very well, the odor and drainage is less with the wound dressing having been changed twice this week. He is here for his last application of Apligraf. 06/20/2015 -- an area superior to the previous wound has now opened up and this is a fairly superficial ulceration. 07/12/2015 -- he is here for his first application of Puraply. This is a sample and this is no charge. 07/18/2015 -- he is here for his second application of Puraply. This is a sample and this is no charge. 07/25/2015 -- he is here for his third  application of Puraply. This is a sample and this is no charge. 08/01/2015 -- he is here for his fourth application of Puraply. This is a sample and this is no charge. 08/08/2015 -- he is here for his fifth application of Puraply and this is a sample at no charge. 08/15/2015 -- he is here for his sixth application of Puraply and this is a sample at no charge. 08/29/2015 -- he is here for his eight application of Puraply and this is a sample at no charge. 09/05/2015 -- he is here for his nineth application of Puraply and this is a sample at no charge. 09/19/2015 -- this is the second week of using the collagen substitute and he is doing very well. 09/26/2015 -- this is the third week of using the collagen substitute. 10/03/2015 -- this is the fourth week of using the collagen substitute. Electronic Signature(s) Signed: 10/17/2015 10:40:10 AM By: Evlyn Kanner MD, FACS Entered By: Evlyn Kanner on 10/17/2015 10:40:09 JONTAY, MASTON ([id number]) -------------------------------------------------------------------------------- Physical Exam Details Patient Name: [name], [name] Date of Service: 10/17/2015 10:00 AM Medical Record Number: [id number] Patient Account Number: [id number] Date of Birth/Sex: 05-Sep-1935 (80 y.o. Male) Treating RN: Curtis Sites Primary Care Physician: Dorothey Baseman Other Clinician: Referring Physician: Terance Hart, DAVID Treating Physician/Extender: Rudene Re in Treatment: 39 Constitutional . Pulse regular. Respirations normal and unlabored. Afebrile. . Eyes Nonicteric. Reactive to light. Ears, Nose,  Mouth, and Throat Lips, teeth, and gums WNL.Marland Kitchen Moist mucosa without lesions. Neck supple and nontender. No palpable supraclavicular or cervical adenopathy. Normal sized without goiter. Respiratory WNL. No retractions.. Breath sounds WNL, No rubs, rales, rhonchi, or wheeze.. Cardiovascular Heart rhythm and rate regular, no murmur or gallop.. Pedal  Pulses WNL. No clubbing, cyanosis or edema. Lymphatic No adneopathy. No adenopathy. No adenopathy. Musculoskeletal Adexa without tenderness or enlargement.. Digits and nails w/o clubbing, cyanosis, infection, petechiae, ischemia, or inflammatory conditions.. Integumentary (Hair, Skin) No suspicious lesions. No crepitus or fluctuance. No peri-wound warmth or erythema. No masses.Marland Kitchen Psychiatric Judgement and insight Intact.. No evidence of depression, anxiety, or agitation.. Notes the wound is now only about half a centimeter in diameter and there are several islands of good epithelial coverage. Electronic Signature(s) Signed: 10/17/2015 10:40:41 AM By: Evlyn Kanner MD, FACS Entered By: Evlyn Kanner on 10/17/2015 10:40:41 [name], [name] ([id number]) -------------------------------------------------------------------------------- Physician Orders Details Patient Name: [name], [name] Date of Service: 10/17/2015 10:00 AM Medical Record Number: [id number] Patient Account Number: [id number] Date of Birth/Sex: [birth year] (80 y.o. 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 Skin Barriers/Peri-Wound Care Wound #5 Right,Medial Lower Leg o Moisturizing lotion Primary Wound Dressing Wound #5 Right,Medial Lower Leg o Other: - collagen dressing  applied by Dr Meyer Russel today in clinic Secondary Dressing Wound #5 Right,Medial Lower Leg o ABD pad - Carboflex and ABD; gauze to bolster o Mepitel One - over collagen Dressing Change Frequency Wound #5 Right,Medial Lower Leg o Change dressing every week Follow-up Appointments Wound #5 Right,Medial Lower Leg o Return Appointment in 1 week. Edema Control TAE, VONADA ([id number]) Wound #5 Right,Medial Lower Leg o 4-Layer Compression System - Right Lower Extremity o Other: - SNF to provide patient with bilateral 20-45mmHg compression hose to be worn daily. Start left leg now and right leg once wound heals. This is to be worn EVERY day so multiple pairs should be provided. Electronic Signature(s) Signed: 10/17/2015 4:03:09 PM By: Curtis Sites Signed: 10/17/2015 4:13:42 PM By: Evlyn Kanner MD, FACS Entered By: Curtis Sites on 10/17/2015 10:36:57 Felipe, Paluch Chrissie Noa ([id number]) -------------------------------------------------------------------------------- Problem List Details Patient Name: CHRISTOBAL, MORADO. Date of Service: 10/17/2015 10:00 AM Medical Record Number: [id number] Patient Account Number: [id number] Date of Birth/Sex: 06/19/1936 (80 y.o. 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s of 01/15/2015 Yes muscle E66.01 Morbid (severe) obesity due to excess calories 01/15/2015 Yes I89.0 Lymphedema, not elsewhere classified 02/07/2015 Yes Inactive Problems Resolved Problems Electronic Signature(s) Signed: 10/17/2015 10:39:34 AM By: Evlyn Kanner MD, FACS Previous Signature:  10/17/2015 10:34:44 AM Version By: Evlyn Kanner MD, FACS Entered By: Evlyn Kanner on 10/17/2015 10:39:33 [name], [name] ([id number]) -------------------------------------------------------------------------------- Progress Note Details Patient Name: [name] Cockayne  P. Date of Service: 10/17/2015 10:00 AM Medical Record Number: [id number] Patient Account Number: [id number] Date of Birth/Sex: [birth year] (80 y.o. Male) Treating RN: Curtis Sites Primary Care Physician: Dorothey Baseman Other Clinician: Referring Physician: Dorothey Baseman Treating Physician/Extender: Rudene Re in Treatment: 46 Subjective Chief Complaint Information obtained from Patient Patient presents for treatment of an open ulcer due to venous insufficiency. The patient has had a open wound on his right medial ankle for at least 3 years and was last seen in the wound clinic in February 2015. He was lost to follow-up after that. History of Present Illness (HPI) The following HPI elements were documented for the patient's wound: Location: right lower extremity ulceration Quality: Patient reports experiencing a dull pain to affected area(s). Severity: Patient states wound are getting worse. 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 Modifying Factors: Consults to this date include:has received treatment in the wound center in the past but stopped coming here since February 2015 Associated Signs and Symptoms: Patient reports having difficulty standing for long periods. 80 year old male who is known to have progressive weakness and has been in a nursing home for a while has had chronic lower extremity edema both legs and a  large open wound on the right lower extremity which is has at least for about 3-4 years. The patient was seen in the wound clinic before and has been treated until February 2015 when he was lost to follow-up. Past medical history is significant for hypertension, gout, venous stasis ulcers, Parkinson's [name], constipation. He's not been a diabetic but his last hemoglobin A1c was 6 in March 2015. There is documentation that he's had endovenous ablation of bilateral varicose veins but we have no documentation about this and this may be done at least 4-5 years ago. 01/22/2015 -- he has his vascular test is scheduled for this afternoon. 01/29/2015 -- the patient's vascular test was canceled last week because he was unable to get onto the examining bed at [facility]. His Unna's boot was not applied either and the patient had a dressing placed by home health. Today when his dressing was removed we found a lot of maggots in his right lower extremity. 02/07/2015 -- Was seen in the vascular office by the PA and she noted that a bilateral venous duplex study did not show any DVT, SVT or reflux bilaterally. The patient was advised to continue with Unna's boot and would at some stage to require graduated compression stockings of the 20-30 mmHg variety. In the future lymphedema pumps would also be considered. 02/14/2015 -- his dressing is smelling quite a bit and there is a discoloration of the wound suggestive of an [name], [name] ([id number]) infection. Deep tissue cultures will be taken today. 02/21/2015 -- the culture is back and it has growth moderate growth of Proteus and gram-negative rods sensitive to sulfa, ampicillin, cephazolin, Zosyn. He is already on doxycycline and his wound is looking clean and hence we will continue with this. 02/28/2015 -- he's been doing fine still on his antibiotics and has no fresh issues. 03/07/2015 -- I was told that because he lives in a nursing home the Apligraf  was denied by his insurance  company. 03/14/2015 -- we are awaiting samples and a trial of Puraply to be tried for his ulceration. 03/21/2015 -- his Apligraf was approved and he is here for his first application of Apligraf 03/28/2015 -- his wound has had quite a bit of secretion and needs to be changed and hence I will use the second application of Apligraf. 04/04/2015 -- he is here for a wound check but has a lot of secretions and hence his dressing needs to be taken down. 04/09/2015 -- he is here for his third application of Apligraf 04/18/2015 -- he is here for a wound check and though he does not have any overt infection he always has a foul odor to his leg. 04/25/2015 -- his wound has been doing much better and he has minimal drainage and the size is smaller. Of note he is on doxycycline. 05/02/2015 -- he is here for his fourth application of Apligraf 05/09/2015 -- he is here for a wound check and is bothered by a lot of odor from the wound. 05/16/2015 -- he has done very well, the odor and drainage is less with the wound dressing having been changed twice this week. He is here for his last application of Apligraf. 06/20/2015 -- an area superior to the previous wound has now opened up and this is a fairly superficial ulceration. 07/12/2015 -- he is here for his first application of Puraply. This is a sample and this is no charge. 07/18/2015 -- he is here for his second application of Puraply. This is a sample and this is no charge. 07/25/2015 -- he is here for his third application of Puraply. This is a sample and this is no charge. 08/01/2015 -- he is here for his fourth application of Puraply. This is a sample and this is no charge. 08/08/2015 -- he is here for his fifth application of Puraply and this is a sample at no charge. 08/15/2015 -- he is here for his sixth application of Puraply and this is a sample at no charge. 08/29/2015 -- he is here for his eight application of Puraply  and this is a sample at no charge. 09/05/2015 -- he is here for his nineth application of Puraply and this is a sample at no charge. 09/19/2015 -- this is the second week of using the collagen substitute and he is doing very well. 09/26/2015 -- this is the third week of using the collagen substitute. 10/03/2015 -- this is the fourth week of using the collagen substitute. Objective Constitutional Pulse regular. Respirations normal and unlabored. Afebrile. Vitals Time Taken: 10:06 AM, Height: 68 in, Temperature: 97.8 F, Pulse: 70 bpm, Respiratory Rate: 20 [name], [name] P. ([id number]) breaths/min, Blood Pressure: 135/59 mmHg. Eyes Nonicteric. Reactive to light. Ears, Nose, Mouth, and Throat Lips, teeth, and gums WNL.Marland Kitchen Moist mucosa without lesions. Neck supple and nontender. No palpable supraclavicular or cervical adenopathy. Normal sized without goiter. Respiratory WNL. No retractions.. Breath sounds WNL, No rubs, rales, rhonchi, or wheeze.. Cardiovascular Heart rhythm and rate regular, no murmur or gallop.. Pedal Pulses WNL. No clubbing, cyanosis or edema. Lymphatic No adneopathy. No adenopathy. No adenopathy. Musculoskeletal Adexa without tenderness or enlargement.. Digits and nails w/o clubbing, cyanosis, infection, petechiae, ischemia, or inflammatory conditions.Marland Kitchen Psychiatric Judgement and insight Intact.. No evidence of depression, anxiety, or agitation.. General Notes: the wound is now only about half a centimeter in diameter and there are several islands of good epithelial coverage. Integumentary (Hair, Skin) No suspicious lesions. No crepitus or fluctuance. No peri-wound warmth  or erythema. No masses.. Wound #5 status is Open. Original cause of wound was Gradually Appeared. The wound is located on the Right,Medial Lower Leg. The wound measures 1cm length x 0.3cm width x 0.1cm depth; 0.236cm^2 area and 0.024cm^3 volume. The wound is limited to skin breakdown. There is no  tunneling or undermining noted. There is a medium amount of serosanguineous drainage noted. The wound margin is distinct with the outline attached to the wound base. There is large (67-100%) pink, pale granulation within the wound bed. There is no necrotic tissue within the wound bed. The periwound skin appearance exhibited: Moist. The periwound skin appearance did not exhibit: Callus, Crepitus, Excoriation, Fluctuance, Friable, Induration, Localized Edema, Rash, Scarring, Dry/Scaly, Maceration, Atrophie Blanche, Cyanosis, Ecchymosis, Hemosiderin Staining, Mottled, Pallor, Rubor, Erythema. Periwound temperature was noted as No Abnormality. 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 Skin Barriers/Peri-Wound Care: Wound #5 Right,Medial Lower Leg: Moisturizing lotion Primary Wound Dressing: Wound #5 Right,Medial Lower Leg: Other: - collagen dressing applied by Dr Meyer Russel today in clinic Secondary Dressing: Wound #5 Right,Medial Lower Leg: ABD pad - Carboflex and ABD; gauze to bolster Mepitel One - over collagen Dressing Change Frequency: Wound #5 Right,Medial Lower Leg: Change dressing every week Follow-up Appointments: Wound #5 Right,Medial Lower Leg: Return Appointment in 1 week. Edema Control: Wound #5 Right,Medial Lower Leg: 4-Layer Compression System - Right Lower Extremity Other: - SNF to provide patient with bilateral 20-30mmHg compression hose to be worn daily. Start left leg now and right leg once wound heals. This is to be worn EVERY day so multiple pairs should be provided. [name], [name] ([id number]) I will use the remaining collagen we have with a 4-layer Profore wrap.In anticipation of his wound healing soon, we have measured him up for some compression stockings both of the pull up and the Velcro variety Electronic Signature(s) Signed: 10/17/2015 10:41:10 AM By: Evlyn Kanner MD, FACS Entered By: Evlyn Kanner on 10/17/2015 10:41:10 [name] ([id number]) -------------------------------------------------------------------------------- SuperBill Details Patient Name: [name]. Date of Service: 10/17/2015 Medical Record Number: [id number] Patient Account Number: [id number] Date of Birth/Sex: 30-Dec-1935 (80 y.o. 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 E66.01 Morbid (severe) obesity due to excess calories I89.0 Lymphedema, not elsewhere classified Facility Procedures CPT4: Description Modifier Quantity Code [id number] (Facility Use Only) [phone] - APPLY MULTLAY COMPRS LWR RT 1 LEG Physician Procedures CPT4 Code Description: [id number] - WC PHYS LEVEL 3 - EST PT ICD-10 Description Diagnosis I87.311 Chronic venous hypertension (idiopathic) with ulcer of 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 E66.01 Morbid (severe) obesity due  to excess calories I89.0 Lymphedema, not elsewhere classified Modifier: right lower s of muscle Quantity: 1 extremity Electronic Signature(s) Signed: 10/17/2015 3:18:51 PM By: Curtis Sites Signed: 10/17/2015 4:13:42 PM By: Evlyn Kanner MD, FACS Previous Signature: 10/17/2015 10:41:36 AM Version By: Evlyn Kanner MD, FACS Entered By: Curtis Sites on 10/17/2015  15:18:50 

## 2015-10-18 NOTE — Progress Notes (Signed)
[name], ZAGAMI ([id number]) Visit Report for 10/17/2015 Arrival Information Details Patient Name: [name], [name] Date of Service: 10/17/2015 10:00 AM Medical Record Number: [id number] Patient Account Number: [id number] Date of Birth/Sex: 1936/01/03 (80 y.o. Male) Treating RN: Curtis Sites Primary Care Physician: Dorothey Baseman Other Clinician: Referring Physician: Terance Hart, DAVID Treating Physician/Extender: Rudene Re in Treatment: 39 Visit Information History Since Last Visit Added or deleted any medications: No Patient Arrived: Wheel Chair Any new allergies or adverse reactions: No Arrival Time: 10:02 Had a fall or experienced change in No activities of daily living that may affect Accompanied By: self risk of falls: Transfer Assistance: None Signs or symptoms of abuse/neglect since last No Patient Identification Verified: Yes visito Secondary Verification Process Yes Hospitalized since last visit: No Completed: Pain Present Now: No Patient Requires Transmission-Based No Precautions: Patient Has Alerts: No Electronic Signature(s) Signed: 10/17/2015 4:03:09 PM By: Curtis Sites Entered By: Curtis Sites on 10/17/2015 10:06:24 [name] ([id number]) -------------------------------------------------------------------------------- Encounter Discharge Information Details Patient Name: [name] Date of Service: 10/17/2015 10:00 AM Medical Record Number: [id number] Patient Account Number: [id number] Date of Birth/Sex: 11-Oct-1935 (80 y.o. Male) Treating RN: Curtis Sites Primary Care Physician: Dorothey Baseman Other Clinician: Referring Physician: Dorothey Baseman Treating Physician/Extender: Rudene Re in Treatment: 3 Encounter Discharge Information Items Discharge Pain Level: 0 Discharge Condition: Stable Ambulatory Status: Wheelchair Discharge Destination: Nursing Home Transportation: Private Auto Accompanied By:  self Schedule Follow-up Appointment: Yes Medication Reconciliation completed and provided to Patient/Care No Theoplis Garciagarcia: Provided on Clinical Summary of Care: 10/17/2015 Form Type Recipient Paper Patient [facility] Electronic Signature(s) Signed: 10/17/2015 3:19:53 PM By: Curtis Sites Previous Signature: 10/17/2015 10:53:20 AM Version By: Gwenlyn Perking Entered By: Curtis Sites on 10/17/2015 15:19:53 Mauri, Tolen Chrissie Noa ([id number]) -------------------------------------------------------------------------------- Lower Extremity Assessment Details Patient Name: [name] Date of Service: 10/17/2015 10:00 AM Medical Record Number: [id number] Patient Account Number: [id number] Date of Birth/Sex: Apr 16, 1936 (80 y.o. Male) Treating RN: Curtis Sites Primary Care Physician: Terance Hart, DAVID Other Clinician: Referring Physician: Terance Hart, DAVID Treating Physician/Extender: Rudene Re in Treatment: 39 Edema Assessment Assessed: [Left: No] [Right: No] Edema: [Left: N] [Right: o] Calf Left: Right: Point of Measurement: 40 cm From Medial Instep cm 38.5 cm Ankle Left: Right: Point of Measurement: 12 cm From Medial Instep cm 28 cm Vascular Assessment Pulses: Posterior Tibial Dorsalis Pedis Palpable: [Right:Yes] Extremity colors, hair growth, and conditions: Extremity Color: [Right:Hyperpigmented] Hair Growth on Extremity: [Right:No] Temperature of Extremity: [Right:Warm] Capillary Refill: [Right:< 3 seconds] Toe Nail Assessment Left: Right: Thick: Yes Discolored: Yes Deformed: Yes Improper Length and Hygiene: No Electronic Signature(s) Signed: 10/17/2015 4:03:09 PM By: Curtis Sites Entered By: Curtis Sites on 10/17/2015 10:16:24 [name] ([id number]) -------------------------------------------------------------------------------- Multi Wound Chart Details Patient Name: [name] Date of Service: 10/17/2015 10:00 AM Medical Record Number:  [id number] Patient Account Number: [id number] Date of Birth/Sex: Apr 14, 1936 (79 y.o. Male) Treating RN: Curtis Sites Primary Care Physician: Dorothey Baseman Other Clinician: Referring Physician: Terance Hart, DAVID Treating Physician/Extender: Rudene Re in Treatment: 39 Vital Signs Height(in): 68 Pulse(bpm): 70 Weight(lbs): Blood Pressure 135/59 (mmHg): Body Mass Index(BMI): Temperature(F): 97.8 Respiratory Rate 20 (breaths/min): Photos: [5:No Photos] [N/A:N/A] Wound Location: [5:Right Lower Leg - Medial] [N/A:N/A] Wounding Event: [5:Gradually Appeared] [N/A:N/A] Primary Etiology: [5:Venous Leg Ulcer] [N/A:N/A] Comorbid History: [5:Cataracts, Anemia, Hypertension, Osteoarthritis] [N/A:N/A] Date Acquired: [5:11/13/2014] [N/A:N/A] Weeks of Treatment: [5:39] [N/A:N/A] Wound Status: [5:Open] [N/A:N/A] Measurements L x W x D 1x0.3x0.1 [N/A:N/A] (cm) Area (cm) : [5:0.236] [N/A:N/A] Volume (cm) : [5:0.024] [N/A:N/A] %  Reduction in Area: [5:99.80%] [N/A:N/A] % Reduction in Volume: 99.90% [N/A:N/A] Classification: [5:Full Thickness Without Exposed Support Structures] [N/A:N/A] Exudate Amount: [5:Medium] [N/A:N/A] Exudate Type: [5:Serosanguineous] [N/A:N/A] Exudate Color: [5:red, brown] [N/A:N/A] Wound Margin: [5:Distinct, outline attached] [N/A:N/A] Granulation Amount: [5:Large (67-100%)] [N/A:N/A] Granulation Quality: [5:Pink, Pale] [N/A:N/A] Necrotic Amount: [5:None Present (0%)] [N/A:N/A] Exposed Structures: [5:Fascia: No Fat: No Tendon: No Muscle: No] [N/A:N/A] Joint: No Bone: No Limited to Skin Breakdown Epithelialization: Medium (34-66%) N/A N/A Periwound Skin Texture: Edema: No N/A N/A Excoriation: No Induration: No Callus: No Crepitus: No Fluctuance: No Friable: No Rash: No Scarring: No Periwound Skin Moist: Yes N/A N/A Moisture: Maceration: No Dry/Scaly: No Periwound Skin Color: Atrophie Blanche: No N/A N/A Cyanosis: No Ecchymosis:  No Erythema: No Hemosiderin Staining: No Mottled: No Pallor: No Rubor: No Temperature: No Abnormality N/A N/A Tenderness on No N/A N/A Palpation: Wound Preparation: Ulcer Cleansing: Other: N/A N/A soap and water Topical Anesthetic Applied: None Treatment Notes Electronic Signature(s) Signed: 10/17/2015 4:03:09 PM By: Curtis Sites Entered By: Curtis Sites on 10/17/2015 10:38:02 [name] ([id number]) -------------------------------------------------------------------------------- Multi-Disciplinary Care Plan Details Patient Name: ELESTER, APODACA. Date of Service: 10/17/2015 10:00 AM Medical Record Number: [id number] Patient Account Number: [id number] Date of Birth/Sex: 1935-08-05 (80 y.o. Male) Treating RN: Curtis Sites Primary Care Physician: Dorothey Baseman Other Clinician: Referring Physician: Dorothey Baseman Treating Physician/Extender: Rudene Re in Treatment: 34 Active Inactive Orientation to the Wound Care Program Nursing Diagnoses: Knowledge deficit related to the wound healing center program Goals: Patient/caregiver will verbalize understanding of the Wound Healing Center Program Date Initiated: 01/15/2015 Goal Status: Active Interventions: Provide education on orientation to the wound center Notes: Venous Leg Ulcer Nursing Diagnoses: Knowledge deficit related to disease process and management Potential for venous Insuffiency (use before diagnosis confirmed) Goals: Patient will maintain optimal edema control Date Initiated: 01/15/2015 Goal Status: Active Patient/caregiver will verbalize understanding of disease process and disease management Date Initiated: 01/15/2015 Goal Status: Active Verify adequate tissue perfusion prior to therapeutic compression application Date Initiated: 01/15/2015 Goal Status: Active Interventions: Assess peripheral edema status every visit. Compression as ordered Provide education on venous  insufficiency BRAIDYN, SCORSONE ([id number]) Treatment Activities: Test ordered outside of clinic : 10/17/2015 Therapeutic compression applied : 10/17/2015 Venous Duplex Doppler : 10/17/2015 Notes: Wound/Skin Impairment Nursing Diagnoses: Impaired tissue integrity Knowledge deficit related to ulceration/compromised skin integrity Goals: Patient/caregiver will verbalize understanding of skin care regimen Date Initiated: 01/15/2015 Goal Status: Active Ulcer/skin breakdown will have a volume reduction of 30% by week 4 Date Initiated: 01/15/2015 Goal Status: Active Ulcer/skin breakdown will have a volume reduction of 50% by week 8 Date Initiated: 01/15/2015 Goal Status: Active Ulcer/skin breakdown will have a volume reduction of 80% by week 12 Date Initiated: 01/15/2015 Goal Status: Active Ulcer/skin breakdown will heal within 14 weeks Date Initiated: 01/15/2015 Goal Status: Active Interventions: Assess patient/caregiver ability to perform ulcer/skin care regimen upon admission and as needed Assess ulceration(s) every visit Provide education on ulcer and skin care Notes: Electronic Signature(s) Signed: 10/17/2015 4:03:09 PM By: Curtis Sites Entered By: Curtis Sites on 10/17/2015 10:37:21 [name] ([id number]) -------------------------------------------------------------------------------- Patient/Caregiver Education Details Patient Name: [name] Date of Service: 10/17/2015 10:00 AM Medical Record Number: [id number] Patient Account Number: [id number] Date of Birth/Gender: 03-Apr-1936 (80 y.o. Male) Treating RN: Curtis Sites Primary Care Physician: Dorothey Baseman Other Clinician: Referring Physician: Dorothey Baseman Treating Physician/Extender: Rudene Re in Treatment: 22 Education Assessment Education Provided To: Patient Education Topics Provided Venous: Handouts: Other: order sent for compression hose  to SNF Methods:  Explain/Verbal Responses: State content correctly Electronic Signature(s) Signed: 10/17/2015 3:20:24 PM By: Curtis Sites Entered By: Curtis Sites on 10/17/2015 15:20:23 KORD, MONETTE ([id number]) -------------------------------------------------------------------------------- Wound Assessment Details Patient Name: EBEN, CHOINSKI. Date of Service: 10/17/2015 10:00 AM Medical Record Number: [id number] Patient Account Number: [id number] Date of Birth/Sex: 18-Dec-1935 (80 y.o. Male) Treating RN: Curtis Sites Primary Care Physician: Terance Hart, DAVID Other Clinician: Referring Physician: Terance Hart, DAVID Treating Physician/Extender: Rudene Re in Treatment: 39 Wound Status Wound Number: 5 Primary Venous Leg Ulcer Etiology: Wound Location: Right Lower Leg - Medial Wound Status: Open Wounding Event: Gradually Appeared Comorbid Cataracts, Anemia, Hypertension, Date Acquired: 11/13/2014 History: Osteoarthritis Weeks Of Treatment: 39 Clustered Wound: No Wound Measurements Length: (cm) 1 Width: (cm) 0.3 Depth: (cm) 0.1 Area: (cm) 0.236 Volume: (cm) 0.024 % Reduction in Area: 99.8% % Reduction in Volume: 99.9% Epithelialization: Medium (34-66%) Tunneling: No Undermining: No Wound Description Full Thickness Without Exposed Classification: Support Structures Wound Margin: Distinct, outline attached Exudate Medium Amount: Exudate Type: Serosanguineous Exudate Color: red, brown Foul Odor After Cleansing: No Wound Bed Granulation Amount: Large (67-100%) Exposed Structure Granulation Quality: Pink, Pale Fascia Exposed: No Necrotic Amount: None Present (0%) Fat Layer Exposed: No Tendon Exposed: No Muscle Exposed: No Joint Exposed: No Bone Exposed: No Limited to Skin Breakdown Periwound Skin Texture Texture Color No Abnormalities Noted: No No Abnormalities Noted: No Callus: No Atrophie Blanche: No Crepitus: No Cyanosis: No TAELOR, MONCADA  ([id number]) Excoriation: No Ecchymosis: No Fluctuance: No Erythema: No Friable: No Hemosiderin Staining: No Induration: No Mottled: No Localized Edema: No Pallor: No Rash: No Rubor: No Scarring: No Temperature / Pain Moisture Temperature: No Abnormality No Abnormalities Noted: No Dry / Scaly: No Maceration: No Moist: Yes Wound Preparation Ulcer Cleansing: Other: soap and water, Topical Anesthetic Applied: None Treatment Notes Wound #5 (Right, Medial Lower Leg) 1. Cleansed with: Clean wound with Normal Saline Cleanse wound with antibacterial soap and water 3. Peri-wound Care: Skin Prep 4. Dressing Applied: Other dressing (specify in notes) 5. Secondary Dressing Applied ABD Pad 7. Secured with 4-Layer Compression System - Right Lower Extremity Notes collagen dressing applied by Dr. Meyer Russel today Electronic Signature(s) Signed: 10/17/2015 4:03:09 PM By: Curtis Sites Entered By: Curtis Sites on 10/17/2015 10:37:51 Jermone, Geister Chrissie Noa ([id number]) -------------------------------------------------------------------------------- Vitals Details Patient Name: [name] Date of Service: 10/17/2015 10:00 AM Medical Record Number: [id number] Patient Account Number: [id number] Date of Birth/Sex: Dec 02, 1935 (80 y.o. Male) Treating RN: Curtis Sites Primary Care Physician: Terance Hart, DAVID Other Clinician: Referring Physician: Terance Hart, DAVID Treating Physician/Extender: Rudene Re in Treatment: 39 Vital Signs Time Taken: 10:06 Temperature (F): 97.8 Height (in): 68 Pulse (bpm): 70 Respiratory Rate (breaths/min): 20 Blood Pressure (mmHg): 135/59 Reference Range: 80 - 120 mg / dl Electronic Signature(s) Signed: 10/17/2015 4:03:09 PM By: Curtis Sites Entered By: Curtis Sites on 10/17/2015 10:07:50

## 2015-10-24 ENCOUNTER — Encounter: Payer: Medicare Other | Admitting: Surgery

## 2015-10-24 DIAGNOSIS — I87311 Chronic venous hypertension (idiopathic) with ulcer of right lower extremity: Secondary | ICD-10-CM | POA: Diagnosis not present

## 2015-10-25 NOTE — Progress Notes (Signed)
[name] ([id number]) Visit Report for 10/24/2015 Arrival Information Details Patient Name: [name], [name] Date of Service: 10/24/2015 12:45 PM Medical Record Number: [id number] Patient Account Number: [id number] Date of Birth/Sex: 12/02/1935 (80 y.o. Male) Treating RN: Curtis Sites Primary Care Physician: Dorothey Baseman Other Clinician: Referring Physician: Terance Hart, DAVID Treating Physician/Extender: Rudene Re in Treatment: 40 Visit Information History Since Last Visit Added or deleted any medications: No Patient Arrived: Wheel Chair Any new allergies or adverse reactions: No Arrival Time: 12:49 Had a fall or experienced change in No activities of daily living that may affect Accompanied By: self risk of falls: Transfer Assistance: None Signs or symptoms of abuse/neglect since last No Patient Identification Verified: Yes visito Secondary Verification Process Yes Hospitalized since last visit: No Completed: Pain Present Now: No Patient Requires Transmission-Based No Precautions: Patient Has Alerts: No Electronic Signature(s) Signed: 10/24/2015 5:12:38 PM By: Curtis Sites Entered By: Curtis Sites on 10/24/2015 12:54:53 [name] ([id number]) -------------------------------------------------------------------------------- Encounter Discharge Information Details Patient Name: [name] Date of Service: 10/24/2015 12:45 PM Medical Record Number: [id number] Patient Account Number: [id number] Date of Birth/Sex: [birth year] (80 y.o. Male) Treating RN: Curtis Sites Primary Care Physician: Dorothey Baseman Other Clinician: Referring Physician: Dorothey Baseman Treating Physician/Extender: Rudene Re in Treatment: 40 Encounter Discharge Information Items Discharge Pain Level: 0 Discharge Condition: Stable Ambulatory Status: Wheelchair Discharge Destination: Home Transportation: Private Auto Accompanied By: self Schedule  Follow-up Appointment: Yes Medication Reconciliation completed and provided to Patient/Care No Gailya Tauer: Provided on Clinical Summary of Care: 10/24/2015 Form Type Recipient Paper Patient [facility] Electronic Signature(s) Signed: 10/24/2015 2:05:46 PM By: Curtis Sites Previous Signature: 10/24/2015 1:30:22 PM Version By: Gwenlyn Perking Entered By: Curtis Sites on 10/24/2015 14:05:46 [name] ([id number]) -------------------------------------------------------------------------------- Lower Extremity Assessment Details Patient Name: [name] Date of Service: 10/24/2015 12:45 PM Medical Record Number: [id number] Patient Account Number: [id number] Date of Birth/Sex: 1935-08-12 (80 y.o. Male) Treating RN: Curtis Sites Primary Care Physician: Terance Hart, DAVID Other Clinician: Referring Physician: Terance Hart, DAVID Treating Physician/Extender: Rudene Re in Treatment: 40 Edema Assessment Assessed: [Left: No] Franne Forts: No] Edema: [Left: Ye] [Right: s] Calf Left: Right: Point of Measurement: 40 cm From Medial Instep cm 38 cm Ankle Left: Right: Point of Measurement: 12 cm From Medial Instep cm 27.5 cm Vascular Assessment Pulses: Posterior Tibial Dorsalis Pedis Palpable: [Right:Yes] Extremity colors, hair growth, and conditions: Extremity Color: [Right:Hyperpigmented] Hair Growth on Extremity: [Right:No] Temperature of Extremity: [Right:Warm] Capillary Refill: [Right:< 3 seconds] Electronic Signature(s) Signed: 10/24/2015 5:12:38 PM By: Curtis Sites Entered By: Curtis Sites on 10/24/2015 13:00:59 [name] ([id number]) -------------------------------------------------------------------------------- Multi Wound Chart Details Patient Name: [name] Date of Service: 10/24/2015 12:45 PM Medical Record Number: [id number] Patient Account Number: [id number] Date of Birth/Sex: Oct 15, 1935 (80 y.o. Male) Treating RN: Curtis Sites Primary Care  Physician: Dorothey Baseman Other Clinician: Referring Physician: Terance Hart, DAVID Treating Physician/Extender: Rudene Re in Treatment: 40 Vital Signs Height(in): 68 Pulse(bpm): 65 Weight(lbs): Blood Pressure 136/50 (mmHg): Body Mass Index(BMI): Temperature(F): 98.0 Respiratory Rate 20 (breaths/min): Photos: [5:No Photos] [N/A:N/A] Wound Location: [5:Right Lower Leg - Medial] [N/A:N/A] Wounding Event: [5:Gradually Appeared] [N/A:N/A] Primary Etiology: [5:Venous Leg Ulcer] [N/A:N/A] Comorbid History: [5:Cataracts, Anemia, Hypertension, Osteoarthritis] [N/A:N/A] Date Acquired: [5:11/13/2014] [N/A:N/A] Weeks of Treatment: [5:40] [N/A:N/A] Wound Status: [5:Open] [N/A:N/A] Measurements L x W x D 0.2x0.1x0.1 [N/A:N/A] (cm) Area (cm) : [5:0.016] [N/A:N/A] Volume (cm) : [5:0.002] [N/A:N/A] % Reduction in Area: [5:100.00%] [N/A:N/A] % Reduction in Volume: 100.00% [N/A:N/A] Classification: [5:Full Thickness Without Exposed Support  Structures] [N/A:N/A] Exudate Amount: [5:Medium] [N/A:N/A] Exudate Type: [5:Serosanguineous] [N/A:N/A] Exudate Color: [5:red, brown] [N/A:N/A] Wound Margin: [5:Distinct, outline attached] [N/A:N/A] Granulation Amount: [5:Large (67-100%)] [N/A:N/A] Granulation Quality: [5:Pink, Pale] [N/A:N/A] Necrotic Amount: [5:None Present (0%)] [N/A:N/A] Exposed Structures: [5:Fascia: No Fat: No Tendon: No Muscle: No] [N/A:N/A] Joint: No Bone: No Limited to Skin Breakdown Epithelialization: Medium (34-66%) N/A N/A Periwound Skin Texture: Edema: No N/A N/A Excoriation: No Induration: No Callus: No Crepitus: No Fluctuance: No Friable: No Rash: No Scarring: No Periwound Skin Moist: Yes N/A N/A Moisture: Maceration: No Dry/Scaly: No Periwound Skin Color: Atrophie Blanche: No N/A N/A Cyanosis: No Ecchymosis: No Erythema: No Hemosiderin Staining: No Mottled: No Pallor: No Rubor: No Temperature: No Abnormality N/A N/A Tenderness on No N/A  N/A Palpation: Wound Preparation: Ulcer Cleansing: Other: N/A N/A soap and water Topical Anesthetic Applied: None Treatment Notes Electronic Signature(s) Signed: 10/24/2015 5:12:38 PM By: Curtis Sites Entered By: Curtis Sites on 10/24/2015 13:20:38 [name] ([id number]) -------------------------------------------------------------------------------- Multi-Disciplinary Care Plan Details Patient Name: [name], [name] Date of Service: 10/24/2015 12:45 PM Medical Record Number: [id number] Patient Account Number: [id number] Date of Birth/Sex: 30-Jun-1936 (80 y.o. Male) Treating RN: Curtis Sites Primary Care Physician: Dorothey Baseman Other Clinician: Referring Physician: Dorothey Baseman Treating Physician/Extender: Rudene Re in Treatment: 40 Active Inactive Orientation to the Wound Care Program Nursing Diagnoses: Knowledge deficit related to the wound healing center program Goals: Patient/caregiver will verbalize understanding of the Wound Healing Center Program Date Initiated: 01/15/2015 Goal Status: Active Interventions: Provide education on orientation to the wound center Notes: Venous Leg Ulcer Nursing Diagnoses: Knowledge deficit related to disease process and management Potential for venous Insuffiency (use before diagnosis confirmed) Goals: Patient will maintain optimal edema control Date Initiated: 01/15/2015 Goal Status: Active Patient/caregiver will verbalize understanding of disease process and disease management Date Initiated: 01/15/2015 Goal Status: Active Verify adequate tissue perfusion prior to therapeutic compression application Date Initiated: 01/15/2015 Goal Status: Active Interventions: Assess peripheral edema status every visit. Compression as ordered Provide education on venous insufficiency [name], [name] ([id number]) Treatment Activities: Test ordered outside of clinic : 10/24/2015 Therapeutic compression applied :  10/24/2015 Venous Duplex Doppler : 10/24/2015 Notes: Wound/Skin Impairment Nursing Diagnoses: Impaired tissue integrity Knowledge deficit related to ulceration/compromised skin integrity Goals: Patient/caregiver will verbalize understanding of skin care regimen Date Initiated: 01/15/2015 Goal Status: Active Ulcer/skin breakdown will have a volume reduction of 30% by week 4 Date Initiated: 01/15/2015 Goal Status: Active Ulcer/skin breakdown will have a volume reduction of 50% by week 8 Date Initiated: 01/15/2015 Goal Status: Active Ulcer/skin breakdown will have a volume reduction of 80% by week 12 Date Initiated: 01/15/2015 Goal Status: Active Ulcer/skin breakdown will heal within 14 weeks Date Initiated: 01/15/2015 Goal Status: Active Interventions: Assess patient/caregiver ability to perform ulcer/skin care regimen upon admission and as needed Assess ulceration(s) every visit Provide education on ulcer and skin care Notes: Electronic Signature(s) Signed: 10/24/2015 5:12:38 PM By: Curtis Sites Entered By: Curtis Sites on 10/24/2015 13:20:29 [name] ([id number]) -------------------------------------------------------------------------------- Patient/Caregiver Education Details Patient Name: [name] Date of Service: 10/24/2015 12:45 PM Medical Record Number: [id number] Patient Account Number: [id number] Date of Birth/Gender: 25-Nov-1935 (80 y.o. Male) Treating RN: Curtis Sites Primary Care Physician: Dorothey Baseman Other Clinician: Referring Physician: Dorothey Baseman Treating Physician/Extender: Rudene Re in Treatment: 40 Education Assessment Education Provided To: Patient Education Topics Provided Venous: Handouts: Other: be sure to bring compression hose next visit Methods: Explain/Verbal Responses: State content correctly Electronic Signature(s) Signed: 10/24/2015 2:06:08 PM By: Francesco Sor,  Mardene Celeste Entered By: Curtis Sites on  10/24/2015 14:06:08 [name] ([id number]) -------------------------------------------------------------------------------- Wound Assessment Details Patient Name: [name], [name] Date of Service: 10/24/2015 12:45 PM Medical Record Number: [id number] Patient Account Number: [id number] Date of Birth/Sex: 1935-11-25 (80 y.o. Male) Treating RN: Curtis Sites Primary Care Physician: Terance Hart, DAVID Other Clinician: Referring Physician: Terance Hart, DAVID Treating Physician/Extender: Rudene Re in Treatment: 40 Wound Status Wound Number: 5 Primary Venous Leg Ulcer Etiology: Wound Location: Right Lower Leg - Medial Wound Status: Open Wounding Event: Gradually Appeared Comorbid Cataracts, Anemia, Hypertension, Date Acquired: 11/13/2014 History: Osteoarthritis Weeks Of Treatment: 40 Clustered Wound: No Photos Photo Uploaded By: Curtis Sites on 10/24/2015 16:05:38 Wound Measurements Length: (cm) 0.2 Width: (cm) 0.1 Depth: (cm) 0.1 Area: (cm) 0.016 Volume: (cm) 0.002 % Reduction in Area: 100% % Reduction in Volume: 100% Epithelialization: Medium (34-66%) Tunneling: No Undermining: No Wound Description Full Thickness Without Exposed Classification: Support Structures Wound Margin: Distinct, outline attached Exudate Medium Amount: Exudate Type: Serosanguineous Exudate Color: red, brown Foul Odor After Cleansing: No Wound Bed Granulation Amount: Large (67-100%) Exposed Structure Granulation Quality: Pink, Pale Fascia Exposed: No Necrotic Amount: None Present (0%) Fat Layer Exposed: No [name], [name] ([id number]) Tendon Exposed: No Muscle Exposed: No Joint Exposed: No Bone Exposed: No Limited to Skin Breakdown Periwound Skin Texture Texture Color No Abnormalities Noted: No No Abnormalities Noted: No Callus: No Atrophie Blanche: No Crepitus: No Cyanosis: No Excoriation: No Ecchymosis: No Fluctuance: No Erythema: No Friable:  No Hemosiderin Staining: No Induration: No Mottled: No Localized Edema: No Pallor: No Rash: No Rubor: No Scarring: No Temperature / Pain Moisture Temperature: No Abnormality No Abnormalities Noted: No Dry / Scaly: No Maceration: No Moist: Yes Wound Preparation Ulcer Cleansing: Other: soap and water, Topical Anesthetic Applied: None Treatment Notes Wound #5 (Right, Medial Lower Leg) 1. Cleansed with: Cleanse wound with antibacterial soap and water 4. Dressing Applied: Mepitel Other dressing (specify in notes) 5. Secondary Dressing Applied ABD Pad 7. Secured with 4-Layer Compression System - Right Lower Extremity Notes collagen dressing applied by Dr. Meyer Russel today Electronic Signature(s) Signed: 10/24/2015 5:12:38 PM By: Curtis Sites Entered By: Curtis Sites on 10/24/2015 13:10:37 [name], [name] ([id number]) [name], [name] ([id number]) -------------------------------------------------------------------------------- Vitals Details Patient Name: [name], [name] Date of Service: 10/24/2015 12:45 PM Medical Record Number: [id number] Patient Account Number: [id number] Date of Birth/Sex: Aug 06, 1935 (80 y.o. Male) Treating RN: Curtis Sites Primary Care Physician: Terance Hart, DAVID Other Clinician: Referring Physician: Terance Hart, DAVID Treating Physician/Extender: Rudene Re in Treatment: 40 Vital Signs Time Taken: 13:01 Temperature (F): 98.0 Height (in): 68 Pulse (bpm): 65 Respiratory Rate (breaths/min): 20 Blood Pressure (mmHg): 136/50 Reference Range: 80 - 120 mg / dl Electronic Signature(s) Signed: 10/24/2015 5:12:38 PM By: Curtis Sites Entered By: Curtis Sites on 10/24/2015 13:02:02

## 2015-10-25 NOTE — Progress Notes (Signed)
BRAX, WALEN ([id number]) Visit Report for 10/24/2015 Chief Complaint Document Details Patient Name: [name], [name] Date of Service: 10/24/2015 12:45 PM Medical Record Number: [id number] Patient Account Number: [id number] Date of Birth/Sex: 04-04-1936 (80 y.o. Male) Treating RN: Curtis Sites Primary Care Physician: Dorothey Baseman Other Clinician: Referring Physician: Dorothey Baseman Treating Physician/Extender: Rudene Re in Treatment: 40 Information Obtained from: Patient Chief Complaint Patient presents for treatment of an open ulcer due to venous insufficiency. The patient has had a open wound on his right medial ankle for at least 3 years and was last seen in the wound clinic in February 2015. He was lost to follow-up after that. Electronic Signature(s) Signed: 10/24/2015 1:30:38 PM By: Evlyn Kanner MD, FACS Entered By: Evlyn Kanner on 10/24/2015 13:30:38 [name], [name] ([id number]) -------------------------------------------------------------------------------- HPI Details Patient Name: [name], [name] Date of Service: 10/24/2015 12:45 PM Medical Record Number: [id number] Patient Account Number: [id number] Date of Birth/Sex: 06-16-1936 (80 y.o. Male) Treating RN: Curtis Sites Primary Care Physician: Dorothey Baseman Other Clinician: Referring Physician: Terance Hart, DAVID Treating Physician/Extender: Rudene Re in Treatment: 40 History of Present Illness Location: right lower extremity ulceration Quality: Patient reports experiencing a dull pain to affected area(s). Severity: Patient states wound are getting worse. 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 Modifying Factors: Consults to this date include:has received treatment in the wound center in the past but stopped coming here since February 2015 Associated Signs and Symptoms: Patient reports having difficulty standing for long periods. HPI Description: 80 year old male who is known to have progressive weakness and has been in a nursing home for a while has had chronic lower extremity edema both legs and a large open wound on the right lower extremity which is has at least for about 3-4 years. The patient was seen in the wound clinic before and has been treated until February 2015 when he was lost to follow-up. Past medical history is significant for hypertension, gout, venous stasis ulcers, Parkinson's [name], constipation. He's not been a diabetic but his last hemoglobin A1c was 6 in March 2015. There is documentation that he's had endovenous ablation of bilateral varicose veins but we have no documentation about this and this may be done at least 4-5 years ago. 01/22/2015 -- he has his vascular test is scheduled for this afternoon. 01/29/2015 -- the patient's vascular test was canceled last week because he was unable to get onto the examining bed at [facility]. His Unna's boot was not applied either and the patient had a dressing placed by home health. Today when his dressing was removed we found a lot of maggots in his right lower extremity. 02/07/2015 -- Was seen in the vascular office by the PA and she noted that a bilateral venous duplex study did not show any DVT, SVT or reflux bilaterally. The patient was advised to continue with Unna's boot and would at some stage to require graduated compression stockings of the 20-30 mmHg variety. In the future lymphedema pumps would also be considered. 02/14/2015 -- his dressing is smelling quite a bit and there is a discoloration of the wound suggestive of an infection. Deep tissue cultures will be taken today. 02/21/2015 --  the culture  is back and it has growth moderate growth of Proteus and gram-negative rods sensitive to sulfa, ampicillin, cephazolin, Zosyn. He is already on doxycycline and his wound is looking clean and hence we will continue with this. 02/28/2015 -- he's been doing fine still on his antibiotics and has no fresh issues. 03/07/2015 -- I was told that because he lives in a nursing home the Apligraf was denied by his insurance company. 03/14/2015 -- we are awaiting samples and a trial of Puraply to be tried for his ulceration. 03/21/2015 -- his Apligraf was approved and he is here for his first application of Apligraf 03/28/2015 -- his wound has had quite a bit of secretion and needs to be changed and hence I will use the LEMON, STERNBERG. ([id number]) second application of Apligraf. 04/04/2015 -- he is here for a wound check but has a lot of secretions and hence his dressing needs to be taken down. 04/09/2015 -- he is here for his third application of Apligraf 04/18/2015 -- he is here for a wound check and though he does not have any overt infection he always has a foul odor to his leg. 04/25/2015 -- his wound has been doing much better and he has minimal drainage and the size is smaller. Of note he is on doxycycline. 05/02/2015 -- he is here for his fourth application of Apligraf 05/09/2015 -- he is here for a wound check and is bothered by a lot of odor from the wound. 05/16/2015 -- he has done very well, the odor and drainage is less with the wound dressing having been changed twice this week. He is here for his last application of Apligraf. 06/20/2015 -- an area superior to the previous wound has now opened up and this is a fairly superficial ulceration. 07/12/2015 -- he is here for his first application of Puraply. This is a sample and this is no charge. 07/18/2015 -- he is here for his second application of Puraply. This is a sample and this is no charge. 07/25/2015 -- he is here for his third  application of Puraply. This is a sample and this is no charge. 08/01/2015 -- he is here for his fourth application of Puraply. This is a sample and this is no charge. 08/08/2015 -- he is here for his fifth application of Puraply and this is a sample at no charge. 08/15/2015 -- he is here for his sixth application of Puraply and this is a sample at no charge. 08/29/2015 -- he is here for his eight application of Puraply and this is a sample at no charge. 09/05/2015 -- he is here for his nineth application of Puraply and this is a sample at no charge. 09/19/2015 -- this is the second week of using the collagen substitute and he is doing very well. 09/26/2015 -- this is the third week of using the collagen substitute. 10/03/2015 -- this is the fourth week of using the collagen substitute. 10/24/2015 --this is the seventh week of using the collagen substitute. Electronic Signature(s) Signed: 10/24/2015 1:32:23 PM By: Evlyn Kanner MD, FACS Entered By: Evlyn Kanner on 10/24/2015 13:32:23 [name], [name] ([id number]) -------------------------------------------------------------------------------- Physical Exam Details Patient Name: [name], [name] Date of Service: 10/24/2015 12:45 PM Medical Record Number: [id number] Patient Account Number: [id number] Date of Birth/Sex: 20-Apr-1936 (80 y.o. Male) Treating RN: Curtis Sites Primary Care Physician: Dorothey Baseman Other Clinician: Referring Physician: Terance Hart, DAVID Treating Physician/Extender: Rudene Re in Treatment: 40 Constitutional . Pulse regular. Respirations normal  and unlabored. Afebrile. . Eyes Nonicteric. Reactive to light. Ears, Nose, Mouth, and Throat Lips, teeth, and gums WNL.Marland Kitchen Moist mucosa without lesions. Neck supple and nontender. No palpable supraclavicular or cervical adenopathy. Normal sized without goiter. Respiratory WNL. No retractions.. Cardiovascular Pedal Pulses WNL. No clubbing, cyanosis or  edema. Lymphatic No adneopathy. No adenopathy. No adenopathy. Musculoskeletal Adexa without tenderness or enlargement.. Digits and nails w/o clubbing, cyanosis, infection, petechiae, ischemia, or inflammatory conditions.. Integumentary (Hair, Skin) No suspicious lesions. No crepitus or fluctuance. No peri-wound warmth or erythema. No masses.Marland Kitchen Psychiatric Judgement and insight Intact.. No evidence of depression, anxiety, or agitation.. Notes the wound is looking excellent and come down to a small area with healthy epithelization all over. Electronic Signature(s) Signed: 10/24/2015 1:32:59 PM By: Evlyn Kanner MD, FACS Entered By: Evlyn Kanner on 10/24/2015 13:32:58 JOCSAN, MCGINLEY ([id number]) -------------------------------------------------------------------------------- Physician Orders Details Patient Name: [name], [name] Date of Service: 10/24/2015 12:45 PM Medical Record Number: [id number] Patient Account Number: [id number] Date of Birth/Sex: 04/10/1936 (80 y.o. Male) Treating RN: Curtis Sites Primary Care Physician: Dorothey Baseman Other Clinician: Referring Physician: Dorothey Baseman Treating Physician/Extender: Rudene Re in Treatment: 54 Verbal / Phone Orders: Yes Clinician: Curtis Sites Read Back and Verified: Yes Diagnosis Coding Wound Cleansing Wound #5 Right,Medial Lower Leg o Cleanse wound with mild soap and water o No tub bath. Skin Barriers/Peri-Wound Care Wound #5 Right,Medial Lower Leg o Moisturizing lotion Primary Wound Dressing Wound #5 Right,Medial Lower Leg o Other: - collagen dressing applied by Dr Meyer Russel today in clinic Secondary Dressing Wound #5 Right,Medial Lower Leg o ABD pad - Carboflex and ABD; gauze to bolster o Mepitel One - over collagen Dressing Change Frequency Wound #5 Right,Medial Lower Leg o Change dressing every week Follow-up Appointments Wound #5 Right,Medial Lower Leg o Return Appointment in  1 week. Edema Control Wound #5 Right,Medial Lower Leg o 4-Layer Compression System - Right Lower Extremity o Other: - SNF to provide patient with bilateral 20-41mmHg compression hose to be worn daily. Start left leg now and right leg once wound heals. This is to be worn EVERY day so multiple pairs should be provided. Electronic Signature(s) [name], [name] ([id number]) Signed: 10/24/2015 4:14:45 PM By: Evlyn Kanner MD, FACS Signed: 10/24/2015 5:12:38 PM By: Curtis Sites Entered By: Curtis Sites on 10/24/2015 13:21:04 [name], [name] ([id number]) -------------------------------------------------------------------------------- Problem List Details Patient Name: [name], [name] Date of Service: 10/24/2015 12:45 PM Medical Record Number: [id number] Patient Account Number: [id number] Date of Birth/Sex: 10/30/1935 (80 y.o. 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 Date of Service: 10/24/2015 12:45 PM Medical Record Number: [id number] Patient Account Number: [id number] Date of Birth/Sex: 1935-11-23 (80 y.o.  Male) Treating RN: Curtis Sites Primary Care Physician: Dorothey Baseman Other Clinician: Referring Physician: Dorothey Baseman Treating Physician/Extender: Rudene Re in Treatment: 40 Subjective Chief Complaint Information obtained from Patient Patient presents for treatment of an open ulcer  due to venous insufficiency. The patient has had a open wound on his right medial ankle for at least 3 years and was last seen in the wound clinic in February 2015. He was lost to follow-up after that. History of Present Illness (HPI) The following HPI elements were documented for the patient's wound: Location: right lower extremity ulceration Quality: Patient reports experiencing a dull pain to affected area(s). Severity: Patient states wound are getting worse. 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 Modifying Factors: Consults to this date include:has received treatment in the wound center in the past but stopped coming here since February 2015 Associated Signs and Symptoms: Patient reports having difficulty standing for long periods. 80 year old male who is known to have progressive weakness and has been in a nursing home for a while has had chronic lower extremity edema both legs and a large open wound on the right lower extremity which is has at least for about 3-4 years. The patient was seen in the wound clinic before and has been treated until February 2015 when he was lost to follow-up. Past medical history is significant for hypertension, gout, venous stasis ulcers, Parkinson's [name], constipation. He's not been a diabetic but his last hemoglobin A1c was 6 in March 2015. There is  documentation that he's had endovenous ablation of bilateral varicose veins but we have no documentation about this and this may be done at least 4-5 years ago. 01/22/2015 -- he has his vascular test is scheduled for this afternoon. 01/29/2015 -- the patient's vascular test was canceled last week because he was unable to get onto the examining bed at [facility]. His Unna's boot was not applied either and the patient had a dressing placed by home health. Today when his dressing was removed we found a lot of maggots in his right lower extremity. 02/07/2015 -- Was seen in the vascular office by the PA and she noted that a bilateral venous duplex study did not show any DVT, SVT or reflux bilaterally. The patient was advised to continue with Unna's boot and would at some stage to require graduated compression stockings of the 20-30 mmHg variety. In the future lymphedema pumps would also be considered. 02/14/2015 -- his dressing is smelling quite a bit and there is a discoloration of the wound suggestive of an [name], [name] ([id number]) infection. Deep tissue cultures will be taken today. 02/21/2015 -- the culture is back and it has growth moderate growth of Proteus and gram-negative rods sensitive to sulfa, ampicillin, cephazolin, Zosyn. He is already on doxycycline and his wound is looking clean and hence we will continue with this. 02/28/2015 -- he's been doing fine still on his antibiotics and has no fresh issues. 03/07/2015 -- I was told that because he lives in a nursing home the Apligraf was denied by his insurance company. 03/14/2015 -- we are awaiting samples and a trial of Puraply to be tried for his ulceration. 03/21/2015 -- his Apligraf was approved and he is here for his first application of Apligraf 03/28/2015 -- his wound has had quite a bit of secretion and needs to be changed and hence I will use the second application of Apligraf. 04/04/2015 --  he is here for a wound check but has a  lot of secretions and hence his dressing needs to be taken down. 04/09/2015 -- he is here for his third application of Apligraf 04/18/2015 -- he is here for a wound check and though he does not have any overt infection he always has a foul odor to his leg. 04/25/2015 -- his wound has been doing much better and he has minimal drainage and the size is smaller. Of note he is on doxycycline. 05/02/2015 -- he is here for his fourth application of Apligraf 05/09/2015 -- he is here for a wound check and is bothered by a lot of odor from the wound. 05/16/2015 -- he has done very well, the odor and drainage is less with the wound dressing having been changed twice this week. He is here for his last application of Apligraf. 06/20/2015 -- an area superior to the previous wound has now opened up and this is a fairly superficial ulceration. 07/12/2015 -- he is here for his first application of Puraply. This is a sample and this is no charge. 07/18/2015 -- he is here for his second application of Puraply. This is a sample and this is no charge. 07/25/2015 -- he is here for his third application of Puraply. This is a sample and this is no charge. 08/01/2015 -- he is here for his fourth application of Puraply. This is a sample and this is no charge. 08/08/2015 -- he is here for his fifth application of Puraply and this is a sample at no charge. 08/15/2015 -- he is here for his sixth application of Puraply and this is a sample at no charge. 08/29/2015 -- he is here for his eight application of Puraply and this is a sample at no charge. 09/05/2015 -- he is here for his nineth application of Puraply and this is a sample at no charge. 09/19/2015 -- this is the second week of using the collagen substitute and he is doing very well. 09/26/2015 -- this is the third week of using the collagen substitute. 10/03/2015 -- this is the fourth week of using the collagen substitute. 10/24/2015 --this is the seventh week of  using the collagen substitute. Objective Constitutional Pulse regular. Respirations normal and unlabored. Afebrile. [name], [name] ([id number]) Vitals Time Taken: 1:01 PM, Height: 68 in, Temperature: 98.0 F, Pulse: 65 bpm, Respiratory Rate: 20 breaths/min, Blood Pressure: 136/50 mmHg. Eyes Nonicteric. Reactive to light. Ears, Nose, Mouth, and Throat Lips, teeth, and gums WNL.Marland Kitchen Moist mucosa without lesions. Neck supple and nontender. No palpable supraclavicular or cervical adenopathy. Normal sized without goiter. Respiratory WNL. No retractions.. Cardiovascular Pedal Pulses WNL. No clubbing, cyanosis or edema. Lymphatic No adneopathy. No adenopathy. No adenopathy. Musculoskeletal Adexa without tenderness or enlargement.. Digits and nails w/o clubbing, cyanosis, infection, petechiae, ischemia, or inflammatory conditions.Marland Kitchen Psychiatric Judgement and insight Intact.. No evidence of depression, anxiety, or agitation.. General Notes: the wound is looking excellent and come down to a small area with healthy epithelization all over. Integumentary (Hair, Skin) No suspicious lesions. No crepitus or fluctuance. No peri-wound warmth or erythema. No masses.. Wound #5 status is Open. Original cause of wound was Gradually Appeared. The wound is located on the Right,Medial Lower Leg. The wound measures 0.2cm length x 0.1cm width x 0.1cm depth; 0.016cm^2 area and 0.002cm^3 volume. The wound is limited to skin breakdown. There is no tunneling or undermining noted. There is a medium amount of serosanguineous drainage noted. The wound margin is distinct with the  outline attached to the wound base. There is large (67-100%) pink, pale granulation within the wound bed. There is no necrotic tissue within the wound bed. The periwound skin appearance exhibited: Moist. The periwound skin appearance did not exhibit: Callus, Crepitus, Excoriation, Fluctuance, Friable, Induration, Localized Edema, Rash,  Scarring, Dry/Scaly, Maceration, Atrophie Blanche, Cyanosis, Ecchymosis, Hemosiderin Staining, Mottled, Pallor, Rubor, Erythema. Periwound temperature was noted as No Abnormality. 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 Skin Barriers/Peri-Wound Care: Wound #5 Right,Medial Lower Leg: Moisturizing lotion Primary Wound Dressing: Wound #5 Right,Medial Lower Leg: Other: - collagen dressing applied by Dr Meyer Russel today in clinic Secondary Dressing: Wound #5 Right,Medial Lower Leg: ABD pad - Carboflex and ABD; gauze to bolster Mepitel One - over collagen Dressing Change Frequency: Wound #5 Right,Medial Lower Leg: Change dressing every week Follow-up Appointments: Wound #5 Right,Medial Lower Leg: Return Appointment in 1 week. Edema Control: Wound #5 Right,Medial Lower Leg: 4-Layer Compression System - Right Lower Extremity Other: - SNF to provide patient with bilateral 20-69mmHg compression hose to be worn daily. Start left leg now and right leg once wound heals. This is to be worn EVERY day so multiple pairs should be provided. I will use the remaining collagen we have with a 4-layer Profore wrap.In anticipation of his wound healing soon, we have measured him up for some compression stockings both of the pull up and the Velcro variety BERTON, BUTRICK ([id number]) Electronic Signature(s) Signed: 10/24/2015 1:33:44 PM By: Evlyn Kanner MD, FACS Entered By: Evlyn Kanner on 10/24/2015 13:33:44 Holbert, Caples Chrissie Noa  ([id number]) -------------------------------------------------------------------------------- SuperBill Details Patient Name: [name] Date of Service: 10/24/2015 Medical Record Number: [id number] Patient Account Number: [id number] Date of Birth/Sex: August 07, 1935 (80 y.o. 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 E66.01 Morbid (severe) obesity due to excess calories I89.0 Lymphedema, not elsewhere classified Physician Procedures CPT4 Code Description: [id number] - WC PHYS LEVEL 3 - EST PT ICD-10 Description Diagnosis I87.311 Chronic venous hypertension (idiopathic) with ulcer of 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 I89.0 Lymphedema, not elsewhere  classified E66.01 Morbid (severe) obesity due to excess calories Modifier: right lower s of muscle Quantity: 1 extremity Electronic Signature(s) Signed: 10/24/2015 1:34:03 PM By: Evlyn Kanner MD, FACS Entered By: Evlyn Kanner on 10/24/2015 13:34:03

## 2015-10-31 ENCOUNTER — Encounter: Payer: Medicare Other | Admitting: Surgery

## 2015-10-31 DIAGNOSIS — I87311 Chronic venous hypertension (idiopathic) with ulcer of right lower extremity: Secondary | ICD-10-CM | POA: Diagnosis not present

## 2015-10-31 NOTE — Progress Notes (Addendum)
KAINALU, HEGGS ([id number]) Visit Report for 10/31/2015 Chief Complaint Document Details Patient Name: [name], [name] Date of Service: 10/31/2015 3:00 PM Medical Record Number: [id number] Patient Account Number: [id number] Date of Birth/Sex: Mar 10, 1936 (80 y.o. Male) Treating RN: Curtis Sites Primary Care Physician: Dorothey Baseman Other Clinician: Referring Physician: Dorothey Baseman Treating Physician/Extender: Rudene Re in Treatment: 69 Information Obtained from: Patient Chief Complaint Patient presents for treatment of an open ulcer due to venous insufficiency. The patient has had a open wound on his right medial ankle for at least 3 years and was last seen in the wound clinic in February 2015. He was lost to follow-up after that. Electronic Signature(s) Signed: 10/31/2015 3:57:22 PM By: Evlyn Kanner MD, FACS Entered By: Evlyn Kanner on 10/31/2015 15:57:22 Haadi, Santellan Chrissie Noa ([id number]) -------------------------------------------------------------------------------- HPI Details Patient Name: [name], [name] Date of Service: 10/31/2015 3:00 PM Medical Record Number: [id number] Patient Account Number: [id number] Date of Birth/Sex: [birth year] (80 y.o. Male) Treating RN: Curtis Sites Primary Care Physician: Dorothey Baseman Other Clinician: Referring Physician: Terance Hart, DAVID Treating Physician/Extender: Rudene Re in Treatment: 21 History of Present Illness Location: right lower extremity ulceration Quality: Patient reports experiencing a dull pain to affected area(s). Severity: Patient states wound are getting worse. 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 Modifying Factors: Consults to this date include:has received treatment in the wound center in the past but stopped coming here since February 2015 Associated Signs and Symptoms: Patient reports having difficulty standing for long periods. HPI Description: 80 year old male who is known to have progressive weakness and has been in a nursing home for a while has had chronic lower extremity edema both legs and a large open wound on the right lower extremity which is has at least for about 3-4 years. The patient was seen in the wound clinic before and has been treated until February 2015 when he was lost to follow-up. Past medical history is significant for hypertension, gout, venous stasis ulcers, Parkinson's [name], constipation. He's not been a diabetic but his last hemoglobin A1c was 6 in March 2015. There is documentation that he's had endovenous ablation of bilateral varicose veins but we have no documentation about this and this may be done at least 4-5 years ago. 01/22/2015 -- he has his vascular test is scheduled for this afternoon. 01/29/2015 -- the patient's vascular test was canceled last week because he was unable to get onto the examining bed at [facility]. His Unna's boot was not applied either and the patient had a dressing placed by home health. Today when his dressing was removed we found a lot of maggots in his right lower extremity. 02/07/2015 -- Was seen in the vascular office by the PA and she noted that a bilateral venous duplex study did not show any DVT, SVT or reflux bilaterally. The patient was advised to continue with Unna's boot and would at some stage to require graduated compression stockings of the 20-30 mmHg variety. In the future lymphedema pumps would also be considered. 02/14/2015 -- his dressing is smelling quite a bit and there is a discoloration of the wound suggestive of an infection. Deep tissue cultures will be taken today. 02/21/2015 --  the culture  is back and it has growth moderate growth of Proteus and gram-negative rods sensitive to sulfa, ampicillin, cephazolin, Zosyn. He is already on doxycycline and his wound is looking clean and hence we will continue with this. 02/28/2015 -- he's been doing fine still on his antibiotics and has no fresh issues. 03/07/2015 -- I was told that because he lives in a nursing home the Apligraf was denied by his insurance company. 03/14/2015 -- we are awaiting samples and a trial of Puraply to be tried for his ulceration. 03/21/2015 -- his Apligraf was approved and he is here for his first application of Apligraf 03/28/2015 -- his wound has had quite a bit of secretion and needs to be changed and hence I will use the HARBERT, DEMICHELE. ([id number]) second application of Apligraf. 04/04/2015 -- he is here for a wound check but has a lot of secretions and hence his dressing needs to be taken down. 04/09/2015 -- he is here for his third application of Apligraf 04/18/2015 -- he is here for a wound check and though he does not have any overt infection he always has a foul odor to his leg. 04/25/2015 -- his wound has been doing much better and he has minimal drainage and the size is smaller. Of note he is on doxycycline. 05/02/2015 -- he is here for his fourth application of Apligraf 05/09/2015 -- he is here for a wound check and is bothered by a lot of odor from the wound. 05/16/2015 -- he has done very well, the odor and drainage is less with the wound dressing having been changed twice this week. He is here for his last application of Apligraf. 06/20/2015 -- an area superior to the previous wound has now opened up and this is a fairly superficial ulceration. 07/12/2015 -- he is here for his first application of Puraply. This is a sample and this is no charge. 07/18/2015 -- he is here for his second application of Puraply. This is a sample and this is no charge. 07/25/2015 -- he is here for his third  application of Puraply. This is a sample and this is no charge. 08/01/2015 -- he is here for his fourth application of Puraply. This is a sample and this is no charge. 08/08/2015 -- he is here for his fifth application of Puraply and this is a sample at no charge. 08/15/2015 -- he is here for his sixth application of Puraply and this is a sample at no charge. 08/29/2015 -- he is here for his eight application of Puraply and this is a sample at no charge. 09/05/2015 -- he is here for his nineth application of Puraply and this is a sample at no charge. 09/19/2015 -- this is the second week of using the collagen substitute and he is doing very well. 09/26/2015 -- this is the third week of using the collagen substitute. 10/03/2015 -- this is the fourth week of using the collagen substitute. 10/24/2015 --this is the seventh week of using the collagen substitute. Electronic Signature(s) Signed: 10/31/2015 3:57:27 PM By: Evlyn Kanner MD, FACS Entered By: Evlyn Kanner on 10/31/2015 15:57:27 Devlin, Alkire Chrissie Noa ([id number]) -------------------------------------------------------------------------------- Physical Exam Details Patient Name: [name], [name] Date of Service: 10/31/2015 3:00 PM Medical Record Number: [id number] Patient Account Number: [id number] Date of Birth/Sex: 12/14/1935 (80 y.o. Male) Treating RN: Curtis Sites Primary Care Physician: Dorothey Baseman Other Clinician: Referring Physician: Terance Hart, DAVID Treating Physician/Extender: Rudene Re in Treatment: 41 Constitutional . Pulse regular. Respirations normal  and unlabored. Afebrile. . Eyes Nonicteric. Reactive to light. Ears, Nose, Mouth, and Throat Lips, teeth, and gums WNL.Marland Kitchen Moist mucosa without lesions. Neck supple and nontender. No palpable supraclavicular or cervical adenopathy. Normal sized without goiter. Respiratory WNL. No retractions.. Breath sounds WNL, No rubs, rales, rhonchi, or  wheeze.. Cardiovascular Heart rhythm and rate regular, no murmur or gallop.. Pedal Pulses WNL. No clubbing, cyanosis or edema. Chest Breasts symmetical and no nipple discharge.. Breast tissue WNL, no masses, lumps, or tenderness.. Lymphatic No adneopathy. No adenopathy. No adenopathy. Musculoskeletal Adexa without tenderness or enlargement.. Digits and nails w/o clubbing, cyanosis, infection, petechiae, ischemia, or inflammatory conditions.. Integumentary (Hair, Skin) No suspicious lesions. No crepitus or fluctuance. No peri-wound warmth or erythema. No masses.Marland Kitchen Psychiatric Judgement and insight Intact.. No evidence of depression, anxiety, or agitation.. Notes the wound is looking excellent with a small area of about half a centimeter diameter which is still open. The rest of the surrounding area has got supple scar and there is no evidence of significant lymphedema. Electronic Signature(s) Signed: 10/31/2015 3:57:51 PM By: Evlyn Kanner MD, FACS Entered By: Evlyn Kanner on 10/31/2015 15:57:51 Justyn, Langham Chrissie Noa ([id number]) -------------------------------------------------------------------------------- Physician Orders Details Patient Name: CHADWICK, REISWIG. Date of Service: 10/31/2015 3:00 PM Medical Record Number: [id number] Patient Account Number: [id number] Date of Birth/Sex: 12-21-1935 (80 y.o. 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 Skin Barriers/Peri-Wound Care Wound #5 Right,Medial Lower Leg o Moisturizing lotion Primary Wound Dressing Wound #5 Right,Medial Lower Leg o Foam Secondary Dressing Wound #5 Right,Medial Lower Leg o ABD pad Dressing Change Frequency Wound #5 Right,Medial Lower Leg o Change dressing every week Follow-up Appointments Wound #5 Right,Medial Lower Leg o Return Appointment in 1 week. Edema Control Wound #5 Right,Medial Lower Leg BOAZ, BERISHA ([id number]) o 4-Layer Compression System - Right Lower Extremity o Other: - SNF to provide patient with bilateral 20-86mmHg compression hose to be worn daily (not 15- 20 mmHg compression). Start left leg now and right leg once wound heals. This is to be worn EVERY day so multiple pairs should be provided. Electronic Signature(s) Signed: 10/31/2015 4:28:22 PM By: Evlyn Kanner MD, FACS Signed: 10/31/2015 6:41:05 PM By: Curtis Sites Entered By: Curtis Sites on 10/31/2015 16:07:38 Taegan, Standage Chrissie Noa ([id number]) -------------------------------------------------------------------------------- Problem List Details Patient Name: [name], [name] Date of Service: 10/31/2015 3:00 PM Medical Record Number: [id number] Patient Account Number: [id number] Date of Birth/Sex: 1936/04/16 (80 y.o. 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 Date of Service: 10/31/2015 3:00 PM Medical Record Number: [id number] Patient Account Number: [id number] Date of Birth/Sex: 1936/05/26 (80 y.o. Male) Treating RN: Curtis Sites Primary Care Physician: Dorothey Baseman Other Clinician: Referring Physician: Dorothey Baseman Treating Physician/Extender: Rudene Re in Treatment: 52 Subjective Chief Complaint Information obtained from Patient Patient presents for treatment of an open ulcer due to venous insufficiency. The patient has had a open wound on his right medial ankle for at least 3 years and was last seen in the wound clinic in February 2015. He was lost to follow-up after that. History of Present Illness (HPI) The following HPI elements were documented for the patient's wound: Location: right lower extremity ulceration Quality: Patient reports experiencing a dull pain to affected area(s). Severity: Patient states wound are getting worse. 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 Modifying Factors: Consults to this date include:has received treatment in the wound center in the past but stopped coming here since February 2015 Associated Signs and Symptoms: Patient reports having difficulty standing for long periods. 80 year old male who is known to have progressive  weakness and has been in a nursing home for a while has had chronic lower extremity edema both legs and a large open wound on the right lower extremity which is has at least for about 3-4 years. The patient was seen in the wound clinic before and has been treated until February 2015 when he was lost to follow-up. Past medical history is significant for hypertension, gout, venous stasis ulcers, Parkinson's [name], constipation. He's not been a diabetic but his last hemoglobin A1c was 6 in March 2015. There is documentation that he's had endovenous ablation of bilateral varicose veins but we have no documentation about this and this may be done at least 4-5 years ago. 01/22/2015 -- he has his vascular test is scheduled for this afternoon. 01/29/2015 -- the patient's vascular test was canceled last week because he was unable to get onto the examining bed at [facility]. His Unna's boot was not applied either and the patient had a dressing placed by home health. Today when his dressing was removed we found a lot of maggots in his right lower extremity. 02/07/2015 -- Was seen in the vascular office by the PA and she noted that a bilateral venous duplex study did not show any DVT, SVT or reflux bilaterally. The patient was advised to continue with Unna's boot and would at some stage to require graduated compression stockings of the 20-30 mmHg variety. In the future lymphedema pumps would also be considered. 02/14/2015 -- his dressing is smelling quite a bit and there is a discoloration of the wound suggestive of an JUNE, VACHA. ([id number]) infection. Deep tissue cultures will be taken today. 02/21/2015 -- the culture is back and it has growth moderate growth of Proteus and gram-negative rods sensitive to sulfa, ampicillin, cephazolin, Zosyn. He is already on doxycycline and his wound is looking clean and hence we will continue with this. 02/28/2015 -- he's been doing fine still on his  antibiotics and has no fresh issues. 03/07/2015 -- I was told that  because he lives in a nursing home the Apligraf was denied by his insurance company. 03/14/2015 -- we are awaiting samples and a trial of Puraply to be tried for his ulceration. 03/21/2015 -- his Apligraf was approved and he is here for his first application of Apligraf 03/28/2015 -- his wound has had quite a bit of secretion and needs to be changed and hence I will use the second application of Apligraf. 04/04/2015 -- he is here for a wound check but has a lot of secretions and hence his dressing needs to be taken down. 04/09/2015 -- he is here for his third application of Apligraf 04/18/2015 -- he is here for a wound check and though he does not have any overt infection he always has a foul odor to his leg. 04/25/2015 -- his wound has been doing much better and he has minimal drainage and the size is smaller. Of note he is on doxycycline. 05/02/2015 -- he is here for his fourth application of Apligraf 05/09/2015 -- he is here for a wound check and is bothered by a lot of odor from the wound. 05/16/2015 -- he has done very well, the odor and drainage is less with the wound dressing having been changed twice this week. He is here for his last application of Apligraf. 06/20/2015 -- an area superior to the previous wound has now opened up and this is a fairly superficial ulceration. 07/12/2015 -- he is here for his first application of Puraply. This is a sample and this is no charge. 07/18/2015 -- he is here for his second application of Puraply. This is a sample and this is no charge. 07/25/2015 -- he is here for his third application of Puraply. This is a sample and this is no charge. 08/01/2015 -- he is here for his fourth application of Puraply. This is a sample and this is no charge. 08/08/2015 -- he is here for his fifth application of Puraply and this is a sample at no charge. 08/15/2015 -- he is here for his sixth  application of Puraply and this is a sample at no charge. 08/29/2015 -- he is here for his eight application of Puraply and this is a sample at no charge. 09/05/2015 -- he is here for his nineth application of Puraply and this is a sample at no charge. 09/19/2015 -- this is the second week of using the collagen substitute and he is doing very well. 09/26/2015 -- this is the third week of using the collagen substitute. 10/03/2015 -- this is the fourth week of using the collagen substitute. 10/24/2015 --this is the seventh week of using the collagen substitute. Objective Constitutional Pulse regular. Respirations normal and unlabored. Afebrile. MYRL, LAZARUS ([id number]) Vitals Time Taken: 3:32 PM, Height: 68 in, Pulse: 65 bpm, Respiratory Rate: 20 breaths/min, Blood Pressure: 137/62 mmHg. Eyes Nonicteric. Reactive to light. Ears, Nose, Mouth, and Throat Lips, teeth, and gums WNL.Marland Kitchen Moist mucosa without lesions. Neck supple and nontender. No palpable supraclavicular or cervical adenopathy. Normal sized without goiter. Respiratory WNL. No retractions.. Breath sounds WNL, No rubs, rales, rhonchi, or wheeze.. Cardiovascular Heart rhythm and rate regular, no murmur or gallop.. Pedal Pulses WNL. No clubbing, cyanosis or edema. Chest Breasts symmetical and no nipple discharge.. Breast tissue WNL, no masses, lumps, or tenderness.. Lymphatic No adneopathy. No adenopathy. No adenopathy. Musculoskeletal Adexa without tenderness or enlargement.. Digits and nails w/o clubbing, cyanosis, infection, petechiae, ischemia, or inflammatory conditions.Marland Kitchen Psychiatric Judgement and insight Intact.. No evidence of depression, anxiety,  or agitation.. General Notes: the wound is looking excellent with a small area of about half a centimeter diameter which is still open. The rest of the surrounding area has got supple scar and there is no evidence of significant lymphedema. Integumentary (Hair, Skin) No  suspicious lesions. No crepitus or fluctuance. No peri-wound warmth or erythema. No masses.. Wound #5 status is Open. Original cause of wound was Gradually Appeared. The wound is located on the Right,Medial Lower Leg. The wound measures 0.5cm length x 0.5cm width x 0.1cm depth; 0.196cm^2 area and 0.02cm^3 volume. The wound is limited to skin breakdown. There is no tunneling or undermining noted. There is a medium amount of serosanguineous drainage noted. The wound margin is distinct with the outline attached to the wound base. There is large (67-100%) pink, pale granulation within the wound bed. There is no necrotic tissue within the wound bed. The periwound skin appearance exhibited: Moist. The periwound skin appearance did not exhibit: Callus, Crepitus, Excoriation, Fluctuance, Friable, Induration, Localized Edema, Rash, Scarring, Dry/Scaly, Maceration, Atrophie Blanche, Cyanosis, Ecchymosis, Hemosiderin Staining, Mottled, Pallor, Rubor, Erythema. Periwound temperature was noted as No Abnormality. 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 Skin Barriers/Peri-Wound Care: Wound #5 Right,Medial Lower Leg: Moisturizing lotion Primary Wound Dressing: Wound #5 Right,Medial Lower Leg: Foam Secondary Dressing: Wound #5 Right,Medial Lower Leg: ABD pad Dressing Change Frequency: Wound #5 Right,Medial Lower Leg: Change dressing every week Follow-up Appointments: Wound #5 Right,Medial Lower Leg: Return Appointment in 1 week. Edema Control: Wound #5 Right,Medial Lower Leg: 4-Layer Compression System -  Right Lower Extremity Other: - SNF to provide patient with bilateral 20-54mmHg compression hose to be worn daily (not 15-20 mmHg compression). Start left leg now and right leg once wound heals. This is to be worn EVERY day so multiple pairs should be provided. CORRAN, LALONE ([id number]) after reviewing his wound today I have recommended we use a piece of foam with a 4-layer Profore and see him back next week. Anticipate discharge soon but he has not got the proper stockings with him and hence we have asked his nursing home to reorder them. Electronic Signature(s) Signed: 11/01/2015 12:41:36 PM By: Evlyn Kanner MD, FACS Previous Signature: 10/31/2015 3:58:27 PM Version By: Evlyn Kanner MD, FACS Entered By: Evlyn Kanner on 11/01/2015 12:41:36 Jolly, Bleicher Chrissie Noa ([id number]) -------------------------------------------------------------------------------- SuperBill Details Patient Name: [name], [name] Date of Service: 10/31/2015 Medical Record Number: [id number] Patient Account Number: [id number] Date of Birth/Sex: Oct 26, 1935 (80 y.o. 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 E66.01 Morbid (severe) obesity due to excess calories I89.0 Lymphedema, not elsewhere classified Facility Procedures CPT4: Description Modifier Quantity Code [id number] (Facility Use Only) [id number] - APPLY MULTLAY COMPRS LWR RT 1 LEG Physician Procedures CPT4 Code Description: [id number] - WC PHYS LEVEL 3 - EST PT ICD-10 Description Diagnosis I87.311 Chronic venous hypertension (idiopathic) with ulcer of L97.313 Non-pressure chronic ulcer of right ankle  with necrosi  E66.01 Morbid (severe) obesity due  to excess calories I89.0 Lymphedema, not elsewhere classified Modifier: right lower s of muscle Quantity: 1 extremity Electronic Signature(s) Signed: 10/31/2015 6:14:19 PM By: Curtis Sites Signed: 11/01/2015 12:43:21 PM By: Evlyn Kanner MD, FACS Previous Signature: 10/31/2015 3:58:42 PM Version By: Evlyn Kanner MD, FACS Entered By: Curtis Sites on 10/31/2015 18:14:19

## 2015-11-01 NOTE — Progress Notes (Signed)
RAIHAN, KIMMEL ([id number]) Visit Report for 10/31/2015 Arrival Information Details Patient Name: ROSWELL, NDIAYE. Date of Service: 10/31/2015 3:00 PM Medical Record Number: [id number] Patient Account Number: [id number] Date of Birth/Sex: Dec 01, 1935 (80 y.o. Male) Treating RN: Curtis Sites Primary Care Physician: Dorothey Baseman Other Clinician: Referring Physician: Terance Hart, DAVID Treating Physician/Extender: Rudene Re in Treatment: 40 Visit Information History Since Last Visit Added or deleted any medications: No Patient Arrived: Wheel Chair Any new allergies or adverse reactions: No Arrival Time: 15:31 Had a fall or experienced change in No activities of daily living that may affect Accompanied By: self risk of falls: Transfer Assistance: None Signs or symptoms of abuse/neglect since last No Patient Identification Verified: Yes visito Secondary Verification Process Yes Hospitalized since last visit: No Completed: Pain Present Now: No Patient Requires Transmission-Based No Precautions: Patient Has Alerts: No Electronic Signature(s) Signed: 10/31/2015 6:41:05 PM By: Curtis Sites Entered By: Curtis Sites on 10/31/2015 15:31:51 [name] ([id number]) -------------------------------------------------------------------------------- Encounter Discharge Information Details Patient Name: [name]. Date of Service: 10/31/2015 3:00 PM Medical Record Number: [id number] Patient Account Number: [id number] Date of Birth/Sex: [birth year] (80 y.o. Male) Treating RN: Curtis Sites Primary Care Physician: Dorothey Baseman Other Clinician: Referring Physician: Dorothey Baseman Treating Physician/Extender: Rudene Re in Treatment: 59 Encounter Discharge Information Items Discharge Pain Level: 0 Discharge Condition: Stable Ambulatory Status: Wheelchair Discharge Destination: Nursing Home Transportation: Private Auto Accompanied By:  self Schedule Follow-up Appointment: Yes Medication Reconciliation completed and provided to Patient/Care No Mayzie Caughlin: Provided on Clinical Summary of Care: 10/31/2015 Form Type Recipient Paper Patient [facility] Electronic Signature(s) Signed: 10/31/2015 6:15:05 PM By: Curtis Sites Previous Signature: 10/31/2015 4:09:32 PM Version By: Gwenlyn Perking Entered By: Curtis Sites on 10/31/2015 18:15:05 Aveer, [place] Chrissie Noa ([id number]) -------------------------------------------------------------------------------- Multi Wound Chart Details Patient Name: [name]. Date of Service: 10/31/2015 3:00 PM Medical Record Number: [id number] Patient Account Number: [id number] Date of Birth/Sex: Jul 22, 1935 (80 y.o. Male) Treating RN: Curtis Sites Primary Care Physician: Dorothey Baseman Other Clinician: Referring Physician: Terance Hart, DAVID Treating Physician/Extender: Rudene Re in Treatment: 41 Vital Signs Height(in): 68 Pulse(bpm): 65 Weight(lbs): Blood Pressure 137/62 (mmHg): Body Mass Index(BMI): Temperature(F): Respiratory Rate 20 (breaths/min): Photos: [5:No Photos] [N/A:N/A] Wound Location: [5:Right Lower Leg - Medial] [N/A:N/A] Wounding Event: [5:Gradually Appeared] [N/A:N/A] Primary Etiology: [5:Venous Leg Ulcer] [N/A:N/A] Comorbid History: [5:Cataracts, Anemia, Hypertension, Osteoarthritis] [N/A:N/A] Date Acquired: [5:11/13/2014] [N/A:N/A] Weeks of Treatment: [5:41] [N/A:N/A] Wound Status: [5:Open] [N/A:N/A] Measurements L x W x D 0.5x0.5x0.1 [N/A:N/A] (cm) Area (cm) : [5:0.196] [N/A:N/A] Volume (cm) : [5:0.02] [N/A:N/A] % Reduction in Area: [5:99.80%] [N/A:N/A] % Reduction in Volume: 99.90% [N/A:N/A] Classification: [5:Full Thickness Without Exposed Support Structures] [N/A:N/A] Exudate Amount: [5:Medium] [N/A:N/A] Exudate Type: [5:Serosanguineous] [N/A:N/A] Exudate Color: [5:red, brown] [N/A:N/A] Wound Margin: [5:Distinct, outline attached]  [N/A:N/A] Granulation Amount: [5:Large (67-100%)] [N/A:N/A] Granulation Quality: [5:Pink, Pale] [N/A:N/A] Necrotic Amount: [5:None Present (0%)] [N/A:N/A] Exposed Structures: [5:Fascia: No Fat: No Tendon: No Muscle: No] [N/A:N/A] Joint: No Bone: No Limited to Skin Breakdown Epithelialization: Medium (34-66%) N/A N/A Periwound Skin Texture: Edema: No N/A N/A Excoriation: No Induration: No Callus: No Crepitus: No Fluctuance: No Friable: No Rash: No Scarring: No Periwound Skin Moist: Yes N/A N/A Moisture: Maceration: No Dry/Scaly: No Periwound Skin Color: Atrophie Blanche: No N/A N/A Cyanosis: No Ecchymosis: No Erythema: No Hemosiderin Staining: No Mottled: No Pallor: No Rubor: No Temperature: No Abnormality N/A N/A Tenderness on No N/A N/A Palpation: Wound Preparation: Ulcer Cleansing: Other: N/A N/A soap and water Topical Anesthetic Applied: None Treatment Notes Electronic  Signature(s) Signed: 10/31/2015 6:13:59 PM By: Curtis Sites Entered By: Curtis Sites on 10/31/2015 18:13:59 Izmael, [place] Chrissie Noa ([id number]) -------------------------------------------------------------------------------- Multi-Disciplinary Care Plan Details Patient Name: KOEN, KRESSIN. Date of Service: 10/31/2015 3:00 PM Medical Record Number: [id number] Patient Account Number: [id number] Date of Birth/Sex: March 09, 1936 (80 y.o. Male) Treating RN: Curtis Sites Primary Care Physician: Dorothey Baseman Other Clinician: Referring Physician: Dorothey Baseman Treating Physician/Extender: Rudene Re in Treatment: 69 Active Inactive Orientation to the Wound Care Program Nursing Diagnoses: Knowledge deficit related to the wound healing center program Goals: Patient/caregiver will verbalize understanding of the Wound Healing Center Program Date Initiated: 01/15/2015 Goal Status: Active Interventions: Provide education on orientation to the wound center Notes: Venous Leg  Ulcer Nursing Diagnoses: Knowledge deficit related to disease process and management Potential for venous Insuffiency (use before diagnosis confirmed) Goals: Patient will maintain optimal edema control Date Initiated: 01/15/2015 Goal Status: Active Patient/caregiver will verbalize understanding of disease process and disease management Date Initiated: 01/15/2015 Goal Status: Active Verify adequate tissue perfusion prior to therapeutic compression application Date Initiated: 01/15/2015 Goal Status: Active Interventions: Assess peripheral edema status every visit. Compression as ordered Provide education on venous insufficiency BABAK, LACK ([id number]) Treatment Activities: Test ordered outside of clinic : 10/24/2015 Therapeutic compression applied : 10/24/2015 Venous Duplex Doppler : 10/24/2015 Notes: Wound/Skin Impairment Nursing Diagnoses: Impaired tissue integrity Knowledge deficit related to ulceration/compromised skin integrity Goals: Patient/caregiver will verbalize understanding of skin care regimen Date Initiated: 01/15/2015 Goal Status: Active Ulcer/skin breakdown will have a volume reduction of 30% by week 4 Date Initiated: 01/15/2015 Goal Status: Active Ulcer/skin breakdown will have a volume reduction of 50% by week 8 Date Initiated: 01/15/2015 Goal Status: Active Ulcer/skin breakdown will have a volume reduction of 80% by week 12 Date Initiated: 01/15/2015 Goal Status: Active Ulcer/skin breakdown will heal within 14 weeks Date Initiated: 01/15/2015 Goal Status: Active Interventions: Assess patient/caregiver ability to perform ulcer/skin care regimen upon admission and as needed Assess ulceration(s) every visit Provide education on ulcer and skin care Notes: Electronic Signature(s) Signed: 10/31/2015 6:13:23 PM By: Curtis Sites Entered By: Curtis Sites on 10/31/2015 18:13:23 [name]  ([id number]) -------------------------------------------------------------------------------- Patient/Caregiver Education Details Patient Name: [name]. Date of Service: 10/31/2015 3:00 PM Medical Record Number: [id number] Patient Account Number: [id number] Date of Birth/Gender: [birth year] (80 y.o. Male) Treating RN: Curtis Sites Primary Care Physician: Dorothey Baseman Other Clinician: Referring Physician: Dorothey Baseman Treating Physician/Extender: Rudene Re in Treatment: 4 Education Assessment Education Provided To: Patient Education Topics Provided Nutrition: Handouts: Other: low sodium for edema management Methods: Explain/Verbal Responses: State content correctly Electronic Signature(s) Signed: 10/31/2015 6:15:37 PM By: Curtis Sites Entered By: Curtis Sites on 10/31/2015 18:15:37 [name] ([id number]) -------------------------------------------------------------------------------- Wound Assessment Details Patient Name: [name]. Date of Service: 10/31/2015 3:00 PM Medical Record Number: [id number] Patient Account Number: [id number] Date of Birth/Sex: 01-21-1936 (80 y.o. Male) Treating RN: Curtis Sites Primary Care Physician: Dorothey Baseman Other Clinician: Referring Physician: Terance Hart, DAVID Treating Physician/Extender: Rudene Re in Treatment: 41 Wound Status Wound Number: 5 Primary Venous Leg Ulcer Etiology: Wound Location: Right Lower Leg - Medial Wound Status: Open Wounding Event: Gradually Appeared Comorbid Cataracts, Anemia, Hypertension, Date Acquired: 11/13/2014 History: Osteoarthritis Weeks Of Treatment: 41 Clustered Wound: No Photos Photo Uploaded By: Curtis Sites on 10/31/2015 18:20:21 Wound Measurements Length: (cm) 0.5 Width: (cm) 0.5 Depth: (cm) 0.1 Area: (cm) 0.196 Volume: (cm) 0.02 % Reduction in Area: 99.8% % Reduction in Volume: 99.9% Epithelialization: Medium  (34-66%) Tunneling: No Undermining: No  Wound Description Full Thickness Without Exposed Classification: Support Structures Wound Margin: Distinct, outline attached Exudate Medium Amount: Exudate Type: Serosanguineous Exudate Color: red, brown Foul Odor After Cleansing: No Wound Bed Granulation Amount: Large (67-100%) Exposed Structure Granulation Quality: Pink, Pale Fascia Exposed: No Necrotic Amount: None Present (0%) Fat Layer Exposed: No BUDDIE, MARSTON ([id number]) Tendon Exposed: No Muscle Exposed: No Joint Exposed: No Bone Exposed: No Limited to Skin Breakdown Periwound Skin Texture Texture Color No Abnormalities Noted: No No Abnormalities Noted: No Callus: No Atrophie Blanche: No Crepitus: No Cyanosis: No Excoriation: No Ecchymosis: No Fluctuance: No Erythema: No Friable: No Hemosiderin Staining: No Induration: No Mottled: No Localized Edema: No Pallor: No Rash: No Rubor: No Scarring: No Temperature / Pain Moisture Temperature: No Abnormality No Abnormalities Noted: No Dry / Scaly: No Maceration: No Moist: Yes Wound Preparation Ulcer Cleansing: Other: soap and water, Topical Anesthetic Applied: None Treatment Notes Wound #5 (Right, Medial Lower Leg) 1. Cleansed with: Cleanse wound with antibacterial soap and water 4. Dressing Applied: Foam 5. Secondary Dressing Applied ABD Pad 7. Secured with 4-Layer Compression System - Right Lower Extremity Electronic Signature(s) Signed: 10/31/2015 6:13:49 PM By: Curtis Sites Entered By: Curtis Sites on 10/31/2015 18:13:49 Durelle, Zepeda Chrissie Noa ([id number]) -------------------------------------------------------------------------------- Vitals Details Patient Name: [name]. Date of Service: 10/31/2015 3:00 PM Medical Record Number: [id number] Patient Account Number: [id number] Date of Birth/Sex: 09/15/1935 (80 y.o. Male) Treating RN: Curtis Sites Primary Care Physician: Dorothey Baseman Other Clinician: Referring Physician: Terance Hart, DAVID Treating Physician/Extender: Rudene Re in Treatment: 41 Vital Signs Time Taken: 15:32 Pulse (bpm): 65 Height (in): 68 Respiratory Rate (breaths/min): 20 Blood Pressure (mmHg): 137/62 Reference Range: 80 - 120 mg / dl Electronic Signature(s) Signed: 10/31/2015 6:41:05 PM By: Curtis Sites Entered By: Curtis Sites on 10/31/2015 15:32:38

## 2015-11-07 ENCOUNTER — Encounter: Payer: Medicare Other | Attending: Surgery | Admitting: Surgery

## 2015-11-07 DIAGNOSIS — I87311 Chronic venous hypertension (idiopathic) with ulcer of right lower extremity: Secondary | ICD-10-CM | POA: Diagnosis not present

## 2015-11-07 DIAGNOSIS — L97313 Non-pressure chronic ulcer of right ankle with necrosis of muscle: Secondary | ICD-10-CM | POA: Insufficient documentation

## 2015-11-07 DIAGNOSIS — I10 Essential (primary) hypertension: Secondary | ICD-10-CM | POA: Diagnosis not present

## 2015-11-07 DIAGNOSIS — G20 Parkinson's disease: Secondary | ICD-10-CM | POA: Insufficient documentation

## 2015-11-07 DIAGNOSIS — I890 Lymphedema, not elsewhere classified: Secondary | ICD-10-CM | POA: Diagnosis not present

## 2015-11-07 NOTE — Progress Notes (Signed)
JUSHUA, WALTMAN ([id number]) Visit Report for 11/07/2015 Chief Complaint Document Details Patient Name: YSMAEL, HIRES. Date of Service: 11/07/2015 10:00 AM Medical Record Number: [id number] Patient Account Number: [id number] Date of Birth/Sex: Aug 22, 1935 (80 y.o. Male) Treating RN: Curtis Sites Primary Care Physician: Dorothey Baseman Other Clinician: Referring Physician: Dorothey Baseman Treating Physician/Extender: Rudene Re in Treatment: 17 Information Obtained from: Patient Chief Complaint Patient presents for treatment of an open ulcer due to venous insufficiency. The patient has had a open wound on his right medial ankle for at least 3 years and was last seen in the wound clinic in February 2015. He was lost to follow-up after that. Electronic Signature(s) Signed: 11/07/2015 10:29:09 AM By: Evlyn Kanner MD, FACS Entered By: Evlyn Kanner on 11/07/2015 10:29:09 ALECXIS, BALTZELL ([id number]) -------------------------------------------------------------------------------- HPI Details Patient Name: AXXEL, GUDE. Date of Service: 11/07/2015 10:00 AM Medical Record Number: [id number] Patient Account Number: [id number] Date of Birth/Sex: 03/09/1936 (80 y.o. Male) Treating RN: Curtis Sites Primary Care Physician: Dorothey Baseman Other Clinician: Referring Physician: Terance Hart, DAVID Treating Physician/Extender: Rudene Re in Treatment: 28 History of Present Illness Location: right lower extremity ulceration Quality: Patient reports experiencing a dull pain to affected area(s). Severity: Patient states wound are getting worse. 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 Modifying Factors: Consults to this date include:has received treatment in the wound center in the past but stopped coming here since February 2015 Associated Signs and Symptoms: Patient reports having difficulty standing for long periods. HPI Description: 80 year old male who is known to have progressive weakness and has been in a nursing home for a while has had chronic lower extremity edema both legs and a large open wound on the right lower extremity which is has at least for about 3-4 years. The patient was seen in the wound clinic before and has been treated until February 2015 when he was lost to follow-up. Past medical history is significant for hypertension, gout, venous stasis ulcers, Parkinson's [name], constipation. He's not been a diabetic but his last hemoglobin A1c was 6 in March 2015. There is documentation that he's had endovenous ablation of bilateral varicose veins but we have no documentation about this and this may be done at least 4-5 years ago. 01/22/2015 -- he has his vascular test is scheduled for this afternoon. 01/29/2015 -- the patient's vascular test was canceled last week because he was unable to get onto the examining bed at [facility]. His Unna's boot was not applied either and the patient had a dressing placed by home health. Today when his dressing was removed we found a lot of maggots in his right lower extremity. 02/07/2015 -- Was seen in the vascular office by the PA and she noted that a bilateral venous duplex study did not show any DVT, SVT or reflux bilaterally. The patient was advised to continue with Unna's boot and would at some stage to require graduated compression stockings of the 20-30 mmHg variety. In the future lymphedema pumps would also be considered. 02/14/2015 -- his dressing is smelling quite a bit and there is a discoloration of the wound suggestive of an infection. Deep tissue cultures will be taken today. 02/21/2015 --  the culture  is back and it has growth moderate growth of Proteus and gram-negative rods sensitive to sulfa, ampicillin, cephazolin, Zosyn. He is already on doxycycline and his wound is looking clean and hence we will continue with this. 02/28/2015 -- he's been doing fine still on his antibiotics and has no fresh issues. 03/07/2015 -- I was told that because he lives in a nursing home the Apligraf was denied by his insurance company. 03/14/2015 -- we are awaiting samples and a trial of Puraply to be tried for his ulceration. 03/21/2015 -- his Apligraf was approved and he is here for his first application of Apligraf 03/28/2015 -- his wound has had quite a bit of secretion and needs to be changed and hence I will use the FARAH, BENISH. ([id number]) second application of Apligraf. 04/04/2015 -- he is here for a wound check but has a lot of secretions and hence his dressing needs to be taken down. 04/09/2015 -- he is here for his third application of Apligraf 04/18/2015 -- he is here for a wound check and though he does not have any overt infection he always has a foul odor to his leg. 04/25/2015 -- his wound has been doing much better and he has minimal drainage and the size is smaller. Of note he is on doxycycline. 05/02/2015 -- he is here for his fourth application of Apligraf 05/09/2015 -- he is here for a wound check and is bothered by a lot of odor from the wound. 05/16/2015 -- he has done very well, the odor and drainage is less with the wound dressing having been changed twice this week. He is here for his last application of Apligraf. 06/20/2015 -- an area superior to the previous wound has now opened up and this is a fairly superficial ulceration. 07/12/2015 -- he is here for his first application of Puraply. This is a sample and this is no charge. 07/18/2015 -- he is here for his second application of Puraply. This is a sample and this is no charge. 07/25/2015 -- he is here for his third  application of Puraply. This is a sample and this is no charge. 08/01/2015 -- he is here for his fourth application of Puraply. This is a sample and this is no charge. 08/08/2015 -- he is here for his fifth application of Puraply and this is a sample at no charge. 08/15/2015 -- he is here for his sixth application of Puraply and this is a sample at no charge. 08/29/2015 -- he is here for his eight application of Puraply and this is a sample at no charge. 09/05/2015 -- he is here for his nineth application of Puraply and this is a sample at no charge. 09/19/2015 -- this is the second week of using the collagen substitute and he is doing very well. 09/26/2015 -- this is the third week of using the collagen substitute. 10/03/2015 -- this is the fourth week of using the collagen substitute. 10/24/2015 --this is the seventh week of using the collagen substitute. 11/07/2015 -- last week he had not used the collagen substitute but today we have applied the eighth collagen substitute on his wound. Electronic Signature(s) Signed: 11/07/2015 10:30:29 AM By: Evlyn Kanner MD, FACS Entered By: Evlyn Kanner on 11/07/2015 10:30:28 DARVIS, CROFT ([id number]) -------------------------------------------------------------------------------- Physical Exam Details Patient Name: CARMINE, CARROZZA. Date of Service: 11/07/2015 10:00 AM Medical Record Number: [id number] Patient Account Number: [id number] Date of Birth/Sex: Feb 20, 1936 (80 y.o. Male) Treating RN: Curtis Sites Primary Care  Physician: Dorothey Baseman Other Clinician: Referring Physician: Terance Hart, DAVID Treating Physician/Extender: Rudene Re in Treatment: 42 Constitutional . Pulse regular. Respirations normal and unlabored. Afebrile. . Eyes Nonicteric. Reactive to light. Ears, Nose, Mouth, and Throat Lips, teeth, and gums WNL.Marland Kitchen Moist mucosa without lesions. Neck supple and nontender. No palpable supraclavicular or cervical  adenopathy. Normal sized without goiter. Respiratory WNL. No retractions.. Cardiovascular Pedal Pulses WNL. No clubbing, cyanosis or edema. Lymphatic No adneopathy. No adenopathy. No adenopathy. Musculoskeletal Adexa without tenderness or enlargement.. Digits and nails w/o clubbing, cyanosis, infection, petechiae, ischemia, or inflammatory conditions.. Integumentary (Hair, Skin) No suspicious lesions. No crepitus or fluctuance. No peri-wound warmth or erythema. No masses.Marland Kitchen Psychiatric Judgement and insight Intact.. No evidence of depression, anxiety, or agitation.. Notes the main wound is looking very good but he has got a couple of his satellite openings remote from the area which have opened up on the supple scar. After washing it out well with saline I have reapplied the collagen substitute as the dressing Electronic Signature(s) Signed: 11/07/2015 10:31:13 AM By: Evlyn Kanner MD, FACS Entered By: Evlyn Kanner on 11/07/2015 10:31:12 DARROLD, BEZEK ([id number]) -------------------------------------------------------------------------------- Physician Orders Details Patient Name: MAKHI, MUZQUIZ. Date of Service: 11/07/2015 10:00 AM Medical Record Number: [id number] Patient Account Number: [id number] Date of Birth/Sex: 1935/10/14 (80 y.o. Male) Treating RN: Clover Mealy, RN, BSN, [place] Primary Care Physician: Terance Hart, DAVID Other Clinician: Referring Physician: Terance Hart DAVID Treating Physician/Extender: Rudene Re in Treatment: 65 Verbal / Phone Orders: Yes Clinician: Afful, RN, BSN, Rita Read Back and Verified: Yes Diagnosis Coding Wound Cleansing Wound #5 Right,Medial Lower Leg o Cleanse wound with mild soap and water o No tub bath. Skin Barriers/Peri-Wound Care Wound #5 Right,Medial Lower Leg o Moisturizing lotion Primary Wound Dressing Wound #5 Right,Medial Lower Leg o Other: - collagen dressing applied by Dr Meyer Russel today in clinic Secondary  Dressing Wound #5 Right,Medial Lower Leg o ABD pad - Carboflex and ABD; gauze to bolster o Mepitel One - over collagen Dressing Change Frequency Wound #5 Right,Medial Lower Leg o Change dressing every week Follow-up Appointments Wound #5 Right,Medial Lower Leg o Return Appointment in 1 week. Edema Control Wound #5 Right,Medial Lower Leg o 4-Layer Compression System - Right Lower Extremity o Other: - SNF to provide patient with bilateral 20-9mmHg compression hose to be worn daily. Start left leg now and right leg once wound heals. This is to be worn EVERY day so multiple pairs should be provided. Electronic Signature(s) RORIK, VESPA ([id number]) Signed: 11/07/2015 4:29:13 PM By: Evlyn Kanner MD, FACS Signed: 11/07/2015 5:01:36 PM By: Elpidio Eric BSN, RN Entered By: Elpidio Eric on 11/07/2015 10:23:19 KRIKOR, WILLET ([id number]) -------------------------------------------------------------------------------- Problem List Details Patient Name: KHADIR, ROAM. Date of Service: 11/07/2015 10:00 AM Medical Record Number: [id number] Patient Account Number: [id number] Date of Birth/Sex: [birth year] (80 y.o. 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 Date of  Service: 11/07/2015 10:00 AM Medical Record Number: [id number] Patient Account Number: [id number] Date of Birth/Sex: August 10, 1935 (80 y.o. Male) Treating RN: Curtis Sites Primary Care Physician: Dorothey Baseman Other Clinician: Referring Physician: Dorothey Baseman Treating Physician/Extender: Rudene Re in Treatment: 67 Subjective Chief Complaint Information obtained from Patient Patient presents for treatment of an open ulcer due to venous insufficiency. The patient has had a open wound on his right medial ankle for at least 3 years and was last seen in the wound clinic in February 2015. He was lost to follow-up after that. History of Present Illness (HPI) The following HPI elements were documented for the patient's wound: Location: right lower extremity ulceration Quality: Patient reports experiencing a dull pain to affected area(s). Severity: Patient states wound are getting worse. 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 Modifying Factors: Consults to this date include:has received treatment in the wound center in the past but stopped coming here since February 2015 Associated Signs and Symptoms: Patient reports having difficulty standing for long periods. 80 year old male who is known to have progressive weakness and has been in a nursing home for a while has had chronic lower extremity edema both legs and a large open wound on the right lower extremity which is has at least for about 3-4 years. The patient was seen in the wound  clinic before and has been treated until February 2015 when he was lost to follow-up. Past medical history is significant for hypertension, gout, venous stasis ulcers, Parkinson's [name], constipation. He's not been a diabetic but his last hemoglobin A1c was 6 in March 2015. There is documentation that he's had endovenous ablation of bilateral varicose veins but we have no documentation about this and this may be done at least 4-5 years ago. 01/22/2015 -- he has his vascular test is scheduled for this afternoon. 01/29/2015 -- the patient's vascular test was canceled last week because he was unable to get onto the examining bed at [facility]. His Unna's boot was not applied either and the patient had a dressing placed by home health. Today when his dressing was removed we found a lot of maggots in his right lower extremity. 02/07/2015 -- Was seen in the vascular office by the PA and she noted that a bilateral venous duplex study did not show any DVT, SVT or reflux bilaterally. The patient was advised to continue with Unna's boot and would at some stage to require graduated compression stockings of the 20-30 mmHg variety. In the future lymphedema pumps would also be considered. 02/14/2015 -- his dressing is smelling quite a bit and there is a discoloration of the wound suggestive of an CASHIUS, GRANDSTAFF. ([id number]) infection. Deep tissue cultures will be taken today. 02/21/2015 -- the culture is back and it has growth moderate growth of Proteus and gram-negative rods sensitive to sulfa, ampicillin, cephazolin, Zosyn. He is already on doxycycline and his wound is looking clean and hence we will continue with this. 02/28/2015 -- he's been doing fine still on his antibiotics and has no fresh issues. 03/07/2015 -- I was told that because he lives in a nursing home the Apligraf was denied by his insurance company. 03/14/2015 -- we  are awaiting samples and a trial of Puraply to be tried for his  ulceration. 03/21/2015 -- his Apligraf was approved and he is here for his first application of Apligraf 03/28/2015 -- his wound has had quite a bit of secretion and needs to be changed and hence I will use the second application of Apligraf. 04/04/2015 -- he is here for a wound check but has a lot of secretions and hence his dressing needs to be taken down. 04/09/2015 -- he is here for his third application of Apligraf 04/18/2015 -- he is here for a wound check and though he does not have any overt infection he always has a foul odor to his leg. 04/25/2015 -- his wound has been doing much better and he has minimal drainage and the size is smaller. Of note he is on doxycycline. 05/02/2015 -- he is here for his fourth application of Apligraf 05/09/2015 -- he is here for a wound check and is bothered by a lot of odor from the wound. 05/16/2015 -- he has done very well, the odor and drainage is less with the wound dressing having been changed twice this week. He is here for his last application of Apligraf. 06/20/2015 -- an area superior to the previous wound has now opened up and this is a fairly superficial ulceration. 07/12/2015 -- he is here for his first application of Puraply. This is a sample and this is no charge. 07/18/2015 -- he is here for his second application of Puraply. This is a sample and this is no charge. 07/25/2015 -- he is here for his third application of Puraply. This is a sample and this is no charge. 08/01/2015 -- he is here for his fourth application of Puraply. This is a sample and this is no charge. 08/08/2015 -- he is here for his fifth application of Puraply and this is a sample at no charge. 08/15/2015 -- he is here for his sixth application of Puraply and this is a sample at no charge. 08/29/2015 -- he is here for his eight application of Puraply and this is a sample at no charge. 09/05/2015 -- he is here for his nineth application of Puraply and this is a sample  at no charge. 09/19/2015 -- this is the second week of using the collagen substitute and he is doing very well. 09/26/2015 -- this is the third week of using the collagen substitute. 10/03/2015 -- this is the fourth week of using the collagen substitute. 10/24/2015 --this is the seventh week of using the collagen substitute. 11/07/2015 -- last week he had not used the collagen substitute but today we have applied the eighth collagen substitute on his wound. Objective Constitutional KESLEY, GAFFEY. ([id number]) Pulse regular. Respirations normal and unlabored. Afebrile. Vitals Time Taken: 10:02 AM, Height: 68 in, Temperature: 97.6 F, Pulse: 64 bpm, Respiratory Rate: 18 breaths/min, Blood Pressure: 127/53 mmHg. Eyes Nonicteric. Reactive to light. Ears, Nose, Mouth, and Throat Lips, teeth, and gums WNL.Marland Kitchen Moist mucosa without lesions. Neck supple and nontender. No palpable supraclavicular or cervical adenopathy. Normal sized without goiter. Respiratory WNL. No retractions.. Cardiovascular Pedal Pulses WNL. No clubbing, cyanosis or edema. Lymphatic No adneopathy. No adenopathy. No adenopathy. Musculoskeletal Adexa without tenderness or enlargement.. Digits and nails w/o clubbing, cyanosis, infection, petechiae, ischemia, or inflammatory conditions.Marland Kitchen Psychiatric Judgement and insight Intact.. No evidence of depression, anxiety, or agitation.. General Notes: the main wound is looking very good but he has got a couple of his satellite openings remote from the  area which have opened up on the supple scar. After washing it out well with saline I have reapplied the collagen substitute as the dressing Integumentary (Hair, Skin) No suspicious lesions. No crepitus or fluctuance. No peri-wound warmth or erythema. No masses.. Wound #5 status is Open. Original cause of wound was Gradually Appeared. The wound is located on the Right,Medial Lower Leg. The wound measures 3.2cm length x 1cm width  x 0.1cm depth; 2.513cm^2 area and 0.251cm^3 volume. The wound is limited to skin breakdown. There is no tunneling or undermining noted. There is a medium amount of serosanguineous drainage noted. The wound margin is distinct with the outline attached to the wound base. There is large (67-100%) pink, pale granulation within the wound bed. There is no necrotic tissue within the wound bed. The periwound skin appearance exhibited: Moist. The periwound skin appearance did not exhibit: Callus, Crepitus, Excoriation, Fluctuance, Friable, Induration, Localized Edema, Rash, Scarring, Dry/Scaly, Maceration, Atrophie Blanche, Cyanosis, Ecchymosis, Hemosiderin Staining, Mottled, Pallor, Rubor, Erythema. Periwound temperature was noted as No Abnormality. 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 Skin Barriers/Peri-Wound Care: Wound #5 Right,Medial Lower Leg: Moisturizing lotion Primary Wound Dressing: Wound #5 Right,Medial Lower Leg: Other: - collagen dressing applied by Dr Meyer Russel today in clinic Secondary Dressing: Wound #5 Right,Medial Lower Leg: ABD pad - Carboflex and ABD; gauze to bolster Mepitel One - over collagen Dressing Change Frequency: Wound #5 Right,Medial Lower Leg: Change dressing every week Follow-up Appointments: Wound #5 Right,Medial Lower Leg: Return Appointment in 1 week. Edema Control: Wound #5 Right,Medial Lower Leg: 4-Layer Compression System - Right Lower Extremity Other: - SNF to provide patient with bilateral 20-24mmHg compression hose to be worn daily. Start left leg now and  right leg once wound heals. This is to be worn EVERY day so multiple pairs should be provided. OWYN, RAULSTON ([id number]) today I have recommended the reapply the collagen substitute dressing and a 4-layer Profore wrap and he will continue with elevation and exercise. His nursing home is trying to reorder the correct compression stocking for him and we have recommended the 20-30 mm variety. Electronic Signature(s) Signed: 11/07/2015 10:31:51 AM By: Evlyn Kanner MD, FACS Entered By: Evlyn Kanner on 11/07/2015 10:31:50 MARLON, SULEIMAN ([id number]) -------------------------------------------------------------------------------- SuperBill Details Patient Name: ORVIE, CARADINE. Date of Service: 11/07/2015 Medical Record Number: [id number] Patient Account Number: [id number] Date of Birth/Sex: 1936/06/19 (80 y.o. 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 E66.01 Morbid (severe) obesity due to excess calories I89.0 Lymphedema, not elsewhere classified Facility Procedures CPT4: Description Modifier Quantity Code [id number] (Facility Use Only) [id number] - APPLY MULTLAY COMPRS LWR LT 1 LEG Physician Procedures CPT4 Code Description: [id number] - WC PHYS LEVEL 3 - EST PT ICD-10 Description Diagnosis I87.311 Chronic venous hypertension (idiopathic) with ulcer of 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 E66.01 Morbid (severe) obesity due  to excess calories I89.0 Lymphedema, not elsewhere classified  Modifier: right lower s of muscle Quantity: 1 extremity Electronic Signature(s) Signed: 11/07/2015 4:40:22 PM By: Elpidio Eric BSN, RN Previous Signature: 11/07/2015 10:32:10 AM Version By: Evlyn Kanner MD, FACS Entered By: Elpidio Eric on 11/07/2015 16:40:22

## 2015-11-09 NOTE — Progress Notes (Signed)
BETH, BEDSWORTH ([id number]) Visit Report for 11/07/2015 Arrival Information Details Patient Name: LABARRON, PIANKA. Date of Service: 11/07/2015 10:00 AM Medical Record Number: [id number] Patient Account Number: [id number] Date of Birth/Sex: 17-May-1936 (80 y.o. Male) Treating RN: Huel Coventry Primary Care Physician: Terance Hart, DAVID Other Clinician: Referring Physician: Terance Hart, DAVID Treating Physician/Extender: Rudene Re in Treatment: 42 Visit Information History Since Last Visit Added or deleted any medications: No Patient Arrived: Wheel Chair Any new allergies or adverse reactions: No Arrival Time: 10:01 Had a fall or experienced change in No activities of daily living that may affect Accompanied By: self risk of falls: Transfer Assistance: None Signs or symptoms of abuse/neglect since last No Patient Identification Verified: Yes visito Secondary Verification Process Yes Hospitalized since last visit: No Completed: Has Dressing in Place as Prescribed: Yes Patient Requires Transmission-Based No Pain Present Now: No Precautions: Patient Has Alerts: No Electronic Signature(s) Signed: 11/08/2015 5:12:23 PM By: Elliot Gurney, RN, BSN, Kim RN, BSN Entered By: Elliot Gurney, RN, BSN, Kim on 11/07/2015 10:02:09 [name] ([id number]) -------------------------------------------------------------------------------- Encounter Discharge Information Details Patient Name: DIETRICK, KOZINSKI. Date of Service: 11/07/2015 10:00 AM Medical Record Number: [id number] Patient Account Number: [id number] Date of Birth/Sex: 06-26-1936 (80 y.o. Male) Treating RN: Curtis Sites Primary Care Physician: Dorothey Baseman Other Clinician: Referring Physician: Dorothey Baseman Treating Physician/Extender: Rudene Re in Treatment: 30 Encounter Discharge Information Items Discharge Pain Level: 0 Discharge Condition: Stable Ambulatory Status: Wheelchair Discharge Destination: Nursing  Home Transportation: Other Accompanied By: self Schedule Follow-up Appointment: No Medication Reconciliation completed No and provided to Patient/Care Mace Weinberg: Provided on Clinical Summary of Care: 11/07/2015 Form Type Recipient Paper Patient [facility] Electronic Signature(s) Signed: 11/08/2015 5:12:23 PM By: Elliot Gurney, RN, BSN, Kim RN, BSN Previous Signature: 11/07/2015 10:40:55 AM Version By: Gwenlyn Perking Entered By: Elliot Gurney RN, BSN, Kim on 11/07/2015 10:43:44 Jaaziah, Padgett Chrissie Noa ([id number]) -------------------------------------------------------------------------------- Lower Extremity Assessment Details Patient Name: TYQUAN, EILERS. Date of Service: 11/07/2015 10:00 AM Medical Record Number: [id number] Patient Account Number: [id number] Date of Birth/Sex: [birth year] (80 y.o. Male) Treating RN: Huel Coventry Primary Care Physician: Dorothey Baseman Other Clinician: Referring Physician: Terance Hart, DAVID Treating Physician/Extender: Rudene Re in Treatment: 42 Edema Assessment Assessed: [Left: No] [Right: No] E[Left: dema] [Right: :] Calf Left: Right: Point of Measurement: cm From Medial Instep cm 36 cm Ankle Left: Right: Point of Measurement: cm From Medial Instep cm 26.5 cm Vascular Assessment Pulses: Posterior Tibial Dorsalis Pedis Palpable: [Right:Yes] Extremity colors, hair growth, and conditions: Extremity Color: [Right:Hyperpigmented] Hair Growth on Extremity: [Right:No] Temperature of Extremity: [Right:Warm] Capillary Refill: [Right:> 3 seconds] Toe Nail Assessment Left: Right: Thick: Yes Discolored: Yes Deformed: Yes Improper Length and Hygiene: Yes Electronic Signature(s) Signed: 11/08/2015 5:12:23 PM By: Elliot Gurney, RN, BSN, Kim RN, BSN Entered By: Elliot Gurney, RN, BSN, Kim on 11/07/2015 10:10:26 [name] ([id number]) -------------------------------------------------------------------------------- Multi Wound Chart Details Patient Name: [name]. Date  of Service: 11/07/2015 10:00 AM Medical Record Number: [id number] Patient Account Number: [id number] Date of Birth/Sex: 1936/06/13 (80 y.o. Male) Treating RN: Clover Mealy, RN, BSN, Vassar Sink Primary Care Physician: Terance Hart, DAVID Other Clinician: Referring Physician: Terance Hart, DAVID Treating Physician/Extender: Rudene Re in Treatment: 42 Vital Signs Height(in): 68 Pulse(bpm): 64 Weight(lbs): Blood Pressure 127/53 (mmHg): Body Mass Index(BMI): Temperature(F): 97.6 Respiratory Rate 18 (breaths/min): Photos: [5:No Photos] [N/A:N/A] Wound Location: [5:Right Lower Leg - Medial] [N/A:N/A] Wounding Event: [5:Gradually Appeared] [N/A:N/A] Primary Etiology: [5:Venous Leg Ulcer] [N/A:N/A] Comorbid History: [5:Cataracts, Anemia, Hypertension, Osteoarthritis] [N/A:N/A] Date Acquired: [5:11/13/2014] [N/A:N/A] Weeks of Treatment: [5:42] [N/A:N/A] Wound  Status: [5:Open] [N/A:N/A] Measurements L x W x D 3.2x1x0.1 [N/A:N/A] (cm) Area (cm) : [5:2.513] [N/A:N/A] Volume (cm) : [5:0.251] [N/A:N/A] % Reduction in Area: [5:97.50%] [N/A:N/A] % Reduction in Volume: 99.20% [N/A:N/A] Classification: [5:Full Thickness Without Exposed Support Structures] [N/A:N/A] Exudate Amount: [5:Medium] [N/A:N/A] Exudate Type: [5:Serosanguineous] [N/A:N/A] Exudate Color: [5:red, brown] [N/A:N/A] Wound Margin: [5:Distinct, outline attached] [N/A:N/A] Granulation Amount: [5:Large (67-100%)] [N/A:N/A] Granulation Quality: [5:Pink, Pale] [N/A:N/A] Necrotic Amount: [5:None Present (0%)] [N/A:N/A] Exposed Structures: [5:Fascia: No Fat: No Tendon: No Muscle: No] [N/A:N/A] Joint: No Bone: No Limited to Skin Breakdown Epithelialization: Medium (34-66%) N/A N/A Periwound Skin Texture: Edema: No N/A N/A Excoriation: No Induration: No Callus: No Crepitus: No Fluctuance: No Friable: No Rash: No Scarring: No Periwound Skin Moist: Yes N/A N/A Moisture: Maceration: No Dry/Scaly: No Periwound Skin Color:  Atrophie Blanche: No N/A N/A Cyanosis: No Ecchymosis: No Erythema: No Hemosiderin Staining: No Mottled: No Pallor: No Rubor: No Temperature: No Abnormality N/A N/A Tenderness on No N/A N/A Palpation: Wound Preparation: Ulcer Cleansing: Other: N/A N/A soap and water Topical Anesthetic Applied: None Treatment Notes Electronic Signature(s) Signed: 11/07/2015 5:01:36 PM By: Elpidio Eric BSN, RN Entered By: Elpidio Eric on 11/07/2015 10:22:34 [name] ([id number]) -------------------------------------------------------------------------------- Multi-Disciplinary Care Plan Details Patient Name: BENNY, DEUTSCHMAN. Date of Service: 11/07/2015 10:00 AM Medical Record Number: [id number] Patient Account Number: [id number] Date of Birth/Sex: 06-02-1936 (80 y.o. Male) Treating RN: Clover Mealy, RN, BSN,  Sink Primary Care Physician: Terance Hart, DAVID Other Clinician: Referring Physician: Terance Hart, DAVID Treating Physician/Extender: Rudene Re in Treatment: 35 Active Inactive Orientation to the Wound Care Program Nursing Diagnoses: Knowledge deficit related to the wound healing center program Goals: Patient/caregiver will verbalize understanding of the Wound Healing Center Program Date Initiated: 01/15/2015 Goal Status: Active Interventions: Provide education on orientation to the wound center Notes: Venous Leg Ulcer Nursing Diagnoses: Knowledge deficit related to disease process and management Potential for venous Insuffiency (use before diagnosis confirmed) Goals: Patient will maintain optimal edema control Date Initiated: 01/15/2015 Goal Status: Active Patient/caregiver will verbalize understanding of disease process and disease management Date Initiated: 01/15/2015 Goal Status: Active Verify adequate tissue perfusion prior to therapeutic compression application Date Initiated: 01/15/2015 Goal Status: Active Interventions: Assess peripheral edema status every  visit. Compression as ordered Provide education on venous insufficiency LEYTON, MAGOON ([id number]) Treatment Activities: Test ordered outside of clinic : 10/24/2015 Therapeutic compression applied : 10/24/2015 Venous Duplex Doppler : 10/24/2015 Notes: Wound/Skin Impairment Nursing Diagnoses: Impaired tissue integrity Knowledge deficit related to ulceration/compromised skin integrity Goals: Patient/caregiver will verbalize understanding of skin care regimen Date Initiated: 01/15/2015 Goal Status: Active Ulcer/skin breakdown will have a volume reduction of 30% by week 4 Date Initiated: 01/15/2015 Goal Status: Active Ulcer/skin breakdown will have a volume reduction of 50% by week 8 Date Initiated: 01/15/2015 Goal Status: Active Ulcer/skin breakdown will have a volume reduction of 80% by week 12 Date Initiated: 01/15/2015 Goal Status: Active Ulcer/skin breakdown will heal within 14 weeks Date Initiated: 01/15/2015 Goal Status: Active Interventions: Assess patient/caregiver ability to perform ulcer/skin care regimen upon admission and as needed Assess ulceration(s) every visit Provide education on ulcer and skin care Notes: Electronic Signature(s) Signed: 11/07/2015 5:01:36 PM By: Elpidio Eric BSN, RN Entered By: Elpidio Eric on 11/07/2015 10:22:22 DARUS, HERSHMAN ([id number]) -------------------------------------------------------------------------------- Pain Assessment Details Patient Name: [name]. Date of Service: 11/07/2015 10:00 AM Medical Record Number: [id number] Patient Account Number: [id number] Date of Birth/Sex: [birth year] (80 y.o. Male) Treating RN: Huel Coventry Primary Care Physician: Dorothey Baseman Other Clinician:  Referring Physician: Terance Hart, DAVID Treating Physician/Extender: Rudene Re in Treatment: 4 Active Problems Location of Pain Severity and Description of Pain Patient Has Paino Yes Site Locations Pain Location: Generalized  Pain Pain Management and Medication Current Pain Management: Notes pain is in shoulders Electronic Signature(s) Signed: 11/08/2015 5:12:23 PM By: Elliot Gurney, RN, BSN, Kim RN, BSN Entered By: Elliot Gurney, RN, BSN, Kim on 11/07/2015 10:02:52 [name] ([id number]) -------------------------------------------------------------------------------- Patient/Caregiver Education Details Patient Name: LEELAND, LOVELADY. Date of Service: 11/07/2015 10:00 AM Medical Record Number: [id number] Patient Account Number: [id number] Date of Birth/Gender: 03/30/1936 (80 y.o. Male) Treating RN: Huel Coventry Primary Care Physician: Terance Hart, DAVID Other Clinician: Referring Physician: Dorothey Baseman Treating Physician/Extender: Rudene Re in Treatment: 65 Education Assessment Education Provided To: Patient Education Topics Provided Venous: Handouts: Other: do not get dressing wet Methods: Explain/Verbal Responses: State content correctly Electronic Signature(s) Signed: 11/08/2015 5:12:23 PM By: Elliot Gurney, RN, BSN, Kim RN, BSN Entered By: Elliot Gurney, RN, BSN, Kim on 11/07/2015 10:44:07 [name] ([id number]) -------------------------------------------------------------------------------- Wound Assessment Details Patient Name: KOLSON, CHOVANEC. Date of Service: 11/07/2015 10:00 AM Medical Record Number: [id number] Patient Account Number: [id number] Date of Birth/Sex: February 10, 1936 (80 y.o. Male) Treating RN: Clover Mealy, RN, BSN, Rita Primary Care Physician: Terance Hart, DAVID Other Clinician: Referring Physician: Terance Hart, DAVID Treating Physician/Extender: Rudene Re in Treatment: 42 Wound Status Wound Number: 5 Primary Venous Leg Ulcer Etiology: Wound Location: Right Lower Leg - Medial Wound Status: Open Wounding Event: Gradually Appeared Comorbid Cataracts, Anemia, Hypertension, Date Acquired: 11/13/2014 History: Osteoarthritis Weeks Of Treatment: 42 Clustered Wound: No Photos Photo  Uploaded By: Elliot Gurney, RN, BSN, Kim on 11/07/2015 10:46:15 Wound Measurements Length: (cm) 3.2 Width: (cm) 1 Depth: (cm) 0.1 Area: (cm) 2.513 Volume: (cm) 0.251 % Reduction in Area: 97.5% % Reduction in Volume: 99.2% Epithelialization: Medium (34-66%) Tunneling: No Undermining: No Wound Description Full Thickness Without Exposed Classification: Support Structures Wound Margin: Distinct, outline attached Exudate Medium Amount: Exudate Type: Serosanguineous Exudate Color: red, brown Foul Odor After Cleansing: No Wound Bed Granulation Amount: Large (67-100%) Exposed Structure Granulation Quality: Pink, Pale Fascia Exposed: No Necrotic Amount: None Present (0%) Fat Layer Exposed: No CARSEN, LEAF ([id number]) Tendon Exposed: No Muscle Exposed: No Joint Exposed: No Bone Exposed: No Limited to Skin Breakdown Periwound Skin Texture Texture Color No Abnormalities Noted: No No Abnormalities Noted: No Callus: No Atrophie Blanche: No Crepitus: No Cyanosis: No Excoriation: No Ecchymosis: No Fluctuance: No Erythema: No Friable: No Hemosiderin Staining: No Induration: No Mottled: No Localized Edema: No Pallor: No Rash: No Rubor: No Scarring: No Temperature / Pain Moisture Temperature: No Abnormality No Abnormalities Noted: No Dry / Scaly: No Maceration: No Moist: Yes Wound Preparation Ulcer Cleansing: Other: soap and water, Topical Anesthetic Applied: None Treatment Notes Wound #5 (Right, Medial Lower Leg) 1. Cleansed with: Clean wound with Normal Saline 2. Anesthetic Topical Lidocaine 4% cream to wound bed prior to debridement 3. Peri-wound Care: Skin Prep 4. Dressing Applied: Other dressing (specify in notes) 5. Secondary Dressing Applied ABD Pad 7. Secured with 4-Layer Compression System - Right Lower Extremity Notes collagen dressing Electronic Signature(s) OMEED, OSUNA ([id number]) Signed: 11/07/2015 5:01:36 PM By: Elpidio Eric BSN,  RN Entered By: Elpidio Eric on 11/07/2015 10:22:11 GARWOOD, WENTZELL ([id number]) -------------------------------------------------------------------------------- Vitals Details Patient Name: AARYA, QUEBEDEAUX. Date of Service: 11/07/2015 10:00 AM Medical Record Number: [id number] Patient Account Number: [id number] Date of Birth/Sex: 1935-08-01 (80 y.o. Male) Treating RN: Huel Coventry Primary Care Physician: Dorothey Baseman Other Clinician: Referring Physician: Terance Hart, DAVID Treating  Physician/Extender: Rudene Re in Treatment: 32 Vital Signs Time Taken: 10:02 Temperature (F): 97.6 Height (in): 68 Pulse (bpm): 64 Respiratory Rate (breaths/min): 18 Blood Pressure (mmHg): 127/53 Reference Range: 80 - 120 mg / dl Electronic Signature(s) Signed: 11/08/2015 5:12:23 PM By: Elliot Gurney, RN, BSN, Kim RN, BSN Entered By: Elliot Gurney, RN, BSN, Kim on 11/07/2015 10:03:18

## 2015-11-14 ENCOUNTER — Encounter: Payer: Medicare Other | Admitting: Surgery

## 2015-11-14 DIAGNOSIS — I87311 Chronic venous hypertension (idiopathic) with ulcer of right lower extremity: Secondary | ICD-10-CM | POA: Diagnosis not present

## 2015-11-14 NOTE — Progress Notes (Addendum)
KAIYAN, LUCZAK ([id number]) Visit Report for 11/14/2015 Chief Complaint Document Details Patient Name: [name], [name] Date of Service: 11/14/2015 12:45 PM Medical Record Number: [id number] Patient Account Number: [id number] Date of Birth/Sex: 10-30-1935 (80 y.o. Male) Treating RN: Huel Coventry Primary Care Physician: Dorothey Baseman Other Clinician: Referring Physician: Dorothey Baseman Treating Physician/Extender: Rudene Re in Treatment: 60 Information Obtained from: Patient Chief Complaint Patient presents for treatment of an open ulcer due to venous insufficiency. The patient has had a open wound on his right medial ankle for at least 3 years and was last seen in the wound clinic in February 2015. He was lost to follow-up after that. Electronic Signature(s) Signed: 11/14/2015 1:28:33 PM By: Evlyn Kanner MD, FACS Entered By: Evlyn Kanner on 11/14/2015 13:28:33 DAQUON, GREENLEAF ([id number]) -------------------------------------------------------------------------------- HPI Details Patient Name: [name] Date of Service: 11/14/2015 12:45 PM Medical Record Number: [id number] Patient Account Number: [id number] Date of Birth/Sex: 1936/01/30 (80 y.o. Male) Treating RN: Huel Coventry Primary Care Physician: Dorothey Baseman Other Clinician: Referring Physician: Dorothey Baseman Treating Physician/Extender: Rudene Re in Treatment: 32 History of Present Illness Location: right lower extremity ulceration Quality: Patient reports experiencing a dull pain to affected area(s). Severity: Patient states wound are getting worse. 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 Modifying Factors: Consults to this date include:has received treatment in the wound center in the past but stopped coming here since February 2015 Associated Signs and Symptoms: Patient reports having difficulty standing for long periods. HPI Description: 80 year old male who is known to have progressive weakness and has been in a nursing home for a while has had chronic lower extremity edema both legs and a large open wound on the right lower extremity which is has at least for about 3-4 years. The patient was seen in the wound clinic before and has been treated until February 2015 when he was lost to follow-up. Past medical history is significant for hypertension, gout, venous stasis ulcers, Parkinson's [name], constipation. He's not been a diabetic but his last hemoglobin A1c was 6 in March 2015. There is documentation that he's had endovenous ablation of bilateral varicose veins but we have no documentation about this and this may be done at least 4-5 years ago. 01/22/2015 -- he has his vascular test is scheduled for this afternoon. 01/29/2015 -- the patient's vascular test was canceled last week because he was unable to get onto the examining bed at [facility]. His Unna's boot was not applied either and the patient had a dressing placed by home health. Today when his dressing was removed we found a lot of maggots in his right lower extremity. 02/07/2015 -- Was seen in the vascular office by the PA and she noted that a bilateral venous duplex study did not show any DVT, SVT or reflux bilaterally. The patient was advised to continue with Unna's boot and would at some stage to require graduated compression stockings of the 20-30 mmHg variety. In the future lymphedema pumps would also be considered. 02/14/2015 -- his dressing is smelling quite a bit and there is a discoloration of the wound suggestive of an infection. Deep tissue cultures will be taken today. 02/21/2015 --  the culture  is back and it has growth moderate growth of Proteus and gram-negative rods sensitive to sulfa, ampicillin, cephazolin, Zosyn. He is already on doxycycline and his wound is looking clean and hence we will continue with this. 02/28/2015 -- he's been doing fine still on his antibiotics and has no fresh issues. 03/07/2015 -- I was told that because he lives in a nursing home the Apligraf was denied by his insurance company. 03/14/2015 -- we are awaiting samples and a trial of Puraply to be tried for his ulceration. 03/21/2015 -- his Apligraf was approved and he is here for his first application of Apligraf 03/28/2015 -- his wound has had quite a bit of secretion and needs to be changed and hence I will use the SEENA, FACE. ([id number]) second application of Apligraf. 04/04/2015 -- he is here for a wound check but has a lot of secretions and hence his dressing needs to be taken down. 04/09/2015 -- he is here for his third application of Apligraf 04/18/2015 -- he is here for a wound check and though he does not have any overt infection he always has a foul odor to his leg. 04/25/2015 -- his wound has been doing much better and he has minimal drainage and the size is smaller. Of note he is on doxycycline. 05/02/2015 -- he is here for his fourth application of Apligraf 05/09/2015 -- he is here for a wound check and is bothered by a lot of odor from the wound. 05/16/2015 -- he has done very well, the odor and drainage is less with the wound dressing having been changed twice this week. He is here for his last application of Apligraf. 06/20/2015 -- an area superior to the previous wound has now opened up and this is a fairly superficial ulceration. 07/12/2015 -- he is here for his first application of Puraply. This is a sample and this is no charge. 07/18/2015 -- he is here for his second application of Puraply. This is a sample and this is no charge. 07/25/2015 -- he is here for his third  application of Puraply. This is a sample and this is no charge. 08/01/2015 -- he is here for his fourth application of Puraply. This is a sample and this is no charge. 08/08/2015 -- he is here for his fifth application of Puraply and this is a sample at no charge. 08/15/2015 -- he is here for his sixth application of Puraply and this is a sample at no charge. 08/29/2015 -- he is here for his eight application of Puraply and this is a sample at no charge. 09/05/2015 -- he is here for his nineth application of Puraply and this is a sample at no charge. 09/19/2015 -- this is the second week of using the collagen substitute and he is doing very well. 09/26/2015 -- this is the third week of using the collagen substitute. 10/03/2015 -- this is the fourth week of using the collagen substitute. 10/24/2015 --this is the seventh week of using the collagen substitute. 11/07/2015 -- last week he had not used the collagen substitute but today we have applied the eighth collagen substitute on his wound. 11/14/2015 -- he continues to have 2 small areas which are open and we will use the ninth application of the collagen substitute Electronic Signature(s) Signed: 11/14/2015 1:29:04 PM By: Evlyn Kanner MD, FACS Entered By: Evlyn Kanner on 11/14/2015 13:29:04 GRADYN, SHEIN ([id number]) -------------------------------------------------------------------------------- Physical Exam Details Patient Name: NYSIR, FERGUSSON. Date of Service: 11/14/2015  12:45 PM Medical Record Number: [id number] Patient Account Number: [id number] Date of Birth/Sex: 1935/10/23 (80 y.o. Male) Treating RN: Huel Coventry Primary Care Physician: Terance Hart, DAVID Other Clinician: Referring Physician: Terance Hart, DAVID Treating Physician/Extender: Rudene Re in Treatment: 43 Constitutional . Pulse regular. Respirations normal and unlabored. Afebrile. . Eyes Nonicteric. Reactive to light. Ears, Nose, Mouth, and Throat Lips,  teeth, and gums WNL.Marland Kitchen Moist mucosa without lesions. Neck supple and nontender. No palpable supraclavicular or cervical adenopathy. Normal sized without goiter. Respiratory WNL. No retractions.. Cardiovascular Pedal Pulses WNL. No clubbing, cyanosis or edema. Lymphatic No adneopathy. No adenopathy. No adenopathy. Musculoskeletal Adexa without tenderness or enlargement.. Digits and nails w/o clubbing, cyanosis, infection, petechiae, ischemia, or inflammatory conditions.. Integumentary (Hair, Skin) No suspicious lesions. No crepitus or fluctuance. No peri-wound warmth or erythema. No masses.Marland Kitchen Psychiatric Judgement and insight Intact.. No evidence of depression, anxiety, or agitation.. Notes the main wound has gone down significantly in size and there is a small satellite wound which is minimally open. We will use the collagen substitute on both these wounds. Electronic Signature(s) Signed: 11/14/2015 1:29:55 PM By: Evlyn Kanner MD, FACS Entered By: Evlyn Kanner on 11/14/2015 13:29:55 ARIQ, KHAMIS ([id number]) -------------------------------------------------------------------------------- Physician Orders Details Patient Name: KA, FLAMMER. Date of Service: 11/14/2015 12:45 PM Medical Record Number: [id number] Patient Account Number: [id number] Date of Birth/Sex: 06/11/1936 (80 y.o. 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 Skin Barriers/Peri-Wound Care Wound #5 Right,Medial Lower Leg o Moisturizing lotion Primary Wound Dressing Wound #5 Right,Medial Lower Leg o Other: - collagen dressing applied by Dr Meyer Russel today in clinic Secondary Dressing Wound #5 Right,Medial Lower Leg o ABD pad - Carboflex and ABD; gauze to bolster o Mepitel One - over collagen Dressing Change Frequency Wound #5 Right,Medial Lower Leg o Change dressing every week Follow-up Appointments Wound #5 Right,Medial Lower Leg o Return Appointment in 1 week. Edema Control PRANIT, OWENSBY ([id number]) Wound #5 Right,Medial Lower Leg o 4-Layer Compression System - Right Lower Extremity o Other: - SNF to provide patient with bilateral 20-36mmHg compression hose to be worn daily. Start left leg now and right leg once wound heals. This is to be worn EVERY day so multiple pairs should be provided. Electronic Signature(s) Signed: 11/14/2015 4:06:04 PM By: Evlyn Kanner MD, FACS Signed: 11/14/2015 5:50:20 PM By: Elliot Gurney RN, BSN, Kim RN, BSN Entered By: Elliot Gurney, RN, BSN, Kim on 11/14/2015 13:31:30 OBINNA, EHRESMAN ([id number]) -------------------------------------------------------------------------------- Problem List Details Patient Name: MAVERIC, DEBONO. Date of Service: 11/14/2015 12:45 PM Medical Record Number: [id number] Patient Account Number: [id number] Date of Birth/Sex: 1936-01-29 (80 y.o. Male) Treating RN: Huel Coventry Primary Care Physician: Dorothey Baseman Other Clinician: Referring Physician: Dorothey Baseman Treating Physician/Extender: Rudene Re in Treatment: 34 Active Problems ICD-10 Encounter Code Description Active Date Diagnosis I87.311 Chronic venous hypertension (idiopathic) with ulcer of 01/15/2015 Yes right lower extremity 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s of 01/15/2015  Yes muscle E66.01 Morbid (severe) obesity due to excess  calories 01/15/2015 Yes I89.0 Lymphedema, not elsewhere classified 02/07/2015 Yes Inactive Problems Resolved Problems Electronic Signature(s) Signed: 11/14/2015 1:28:20 PM By: Evlyn Kanner MD, FACS Entered By: Evlyn Kanner on 11/14/2015 13:28:20 JABREEL, CHIMENTO ([id number]) -------------------------------------------------------------------------------- Progress Note Details Patient Name: [name]. Date of Service: 11/14/2015 12:45 PM Medical Record Number: [id number] Patient Account Number: [id number] Date of Birth/Sex: [birth year] (80 y.o. Male) Treating RN: Huel Coventry Primary Care Physician: Dorothey Baseman Other Clinician: Referring Physician: Dorothey Baseman Treating Physician/Extender: Rudene Re in Treatment: 35 Subjective Chief Complaint Information obtained from Patient Patient presents for treatment of an open ulcer due to venous insufficiency. The patient has had a open wound on his right medial ankle for at least 3 years and was last seen in the wound clinic in February 2015. He was lost to follow-up after that. History of Present Illness (HPI) The following HPI elements were documented for the patient's wound: Location: right lower extremity ulceration Quality: Patient reports experiencing a dull pain to affected area(s). Severity: Patient states wound are getting worse. 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 Modifying Factors: Consults to this date include:has received treatment in the wound center in the past but stopped coming here since February 2015 Associated Signs and Symptoms: Patient reports  having difficulty standing for long periods. 80 year old male who is known to have progressive weakness and has been in a nursing home for a while has had chronic lower extremity edema both legs and a large open wound on the right lower extremity which is has at least for about 3-4 years. The patient was seen in the wound clinic before and has been treated until February 2015 when he was lost to follow-up. Past medical history is significant for hypertension, gout, venous stasis ulcers, Parkinson's [name], constipation. He's not been a diabetic but his last hemoglobin A1c was 6 in March 2015. There is documentation that he's had endovenous ablation of bilateral varicose veins but we have no documentation about this and this may be done at least 4-5 years ago. 01/22/2015 -- he has his vascular test is scheduled for this afternoon. 01/29/2015 -- the patient's vascular test was canceled last week because he was unable to get onto the examining bed at [facility]. His Unna's boot was not applied either and the patient had a dressing placed by home health. Today when his dressing was removed we found a lot of maggots in his right lower extremity. 02/07/2015 -- Was seen in the vascular office by the PA and she noted that a bilateral venous duplex study did not show any DVT, SVT or reflux bilaterally. The patient was advised to continue with Unna's boot and would at some stage to require graduated compression stockings of the 20-30 mmHg variety. In the future lymphedema pumps would also be considered. 02/14/2015 -- his dressing is smelling quite a bit and there is a discoloration of the wound suggestive of an DAMIONE, ROBIDEAU. ([id number]) infection. Deep tissue cultures will be taken today. 02/21/2015 -- the culture is back and it has growth moderate growth of Proteus and gram-negative rods sensitive to sulfa, ampicillin, cephazolin, Zosyn. He is already on doxycycline and his wound is looking  clean  and hence we will continue with this. 02/28/2015 -- he's been doing fine still on his antibiotics and has no fresh issues. 03/07/2015 -- I was told that because he lives in a nursing home the Apligraf was denied by his insurance company. 03/14/2015 -- we are awaiting samples and a trial of Puraply to be tried for his ulceration. 03/21/2015 -- his Apligraf was approved and he is here for his first application of Apligraf 03/28/2015 -- his wound has had quite a bit of secretion and needs to be changed and hence I will use the second application of Apligraf. 04/04/2015 -- he is here for a wound check but has a lot of secretions and hence his dressing needs to be taken down. 04/09/2015 -- he is here for his third application of Apligraf 04/18/2015 -- he is here for a wound check and though he does not have any overt infection he always has a foul odor to his leg. 04/25/2015 -- his wound has been doing much better and he has minimal drainage and the size is smaller. Of note he is on doxycycline. 05/02/2015 -- he is here for his fourth application of Apligraf 05/09/2015 -- he is here for a wound check and is bothered by a lot of odor from the wound. 05/16/2015 -- he has done very well, the odor and drainage is less with the wound dressing having been changed twice this week. He is here for his last application of Apligraf. 06/20/2015 -- an area superior to the previous wound has now opened up and this is a fairly superficial ulceration. 07/12/2015 -- he is here for his first application of Puraply. This is a sample and this is no charge. 07/18/2015 -- he is here for his second application of Puraply. This is a sample and this is no charge. 07/25/2015 -- he is here for his third application of Puraply. This is a sample and this is no charge. 08/01/2015 -- he is here for his fourth application of Puraply. This is a sample and this is no charge. 08/08/2015 -- he is here for his fifth application of  Puraply and this is a sample at no charge. 08/15/2015 -- he is here for his sixth application of Puraply and this is a sample at no charge. 08/29/2015 -- he is here for his eight application of Puraply and this is a sample at no charge. 09/05/2015 -- he is here for his nineth application of Puraply and this is a sample at no charge. 09/19/2015 -- this is the second week of using the collagen substitute and he is doing very well. 09/26/2015 -- this is the third week of using the collagen substitute. 10/03/2015 -- this is the fourth week of using the collagen substitute. 10/24/2015 --this is the seventh week of using the collagen substitute. 11/07/2015 -- last week he had not used the collagen substitute but today we have applied the eighth collagen substitute on his wound. 11/14/2015 -- he continues to have 2 small areas which are open and we will use the ninth application of the collagen substitute Objective HIEN, JARIWALA. ([id number]) Constitutional Pulse regular. Respirations normal and unlabored. Afebrile. Vitals Time Taken: 12:57 PM, Height: 68 in, Temperature: 98.0 F, Pulse: 64 bpm, Respiratory Rate: 18 breaths/min, Blood Pressure: 135/86 mmHg. Eyes Nonicteric. Reactive to light. Ears, Nose, Mouth, and Throat Lips, teeth, and gums WNL.Marland Kitchen Moist mucosa without lesions. Neck supple and nontender. No palpable supraclavicular or cervical adenopathy. Normal sized without goiter. Respiratory WNL.  No retractions.. Cardiovascular Pedal Pulses WNL. No clubbing, cyanosis or edema. Lymphatic No adneopathy. No adenopathy. No adenopathy. Musculoskeletal Adexa without tenderness or enlargement.. Digits and nails w/o clubbing, cyanosis, infection, petechiae, ischemia, or inflammatory conditions.Marland Kitchen Psychiatric Judgement and insight Intact.. No evidence of depression, anxiety, or agitation.. General Notes: the main wound has gone down significantly in size and there is a small satellite  wound which is minimally open. We will use the collagen substitute on both these wounds. Integumentary (Hair, Skin) No suspicious lesions. No crepitus or fluctuance. No peri-wound warmth or erythema. No masses.. Wound #5 status is Open. Original cause of wound was Gradually Appeared. The wound is located on the Right,Medial Lower Leg. The wound measures 0.7cm length x 0.6cm width x 0.1cm depth; 0.33cm^2 area and 0.033cm^3 volume. The wound is limited to skin breakdown. There is no tunneling or undermining noted. There is a medium amount of serosanguineous drainage noted. The wound margin is distinct with the outline attached to the wound base. There is large (67-100%) pink, pale granulation within the wound bed. There is no necrotic tissue within the wound bed. The periwound skin appearance exhibited: Moist. The periwound skin appearance did not exhibit: Callus, Crepitus, Excoriation, Fluctuance, Friable, Induration, Localized Edema, Rash, Scarring, Dry/Scaly, Maceration, Atrophie Blanche, Cyanosis, Ecchymosis, Hemosiderin Staining, Mottled, Pallor, Rubor, Erythema. Periwound temperature was noted as No Abnormality. 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 I have recommended that we get him his compression stockings as soon as possible and at this stage it may also be worth applying for lymphedema pumps as he has significant stage II lymphedema which has been treated appropriately with elevation and exercise and class I compression wrap, for several weeks. despite the conservative therapy he  still persists in having stage II lymphedema and it is only controlled with significant compression. Electronic Signature(s) Signed: 11/14/2015 1:41:38 PM By: Evlyn Kanner MD, FACS Entered By: Evlyn Kanner on 11/14/2015 13:41:38 Deryk, Bozman Chrissie Noa ([id number]) -------------------------------------------------------------------------------- SuperBill Details Patient Name: GRIGOR, LIPSCHUTZ. Date of Service: 11/14/2015 Medical Record Number: [id number] Patient Account Number: [id number] Date of Birth/Sex: [birth year] (80 y.o. 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 E66.01 Morbid (severe) obesity due to excess calories I89.0 Lymphedema, not elsewhere classified Facility Procedures CPT4 Code: [id number] Description: [phone] - WOUND CARE VISIT-LEV 2 EST PT Modifier: Quantity: 1 Physician Procedures CPT4 Code Description: [id number] - WC PHYS LEVEL 3 - EST PT ICD-10 Description Diagnosis I87.311 Chronic venous hypertension (idiopathic) with ulcer of 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 E66.01 Morbid (severe) obesity due  to excess calories I89.0 Lymphedema, not elsewhere classified Modifier: right lower s of muscle Quantity: 1 extremity Electronic Signature(s) Signed: 11/14/2015 1:42:04 PM By: Evlyn Kanner MD, FACS Entered By: Evlyn Kanner on 11/14/2015 13:42:03

## 2015-11-15 NOTE — Progress Notes (Signed)
[name], YURKOVICH ([id number]) Visit Report for 11/14/2015 Arrival Information Details Patient Name: [name], [name] Date of Service: 11/14/2015 12:45 PM Medical Record Number: [id number] Patient Account Number: [id number] Date of Birth/Sex: 1936-04-30 (80 y.o. Male) Treating RN: Huel Coventry Primary Care Physician: Dorothey Baseman Other Clinician: Referring Physician: Dorothey Baseman Treating Physician/Extender: Rudene Re in Treatment: 82 Visit Information History Since Last Visit Added or deleted any medications: No Patient Arrived: Wheel Chair Any new allergies or adverse reactions: No Arrival Time: 12:54 Had a fall or experienced change in No activities of daily living that may affect Accompanied By: self risk of falls: Transfer Assistance: None Signs or symptoms of abuse/neglect since last No Patient Identification Verified: Yes visito Secondary Verification Process Yes Hospitalized since last visit: No Completed: Has Dressing in Place as Prescribed: Yes Patient Requires Transmission-Based No Pain Present Now: No Precautions: Patient Has Alerts: No Electronic Signature(s) Signed: 11/14/2015 5:50:20 PM By: Elliot Gurney, RN, BSN, Kim RN, BSN Entered By: Elliot Gurney, RN, BSN, Kim on 11/14/2015 12:55:05 Tavarus, [place] Chrissie [name] ([id number]) -------------------------------------------------------------------------------- Clinic Level of Care Assessment Details Patient Name: [name], [name]. Date of Service: 11/14/2015 12:45 PM Medical Record Number: [id number] Patient Account Number: [id number] Date of Birth/Sex: Sep 23, 1935 (80 y.o. Male) Treating RN: Huel Coventry Primary Care Physician: Terance Hart, DAVID Other Clinician: Referring Physician: Terance Hart, DAVID Treating Physician/Extender: Rudene Re in Treatment: 62 Clinic Level of Care Assessment Items TOOL 4 Quantity Score  - Use when only an EandM is performed on FOLLOW-UP visit 0 ASSESSMENTS - Nursing Assessment /  Reassessment X - Reassessment of Co-morbidities (includes updates in patient status) 1 10  - Reassessment of Adherence to Treatment Plan 0 ASSESSMENTS - Wound and Skin Assessment / Reassessment X - Simple Wound Assessment / Reassessment - one wound 1 5  - Complex Wound Assessment / Reassessment - multiple wounds 0  - Dermatologic / Skin Assessment (not related to wound area) 0 ASSESSMENTS - Focused Assessment  - Circumferential Edema Measurements - multi extremities 0  - Nutritional Assessment / Counseling / Intervention 0  - Lower Extremity Assessment (monofilament, tuning fork, pulses) 0  - Peripheral Arterial Disease Assessment (using hand held doppler) 0 ASSESSMENTS - Ostomy and/or Continence Assessment and Care  - Incontinence Assessment and Management 0  - Ostomy Care Assessment and Management (repouching, etc.) 0 PROCESS - Coordination of Care X - Simple Patient / Family Education for ongoing care 1 15  - Complex (extensive) Patient / Family Education for ongoing care 0  - Staff obtains Chiropractor, Records, Test Results / Process Orders 0  - Staff telephones HHA, Nursing Homes / Clarify orders / etc 0  - Routine Transfer to another Facility (non-emergent condition) 0 XIONG, HAIDAR ([id number])  - Routine Hospital Admission (non-emergent condition) 0  - New Admissions / Manufacturing engineer / Ordering NPWT, Apligraf, etc. 0  - Emergency Hospital Admission (emergent condition) 0 X - Simple Discharge Coordination 1 10  - Complex (extensive) Discharge Coordination 0 PROCESS - Special Needs  - Pediatric / Minor Patient Management 0  - Isolation Patient Management 0  - Hearing / Language / Visual special needs 0  - Assessment of Community assistance (transportation, D/C planning, etc.) 0  - Additional assistance / Altered mentation 0  - Support Surface(s) Assessment (bed, cushion, seat, etc.) 0 INTERVENTIONS - Wound Cleansing /  Measurement X - Simple Wound Cleansing - one wound 1 5  - Complex Wound Cleansing - multiple wounds 0  - Wound Imaging (photographs - any number of  wounds) 0 []  - Wound Tracing (instead of photographs) 0 X - Simple Wound Measurement - one wound 1 5 []  - Complex Wound Measurement - multiple wounds 0 INTERVENTIONS - Wound Dressings X - Small Wound Dressing one or multiple wounds 1 10 []  - Medium Wound Dressing one or multiple wounds 0 []  - Large Wound Dressing one or multiple wounds 0 []  - Application of Medications - topical 0 []  - Application of Medications - injection 0 INTERVENTIONS - Miscellaneous []  - External ear exam 0 DYLLON, HENKEN ([id number]) []  - Specimen Collection (cultures, biopsies, blood, body fluids, etc.) 0 []  - Specimen(s) / Culture(s) sent or taken to Lab for analysis 0 X - Patient Transfer (multiple staff / Nurse, adult / Similar devices) 1 10 []  - Simple Staple / Suture removal (25 or less) 0 []  - Complex Staple / Suture removal (26 or more) 0 []  - Hypo / Hyperglycemic Management (close monitor of Blood Glucose) 0 []  - Ankle / Brachial Index (ABI) - do not check if billed separately 0 X - Vital Signs 1 5 Has the patient been seen at the hospital within the last three years: Yes Total Score: 75 Level Of Care: New/Established - Level 2 Electronic Signature(s) Signed: 11/14/2015 5:50:20 PM By: Elliot Gurney, RN, BSN, Kim RN, BSN Entered By: Elliot Gurney, RN, BSN, Kim on 11/14/2015 13:31:53 [name], [place] Chrissie [name] ([id number]) -------------------------------------------------------------------------------- Encounter Discharge Information Details Patient Name: [name], [name]. Date of Service: 11/14/2015 12:45 PM Medical Record Number: [id number] Patient Account Number: [id number] Date of Birth/Sex: 1935/12/01 (80 y.o. Male) Treating RN: Huel Coventry Primary Care Physician: Terance Hart, DAVID Other Clinician: Referring Physician: Dorothey Baseman Treating Physician/Extender:  Rudene Re in Treatment: 25 Encounter Discharge Information Items Discharge Pain Level: 0 Discharge Condition: Stable Ambulatory Status: Wheelchair Discharge Destination: Nursing Home Transportation: Private Auto Accompanied By: self Schedule Follow-up Appointment: Yes Medication Reconciliation completed and provided to Patient/Care Yes Kizzie Cotten: Provided on Clinical Summary of Care: 11/14/2015 Form Type Recipient Paper Patient [facility] Electronic Signature(s) Signed: 11/14/2015 1:37:31 PM By: Gwenlyn Perking Entered By: Gwenlyn Perking on 11/14/2015 13:37:31 Aerik, Polan Chrissie [name] ([id number]) -------------------------------------------------------------------------------- Lower Extremity Assessment Details Patient Name: [name]. Date of Service: 11/14/2015 12:45 PM Medical Record Number: [id number] Patient Account Number: [id number] Date of Birth/Sex: 05/08/1936 (80 y.o. Male) Treating RN: Huel Coventry Primary Care Physician: Terance Hart, DAVID Other Clinician: Referring Physician: Terance Hart, DAVID Treating Physician/Extender: Rudene Re in Treatment: 43 Edema Assessment Assessed: [Left: No] [Right: No] E[Left: dema] [Right: :] Calf Left: Right: Point of Measurement: 40 cm From Medial Instep cm 36 cm Ankle Left: Right: Point of Measurement: 12 cm From Medial Instep cm 25.5 cm Vascular Assessment Pulses: Posterior Tibial Dorsalis Pedis Doppler: [Right:Multiphasic] Extremity colors, hair growth, and conditions: Extremity Color: [Right:Hyperpigmented] Hair Growth on Extremity: [Right:No] Temperature of Extremity: [Right:Warm] Capillary Refill: [Right:> 3 seconds] Toe Nail Assessment Left: Right: Thick: Yes Discolored: Yes Deformed: No Improper Length and Hygiene: Yes Electronic Signature(s) Signed: 11/14/2015 5:50:20 PM By: Elliot Gurney, RN, BSN, Kim RN, BSN Entered By: Elliot Gurney, RN, BSN, Kim on 11/14/2015 13:05:34 Jaecob, [place] Chrissie [name]  ([id number]) -------------------------------------------------------------------------------- Multi-Disciplinary Care Plan Details Patient Name: BREK, REECE. Date of Service: 11/14/2015 12:45 PM Medical Record Number: [id number] Patient Account Number: [id number] Date of Birth/Sex: September 06, 1935 (80 y.o. Male) Treating RN: Huel Coventry Primary Care Physician: Dorothey Baseman Other Clinician: Referring Physician: Dorothey Baseman Treating Physician/Extender: Rudene Re in Treatment: 69 Active Inactive Orientation to the Wound Care Program Nursing Diagnoses: Knowledge deficit related to the wound healing  center program Goals: Patient/caregiver will verbalize understanding of the Wound Healing Center Program Date Initiated: 01/15/2015 Goal Status: Active Interventions: Provide education on orientation to the wound center Notes: Venous Leg Ulcer Nursing Diagnoses: Knowledge deficit related to disease process and management Potential for venous Insuffiency (use before diagnosis confirmed) Goals: Patient will maintain optimal edema control Date Initiated: 01/15/2015 Goal Status: Active Patient/caregiver will verbalize understanding of disease process and disease management Date Initiated: 01/15/2015 Goal Status: Active Verify adequate tissue perfusion prior to therapeutic compression application Date Initiated: 01/15/2015 Goal Status: Active Interventions: Assess peripheral edema status every visit. Compression as ordered Provide education on venous insufficiency RANCE, SMITHSON ([id number]) Treatment Activities: Test ordered outside of clinic : 10/24/2015 Therapeutic compression applied : 10/24/2015 Venous Duplex Doppler : 10/24/2015 Notes: Wound/Skin Impairment Nursing Diagnoses: Impaired tissue integrity Knowledge deficit related to ulceration/compromised skin integrity Goals: Patient/caregiver will verbalize understanding of skin care regimen Date Initiated:  01/15/2015 Goal Status: Active Ulcer/skin breakdown will have a volume reduction of 30% by week 4 Date Initiated: 01/15/2015 Goal Status: Active Ulcer/skin breakdown will have a volume reduction of 50% by week 8 Date Initiated: 01/15/2015 Goal Status: Active Ulcer/skin breakdown will have a volume reduction of 80% by week 12 Date Initiated: 01/15/2015 Goal Status: Active Ulcer/skin breakdown will heal within 14 weeks Date Initiated: 01/15/2015 Goal Status: Active Interventions: Assess patient/caregiver ability to perform ulcer/skin care regimen upon admission and as needed Assess ulceration(s) every visit Provide education on ulcer and skin care Notes: Electronic Signature(s) Signed: 11/14/2015 5:50:20 PM By: Elliot Gurney, RN, BSN, Kim RN, BSN Entered By: Elliot Gurney, RN, BSN, Kim on 11/14/2015 13:16:37 Uriah, [place] Chrissie [name] ([id number]) -------------------------------------------------------------------------------- Pain Assessment Details Patient Name: RUE, TINNEL. Date of Service: 11/14/2015 12:45 PM Medical Record Number: [id number] Patient Account Number: [id number] Date of Birth/Sex: [birth year] (80 y.o. Male) Treating RN: Huel Coventry Primary Care Physician: Dorothey Baseman Other Clinician: Referring Physician: Dorothey Baseman Treating Physician/Extender: Rudene Re in Treatment: 34 Active Problems Location of Pain Severity and Description of Pain Patient Has Paino No Site Locations Pain Management and Medication Current Pain Management: Electronic Signature(s) Signed: 11/14/2015 5:50:20 PM By: Elliot Gurney, RN, BSN, Kim RN, BSN Entered By: Elliot Gurney, RN, BSN, Kim on 11/14/2015 12:54:37 [name] ([id number]) -------------------------------------------------------------------------------- Patient/Caregiver Education Details Patient Name: KINSLER, SOEDER. Date of Service: 11/14/2015 12:45 PM Medical Record Number: [id number] Patient Account Number: [id number] Date of  Birth/Gender: [birth year] (80 y.o. Male) Treating RN: Huel Coventry Primary Care Physician: Terance Hart, DAVID Other Clinician: Referring Physician: Dorothey Baseman Treating Physician/Extender: Rudene Re in Treatment: 11 Education Assessment Education Provided To: Patient Education Topics Provided Venous: Handouts: Controlling Swelling with Multilayered Compression Wraps Methods: Demonstration, Explain/Verbal Responses: State content correctly Electronic Signature(s) Signed: 11/14/2015 5:50:20 PM By: Elliot Gurney, RN, BSN, Kim RN, BSN Entered By: Elliot Gurney, RN, BSN, Kim on 11/14/2015 13:41:46 Andre, [place] Chrissie [name] ([id number]) -------------------------------------------------------------------------------- Wound Assessment Details Patient Name: QAMAR, AUGHENBAUGH. Date of Service: 11/14/2015 12:45 PM Medical Record Number: [id number] Patient Account Number: [id number] Date of Birth/Sex: 29-May-1936 (80 y.o. Male) Treating RN: Huel Coventry Primary Care Physician: Dorothey Baseman Other Clinician: Referring Physician: Terance Hart, DAVID Treating Physician/Extender: Rudene Re in Treatment: 43 Wound Status Wound Number: 5 Primary Venous Leg Ulcer Etiology: Wound Location: Right Lower Leg - Medial Wound Status: Open Wounding Event: Gradually Appeared Comorbid Cataracts, Anemia, Hypertension, Date Acquired: 11/13/2014 History: Osteoarthritis Weeks Of Treatment: 43 Clustered Wound: No Photos Photo Uploaded By: Elliot Gurney, RN, BSN, Kim on 11/14/2015 16:12:16 Wound Measurements Length: (cm) 0.7 Width: (cm) 0.6  Depth: (cm) 0.1 Area: (cm) 0.33 Volume: (cm) 0.033 % Reduction in Area: 99.7% % Reduction in Volume: 99.9% Epithelialization: Medium (34-66%) Tunneling: No Undermining: No Wound Description Full Thickness Without Exposed Classification: Support Structures Wound Margin: Distinct, outline attached Exudate Medium Amount: Exudate Type: Serosanguineous Exudate Color: red,  brown Foul Odor After Cleansing: No Wound Bed Granulation Amount: Large (67-100%) Exposed Structure Granulation Quality: Pink, Pale Fascia Exposed: No Necrotic Amount: None Present (0%) Fat Layer Exposed: No VALERIAN, JEWEL ([id number]) Tendon Exposed: No Muscle Exposed: No Joint Exposed: No Bone Exposed: No Limited to Skin Breakdown Periwound Skin Texture Texture Color No Abnormalities Noted: No No Abnormalities Noted: No Callus: No Atrophie Blanche: No Crepitus: No Cyanosis: No Excoriation: No Ecchymosis: No Fluctuance: No Erythema: No Friable: No Hemosiderin Staining: No Induration: No Mottled: No Localized Edema: No Pallor: No Rash: No Rubor: No Scarring: No Temperature / Pain Moisture Temperature: No Abnormality No Abnormalities Noted: No Dry / Scaly: No Maceration: No Moist: Yes Wound Preparation Ulcer Cleansing: Other: soap and water, Topical Anesthetic Applied: None Treatment Notes Wound #5 (Right, Medial Lower Leg) 1. Cleansed with: Clean wound with Normal Saline 2. Anesthetic Topical Lidocaine 4% cream to wound bed prior to debridement 4. Dressing Applied: Other dressing (specify in notes) 5. Secondary Dressing Applied ABD Pad 7. Secured with 4-Layer Compression System - Right Lower Extremity Notes collagen dressing Electronic Signature(s) Signed: 11/14/2015 5:50:20 PM By: Elliot Gurney, RN, BSN, Kim RN, BSN Entered By: Elliot Gurney, RN, BSN, Kim on 11/14/2015 13:08:39 LEJUAN, BOTTO ([id number]) JOURDAN, DURBIN ([id number]) -------------------------------------------------------------------------------- Wound Assessment Details Patient Name: MANSON, LUCKADOO. Date of Service: 11/14/2015 12:45 PM Medical Record Number: [id number] Patient Account Number: [id number] Date of Birth/Sex: August 26, 1935 (80 y.o. Male) Treating RN: Huel Coventry Primary Care Physician: Terance Hart, DAVID Other Clinician: Referring Physician: Terance Hart, DAVID Treating  Physician/Extender: Rudene Re in Treatment: 59 Wound Status Wound Number: 7 Primary Venous Leg Ulcer Etiology: Wound Location: Right Lower Leg - Proximal Wound Status: Open Wounding Event: Blister Comorbid Cataracts, Anemia, Hypertension, Date Acquired: 11/11/2015 History: Osteoarthritis Weeks Of Treatment: 0 Clustered Wound: No Wound Measurements Length: (cm) 0.5 Width: (cm) 0.5 Depth: (cm) 0.1 Area: (cm) 0.196 Volume: (cm) 0.02 % Reduction in Area: % Reduction in Volume: Epithelialization: Small (1-33%) Tunneling: No Undermining: No Wound Description Classification: Partial Thickness Wound Margin: Flat and Intact Exudate Amount: Small Exudate Type: Serous Exudate Color: amber Wound Bed Granulation Amount: Small (1-33%) Exposed Structure Granulation Quality: Red Fascia Exposed: No Necrotic Amount: None Present (0%) Fat Layer Exposed: No Tendon Exposed: No Muscle Exposed: No Joint Exposed: No Bone Exposed: No Limited to Skin Breakdown Periwound Skin Texture Texture Color No Abnormalities Noted: No No Abnormalities Noted: No Callus: No Atrophie Blanche: No Crepitus: No Cyanosis: No Excoriation: No Ecchymosis: No Fluctuance: No Erythema: No PHILIP, KOTLYAR ([id number]) Friable: No Hemosiderin Staining: No Induration: No Mottled: No Localized Edema: No Pallor: No Rash: No Rubor: No Scarring: No Moisture No Abnormalities Noted: No Dry / Scaly: No Maceration: No Moist: Yes Wound Preparation Ulcer Cleansing: Rinsed/Irrigated with Saline Topical Anesthetic Applied: None Treatment Notes Wound #7 (Right, Proximal Lower Leg) 1. Cleansed with: Clean wound with Normal Saline 4. Dressing Applied: Other dressing (specify in notes) 5. Secondary Dressing Applied ABD Pad 7. Secured with 4-Layer Compression System - Right Lower Extremity Notes Collagen Electronic Signature(s) Signed: 11/14/2015 5:50:20 PM By: Elliot Gurney, RN, BSN, Kim RN,  BSN Entered By: Elliot Gurney, RN, BSN, Kim on 11/14/2015 13:40:22 Storm, Sovine Chrissie [name] ([id number]) -------------------------------------------------------------------------------- Vitals Details Patient  Name: Haman, Rawlins P. Date of Service: 11/14/2015 12:45 PM Medical Record Number: [id number] Patient Account Number: [id number] Date of Birth/Sex: 08/27/1935 (80 y.o. Male) Treating RN: Huel Coventry Primary Care Physician: Terance Hart, DAVID Other Clinician: Referring Physician: Terance Hart, DAVID Treating Physician/Extender: Rudene Re in Treatment: 46 Vital Signs Time Taken: 12:57 Temperature (F): 98.0 Height (in): 68 Pulse (bpm): 64 Respiratory Rate (breaths/min): 18 Blood Pressure (mmHg): 135/86 Reference Range: 80 - 120 mg / dl Electronic Signature(s) Signed: 11/14/2015 5:50:20 PM By: Elliot Gurney, RN, BSN, Kim RN, BSN Entered By: Elliot Gurney, RN, BSN, Kim on 11/14/2015 12:58:49

## 2015-11-21 ENCOUNTER — Encounter: Payer: Medicare Other | Admitting: Surgery

## 2015-11-21 DIAGNOSIS — I87311 Chronic venous hypertension (idiopathic) with ulcer of right lower extremity: Secondary | ICD-10-CM | POA: Diagnosis not present

## 2015-11-22 NOTE — Progress Notes (Signed)
SHIVA, SAHAGIAN ([id number]) Visit Report for 11/21/2015 Arrival Information Details Patient Name: [name], [name] Date of Service: 11/21/2015 10:45 AM Medical Record Number: [id number] Patient Account Number: [id number] Date of Birth/Sex: 1935/10/01 (80 y.o. Male) Treating RN: Clover Mealy, RN, BSN, [place] Sink Primary Care Physician: Terance Hart, DAVID Other Clinician: Referring Physician: Terance Hart, DAVID Treating Physician/Extender: Rudene Re in Treatment: 44 Visit Information History Since Last Visit Added or deleted any medications: No Patient Arrived: Wheel Chair Had a fall or experienced change in No activities of daily living that may affect Arrival Time: 10:50 risk of falls: Accompanied By: self Signs or symptoms of abuse/neglect since last No Transfer Assistance: None visito Patient Identification Verified: Yes Hospitalized since last visit: No Secondary Verification Process Yes Has Dressing in Place as Prescribed: Yes Completed: Has Compression in Place as Prescribed: Yes Patient Requires Transmission-Based No Pain Present Now: No Precautions: Patient Has Alerts: No Electronic Signature(s) Signed: 11/21/2015 3:29:37 PM By: Elpidio Eric BSN, RN Entered By: Elpidio Eric on 11/21/2015 10:53:40 [name] ([id number]) -------------------------------------------------------------------------------- Encounter Discharge Information Details Patient Name: [name] Date of Service: 11/21/2015 10:45 AM Medical Record Number: [id number] Patient Account Number: [id number] Date of Birth/Sex: Dec 15, 1935 (80 y.o. Male) Treating RN: Clover Mealy, RN, BSN, [place] Sink Primary Care Physician: Terance Hart, DAVID Other Clinician: Referring Physician: Terance Hart, DAVID Treating Physician/Extender: Rudene Re in Treatment: 58 Encounter Discharge Information Items Discharge Pain Level: 0 Discharge Condition: Stable Ambulatory Status: Wheelchair Discharge Destination: Nursing  Home Transportation: Other Accompanied By: self Schedule Follow-up Appointment: No Medication Reconciliation completed and provided to Patient/Care No [name]: Provided on Clinical Summary of Care: 11/21/2015 Form Type Recipient Paper Patient [facility] Electronic Signature(s) Signed: 11/21/2015 12:28:12 PM By: Elpidio Eric BSN, RN Previous Signature: 11/21/2015 11:35:32 AM Version By: Gwenlyn Perking Entered By: Elpidio Eric on 11/21/2015 12:28:12 [name], [name] ([id number]) -------------------------------------------------------------------------------- Lower Extremity Assessment Details Patient Name: [name] Date of Service: 11/21/2015 10:45 AM Medical Record Number: [id number] Patient Account Number: [id number] Date of Birth/Sex: Sep 20, 1935 (80 y.o. Male) Treating RN: Clover Mealy, RN, BSN, [place] Sink Primary Care Physician: Terance Hart, DAVID Other Clinician: Referring Physician: Terance Hart, DAVID Treating Physician/Extender: Rudene Re in Treatment: 44 Edema Assessment Assessed: [Left: No] [Right: No] E[Left: dema] [Right: :] Calf Left: Right: Point of Measurement: 40 cm From Medial Instep cm 36 cm Ankle Left: Right: Point of Measurement: 12 cm From Medial Instep cm 25.4 cm Vascular Assessment Pulses: Posterior Tibial Dorsalis Pedis Palpable: [Right:Yes] Extremity colors, hair growth, and conditions: Extremity Color: [Right:Hyperpigmented] Hair Growth on Extremity: [Right:No] Temperature of Extremity: [Right:Warm] Capillary Refill: [Right:< 3 seconds] Toe Nail Assessment Left: Right: Thick: Yes Discolored: Yes Deformed: Yes Improper Length and Hygiene: Yes Electronic Signature(s) Signed: 11/21/2015 3:29:37 PM By: Elpidio Eric BSN, RN Entered By: Elpidio Eric on 11/21/2015 10:54:18 [name] ([id number]) -------------------------------------------------------------------------------- Multi Wound Chart Details Patient Name: [name] Date of  Service: 11/21/2015 10:45 AM Medical Record Number: [id number] Patient Account Number: [id number] Date of Birth/Sex: March 10, 1936 (80 y.o. Male) Treating RN: Clover Mealy, RN, BSN, [place] Sink Primary Care Physician: Terance Hart, DAVID Other Clinician: Referring Physician: Terance Hart, DAVID Treating Physician/Extender: Rudene Re in Treatment: 44 Vital Signs Height(in): 68 Pulse(bpm): 67 Weight(lbs): Blood Pressure 124/47 (mmHg): Body Mass Index(BMI): Temperature(F): 98.4 Respiratory Rate 18 (breaths/min): Photos: [5:No Photos] [7:No Photos] [N/A:N/A] Wound Location: [5:Right, Medial Lower Leg] [7:Right, Proximal Lower Leg N/A] Wounding Event: [5:Gradually Appeared] [7:Blister] [N/A:N/A] Primary Etiology: [5:Venous Leg Ulcer] [7:Venous Leg Ulcer] [N/A:N/A] Date Acquired: [5:11/13/2014] [7:11/11/2015] [N/A:N/A] Weeks of Treatment: [5:44] [7:1] [N/A:N/A] Wound  Status: [5:Open] [7:Open] [N/A:N/A] Measurements L x W x D 2.5x1x0.1 [7:0x0x0] [N/A:N/A] (cm) Area (cm) : [5:1.963] [7:0] [N/A:N/A] Volume (cm) : [5:0.196] [7:0] [N/A:N/A] % Reduction in Area: [5:98.10%] [7:100.00%] [N/A:N/A] % Reduction in Volume: 99.40% [7:100.00%] [N/A:N/A] Classification: [5:Full Thickness Without Exposed Support Structures] [7:Partial Thickness] [N/A:N/A] Periwound Skin Texture: No Abnormalities Noted [7:No Abnormalities Noted] [N/A:N/A] Periwound Skin [5:No Abnormalities Noted] [7:No Abnormalities Noted] [N/A:N/A] Moisture: Periwound Skin Color: No Abnormalities Noted [7:No Abnormalities Noted] [N/A:N/A] Tenderness on [5:No] [7:No] [N/A:N/A] Treatment Notes Electronic Signature(s) Signed: 11/21/2015 3:29:37 PM By: Elpidio Eric BSN, RN Entered By: Elpidio Eric on 11/21/2015 11:33:09 [name], [name] ([id number]) [name], [name] ([id number]) -------------------------------------------------------------------------------- Multi-Disciplinary Care Plan Details Patient Name: [name], COUTS. Date of  Service: 11/21/2015 10:45 AM Medical Record Number: [id number] Patient Account Number: [id number] Date of Birth/Sex: April 19, 1936 (80 y.o. Male) Treating RN: Clover Mealy, RN, BSN, [place] Sink Primary Care Physician: Terance Hart, DAVID Other Clinician: Referring Physician: Terance Hart, DAVID Treating Physician/Extender: Rudene Re in Treatment: 32 Active Inactive Orientation to the Wound Care Program Nursing Diagnoses: Knowledge deficit related to the wound healing center program Goals: Patient/caregiver will verbalize understanding of the Wound Healing Center Program Date Initiated: 01/15/2015 Goal Status: Active Interventions: Provide education on orientation to the wound center Notes: Venous Leg Ulcer Nursing Diagnoses: Knowledge deficit related to disease process and management Potential for venous Insuffiency (use before diagnosis confirmed) Goals: Patient will maintain optimal edema control Date Initiated: 01/15/2015 Goal Status: Active Patient/caregiver will verbalize understanding of disease process and disease management Date Initiated: 01/15/2015 Goal Status: Active Verify adequate tissue perfusion prior to therapeutic compression application Date Initiated: 01/15/2015 Goal Status: Active Interventions: Assess peripheral edema status every visit. Compression as ordered Provide education on venous insufficiency [name], [name] ([id number]) Treatment Activities: Test ordered outside of clinic : 10/24/2015 Therapeutic compression applied : 10/24/2015 Venous Duplex Doppler : 10/24/2015 Notes: Wound/Skin Impairment Nursing Diagnoses: Impaired tissue integrity Knowledge deficit related to ulceration/compromised skin integrity Goals: Patient/caregiver will verbalize understanding of skin care regimen Date Initiated: 01/15/2015 Goal Status: Active Ulcer/skin breakdown will have a volume reduction of 30% by week 4 Date Initiated: 01/15/2015 Goal Status: Active Ulcer/skin breakdown  will have a volume reduction of 50% by week 8 Date Initiated: 01/15/2015 Goal Status: Active Ulcer/skin breakdown will have a volume reduction of 80% by week 12 Date Initiated: 01/15/2015 Goal Status: Active Ulcer/skin breakdown will heal within 14 weeks Date Initiated: 01/15/2015 Goal Status: Active Interventions: Assess patient/caregiver ability to perform ulcer/skin care regimen upon admission and as needed Assess ulceration(s) every visit Provide education on ulcer and skin care Notes: Electronic Signature(s) Signed: 11/21/2015 3:29:37 PM By: Elpidio Eric BSN, RN Entered By: Elpidio Eric on 11/21/2015 11:33:00 [name] ([id number]) -------------------------------------------------------------------------------- Patient/Caregiver Education Details Patient Name: [name] Date of Service: 11/21/2015 10:45 AM Medical Record Number: [id number] Patient Account Number: [id number] Date of Birth/Gender: 1935/11/22 (80 y.o. Male) Treating RN: Clover Mealy, RN, BSN, [place] Sink Primary Care Physician: Terance Hart, DAVID Other Clinician: Referring Physician: Terance Hart DAVID Treating Physician/Extender: Rudene Re in Treatment: 38 Education Assessment Education Provided To: Patient Education Topics Provided Venous: Methods: Explain/Verbal Responses: State content correctly Welcome To The Wound Care Center: Methods: Explain/Verbal Responses: State content correctly Wound/Skin Impairment: Methods: Explain/Verbal Responses: State content correctly Electronic Signature(s) Signed: 11/21/2015 12:28:31 PM By: Elpidio Eric BSN, RN Entered By: Elpidio Eric on 11/21/2015 12:28:31 [name], [name] ([id number]) -------------------------------------------------------------------------------- Wound Assessment Details Patient Name: [name] Date of Service: 11/21/2015 10:45 AM Medical Record Number: [id number] Patient Account Number:  [id number] Date of Birth/Sex: Aug 31, 1935 (81  y.o. Male) Treating RN: Afful, RN, BSN, [place] Sink Primary Care Physician: Terance Hart, DAVID Other Clinician: Referring Physician: Terance Hart, DAVID Treating Physician/Extender: Rudene Re in Treatment: 44 Wound Status Wound Number: 5 Primary Etiology: Venous Leg Ulcer Wound Location: Right, Medial Lower Leg Wound Status: Open Wounding Event: Gradually Appeared Date Acquired: 11/13/2014 Weeks Of Treatment: 44 Clustered Wound: No Photos Photo Uploaded By: Elpidio Eric on 11/21/2015 14:41:55 Wound Measurements Length: (cm) 2.5 Width: (cm) 1 Depth: (cm) 0.1 Area: (cm) 1.963 Volume: (cm) 0.196 % Reduction in Area: 98.1% % Reduction in Volume: 99.4% Wound Description Full Thickness Without Exposed Classification: Support Structures Periwound Skin Texture Texture Color No Abnormalities Noted: No No Abnormalities Noted: No Moisture No Abnormalities Noted: No Treatment Notes Wound #5 (Right, Medial Lower Leg) [name], LUPIA. ([id number]) 1. Cleansed with: Clean wound with Normal Saline 2. Anesthetic Topical Lidocaine 4% cream to wound bed prior to debridement 4. Dressing Applied: Other dressing (specify in notes) 5. Secondary Dressing Applied ABD Pad 7. Secured with 3 Layer Compression System - Right Lower Extremity Notes collagen dressing Electronic Signature(s) Signed: 11/21/2015 3:29:37 PM By: Elpidio Eric BSN, RN Entered By: Elpidio Eric on 11/21/2015 11:02:16 [name], [name] ([id number]) -------------------------------------------------------------------------------- Wound Assessment Details Patient Name: [name], [name] Date of Service: 11/21/2015 10:45 AM Medical Record Number: [id number] Patient Account Number: [id number] Date of Birth/Sex: 06-16-1936 (80 y.o. Male) Treating RN: Clover Mealy, RN, BSN, [place] Primary Care Physician: Terance Hart, DAVID Other Clinician: Referring Physician: Terance Hart, DAVID Treating Physician/Extender: Rudene Re in  Treatment: 44 Wound Status Wound Number: 7 Primary Etiology: Venous Leg Ulcer Wound Location: Right, Proximal Lower Leg Wound Status: Open Wounding Event: Blister Date Acquired: 11/11/2015 Weeks Of Treatment: 1 Clustered Wound: No Photos Photo Uploaded By: Elpidio Eric on 11/21/2015 14:42:07 Wound Measurements Length: (cm) 0 % Reduction i Width: (cm) 0 % Reduction i Depth: (cm) 0 Area: (cm) 0 Volume: (cm) 0 n Area: 100% n Volume: 100% Wound Description Classification: Partial Thickness Periwound Skin Texture Texture Color No Abnormalities Noted: No No Abnormalities Noted: No Moisture No Abnormalities Noted: No Treatment Notes Wound #7 (Right, Proximal Lower Leg) 1. Cleansed with: [name], [name] ([id number]) Clean wound with Normal Saline 2. Anesthetic Topical Lidocaine 4% cream to wound bed prior to debridement 4. Dressing Applied: Other dressing (specify in notes) 5. Secondary Dressing Applied ABD Pad 7. Secured with 3 Layer Compression System - Right Lower Extremity Notes collagen dressing Electronic Signature(s) Signed: 11/21/2015 3:29:37 PM By: Elpidio Eric BSN, RN Entered By: Elpidio Eric on 11/21/2015 11:02:16 ALIX, DEDIOS ([id number]) -------------------------------------------------------------------------------- Vitals Details Patient Name: [name] Date of Service: 11/21/2015 10:45 AM Medical Record Number: [id number] Patient Account Number: [id number] Date of Birth/Sex: May 26, 1936 (80 y.o. Male) Treating RN: Clover Mealy, RN, BSN, [place] Primary Care Physician: Terance Hart, DAVID Other Clinician: Referring Physician: Terance Hart, DAVID Treating Physician/Extender: Rudene Re in Treatment: 44 Vital Signs Time Taken: 10:54 Temperature (F): 98.4 Height (in): 68 Pulse (bpm): 67 Respiratory Rate (breaths/min): 18 Blood Pressure (mmHg): 124/47 Reference Range: 80 - 120 mg / dl Electronic Signature(s) Signed: 11/21/2015 3:29:37 PM By:  Elpidio Eric BSN, RN Entered By: Elpidio Eric on 11/21/2015 10:54:45

## 2015-11-22 NOTE — Progress Notes (Signed)
JOYCE, LECKEY ([id number]) Visit Report for 11/21/2015 Chief Complaint Document Details Patient Name: [name], [name] Date of Service: 11/21/2015 10:45 AM Medical Record Number: [id number] Patient Account Number: [id number] Date of Birth/Sex: 10-03-1935 (80 y.o. Male) Treating RN: Clover Mealy, RN, BSN, [place] Sink Primary Care Physician: Terance Hart, DAVID Other Clinician: Referring Physician: Dorothey Baseman Treating Physician/Extender: Rudene Re in Treatment: 22 Information Obtained from: Patient Chief Complaint Patient presents for treatment of an open ulcer due to venous insufficiency. The patient has had a open wound on his right medial ankle for at least 3 years and was last seen in the wound clinic in February 2015. He was lost to follow-up after that. Electronic Signature(s) Signed: 11/21/2015 11:38:39 AM By: Evlyn Kanner MD, FACS Entered By: Evlyn Kanner on 11/21/2015 11:38:38 TRAYON, KRANTZ ([id number]) -------------------------------------------------------------------------------- HPI Details Patient Name: [name] Date of Service: 11/21/2015 10:45 AM Medical Record Number: [id number] Patient Account Number: [id number] Date of Birth/Sex: [birth year] (80 y.o. Male) Treating RN: Clover Mealy, RN, BSN, [place] Sink Primary Care Physician: Terance Hart, DAVID Other Clinician: Referring Physician: Terance Hart, DAVID Treating Physician/Extender: Rudene Re in Treatment: 52 History of Present Illness Location: right lower extremity ulceration Quality: Patient reports experiencing a dull pain to affected area(s). Severity: Patient states wound are getting worse. 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 Modifying Factors: Consults to this date include:has received treatment in the wound center in the past but stopped coming here since February 2015 Associated Signs and Symptoms: Patient reports having difficulty standing for long periods. HPI Description: 80 year old male who is known to have progressive weakness and has been in a nursing home for a while has had chronic lower extremity edema both legs and a large open wound on the right lower extremity which is has at least for about 3-4 years. The patient was seen in the wound clinic before and has been treated until February 2015 when he was lost to follow-up. Past medical history is significant for hypertension, gout, venous stasis ulcers, Parkinson's [name], constipation. He's not been a diabetic but his last hemoglobin A1c was 6 in March 2015. There is documentation that he's had endovenous ablation of bilateral varicose veins but we have no documentation about this and this may be done at least 4-5 years ago. 01/22/2015 -- he has his vascular test is scheduled for this afternoon. 01/29/2015 -- the patient's vascular test was canceled last week because he was unable to get onto the examining bed at [facility]. His Unna's boot was not applied either and the patient had a dressing placed by home health. Today when his dressing was removed we found a lot of maggots in his right lower extremity. 02/07/2015 -- Was seen in the vascular office by the PA and she noted that a bilateral venous duplex study did not show any DVT, SVT or reflux bilaterally. The patient was advised to continue with Unna's boot and would at some stage to require graduated compression stockings of the 20-30 mmHg variety. In the future lymphedema pumps would also be considered. 02/14/2015 -- his dressing is smelling quite a bit and there is a discoloration of the wound suggestive of an infection. Deep tissue cultures will  be taken today. 02/21/2015 --  the culture is back and it has growth moderate growth of Proteus and gram-negative rods sensitive to sulfa, ampicillin, cephazolin, Zosyn. He is already on doxycycline and his wound is looking clean and hence we will continue with this. 02/28/2015 -- he's been doing fine still on his antibiotics and has no fresh issues. 03/07/2015 -- I was told that because he lives in a nursing home the Apligraf was denied by his insurance company. 03/14/2015 -- we are awaiting samples and a trial of Puraply to be tried for his ulceration. 03/21/2015 -- his Apligraf was approved and he is here for his first application of Apligraf 03/28/2015 -- his wound has had quite a bit of secretion and needs to be changed and hence I will use the VANDELL, GOULET. ([id number]) second application of Apligraf. 04/04/2015 -- he is here for a wound check but has a lot of secretions and hence his dressing needs to be taken down. 04/09/2015 -- he is here for his third application of Apligraf 04/18/2015 -- he is here for a wound check and though he does not have any overt infection he always has a foul odor to his leg. 04/25/2015 -- his wound has been doing much better and he has minimal drainage and the size is smaller. Of note he is on doxycycline. 05/02/2015 -- he is here for his fourth application of Apligraf 05/09/2015 -- he is here for a wound check and is bothered by a lot of odor from the wound. 05/16/2015 -- he has done very well, the odor and drainage is less with the wound dressing having been changed twice this week. He is here for his last application of Apligraf. 06/20/2015 -- an area superior to the previous wound has now opened up and this is a fairly superficial ulceration. 07/12/2015 -- he is here for his first application of Puraply. This is a sample and this is no charge. 07/18/2015 -- he is here for his second application of Puraply. This is a sample and this is no charge. 07/25/2015 -- he is here for  his third application of Puraply. This is a sample and this is no charge. 08/01/2015 -- he is here for his fourth application of Puraply. This is a sample and this is no charge. 08/08/2015 -- he is here for his fifth application of Puraply and this is a sample at no charge. 08/15/2015 -- he is here for his sixth application of Puraply and this is a sample at no charge. 08/29/2015 -- he is here for his eight application of Puraply and this is a sample at no charge. 09/05/2015 -- he is here for his nineth application of Puraply and this is a sample at no charge. 09/19/2015 -- this is the second week of using the collagen substitute and he is doing very well. 09/26/2015 -- this is the third week of using the collagen substitute. 10/03/2015 -- this is the fourth week of using the collagen substitute. 10/24/2015 --this is the seventh week of using the collagen substitute. 11/07/2015 -- last week he had not used the collagen substitute but today we have applied the eighth collagen substitute on his wound. 11/14/2015 -- he continues to have 2 small areas which are open and we will use the ninth application of the collagen substitute 11/21/2015 -- may continue to use the collagen substitute as he has tiny area still open. We have been trying to work with his home health or his nursing home  regarding getting this from the vendor Electronic Signature(s) Signed: 11/21/2015 11:40:32 AM By: Evlyn Kanner MD, FACS Entered By: Evlyn Kanner on 11/21/2015 11:40:32 YUEPHENG, SCHALLER ([id number]) -------------------------------------------------------------------------------- Physical Exam Details Patient Name: DEMARQUES, PILZ. Date of Service: 11/21/2015 10:45 AM Medical Record Number: [id number] Patient Account Number: [id number] Date of Birth/Sex: [birth year] (80 y.o. Male) Treating RN: Clover Mealy, RN, BSN, [place] Sink Primary Care Physician: Terance Hart, DAVID Other Clinician: Referring Physician: Terance Hart,  DAVID Treating Physician/Extender: Rudene Re in Treatment: 44 Constitutional . Pulse regular. Respirations normal and unlabored. Afebrile. . Eyes Nonicteric. Reactive to light. Ears, Nose, Mouth, and Throat Lips, teeth, and gums WNL.Marland Kitchen Moist mucosa without lesions. Neck supple and nontender. No palpable supraclavicular or cervical adenopathy. Normal sized without goiter. Respiratory WNL. No retractions.. Cardiovascular Pedal Pulses WNL. No clubbing, cyanosis or edema. Lymphatic No adneopathy. No adenopathy. No adenopathy. Musculoskeletal Adexa without tenderness or enlargement.. Digits and nails w/o clubbing, cyanosis, infection, petechiae, ischemia, or inflammatory conditions.. Integumentary (Hair, Skin) No suspicious lesions. No crepitus or fluctuance. No peri-wound warmth or erythema. No masses.Marland Kitchen Psychiatric Judgement and insight Intact.. No evidence of depression, anxiety, or agitation.. Notes the leg is looking quite skinny and the lymphedema has gone down significantly. There are couple of small open areas a few millimeters in diameter and will continue to use a collagen substitute. Electronic Signature(s) Signed: 11/21/2015 11:41:25 AM By: Evlyn Kanner MD, FACS Entered By: Evlyn Kanner on 11/21/2015 11:41:25 AIRIK, GOODLIN ([id number]) -------------------------------------------------------------------------------- Physician Orders Details Patient Name: NIKOLAI, WILCZAK. Date of Service: 11/21/2015 10:45 AM Medical Record Number: [id number] Patient Account Number: [id number] Date of Birth/Sex: 12/21/1935 (80 y.o. Male) Treating RN: Clover Mealy, RN, BSN, [place] Sink Primary Care Physician: Terance Hart, DAVID Other Clinician: Referring Physician: Terance Hart DAVID Treating Physician/Extender: Rudene Re in Treatment: 5 Verbal / Phone Orders: Yes Clinician: Afful, RN, BSN, Rita Read Back and Verified: Yes Diagnosis Coding Wound Cleansing Wound #5 Right,Medial  Lower Leg o Cleanse wound with mild soap and water o No tub bath. Skin Barriers/Peri-Wound Care Wound #5 Right,Medial Lower Leg o Moisturizing lotion Primary Wound Dressing Wound #5 Right,Medial Lower Leg o Other: - collagen dressing applied by Dr Meyer Russel today in clinic Secondary Dressing Wound #5 Right,Medial Lower Leg o ABD pad o Mepitel One - over collagen Dressing Change Frequency Wound #5 Right,Medial Lower Leg o Change dressing every week Follow-up Appointments Wound #5 Right,Medial Lower Leg o Return Appointment in 1 week. Edema Control Wound #5 Right,Medial Lower Leg o 3 Layer Compression System - Right Lower Extremity o Other: - SNF to provide patient with bilateral 20-67mmHg compression hose to be worn daily. Start left leg now and right leg once wound heals. This is to be worn EVERY day so multiple pairs should be provided. o Other: - SF to order and initiate lymphedema pumps Wound #7 Right,Proximal Lower Leg GUMECINDO, HOPKIN ([id number]) o Other: - SF to order and initiate lymphedema pumps Electronic Signature(s) Signed: 11/21/2015 3:29:37 PM By: Elpidio Eric BSN, RN Signed: 11/21/2015 3:55:58 PM By: Evlyn Kanner MD, FACS Entered By: Elpidio Eric on 11/21/2015 11:35:00 JUVENCIO, VERDI ([id number]) -------------------------------------------------------------------------------- Problem List Details Patient Name: BINYAMIN, NELIS. Date of Service: 11/21/2015 10:45 AM Medical Record Number: [id number] Patient Account Number: [id number] Date of Birth/Sex: [birth year] (80 y.o. 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 Date of Service: 11/21/2015 10:45 AM Medical Record Number: [id number] Patient Account Number: [id number] Date of Birth/Sex: 1936/03/29 (80 y.o. Male) Treating RN: Clover Mealy, RN, BSN, [place] Sink Primary Care Physician: Terance Hart, DAVID Other Clinician: Referring Physician: Dorothey Baseman Treating Physician/Extender: Rudene Re in Treatment: 46 Subjective Chief Complaint Information obtained from Patient Patient presents for treatment of an open ulcer due to venous insufficiency. The patient has had a open wound on his right medial ankle for at least 3 years and was last seen in the wound clinic in February 2015. He was lost to follow-up after that. History of Present Illness (HPI) The following HPI elements were documented for the patient's wound: Location: right lower extremity ulceration Quality: Patient reports experiencing a dull pain to affected area(s). Severity: Patient states wound are getting worse. 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 Modifying Factors: Consults to this date include:has received treatment in the wound center in the past  but stopped coming here since February 2015 Associated Signs and Symptoms: Patient reports having difficulty standing for long periods. 80 year old male who is known to have progressive weakness and has been in a nursing home for a while has had chronic lower extremity edema both legs and a large open wound on the right lower extremity which is has at least for about 3-4 years. The patient was seen in the wound clinic before and has been treated until February 2015 when he was lost to follow-up. Past medical history is significant for hypertension, gout, venous stasis ulcers, Parkinson's [name], constipation. He's not been a diabetic but his last hemoglobin A1c was 6 in March 2015. There is documentation that he's had endovenous ablation of bilateral varicose veins but we have no documentation about this and this may be done at least 4-5 years ago. 01/22/2015 -- he has his vascular test is scheduled for this afternoon. 01/29/2015 -- the patient's vascular test was canceled last week because he was unable to get onto the examining bed at [facility]. His Unna's boot was not applied either and the patient had a dressing placed by home health. Today when his dressing was removed we found a lot of maggots in his right lower extremity. 02/07/2015 -- Was seen in the vascular office by the PA and she noted that a bilateral venous duplex study did not show any DVT, SVT or reflux bilaterally. The patient was advised to continue with Unna's boot and would at some stage to require graduated compression stockings of the 20-30 mmHg variety. In the future lymphedema pumps would also be considered. 02/14/2015 -- his dressing is smelling quite a bit and there is a discoloration of the wound suggestive of an DAVEION, ROBAR. ([id number]) infection. Deep tissue cultures will be taken today. 02/21/2015 -- the  culture is back and it has growth moderate growth of Proteus and gram-negative rods sensitive to  sulfa, ampicillin, cephazolin, Zosyn. He is already on doxycycline and his wound is looking clean and hence we will continue with this. 02/28/2015 -- he's been doing fine still on his antibiotics and has no fresh issues. 03/07/2015 -- I was told that because he lives in a nursing home the Apligraf was denied by his insurance company. 03/14/2015 -- we are awaiting samples and a trial of Puraply to be tried for his ulceration. 03/21/2015 -- his Apligraf was approved and he is here for his first application of Apligraf 03/28/2015 -- his wound has had quite a bit of secretion and needs to be changed and hence I will use the second application of Apligraf. 04/04/2015 -- he is here for a wound check but has a lot of secretions and hence his dressing needs to be taken down. 04/09/2015 -- he is here for his third application of Apligraf 04/18/2015 -- he is here for a wound check and though he does not have any overt infection he always has a foul odor to his leg. 04/25/2015 -- his wound has been doing much better and he has minimal drainage and the size is smaller. Of note he is on doxycycline. 05/02/2015 -- he is here for his fourth application of Apligraf 05/09/2015 -- he is here for a wound check and is bothered by a lot of odor from the wound. 05/16/2015 -- he has done very well, the odor and drainage is less with the wound dressing having been changed twice this week. He is here for his last application of Apligraf. 06/20/2015 -- an area superior to the previous wound has now opened up and this is a fairly superficial ulceration. 07/12/2015 -- he is here for his first application of Puraply. This is a sample and this is no charge. 07/18/2015 -- he is here for his second application of Puraply. This is a sample and this is no charge. 07/25/2015 -- he is here for his third application of Puraply. This is a sample and this is no charge. 08/01/2015 -- he is here for his fourth application of  Puraply. This is a sample and this is no charge. 08/08/2015 -- he is here for his fifth application of Puraply and this is a sample at no charge. 08/15/2015 -- he is here for his sixth application of Puraply and this is a sample at no charge. 08/29/2015 -- he is here for his eight application of Puraply and this is a sample at no charge. 09/05/2015 -- he is here for his nineth application of Puraply and this is a sample at no charge. 09/19/2015 -- this is the second week of using the collagen substitute and he is doing very well. 09/26/2015 -- this is the third week of using the collagen substitute. 10/03/2015 -- this is the fourth week of using the collagen substitute. 10/24/2015 --this is the seventh week of using the collagen substitute. 11/07/2015 -- last week he had not used the collagen substitute but today we have applied the eighth collagen substitute on his wound. 11/14/2015 -- he continues to have 2 small areas which are open and we will use the ninth application of the collagen substitute 11/21/2015 -- may continue to use the collagen substitute as he has tiny area still open. We have been trying to work with his home health or his nursing home regarding getting this from the vendor [employer] [place], Chrissie Noa P. (  [id number]) Constitutional Pulse regular. Respirations normal and unlabored. Afebrile. Vitals Time Taken: 10:54 AM, Height: 68 in, Temperature: 98.4 F, Pulse: 67 bpm, Respiratory Rate: 18 breaths/min, Blood Pressure: 124/47 mmHg. Eyes Nonicteric. Reactive to light. Ears, Nose, Mouth, and Throat Lips, teeth, and gums WNL.Marland Kitchen Moist mucosa without lesions. Neck supple and nontender. No palpable supraclavicular or cervical adenopathy. Normal sized without goiter. Respiratory WNL. No retractions.. Cardiovascular Pedal Pulses WNL. No clubbing, cyanosis or edema. Lymphatic No adneopathy. No adenopathy. No adenopathy. Musculoskeletal Adexa without tenderness or  enlargement.. Digits and nails w/o clubbing, cyanosis, infection, petechiae, ischemia, or inflammatory conditions.Marland Kitchen Psychiatric Judgement and insight Intact.. No evidence of depression, anxiety, or agitation.. General Notes: the leg is looking quite skinny and the lymphedema has gone down significantly. There are couple of small open areas a few millimeters in diameter and will continue to use a collagen substitute. Integumentary (Hair, Skin) No suspicious lesions. No crepitus or fluctuance. No peri-wound warmth or erythema. No masses.. Wound #5 status is Open. Original cause of wound was Gradually Appeared. The wound is located on the Right,Medial Lower Leg. The wound measures 2.5cm length x 1cm width x 0.1cm depth; 1.963cm^2 area and 0.196cm^3 volume. Wound #7 status is Open. Original cause of wound was Blister. The wound is located on the Right,Proximal Lower Leg. The wound measures 0cm length x 0cm width x 0cm depth; 0cm^2 area and 0cm^3 volume. 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 Skin Barriers/Peri-Wound Care: Wound #5 Right,Medial Lower Leg: Moisturizing lotion Primary Wound Dressing: Wound #5 Right,Medial Lower Leg: Other: - collagen dressing applied by Dr Meyer Russel today in clinic Secondary Dressing: Wound #5 Right,Medial Lower Leg: ABD pad Mepitel One - over collagen Dressing Change Frequency: Wound #5 Right,Medial Lower Leg: Change dressing every week Follow-up Appointments: Wound #5 Right,Medial Lower Leg: Return Appointment in 1 week. Edema Control: Wound #5 Right,Medial  Lower Leg: 3 Layer Compression System - Right Lower Extremity Other: - SNF to provide patient with bilateral 20-11mmHg compression hose to be worn daily. Start left leg now and right leg once wound heals. This is to be worn EVERY day so multiple pairs should be provided. Other: - SF to order and initiate lymphedema pumps Wound #7 Right,Proximal Lower Leg: Other: - SF to order and initiate lymphedema pumps JACAI, KIPP. ([id number]) He has received his compression stockings, and I have confirmed that he has the right. His lymphedema is doing very well with a 4-layer Profore, but in view of the fact that he will be soon transitioning to the compression stockings I am going to use a 3 layer Profore today. We are having a bit of a problem trying to organize his lymphedema pumps. I have recommended that we get him lymphedema pumps as he has significant stage II lymphedema which has been treated appropriately with elevation and exercise and class I compression wrap, for several weeks. despite the conservative therapy he still persists in having stage II lymphedema and it is only controlled with significant compression. I'm afraid if we don't get him lymphedema pumps, once he is healed he is going to quickly revert back to an ulcerated state, if we don't maintain compression  and lymph flow appropriately. Electronic Signature(s) Signed: 11/21/2015 11:44:17 AM By: Evlyn Kanner MD, FACS Entered By: Evlyn Kanner on 11/21/2015 11:44:17 Candy, Ziegler Chrissie Noa ([id number]) -------------------------------------------------------------------------------- SuperBill Details Patient Name: TRACE, WIRICK. Date of Service: 11/21/2015 Medical Record Number: [id number] Patient Account Number: [id number] Date of Birth/Sex: Oct 06, 1935 (80 y.o. 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 E66.01 Morbid (severe) obesity due to excess calories I89.0 Lymphedema, not elsewhere classified Facility Procedures CPT4: Description Modifier Quantity Code [id number] (Facility Use Only) [phone] - APPLY MULTLAY COMPRS LWR RT 1 LEG Physician Procedures CPT4 Code Description: [id number] - WC PHYS LEVEL 3 - EST PT ICD-10 Description Diagnosis I87.311 Chronic venous hypertension (idiopathic) with ulcer of 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 E66.01 Morbid (severe) obesity due  to excess calories I89.0 Lymphedema, not elsewhere classified Modifier: right lower s of muscle Quantity: 1 extremity Electronic Signature(s) Signed: 11/21/2015 12:27:13 PM By: Elpidio Eric BSN, RN Signed: 11/21/2015 3:55:58 PM By: Evlyn Kanner MD, FACS Previous Signature: 11/21/2015 11:44:33 AM Version By: Evlyn Kanner MD, FACS Entered By: Elpidio Eric on 11/21/2015 12:27:13

## 2015-11-28 ENCOUNTER — Encounter: Payer: Medicare Other | Admitting: Surgery

## 2015-11-28 DIAGNOSIS — I87311 Chronic venous hypertension (idiopathic) with ulcer of right lower extremity: Secondary | ICD-10-CM | POA: Diagnosis not present

## 2015-11-28 NOTE — Progress Notes (Signed)
PEARSON, PICOU ([id number]) Visit Report for 11/28/2015 Arrival Information Details Patient Name: [name], [name] Date of Service: 11/28/2015 10:45 AM Medical Record Number: [id number] Patient Account Number: [id number] Date of Birth/Sex: 17-Nov-1935 (80 y.o. Male) Treating RN: Clover Mealy, RN, BSN, [place] Sink Primary Care Physician: Terance Hart, DAVID Other Clinician: Referring Physician: Terance Hart, DAVID Treating Physician/Extender: Rudene Re in Treatment: 45 Visit Information History Since Last Visit Added or deleted any medications: No Patient Arrived: Wheel Chair Any new allergies or adverse reactions: No Arrival Time: 10:44 Had a fall or experienced change in No activities of daily living that may affect Accompanied By: self risk of falls: Transfer Assistance: None Signs or symptoms of abuse/neglect since last No Patient Identification Verified: Yes visito Secondary Verification Process Yes Hospitalized since last visit: No Completed: Has Dressing in Place as Prescribed: Yes Patient Requires Transmission-Based No Has Compression in Place as Prescribed: Yes Precautions: Pain Present Now: No Patient Has Alerts: No Electronic Signature(s) Signed: 11/28/2015 2:43:02 PM By: Elpidio Eric BSN, RN Entered By: Elpidio Eric on 11/28/2015 10:45:02 [name] ([id number]) -------------------------------------------------------------------------------- Encounter Discharge Information Details Patient Name: [name] Date of Service: 11/28/2015 10:45 AM Medical Record Number: [id number] Patient Account Number: [id number] Date of Birth/Sex: Jun 15, 1936 (80 y.o. Male) Treating RN: Clover Mealy, RN, BSN, [place] Sink Primary Care Physician: Terance Hart, DAVID Other Clinician: Referring Physician: Terance Hart, DAVID Treating Physician/Extender: Rudene Re in Treatment: 74 Encounter Discharge Information Items Discharge Pain Level: 0 Discharge Condition: Stable Ambulatory Status:  Wheelchair Discharge Destination: Nursing Home Transportation: Other Accompanied By: self Schedule Follow-up Appointment: No Medication Reconciliation completed and provided to Patient/Care No Roniqua Kintz: Provided on Clinical Summary of Care: 11/28/2015 Form Type Recipient Paper Patient [facility] Electronic Signature(s) Signed: 11/28/2015 11:29:54 AM By: Elpidio Eric BSN, RN Previous Signature: 11/28/2015 11:12:51 AM Version By: Gwenlyn Perking Entered By: Elpidio Eric on 11/28/2015 11:29:54 [name] ([id number]) -------------------------------------------------------------------------------- Lower Extremity Assessment Details Patient Name: [name] Date of Service: 11/28/2015 10:45 AM Medical Record Number: [id number] Patient Account Number: [id number] Date of Birth/Sex: Dec 31, 1935 (80 y.o. Male) Treating RN: Clover Mealy, RN, BSN, [place] Sink Primary Care Physician: Terance Hart, DAVID Other Clinician: Referring Physician: Terance Hart, DAVID Treating Physician/Extender: Rudene Re in Treatment: 45 Edema Assessment Assessed: [Left: No] [Right: No] E[Left: dema] [Right: :] Calf Left: Right: Point of Measurement: 40 cm From Medial Instep cm 36 cm Ankle Left: Right: Point of Measurement: 12 cm From Medial Instep cm 25.4 cm Vascular Assessment Claudication: Claudication Assessment [Right:None] Pulses: Posterior Tibial Dorsalis Pedis Palpable: [Right:Yes] Extremity colors, hair growth, and conditions: Extremity Color: [Right:Hyperpigmented] Hair Growth on Extremity: [Right:No] Capillary Refill: [Right:< 3 seconds] Toe Nail Assessment Left: Right: Thick: Yes Discolored: Yes Deformed: No Improper Length and Hygiene: Yes Electronic Signature(s) Signed: 11/28/2015 2:43:02 PM By: Elpidio Eric BSN, RN Entered By: Elpidio Eric on 11/28/2015 10:46:35 [name], [name] ([id number]) [name], [name]  ([id number]) -------------------------------------------------------------------------------- Multi Wound Chart Details Patient Name: [name] Date of Service: 11/28/2015 10:45 AM Medical Record Number: [id number] Patient Account Number: [id number] Date of Birth/Sex: 01-17-1936 (80 y.o. Male) Treating RN: Clover Mealy, RN, BSN, [place] Sink Primary Care Physician: Terance Hart, DAVID Other Clinician: Referring Physician: Terance Hart, DAVID Treating Physician/Extender: Rudene Re in Treatment: 45 Vital Signs Height(in): 68 Pulse(bpm): 65 Weight(lbs): Blood Pressure 135/52 (mmHg): Body Mass Index(BMI): Temperature(F): 97.7 Respiratory Rate 18 (breaths/min): Photos: [5:No Photos] [7:No Photos] [N/A:N/A] Wound Location: [5:Right, Medial Lower Leg] [7:Right, Proximal Lower Leg N/A] Wounding Event: [5:Gradually Appeared] [7:Blister] [N/A:N/A] Primary Etiology: [5:Venous Leg Ulcer] [7:Venous Leg Ulcer] [N/A:N/A] Date  Acquired: [5:11/13/2014] [7:11/11/2015] [N/A:N/A] Weeks of Treatment: [5:45] [7:2] [N/A:N/A] Wound Status: [5:Open] [7:Open] [N/A:N/A] Measurements L x W x D 1x1x0.1 [7:2x2x0.1] [N/A:N/A] (cm) Area (cm) : [5:0.785] [7:3.142] [N/A:N/A] Volume (cm) : [5:0.079] [7:0.314] [N/A:N/A] % Reduction in Area: [5:99.20%] [7:-1503.10%] [N/A:N/A] % Reduction in Volume: 99.70% [7:-1470.00%] [N/A:N/A] Classification: [5:Full Thickness Without Exposed Support Structures] [7:Partial Thickness] [N/A:N/A] Periwound Skin Texture: No Abnormalities Noted [7:No Abnormalities Noted] [N/A:N/A] Periwound Skin [5:No Abnormalities Noted] [7:No Abnormalities Noted] [N/A:N/A] Moisture: Periwound Skin Color: No Abnormalities Noted [7:No Abnormalities Noted] [N/A:N/A] Tenderness on [5:No] [7:No] [N/A:N/A] Treatment Notes Electronic Signature(s) Signed: 11/28/2015 2:43:02 PM By: Elpidio Eric BSN, RN Entered By: Elpidio Eric on 11/28/2015 11:01:14 [name], [name] ([id number]) [name], [name]  ([id number]) -------------------------------------------------------------------------------- Multi-Disciplinary Care Plan Details Patient Name: [name], [name] Date of Service: 11/28/2015 10:45 AM Medical Record Number: [id number] Patient Account Number: [id number] Date of Birth/Sex: 1935-11-15 (80 y.o. Male) Treating RN: Clover Mealy, RN, BSN, [place] Sink Primary Care Physician: Terance Hart, DAVID Other Clinician: Referring Physician: Terance Hart, DAVID Treating Physician/Extender: Rudene Re in Treatment: 39 Active Inactive Orientation to the Wound Care Program Nursing Diagnoses: Knowledge deficit related to the wound healing center program Goals: Patient/caregiver will verbalize understanding of the Wound Healing Center Program Date Initiated: 01/15/2015 Goal Status: Active Interventions: Provide education on orientation to the wound center Notes: Venous Leg Ulcer Nursing Diagnoses: Knowledge deficit related to disease process and management Potential for venous Insuffiency (use before diagnosis confirmed) Goals: Patient will maintain optimal edema control Date Initiated: 01/15/2015 Goal Status: Active Patient/caregiver will verbalize understanding of disease process and disease management Date Initiated: 01/15/2015 Goal Status: Active Verify adequate tissue perfusion prior to therapeutic compression application Date Initiated: 01/15/2015 Goal Status: Active Interventions: Assess peripheral edema status every visit. Compression as ordered Provide education on venous insufficiency [name], [name] ([id number]) Treatment Activities: Test ordered outside of clinic : 10/24/2015 Therapeutic compression applied : 10/24/2015 Venous Duplex Doppler : 10/24/2015 Notes: Wound/Skin Impairment Nursing Diagnoses: Impaired tissue integrity Knowledge deficit related to ulceration/compromised skin integrity Goals: Patient/caregiver will verbalize understanding of skin care regimen Date  Initiated: 01/15/2015 Goal Status: Active Ulcer/skin breakdown will have a volume reduction of 30% by week 4 Date Initiated: 01/15/2015 Goal Status: Active Ulcer/skin breakdown will have a volume reduction of 50% by week 8 Date Initiated: 01/15/2015 Goal Status: Active Ulcer/skin breakdown will have a volume reduction of 80% by week 12 Date Initiated: 01/15/2015 Goal Status: Active Ulcer/skin breakdown will heal within 14 weeks Date Initiated: 01/15/2015 Goal Status: Active Interventions: Assess patient/caregiver ability to perform ulcer/skin care regimen upon admission and as needed Assess ulceration(s) every visit Provide education on ulcer and skin care Notes: Electronic Signature(s) Signed: 11/28/2015 2:43:02 PM By: Elpidio Eric BSN, RN Entered By: Elpidio Eric on 11/28/2015 11:01:04 [name], [name] ([id number]) -------------------------------------------------------------------------------- Pain Assessment Details Patient Name: [name] Date of Service: 11/28/2015 10:45 AM Medical Record Number: [id number] Patient Account Number: [id number] Date of Birth/Sex: 14-Sep-1935 (80 y.o. Male) Treating RN: Clover Mealy, RN, BSN, [place] Sink Primary Care Physician: Terance Hart, DAVID Other Clinician: Referring Physician: Dorothey Baseman Treating Physician/Extender: Rudene Re in Treatment: 45 Active Problems Location of Pain Severity and Description of Pain Patient Has Paino No Site Locations Pain Management and Medication Current Pain Management: Electronic Signature(s) Signed: 11/28/2015 2:43:02 PM By: Elpidio Eric BSN, RN Entered By: Elpidio Eric on 11/28/2015 10:45:12 [name] ([id number]) -------------------------------------------------------------------------------- Patient/Caregiver Education Details Patient Name: [name] Date of Service: 11/28/2015 10:45 AM Medical Record Number: [id number] Patient Account Number: [id number] Date  of Birth/Gender:  [birth year] (80 y.o. Male) Treating RN: Clover Mealy, RN, BSN, [place] Sink Primary Care Physician: Terance Hart, DAVID Other Clinician: Referring Physician: Terance Hart DAVID Treating Physician/Extender: Rudene Re in Treatment: 19 Education Assessment Education Provided To: Patient Education Topics Provided Venous: Methods: Explain/Verbal Responses: State content correctly Welcome To The Wound Care Center: Methods: Explain/Verbal Responses: State content correctly Wound/Skin Impairment: Methods: Explain/Verbal Responses: State content correctly Electronic Signature(s) Signed: 11/28/2015 11:30:21 AM By: Elpidio Eric BSN, RN Entered By: Elpidio Eric on 11/28/2015 11:30:21 [name], [name] ([id number]) -------------------------------------------------------------------------------- Wound Assessment Details Patient Name: [name] Date of Service: 11/28/2015 10:45 AM Medical Record Number: [id number] Patient Account Number: [id number] Date of Birth/Sex: 1936-06-06 (80 y.o. Male) Treating RN: Clover Mealy, RN, BSN, [place] Primary Care Physician: Terance Hart, DAVID Other Clinician: Referring Physician: Terance Hart, DAVID Treating Physician/Extender: Rudene Re in Treatment: 45 Wound Status Wound Number: 5 Primary Etiology: Venous Leg Ulcer Wound Location: Right, Medial Lower Leg Wound Status: Open Wounding Event: Gradually Appeared Date Acquired: 11/13/2014 Weeks Of Treatment: 45 Clustered Wound: No Photos Photo Uploaded By: Elpidio Eric on 11/28/2015 13:28:08 Wound Measurements Length: (cm) 1 Width: (cm) 1 Depth: (cm) 0.1 Area: (cm) 0.785 Volume: (cm) 0.079 % Reduction in Area: 99.2% % Reduction in Volume: 99.7% Wound Description Full Thickness Without Exposed Classification: Support Structures Periwound Skin Texture Texture Color No Abnormalities Noted: No No Abnormalities Noted: No Moisture No Abnormalities Noted: No Treatment Notes Wound #5 (Right, Medial Lower  Leg) [name], [name] ([id number]) 1. Cleansed with: Cleanse wound with antibacterial soap and water 3. Peri-wound Care: Barrier cream Moisturizing lotion 4. Dressing Applied: Mepitel 5. Secondary Dressing Applied ABD Pad 7. Secured with 4-Layer Compression System - Right Lower Extremity Electronic Signature(s) Signed: 11/28/2015 2:43:02 PM By: Elpidio Eric BSN, RN Entered By: Elpidio Eric on 11/28/2015 10:54:51 [name], [name] ([id number]) -------------------------------------------------------------------------------- Wound Assessment Details Patient Name: [name], CHOYCE. Date of Service: 11/28/2015 10:45 AM Medical Record Number: [id number] Patient Account Number: [id number] Date of Birth/Sex: Oct 15, 1935 (80 y.o. Male) Treating RN: Clover Mealy, RN, BSN, [place] Primary Care Physician: Terance Hart, DAVID Other Clinician: Referring Physician: Terance Hart, DAVID Treating Physician/Extender: Rudene Re in Treatment: 45 Wound Status Wound Number: 7 Primary Etiology: Venous Leg Ulcer Wound Location: Right, Proximal Lower Leg Wound Status: Open Wounding Event: Blister Date Acquired: 11/11/2015 Weeks Of Treatment: 2 Clustered Wound: No Photos Photo Uploaded By: Elpidio Eric on 11/28/2015 13:28:08 Wound Measurements Length: (cm) 2 Width: (cm) 2 Depth: (cm) 0.1 Area: (cm) 3.142 Volume: (cm) 0.314 % Reduction in Area: -1503.1% % Reduction in Volume: -1470% Wound Description Classification: Partial Thickness Periwound Skin Texture Texture Color No Abnormalities Noted: No No Abnormalities Noted: No Moisture No Abnormalities Noted: No Treatment Notes Wound #7 (Right, Proximal Lower Leg) 1. Cleansed with: RICHRD, KUZNIAR ([id number]) Cleanse wound with antibacterial soap and water 3. Peri-wound Care: Barrier cream Moisturizing lotion 4. Dressing Applied: Mepitel 5. Secondary Dressing Applied ABD Pad 7. Secured with 4-Layer Compression System - Right Lower  Extremity Electronic Signature(s) Signed: 11/28/2015 2:43:02 PM By: Elpidio Eric BSN, RN Entered By: Elpidio Eric on 11/28/2015 10:54:51 DAIMEN, SHOVLIN ([id number]) -------------------------------------------------------------------------------- Vitals Details Patient Name: [name] Date of Service: 11/28/2015 10:45 AM Medical Record Number: [id number] Patient Account Number: [id number] Date of Birth/Sex: 06-27-1936 (80 y.o. Male) Treating RN: Clover Mealy, RN, BSN, [place] Sink Primary Care Physician: Terance Hart, DAVID Other Clinician: Referring Physician: Terance Hart, DAVID Treating Physician/Extender: Rudene Re in Treatment: 45 Vital Signs Time Taken: 10:47 Temperature (F): 97.7 Height (in): 68 Pulse (bpm): 65 Respiratory  Rate (breaths/min): 18 Blood Pressure (mmHg): 135/52 Reference Range: 80 - 120 mg / dl Electronic Signature(s) Signed: 11/28/2015 2:43:02 PM By: Elpidio Eric BSN, RN Entered By: Elpidio Eric on 11/28/2015 10:47:08

## 2015-11-28 NOTE — Progress Notes (Signed)
[name] [name] ([id number]) Visit Report for 11/28/2015 Chief Complaint Document Details Patient Name: [name] [name]. Date of Service: 11/28/2015 10:45 AM Medical Record Number: [id number] Patient Account Number: [id number] Date of Birth/Sex: [birth year] (80 y.o. Male) Treating RN: Clover Mealy, RN, BSN, [place] Sink Primary Care Physician: Terance Hart, DAVID Other Clinician: Referring Physician: Dorothey Baseman Treating Physician/Extender: Rudene Re in Treatment: 45 Information Obtained from: Patient Chief Complaint Patient presents for treatment of an open ulcer due to venous insufficiency. The patient has had a open wound on his right medial ankle for at least 3 years and was last seen in the wound clinic in February 2015. He was lost to follow-up after that. Electronic Signature(s) Signed: 11/28/2015 11:02:52 AM By: Evlyn Kanner MD, FACS Entered By: Evlyn Kanner on 11/28/2015 11:02:51 [name] [name] ([id number]) -------------------------------------------------------------------------------- HPI Details Patient Name: [name], [name]. Date of Service: 11/28/2015 10:45 AM Medical Record Number: [id number] Patient Account Number: [id number] Date of Birth/Sex: 1935-10-31 (80 y.o. Male) Treating RN: Clover Mealy, RN, BSN, [place] Sink Primary Care Physician: Terance Hart, DAVID Other Clinician: Referring Physician: Terance Hart, DAVID Treating Physician/Extender: Rudene Re in Treatment: 45 History of Present Illness Location: right lower extremity ulceration Quality: Patient reports experiencing a dull pain to affected area(s). Severity: Patient states wound are getting worse. 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 Modifying Factors: Consults to this date include:has received treatment in the wound center in the past but stopped coming here since February 2015 Associated Signs and Symptoms: Patient reports having difficulty standing for long periods. HPI Description: 80 year old male who is known to have progressive weakness and has been in a nursing home for a while has had chronic lower extremity edema both legs and a large open wound on the right lower extremity which is has at least for about 3-4 years. The patient was seen in the wound clinic before and has been treated until February 2015 when he was lost to follow-up. Past medical history is significant for hypertension, gout, venous stasis ulcers, Parkinson's [name], constipation. He's not been a diabetic but his last hemoglobin A1c was 6 in March 2015. There is documentation that he's had endovenous ablation of bilateral varicose veins but we have no documentation about this and this may be done at least 4-5 years ago. 01/22/2015 -- he has his vascular test is scheduled for this afternoon. 01/29/2015 -- the patient's vascular test was canceled last week because he was unable to get onto the examining bed at [facility]. His Unna's boot was not applied either and the patient had a dressing placed by home health. Today when his dressing was removed we found a lot of maggots in his right lower extremity. 02/07/2015 -- Was seen in the vascular office by the PA and she noted that a bilateral venous duplex study did not show any DVT, SVT or reflux bilaterally. The patient was advised to continue with Unna's boot and would at some stage to require graduated compression stockings of the 20-30 mmHg variety. In the future lymphedema pumps would also be considered. 02/14/2015 -- his dressing is smelling quite a bit and there is a discoloration of the wound suggestive of an infection. Deep tissue cultures will  be taken today. 02/21/2015 --  the culture is back and it has growth moderate growth of Proteus and gram-negative rods sensitive to sulfa, ampicillin, cephazolin, Zosyn. He is already on doxycycline and his wound is looking clean and hence we will continue with this. 02/28/2015 -- he's been doing fine still on his antibiotics and has no fresh issues. 03/07/2015 -- I was told that because he lives in a nursing home the Apligraf was denied by his insurance company. 03/14/2015 -- we are awaiting samples and a trial of Puraply to be tried for his ulceration. 03/21/2015 -- his Apligraf was approved and he is here for his first application of Apligraf 03/28/2015 -- his wound has had quite a bit of secretion and needs to be changed and hence I will use the VANDELL, GOULET. ([id number]) second application of Apligraf. 04/04/2015 -- he is here for a wound check but has a lot of secretions and hence his dressing needs to be taken down. 04/09/2015 -- he is here for his third application of Apligraf 04/18/2015 -- he is here for a wound check and though he does not have any overt infection he always has a foul odor to his leg. 04/25/2015 -- his wound has been doing much better and he has minimal drainage and the size is smaller. Of note he is on doxycycline. 05/02/2015 -- he is here for his fourth application of Apligraf 05/09/2015 -- he is here for a wound check and is bothered by a lot of odor from the wound. 05/16/2015 -- he has done very well, the odor and drainage is less with the wound dressing having been changed twice this week. He is here for his last application of Apligraf. 06/20/2015 -- an area superior to the previous wound has now opened up and this is a fairly superficial ulceration. 07/12/2015 -- he is here for his first application of Puraply. This is a sample and this is no charge. 07/18/2015 -- he is here for his second application of Puraply. This is a sample and this is no charge. 07/25/2015 -- he is here for  his third application of Puraply. This is a sample and this is no charge. 08/01/2015 -- he is here for his fourth application of Puraply. This is a sample and this is no charge. 08/08/2015 -- he is here for his fifth application of Puraply and this is a sample at no charge. 08/15/2015 -- he is here for his sixth application of Puraply and this is a sample at no charge. 08/29/2015 -- he is here for his eight application of Puraply and this is a sample at no charge. 09/05/2015 -- he is here for his nineth application of Puraply and this is a sample at no charge. 09/19/2015 -- this is the second week of using the collagen substitute and he is doing very well. 09/26/2015 -- this is the third week of using the collagen substitute. 10/03/2015 -- this is the fourth week of using the collagen substitute. 10/24/2015 --this is the seventh week of using the collagen substitute. 11/07/2015 -- last week he had not used the collagen substitute but today we have applied the eighth collagen substitute on his wound. 11/14/2015 -- he continues to have 2 small areas which are open and we will use the ninth application of the collagen substitute 11/21/2015 -- may continue to use the collagen substitute as he has tiny area still open. We have been trying to work with his home health or his nursing home  regarding getting this from the vendor Electronic Signature(s) Signed: 11/28/2015 11:02:57 AM By: Evlyn Kanner MD, FACS Entered By: Evlyn Kanner on 11/28/2015 11:02:57 [name], [name] ([id number]) -------------------------------------------------------------------------------- Physical Exam Details Patient Name: [name], [name]. Date of Service: 11/28/2015 10:45 AM Medical Record Number: [id number] Patient Account Number: [id number] Date of Birth/Sex: 10-Nov-1935 (80 y.o. Male) Treating RN: Clover Mealy, RN, BSN, [place] Sink Primary Care Physician: Terance Hart, DAVID Other Clinician: Referring Physician: Terance Hart,  DAVID Treating Physician/Extender: Rudene Re in Treatment: 45 Constitutional . Pulse regular. Respirations normal and unlabored. Afebrile. . Eyes Nonicteric. Reactive to light. Ears, Nose, Mouth, and Throat Lips, teeth, and gums WNL.Marland Kitchen Moist mucosa without lesions. Neck supple and nontender. No palpable supraclavicular or cervical adenopathy. Normal sized without goiter. Respiratory WNL. No retractions.. Cardiovascular Pedal Pulses WNL. No clubbing, cyanosis or edema. Lymphatic No adneopathy. No adenopathy. No adenopathy. Musculoskeletal Adexa without tenderness or enlargement.. Digits and nails w/o clubbing, cyanosis, infection, petechiae, ischemia, or inflammatory conditions.. Integumentary (Hair, Skin) No suspicious lesions. No crepitus or fluctuance. No peri-wound warmth or erythema. No masses.Marland Kitchen Psychiatric Judgement and insight Intact.. No evidence of depression, anxiety, or agitation.. Notes his right lower extremity has minimal edema and the wound is completely healed with healthy epithelialization and he has a few abrasions on this right leg laterally. Overall this is excellent improvement Electronic Signature(s) Signed: 11/28/2015 11:04:13 AM By: Evlyn Kanner MD, FACS Entered By: Evlyn Kanner on 11/28/2015 11:04:13 CHISUM, HABENICHT ([id number]) -------------------------------------------------------------------------------- Physician Orders Details Patient Name: LAYTH, CEREZO. Date of Service: 11/28/2015 10:45 AM Medical Record Number: [id number] Patient Account Number: [id number] Date of Birth/Sex: [birth year] (80 y.o. Male) Treating RN: Clover Mealy, RN, BSN, [place] Sink Primary Care Physician: Terance Hart, DAVID Other Clinician: Referring Physician: Terance Hart DAVID Treating Physician/Extender: Rudene Re in Treatment: 64 Verbal / Phone Orders: Yes Clinician: Afful, RN, BSN, Rita Read Back and Verified: Yes Diagnosis Coding Wound Cleansing Wound #5  Right,Medial Lower Leg o Cleanse wound with mild soap and water o May shower with protection. o No tub bath. Wound #7 Right,Proximal Lower Leg o Cleanse wound with mild soap and water o May shower with protection. o No tub bath. Skin Barriers/Peri-Wound Care Wound #5 Right,Medial Lower Leg o Barrier cream o Moisturizing lotion Wound #7 Right,Proximal Lower Leg o Barrier cream o Moisturizing lotion Primary Wound Dressing Wound #5 Right,Medial Lower Leg o Mepitel One Wound #7 Right,Proximal Lower Leg o Mepitel One Secondary Dressing Wound #5 Right,Medial Lower Leg o ABD pad Wound #7 Right,Proximal Lower Leg o ABD pad Dressing Change Frequency Wound #5 Right,Medial Lower Leg [name], [name] ([id number]) o Change dressing every week Wound #7 Right,Proximal Lower Leg o Change dressing every week Follow-up Appointments Wound #5 Right,Medial Lower Leg o Return Appointment in 1 week. Wound #7 Right,Proximal Lower Leg o Return Appointment in 1 week. Edema Control Wound #5 Right,Medial Lower Leg o 4-Layer Compression System - Right Lower Extremity o Elevate legs to the level of the heart and pump ankles as often as possible Wound #7 Right,Proximal Lower Leg o 4-Layer Compression System - Right Lower Extremity o Elevate legs to the level of the heart and pump ankles as often as possible Electronic Signature(s) Signed: 11/28/2015 2:43:02 PM By: Elpidio Eric BSN, RN Signed: 11/28/2015 4:02:17 PM By: Evlyn Kanner MD, FACS Entered By: Elpidio Eric on 11/28/2015 11:02:35 [name], [name] ([id number]) -------------------------------------------------------------------------------- Problem List Details Patient Name: TY, BUNTROCK. Date of Service: 11/28/2015 10:45 AM Medical Record Number: [id number] Patient Account Number: [id number] Date of Birth/Sex: [birth year] (79  y.o. 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 Date of Service: 11/28/2015 10:45 AM Medical Record Number: [id number] Patient Account Number: [id number] Date of Birth/Sex: 1935/08/19 (80 y.o. Male) Treating RN: Clover Mealy, RN, BSN, [place] Sink Primary Care Physician: Terance Hart, DAVID Other Clinician: Referring Physician: Dorothey Baseman Treating Physician/Extender: Rudene Re in Treatment: 45 Subjective Chief Complaint Information obtained from Patient Patient presents for treatment of an open ulcer due to venous insufficiency. The patient has had a open wound on his right medial ankle for at least 3 years and was last seen in the wound clinic in February 2015. He was lost to follow-up after that. History of Present Illness (HPI) The following HPI elements were documented for the patient's wound: Location: right lower extremity ulceration Quality: Patient reports experiencing a dull pain to affected area(s). Severity: Patient states wound are getting worse. 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 Modifying Factors: Consults to this date include:has received treatment in the wound center in the past but stopped coming here since February 2015 Associated Signs and Symptoms: Patient reports having difficulty standing for long periods. 80 year old male who is known to have progressive weakness and has been in a nursing home for a while has had chronic lower extremity edema both legs and a large open wound on the right lower extremity which is has at least for about 3-4 years. The patient was seen in the wound clinic before and has been treated until February 2015 when he was lost to follow-up. Past medical history is significant for hypertension, gout, venous stasis ulcers, Parkinson's [name], constipation. He's not been a diabetic but his last hemoglobin A1c was 6 in March 2015. There is documentation that he's had endovenous ablation of bilateral varicose veins but we have no documentation about this and this may be done at least 4-5 years ago. 01/22/2015 -- he has his vascular test is scheduled for this afternoon. 01/29/2015 -- the patient's vascular test was canceled last week because he was unable to get onto the examining bed at [facility]. His Unna's boot was not applied either and the patient had a dressing placed by home health. Today when his dressing was removed we found a lot of maggots in his right lower extremity. 02/07/2015 -- Was seen in the vascular office by the PA and she noted that a bilateral venous duplex study did not show any DVT, SVT or reflux bilaterally. The patient was advised to continue with Unna's boot and would at some stage to require graduated compression stockings of the 20-30 mmHg variety. In the  future lymphedema pumps would also be  considered. 02/14/2015 -- his dressing is smelling quite a bit and there is a discoloration of the wound suggestive of an [name], [name]. ([id number]) infection. Deep tissue cultures will be taken today. 02/21/2015 -- the culture is back and it has growth moderate growth of Proteus and gram-negative rods sensitive to sulfa, ampicillin, cephazolin, Zosyn. He is already on doxycycline and his wound is looking clean and hence we will continue with this. 02/28/2015 -- he's been doing fine still on his antibiotics and has no fresh issues. 03/07/2015 -- I was told that because he lives in a nursing home the Apligraf was denied by his insurance company. 03/14/2015 -- we are awaiting samples and a trial of Puraply to be tried for his ulceration. 03/21/2015 -- his Apligraf was approved and he is here for his first application of Apligraf 03/28/2015 -- his wound has had quite a bit of secretion and needs to be changed and hence I will use the second application of Apligraf. 04/04/2015 -- he is here for a wound check but has a lot of secretions and hence his dressing needs to be taken down. 04/09/2015 -- he is here for his third application of Apligraf 04/18/2015 -- he is here for a wound check and though he does not have any overt infection he always has a foul odor to his leg. 04/25/2015 -- his wound has been doing much better and he has minimal drainage and the size is smaller. Of note he is on doxycycline. 05/02/2015 -- he is here for his fourth application of Apligraf 05/09/2015 -- he is here for a wound check and is bothered by a lot of odor from the wound. 05/16/2015 -- he has done very well, the odor and drainage is less with the wound dressing having been changed twice this week. He is here for his last application of Apligraf. 06/20/2015 -- an area superior to the previous wound has now opened up and this is a fairly superficial ulceration. 07/12/2015 -- he is here for his first  application of Puraply. This is a sample and this is no charge. 07/18/2015 -- he is here for his second application of Puraply. This is a sample and this is no charge. 07/25/2015 -- he is here for his third application of Puraply. This is a sample and this is no charge. 08/01/2015 -- he is here for his fourth application of Puraply. This is a sample and this is no charge. 08/08/2015 -- he is here for his fifth application of Puraply and this is a sample at no charge. 08/15/2015 -- he is here for his sixth application of Puraply and this is a sample at no charge. 08/29/2015 -- he is here for his eight application of Puraply and this is a sample at no charge. 09/05/2015 -- he is here for his nineth application of Puraply and this is a sample at no charge. 09/19/2015 -- this is the second week of using the collagen substitute and he is doing very well. 09/26/2015 -- this is the third week of using the collagen substitute. 10/03/2015 -- this is the fourth week of using the collagen substitute. 10/24/2015 --this is the seventh week of using the collagen substitute. 11/07/2015 -- last week he had not used the collagen substitute but today we have applied the eighth collagen substitute on his wound. 11/14/2015 -- he continues to have 2 small areas which are open and we will use the ninth application of the  collagen substitute 11/21/2015 -- may continue to use the collagen substitute as he has tiny area still open. We have been trying to work with his home health or his nursing home regarding getting this from the vendor [employer] JOSHUAJAMES, MOEHRING. ([id number]) Constitutional Pulse regular. Respirations normal and unlabored. Afebrile. Vitals Time Taken: 10:47 AM, Height: 68 in, Temperature: 97.7 F, Pulse: 65 bpm, Respiratory Rate: 18 breaths/min, Blood Pressure: 135/52 mmHg. Eyes Nonicteric. Reactive to light. Ears, Nose, Mouth, and Throat Lips, teeth, and gums WNL.Marland Kitchen Moist mucosa without  lesions. Neck supple and nontender. No palpable supraclavicular or cervical adenopathy. Normal sized without goiter. Respiratory WNL. No retractions.. Cardiovascular Pedal Pulses WNL. No clubbing, cyanosis or edema. Lymphatic No adneopathy. No adenopathy. No adenopathy. Musculoskeletal Adexa without tenderness or enlargement.. Digits and nails w/o clubbing, cyanosis, infection, petechiae, ischemia, or inflammatory conditions.Marland Kitchen Psychiatric Judgement and insight Intact.. No evidence of depression, anxiety, or agitation.. General Notes: his right lower extremity has minimal edema and the wound is completely healed with healthy epithelialization and he has a few abrasions on this right leg laterally. Overall this is excellent improvement Integumentary (Hair, Skin) No suspicious lesions. No crepitus or fluctuance. No peri-wound warmth or erythema. No masses.. Wound #5 status is Open. Original cause of wound was Gradually Appeared. The wound is located on the Right,Medial Lower Leg. The wound measures 1cm length x 1cm width x 0.1cm depth; 0.785cm^2 area and 0.079cm^3 volume. Wound #7 status is Open. Original cause of wound was Blister. The wound is located on the Right,Proximal Lower Leg. The wound measures 2cm length x 2cm width x 0.1cm depth; 3.142cm^2 area and 0.314cm^3 volume. 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 He has not heard back from the lymphedema pump windows and I have asked his nursing home to call and talk to our nurses here. I was asked him to bring his compression  stockings the next week when he is here Plan Wound Cleansing: Wound #5 Right,Medial Lower Leg: Cleanse wound with mild soap and water May shower with protection. No tub bath. Wound #7 Right,Proximal Lower Leg: Cleanse wound with mild soap and water May shower with protection. No tub bath. Skin Barriers/Peri-Wound Care: Wound #5 Right,Medial Lower Leg: Barrier cream Moisturizing lotion Wound #7 Right,Proximal Lower Leg: Barrier cream Moisturizing lotion Primary Wound Dressing: Wound #5 Right,Medial Lower Leg: Mepitel One Wound #7 Right,Proximal Lower Leg: Mepitel One Secondary Dressing: Wound #5 Right,Medial Lower Leg: ABD pad MEIKO, STRANAHAN ([id number]) Wound #7 Right,Proximal Lower Leg: ABD pad Dressing Change Frequency: Wound #5 Right,Medial Lower Leg: Change dressing every week Wound #7 Right,Proximal Lower Leg: Change dressing every week Follow-up Appointments: Wound #5 Right,Medial Lower Leg: Return Appointment in 1 week. Wound #7 Right,Proximal Lower Leg: Return Appointment in 1 week. Edema Control: Wound #5 Right,Medial Lower Leg: 4-Layer Compression System - Right Lower Extremity Elevate legs to the level of the heart and pump ankles as often as possible Wound #7 Right,Proximal Lower Leg: 4-Layer Compression System - Right Lower Extremity Elevate legs to the level of the heart and pump ankles as often as possible I have recommended some Mepitel over his wound and we  will apply some ABD and then compress him with a 4-layer Profore. He has not heard back from the lymphedema pump windows and I have asked his nursing home to call and talk to our nurses here. I was asked him to bring his compression stockings the next week when he is here Electronic Signature(s) Signed: 11/28/2015 11:05:18 AM By: Evlyn Kanner MD, FACS Entered By: Evlyn Kanner on 11/28/2015 11:05:17 KALON, ERHARDT  ([id number]) -------------------------------------------------------------------------------- SuperBill Details Patient Name: [name]. Date of Service: 11/28/2015 Medical Record Number: [id number] Patient Account Number: [id number] Date of Birth/Sex: Dec 11, 1935 (80 y.o. 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 E66.01 Morbid (severe) obesity due to excess calories I89.0 Lymphedema, not elsewhere classified Facility Procedures CPT4: Description Modifier Quantity Code [id number] (Facility Use Only) [phone] - APPLY MULTLAY COMPRS LWR RT 1 LEG Physician Procedures CPT4 Code Description: [id number] - WC PHYS LEVEL 3 - EST PT ICD-10 Description Diagnosis I87.311 Chronic venous hypertension (idiopathic) with ulcer of 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

## 2015-12-05 ENCOUNTER — Encounter: Payer: Medicare Other | Attending: Surgery | Admitting: Surgery

## 2015-12-05 DIAGNOSIS — I890 Lymphedema, not elsewhere classified: Secondary | ICD-10-CM | POA: Diagnosis not present

## 2015-12-05 DIAGNOSIS — I87311 Chronic venous hypertension (idiopathic) with ulcer of right lower extremity: Secondary | ICD-10-CM | POA: Insufficient documentation

## 2015-12-05 DIAGNOSIS — I10 Essential (primary) hypertension: Secondary | ICD-10-CM | POA: Insufficient documentation

## 2015-12-05 DIAGNOSIS — L97313 Non-pressure chronic ulcer of right ankle with necrosis of muscle: Secondary | ICD-10-CM | POA: Diagnosis not present

## 2015-12-05 DIAGNOSIS — G20 Parkinson's disease: Secondary | ICD-10-CM | POA: Diagnosis not present

## 2015-12-05 NOTE — Progress Notes (Addendum)
HANCEL, ION ([id number]) Visit Report for 12/05/2015 Chief Complaint Document Details Patient Name: [name], [name] Date of Service: 12/05/2015 1:30 PM Medical Record Number: [id number] Patient Account Number: [id number] Date of Birth/Sex: 12-02-1935 (80 y.o. Male) Treating RN: Clover Mealy, RN, BSN, [place] Sink Primary Care Physician: Terance Hart, DAVID Other Clinician: Referring Physician: Dorothey Baseman Treating Physician/Extender: Rudene Re in Treatment: 44 Information Obtained from: Patient Chief Complaint Patient presents for treatment of an open ulcer due to venous insufficiency. The patient has had a open wound on his right medial ankle for at least 3 years and was last seen in the wound clinic in February 2015. He was lost to follow-up after that. Electronic Signature(s) Signed: 12/05/2015 1:40:00 PM By: Evlyn Kanner MD, FACS Entered By: Evlyn Kanner on 12/05/2015 13:40:00 TRAY, KLAYMAN ([id number]) -------------------------------------------------------------------------------- HPI Details Patient Name: [name], [name] Date of Service: 12/05/2015 1:30 PM Medical Record Number: [id number] Patient Account Number: [id number] Date of Birth/Sex: Jan 29, 1936 (80 y.o. Male) Treating RN: Clover Mealy, RN, BSN, [place] Sink Primary Care Physician: Terance Hart, DAVID Other Clinician: Referring Physician: Terance Hart, DAVID Treating Physician/Extender: Rudene Re in Treatment: 32 History of Present Illness Location: right lower extremity ulceration Quality: Patient reports experiencing a dull pain to affected area(s). Severity: Patient states wound are getting worse. 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 Modifying Factors: Consults to this date include:has received treatment in the wound center in the past but stopped coming here since February 2015 Associated Signs and Symptoms: Patient reports having difficulty standing for long periods. HPI Description: 80 year old male who is known to have progressive weakness and has been in a nursing home for a while has had chronic lower extremity edema both legs and a large open wound on the right lower extremity which is has at least for about 3-4 years. The patient was seen in the wound clinic before and has been treated until February 2015 when he was lost to follow-up. Past medical history is significant for hypertension, gout, venous stasis ulcers, Parkinson's [name], constipation. He's not been a diabetic but his last hemoglobin A1c was 6 in March 2015. There is documentation that he's had endovenous ablation of bilateral varicose veins but we have no documentation about this and this may be done at least 4-5 years ago. 01/22/2015 -- he has his vascular test is scheduled for this afternoon. 01/29/2015 -- the patient's vascular test was canceled last week because he was unable to get onto the examining bed at [facility]. His Unna's boot was not applied either and the patient had a dressing placed by home health. Today when his dressing was removed we found a lot of maggots in his right lower extremity. 02/07/2015 -- Was seen in the vascular office by the PA and she noted that a bilateral venous duplex study did not show any DVT, SVT or reflux bilaterally. The patient was advised to continue with Unna's boot and would at some stage to require graduated compression stockings of the 20-30 mmHg variety. In the future lymphedema pumps would also be considered. 02/14/2015 -- his dressing is smelling quite a bit and there is a discoloration of the wound suggestive of an infection. Deep tissue cultures will  be taken today. 02/21/2015 -- the  culture is back and it has growth moderate growth of Proteus and gram-negative rods sensitive to sulfa, ampicillin, cephazolin, Zosyn. He is already on doxycycline and his wound is looking clean and hence we will continue with this. 02/28/2015 -- he's been doing fine still on his antibiotics and has no fresh issues. 03/07/2015 -- I was told that because he lives in a nursing home the Apligraf was denied by his insurance company. 03/14/2015 -- we are awaiting samples and a trial of Puraply to be tried for his ulceration. 03/21/2015 -- his Apligraf was approved and he is here for his first application of Apligraf 03/28/2015 -- his wound has had quite a bit of secretion and needs to be changed and hence I will use the OWAIS, PRUETT. ([id number]) second application of Apligraf. 04/04/2015 -- he is here for a wound check but has a lot of secretions and hence his dressing needs to be taken down. 04/09/2015 -- he is here for his third application of Apligraf 04/18/2015 -- he is here for a wound check and though he does not have any overt infection he always has a foul odor to his leg. 04/25/2015 -- his wound has been doing much better and he has minimal drainage and the size is smaller. Of note he is on doxycycline. 05/02/2015 -- he is here for his fourth application of Apligraf 05/09/2015 -- he is here for a wound check and is bothered by a lot of odor from the wound. 05/16/2015 -- he has done very well, the odor and drainage is less with the wound dressing having been changed twice this week. He is here for his last application of Apligraf. 06/20/2015 -- an area superior to the previous wound has now opened up and this is a fairly superficial ulceration. 07/12/2015 -- he is here for his first application of Puraply. This is a sample and this is no charge. 07/18/2015 -- he is here for his second application of Puraply. This is a sample and this is no charge. 07/25/2015 -- he is here for his  third application of Puraply. This is a sample and this is no charge. 08/01/2015 -- he is here for his fourth application of Puraply. This is a sample and this is no charge. 08/08/2015 -- he is here for his fifth application of Puraply and this is a sample at no charge. 08/15/2015 -- he is here for his sixth application of Puraply and this is a sample at no charge. 08/29/2015 -- he is here for his eight application of Puraply and this is a sample at no charge. 09/05/2015 -- he is here for his nineth application of Puraply and this is a sample at no charge. 09/19/2015 -- this is the second week of using the collagen substitute and he is doing very well. 09/26/2015 -- this is the third week of using the collagen substitute. 10/03/2015 -- this is the fourth week of using the collagen substitute. 10/24/2015 --this is the seventh week of using the collagen substitute. 11/07/2015 -- last week he had not used the collagen substitute but today we have applied the eighth collagen substitute on his wound. 11/14/2015 -- he continues to have 2 small areas which are open and we will use the ninth application of the collagen substitute 11/21/2015 -- may continue to use the collagen substitute as he has tiny area still open. We have been trying to work with his home health or his nursing home  regarding getting this from the vendor Electronic Signature(s) Signed: 12/05/2015 1:40:11 PM By: Evlyn Kanner MD, FACS Entered By: Evlyn Kanner on 12/05/2015 13:40:11 BAIN, WHICHARD ([id number]) -------------------------------------------------------------------------------- Physical Exam Details Patient Name: [name], [name] Date of Service: 12/05/2015 1:30 PM Medical Record Number: [id number] Patient Account Number: [id number] Date of Birth/Sex: Jun 21, 1936 (80 y.o. Male) Treating RN: Clover Mealy, RN, BSN, [place] Sink Primary Care Physician: Terance Hart, DAVID Other Clinician: Referring Physician: Terance Hart, DAVID Treating  Physician/Extender: Rudene Re in Treatment: 46 Constitutional . Pulse regular. Respirations normal and unlabored. Afebrile. . Eyes Nonicteric. Reactive to light. Ears, Nose, Mouth, and Throat Lips, teeth, and gums WNL.Marland Kitchen Moist mucosa without lesions. Neck supple and nontender. No palpable supraclavicular or cervical adenopathy. Normal sized without goiter. Respiratory WNL. No retractions.. Cardiovascular Pedal Pulses WNL. No clubbing, cyanosis or edema. Lymphatic No adneopathy. No adenopathy. No adenopathy. Musculoskeletal Adexa without tenderness or enlargement.. Digits and nails w/o clubbing, cyanosis, infection, petechiae, ischemia, or inflammatory conditions.. Integumentary (Hair, Skin) No suspicious lesions. No crepitus or fluctuance. No peri-wound warmth or erythema. No masses.Marland Kitchen Psychiatric Judgement and insight Intact.. No evidence of depression, anxiety, or agitation.. Notes his lymphedema has gone down markedly and the original wound is now almost completely healed with no open areas. However he is court some satellite abrasions around the lateral calf area which continued to lose a bit. Electronic Signature(s) Signed: 12/05/2015 2:00:22 PM By: Evlyn Kanner MD, FACS Entered By: Evlyn Kanner on 12/05/2015 14:00:22 HIRAM, MCIVER ([id number]) -------------------------------------------------------------------------------- Physician Orders Details Patient Name: DARIUSH, MCNELLIS. Date of Service: 12/05/2015 1:30 PM Medical Record Number: [id number] Patient Account Number: [id number] Date of Birth/Sex: 04/01/1936 (80 y.o. 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 o No tub bath. Wound #7 Right,Proximal Lower Leg o Cleanse wound with mild soap and water o May shower with protection. o No tub bath. Skin Barriers/Peri-Wound Care Wound #5 Right,Medial Lower Leg o Barrier cream o Moisturizing lotion Wound #7 Right,Proximal Lower Leg o Barrier cream o Moisturizing lotion Primary Wound Dressing Wound #5 Right,Medial Lower Leg o Other: - RTD Wound #7 Right,Proximal Lower Leg o Other: - RTD Secondary Dressing Wound #5 Right,Medial Lower Leg ERVIN, [name] ([id number]) o ABD pad Wound #7 Right,Proximal Lower Leg o ABD pad Dressing Change Frequency Wound #5 Right,Medial Lower Leg o Change dressing every week Wound #7 Right,Proximal Lower Leg o Change dressing every week Follow-up Appointments Wound #5 Right,Medial Lower Leg o Return Appointment in 1 week. Wound #7 Right,Proximal Lower Leg o Return Appointment in 1 week. Edema Control Wound #5 Right,Medial Lower Leg o 3 Layer Compression System - Right Lower Extremity o Elevate legs to the level of the heart and pump ankles as often as possible Wound #7 Right,Proximal Lower Leg o 3 Layer Compression System - Right Lower Extremity o Elevate legs to the level of the heart and pump ankles as often as possible Electronic Signature(s) Signed: 12/05/2015 4:11:18 PM By: Evlyn Kanner MD, FACS  Signed: 12/05/2015 5:34:58 PM By: Elpidio Eric BSN, RN Entered By: Elpidio Eric on 12/05/2015 13:56:08 NASIIR, MONTS  ([id number]) -------------------------------------------------------------------------------- Problem List Details Patient Name: [name], [name] Date of Service: 12/05/2015 1:30 PM Medical Record Number: [id number] Patient Account Number: [id number] Date of Birth/Sex: 1936/04/13 (80 y.o. 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 Date of Service: 12/05/2015 1:30 PM Medical Record Number: [id number] Patient Account Number: [id number] Date of Birth/Sex: [birth year] (79 y.o. Male) Treating RN: Clover Mealy, RN, BSN, [place] Sink Primary Care Physician: Terance Hart, DAVID Other Clinician: Referring Physician: Dorothey Baseman Treating Physician/Extender: Rudene Re in Treatment: 41 Subjective Chief Complaint Information obtained from Patient Patient presents for treatment of an open ulcer due to venous insufficiency. The patient has had a open wound on his right medial ankle for at least 3 years and was last seen in the wound clinic in February 2015. He was lost to follow-up after that. History of Present Illness (HPI) The  following HPI elements were documented for the patient's wound: Location: right lower extremity ulceration Quality: Patient reports experiencing a dull pain to affected area(s). Severity: Patient states wound are getting worse. 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 Modifying Factors: Consults to this date include:has received treatment in the wound center in the past but stopped coming here since February 2015 Associated Signs and Symptoms: Patient reports having difficulty standing for long periods. 80 year old male who is known to have progressive weakness and has been in a nursing home for a while has had chronic lower extremity edema both legs and a large open wound on the right lower extremity which is has at least for about 3-4 years. The patient was seen in the wound clinic before and has been treated until February 2015 when he was lost to follow-up. Past medical history is significant for hypertension, gout, venous stasis ulcers, Parkinson's [name], constipation. He's not been a diabetic but his last hemoglobin A1c was 6 in March 2015. There is documentation that he's had endovenous ablation of bilateral varicose veins but we have no documentation about this and this may be done at least 4-5 years ago. 01/22/2015 -- he has his vascular test is scheduled for this afternoon. 01/29/2015 -- the patient's vascular test was canceled last week because he was unable to get onto the examining bed at [facility]. His Unna's boot was not applied either and the patient had a dressing placed by home health. Today when his dressing was removed we found a lot of maggots in his right lower  extremity. 02/07/2015 -- Was seen in the  vascular office by the PA and she noted that a bilateral venous duplex study did not show any DVT, SVT or reflux bilaterally. The patient was advised to continue with Unna's boot and would at some stage to require graduated compression stockings of the 20-30 mmHg variety. In the future lymphedema pumps would also be considered. 02/14/2015 -- his dressing is smelling quite a bit and there is a discoloration of the wound suggestive of an TREMANE, SPURGEON. ([id number]) infection. Deep tissue cultures will be taken today. 02/21/2015 -- the culture is back and it has growth moderate growth of Proteus and gram-negative rods sensitive to sulfa, ampicillin, cephazolin, Zosyn. He is already on doxycycline and his wound is looking clean and hence we will continue with this. 02/28/2015 -- he's been doing fine still on his antibiotics and has no fresh issues. 03/07/2015 -- I was told that because he lives in a nursing home the Apligraf was denied by his insurance company. 03/14/2015 -- we are awaiting samples and a trial of Puraply to be tried for his ulceration. 03/21/2015 -- his Apligraf was approved and he is here for his first application of Apligraf 03/28/2015 -- his wound has had quite a bit of secretion and needs to be changed and hence I will use the second application of Apligraf. 04/04/2015 -- he is here for a wound check but has a lot of secretions and hence his dressing needs to be taken down. 04/09/2015 -- he is here for his third application of Apligraf 04/18/2015 -- he is here for a wound check and though he does not have any overt infection he always has a foul odor to his leg. 04/25/2015 -- his wound has been doing much better and he has minimal drainage and the size is smaller. Of note he is on doxycycline. 05/02/2015 -- he is here for his fourth application of Apligraf 05/09/2015 -- he is here for a wound check and is bothered by a lot of odor  from the wound. 05/16/2015 -- he has done very well, the odor and drainage is less with the wound dressing having been changed twice this week. He is here for his last application of Apligraf. 06/20/2015 -- an area superior to the previous wound has now opened up and this is a fairly superficial ulceration. 07/12/2015 -- he is here for his first application of Puraply. This is a sample and this is no charge. 07/18/2015 -- he is here for his second application of Puraply. This is a sample and this is no charge. 07/25/2015 -- he is here for his third application of Puraply. This is a sample and this is no charge. 08/01/2015 -- he is here for his fourth application of Puraply. This is a sample and this is no charge. 08/08/2015 -- he is here for his fifth application of Puraply and this is a sample at no charge. 08/15/2015 -- he is here for his sixth application of Puraply and this is a sample at no charge. 08/29/2015 -- he is here for his eight application of Puraply and this is a sample at no charge. 09/05/2015 -- he is here for his nineth application of Puraply and this is a sample at no charge. 09/19/2015 -- this is the second week of using the collagen substitute and he is doing very well. 09/26/2015 -- this is the third week of using the collagen substitute. 10/03/2015 -- this is the fourth week of using the collagen substitute. 10/24/2015 --this is the seventh week of  using the collagen substitute. 11/07/2015 -- last week he had not used the collagen substitute but today we have applied the eighth collagen substitute on his wound. 11/14/2015 -- he continues to have 2 small areas which are open and we will use the ninth application of the collagen substitute 11/21/2015 -- may continue to use the collagen substitute as he has tiny area still open. We have been trying to work with his home health or his nursing home regarding getting this from the vendor [employer] DEARL, RUDDEN.  ([id number]) Constitutional Pulse regular. Respirations normal and unlabored. Afebrile. Vitals Time Taken: 1:35 PM, Height: 68 in, Temperature: 97.7 F, Pulse: 58 bpm, Respiratory Rate: 18 breaths/min, Blood Pressure: 139/81 mmHg. Eyes Nonicteric. Reactive to light. Ears, Nose, Mouth, and Throat Lips, teeth, and gums WNL.Marland Kitchen Moist mucosa without lesions. Neck supple and nontender. No palpable supraclavicular or cervical adenopathy. Normal sized without goiter. Respiratory WNL. No retractions.. Cardiovascular Pedal Pulses WNL. No clubbing, cyanosis or edema. Lymphatic No adneopathy. No adenopathy. No adenopathy. Musculoskeletal Adexa without tenderness or enlargement.. Digits and nails w/o clubbing, cyanosis, infection, petechiae, ischemia, or inflammatory conditions.Marland Kitchen Psychiatric Judgement and insight Intact.. No evidence of depression, anxiety, or agitation.. General Notes: his lymphedema has gone down markedly and the original wound is now almost completely healed with no open areas. However he is court some satellite abrasions around the lateral calf area which continued to lose a bit. Integumentary (Hair, Skin) No suspicious lesions. No crepitus or fluctuance. No peri-wound warmth or erythema. No masses.. Wound #5 status is Open. Original cause of wound was Gradually Appeared. The wound is located on the Right,Medial Lower Leg. The wound measures 1.5cm length x 1cm width x 0.1cm depth; 1.178cm^2 area and 0.118cm^3 volume. The wound is limited to skin breakdown. There is no tunneling or undermining noted. There is a large amount of serosanguineous drainage noted. The wound margin is distinct with the outline attached to the wound base. There is large (67-100%) granulation within the wound bed. There is a small (1-33%) amount of necrotic tissue within the wound bed including Adherent Slough. The periwound skin appearance exhibited: Moist, Hemosiderin Staining. The periwound skin  appearance did not exhibit: Callus, Crepitus, Excoriation, Fluctuance, Friable, Induration, Localized Edema, Rash, Scarring, Dry/Scaly, Maceration, Atrophie Blanche, Cyanosis, Ecchymosis, Mottled, Pallor, Rubor, Erythema. Periwound JAQWAN, WIEBER. ([id number]) temperature was noted as No Abnormality. Wound #7 status is Open. Original cause of wound was Blister. The wound is located on the Right,Proximal Lower Leg. The wound measures 7.5cm length x 6cm width x 0.1cm depth; 35.343cm^2 area and 3.534cm^3 volume. The wound is limited to skin breakdown. There is no tunneling or undermining noted. There is a medium amount of serosanguineous drainage noted. The wound margin is indistinct and nonvisible. There is large (67-100%) granulation within the wound bed. There is no necrotic tissue within the wound bed. The periwound skin appearance exhibited: Moist. The periwound skin appearance did not exhibit: Callus, Crepitus, Excoriation, Fluctuance, Friable, Induration, Localized Edema, Rash, Scarring, Dry/Scaly, Maceration, Atrophie Blanche, Cyanosis, Ecchymosis, Hemosiderin Staining, Mottled, Pallor, Rubor, Erythema. Periwound temperature was noted as No Abnormality. 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 No tub bath. Wound #7 Right,Proximal  Lower Leg: Cleanse wound with mild soap and water May shower with protection. No tub bath. Skin Barriers/Peri-Wound Care: Wound #5 Right,Medial Lower Leg: Barrier cream Moisturizing lotion Wound #7 Right,Proximal Lower Leg: Barrier cream Moisturizing lotion Primary Wound  Dressing: Wound #5 Right,Medial Lower Leg: TORREY, BALLINAS ([id number]) Other: - RTD Wound #7 Right,Proximal Lower Leg: Other: - RTD Secondary Dressing: Wound #5 Right,Medial Lower Leg: ABD pad Wound #7 Right,Proximal Lower Leg: ABD pad Dressing Change Frequency: Wound #5 Right,Medial Lower Leg: Change dressing every week Wound #7 Right,Proximal Lower Leg: Change dressing every week Follow-up Appointments: Wound #5 Right,Medial Lower Leg: Return Appointment in 1 week. Wound #7 Right,Proximal Lower Leg: Return Appointment in 1 week. Edema Control: Wound #5 Right,Medial Lower Leg: 3 Layer Compression System - Right Lower Extremity Elevate legs to the level of the heart and pump ankles as often as possible Wound #7 Right,Proximal Lower Leg: 3 Layer Compression System - Right Lower Extremity Elevate legs to the level of the heart and pump ankles as often as possible I have recommended some RTD over his wound and we will apply some ABD and then compress him with a 3-layer Profore. We have heard back from his nursing home and they spoke to our nurses here, and stated that they will not be able to use lymphedema pumps at the nursing home. I may need to speak with the nursing supervisor at this place or the director of the nursing home. I have asked him to bring his compression stockings the next week when he is here Electronic Signature(s) Signed: 12/05/2015 2:02:53 PM By: Evlyn Kanner MD, FACS Entered By: Evlyn Kanner on 12/05/2015 14:02:53 MOMODOU, CONSIGLIO ([id number]) -------------------------------------------------------------------------------- SuperBill Details Patient Name: [name] Date of Service: 12/05/2015 Medical Record Number: [id number] Patient Account Number: [id number] Date of Birth/Sex: April 28, 1936 (80 y.o. 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 E66.01 Morbid (severe) obesity due to excess calories I89.0 Lymphedema, not elsewhere classified Facility Procedures CPT4: Description Modifier Quantity Code [id number] (Facility Use Only) [phone] - APPLY MULTLAY COMPRS LWR RT 1 LEG Physician Procedures CPT4 Code Description: 4132440 10272 - WC PHYS LEVEL 3 - EST PT ICD-10 Description Diagnosis I87.311 Chronic venous hypertension (idiopathic) with ulcer of 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 E66.01 Morbid (severe) obesity due  to excess calories I89.0 Lymphedema, not elsewhere classified Modifier: right lower s of muscle Quantity: 1 extremity Electronic Signature(s) Signed: 12/05/2015 2:18:14 PM By: Elpidio Eric BSN, RN Signed: 12/05/2015 4:11:18 PM By: Evlyn Kanner MD, FACS Previous Signature: 12/05/2015 2:03:10 PM Version By: Evlyn Kanner MD, FACS Entered By: Elpidio Eric on 12/05/2015 14:18:14

## 2015-12-06 NOTE — Progress Notes (Signed)
[name] ([id number]) Visit Report for 12/05/2015 Arrival Information Details Patient Name: [name], [name] Date of Service: 12/05/2015 1:30 PM Medical Record Number: [id number] Patient Account Number: [id number] Date of Birth/Sex: 05-Mar-1936 (80 y.o. Male) Treating RN: Clover Mealy, RN, BSN, [place] Sink Primary Care Physician: Terance Hart, DAVID Other Clinician: Referring Physician: Terance Hart, DAVID Treating Physician/Extender: Rudene Re in Treatment: 106 Visit Information History Since Last Visit Added or deleted any medications: No Patient Arrived: Wheel Chair Any new allergies or adverse reactions: No Arrival Time: 13:33 Had a fall or experienced change in No activities of daily living that may affect Accompanied By: self risk of falls: Transfer Assistance: None Hospitalized since last visit: No Patient Identification Verified: Yes Has Dressing in Place as Prescribed: Yes Secondary Verification Process Yes Has Compression in Place as Prescribed: Yes Completed: Pain Present Now: No Patient Requires Transmission-Based No Precautions: Patient Has Alerts: No Electronic Signature(s) Signed: 12/05/2015 5:34:58 PM By: Elpidio Eric BSN, RN Entered By: Elpidio Eric on 12/05/2015 13:34:03 [name] ([id number]) -------------------------------------------------------------------------------- Encounter Discharge Information Details Patient Name: [name] Date of Service: 12/05/2015 1:30 PM Medical Record Number: [id number] Patient Account Number: [id number] Date of Birth/Sex: 14-Feb-1936 (80 y.o. Male) Treating RN: Clover Mealy, RN, BSN, [place] Sink Primary Care Physician: Terance Hart, DAVID Other Clinician: Referring Physician: Terance Hart DAVID Treating Physician/Extender: Rudene Re in Treatment: 45 Encounter Discharge Information Items Discharge Pain Level: 0 Discharge Condition: Stable Ambulatory Status: Wheelchair Discharge Destination: Nursing Home Transportation:  Other self/transport Accompanied By: guy Schedule Follow-up Appointment: No Medication Reconciliation completed and provided to Patient/Care No Tameya Kuznia: Provided on Clinical Summary of Care: 12/05/2015 Form Type Recipient Paper Patient [facility] Electronic Signature(s) Signed: 12/05/2015 2:20:45 PM By: Elpidio Eric BSN, RN Previous Signature: 12/05/2015 2:07:04 PM Version By: Gwenlyn Perking Entered By: Elpidio Eric on 12/05/2015 14:20:45 [name] ([id number]) -------------------------------------------------------------------------------- Lower Extremity Assessment Details Patient Name: [name] Date of Service: 12/05/2015 1:30 PM Medical Record Number: [id number] Patient Account Number: [id number] Date of Birth/Sex: [birth year] (80 y.o. Male) Treating RN: Clover Mealy, RN, BSN, [place] Sink Primary Care Physician: Terance Hart, DAVID Other Clinician: Referring Physician: Terance Hart, DAVID Treating Physician/Extender: Rudene Re in Treatment: 46 Edema Assessment Assessed: [Left: No] [Right: No] Edema: [Left: N] [Right: o] Calf Left: Right: Point of Measurement: 40 cm From Medial Instep cm 36.1 cm Ankle Left: Right: Point of Measurement: 12 cm From Medial Instep cm 25 cm Vascular Assessment Claudication: Claudication Assessment [Right:None] Pulses: Posterior Tibial Dorsalis Pedis Palpable: [Right:Yes] Extremity colors, hair growth, and conditions: Extremity Color: [Right:Hyperpigmented] Hair Growth on Extremity: [Right:No] Temperature of Extremity: [Right:Warm] Capillary Refill: [Right:< 3 seconds] Toe Nail Assessment Left: Right: Thick: Yes Discolored: Yes Deformed: Yes Improper Length and Hygiene: Yes Electronic Signature(s) Signed: 12/05/2015 5:34:58 PM By: Elpidio Eric BSN, RN Entered By: Elpidio Eric on 12/05/2015 13:46:37 Esgar, Barnick Chrissie Noa ([id number]) BRENTYN, SEEHAFER  ([id number]) -------------------------------------------------------------------------------- Multi Wound Chart Details Patient Name: [name] Date of Service: 12/05/2015 1:30 PM Medical Record Number: [id number] Patient Account Number: [id number] Date of Birth/Sex: [birth year] (80 y.o. Male) Treating RN: Clover Mealy, RN, BSN, [place] Sink Primary Care Physician: Terance Hart, DAVID Other Clinician: Referring Physician: Terance Hart, DAVID Treating Physician/Extender: Rudene Re in Treatment: 46 Vital Signs Height(in): 68 Pulse(bpm): 58 Weight(lbs): Blood Pressure 139/81 (mmHg): Body Mass Index(BMI): Temperature(F): 97.7 Respiratory Rate 18 (breaths/min): Photos: [5:No Photos] [7:No Photos] [N/A:N/A] Wound Location: [5:Right Lower Leg - Medial Right Lower Leg -] [7:Proximal] [N/A:N/A] Wounding Event: [5:Gradually Appeared] [7:Blister] [N/A:N/A] Primary Etiology: [5:Venous Leg Ulcer] [7:Venous Leg Ulcer] [N/A:N/A] Comorbid History: [  5:Cataracts, Anemia, Hypertension, Osteoarthritis] [7:Cataracts, Anemia, Hypertension, Osteoarthritis] [N/A:N/A] Date Acquired: [5:11/13/2014] [7:11/11/2015] [N/A:N/A] Weeks of Treatment: [5:46] [7:3] [N/A:N/A] Wound Status: [5:Open] [7:Open] [N/A:N/A] Measurements L x W x D 1.5x1x0.1 [7:7.5x6x0.1] [N/A:N/A] (cm) Area (cm) : [5:1.178] [7:35.343] [N/A:N/A] Volume (cm) : [5:0.118] [7:3.534] [N/A:N/A] % Reduction in Area: [5:98.90%] [7:-17932.10%] [N/A:N/A] % Reduction in Volume: 99.60% [7:-17570.00%] [N/A:N/A] Classification: [5:Full Thickness Without Exposed Support Structures] [7:Partial Thickness] [N/A:N/A] Exudate Amount: [5:Large] [7:Medium] [N/A:N/A] Exudate Type: [5:Serosanguineous] [7:Serosanguineous] [N/A:N/A] Exudate Color: [5:red, brown] [7:red, brown] [N/A:N/A] Wound Margin: [5:Distinct, outline attached Indistinct, nonvisible] [N/A:N/A] Granulation Amount: [5:Large (67-100%)] [7:Large (67-100%)] [N/A:N/A] Necrotic Amount: [5:Small  (1-33%)] [7:None Present (0%)] [N/A:N/A] Exposed Structures: [5:Fascia: No Fat: No Tendon: No Muscle: No] [7:Fascia: No Fat: No Tendon: No Muscle: No] [N/A:N/A] Joint: No Joint: No Bone: No Bone: No Limited to Skin Limited to Skin Breakdown Breakdown Epithelialization: Medium (34-66%) None N/A Periwound Skin Texture: Edema: No Edema: No N/A Excoriation: No Excoriation: No Induration: No Induration: No Callus: No Callus: No Crepitus: No Crepitus: No Fluctuance: No Fluctuance: No Friable: No Friable: No Rash: No Rash: No Scarring: No Scarring: No Periwound Skin Moist: Yes Moist: Yes N/A Moisture: Maceration: No Maceration: No Dry/Scaly: No Dry/Scaly: No Periwound Skin Color: Hemosiderin Staining: Yes Atrophie Blanche: No N/A Atrophie Blanche: No Cyanosis: No Cyanosis: No Ecchymosis: No Ecchymosis: No Erythema: No Erythema: No Hemosiderin Staining: No Mottled: No Mottled: No Pallor: No Pallor: No Rubor: No Rubor: No Temperature: No Abnormality No Abnormality N/A Tenderness on No No N/A Palpation: Wound Preparation: Ulcer Cleansing: Other: Ulcer Cleansing: Other: N/A surg scrub and soap surg scrub and water Topical Anesthetic Topical Anesthetic Applied: Other: lidocaine Applied: None 4% Treatment Notes Electronic Signature(s) Signed: 12/05/2015 5:34:58 PM By: Elpidio Eric BSN, RN Entered By: Elpidio Eric on 12/05/2015 13:54:57 TRAVER, MECKES ([id number]) -------------------------------------------------------------------------------- Multi-Disciplinary Care Plan Details Patient Name: ELSTER, CORBELLO. Date of Service: 12/05/2015 1:30 PM Medical Record Number: [id number] Patient Account Number: [id number] Date of Birth/Sex: 05/21/1936 (80 y.o. Male) Treating RN: Clover Mealy, RN, BSN, [place] Sink Primary Care Physician: Terance Hart, DAVID Other Clinician: Referring Physician: Terance Hart DAVID Treating Physician/Extender: Rudene Re in Treatment: 81 Active  Inactive Orientation to the Wound Care Program Nursing Diagnoses: Knowledge deficit related to the wound healing center program Goals: Patient/caregiver will verbalize understanding of the Wound Healing Center Program Date Initiated: 01/15/2015 Goal Status: Active Interventions: Provide education on orientation to the wound center Notes: Venous Leg Ulcer Nursing Diagnoses: Knowledge deficit related to disease process and management Potential for venous Insuffiency (use before diagnosis confirmed) Goals: Patient will maintain optimal edema control Date Initiated: 01/15/2015 Goal Status: Active Patient/caregiver will verbalize understanding of disease process and disease management Date Initiated: 01/15/2015 Goal Status: Active Verify adequate tissue perfusion prior to therapeutic compression application Date Initiated: 01/15/2015 Goal Status: Active Interventions: Assess peripheral edema status every visit. Compression as ordered Provide education on venous insufficiency LASH, MATULICH ([id number]) Treatment Activities: Test ordered outside of clinic : 10/24/2015 Therapeutic compression applied : 10/24/2015 Venous Duplex Doppler : 10/24/2015 Notes: Wound/Skin Impairment Nursing Diagnoses: Impaired tissue integrity Knowledge deficit related to ulceration/compromised skin integrity Goals: Patient/caregiver will verbalize understanding of skin care regimen Date Initiated: 01/15/2015 Goal Status: Active Ulcer/skin breakdown will have a volume reduction of 30% by week 4 Date Initiated: 01/15/2015 Goal Status: Active Ulcer/skin breakdown will have a volume reduction of 50% by week 8 Date Initiated: 01/15/2015 Goal Status: Active Ulcer/skin breakdown will have a volume reduction of 80% by week 12 Date Initiated: 01/15/2015 Goal Status:  Active Ulcer/skin breakdown will heal within 14 weeks Date Initiated: 01/15/2015 Goal Status: Active Interventions: Assess patient/caregiver  ability to perform ulcer/skin care regimen upon admission and as needed Assess ulceration(s) every visit Provide education on ulcer and skin care Notes: Electronic Signature(s) Signed: 12/05/2015 5:34:58 PM By: Elpidio Eric BSN, RN Entered By: Elpidio Eric on 12/05/2015 13:54:48 MEIR, JENTSCH ([id number]) -------------------------------------------------------------------------------- Pain Assessment Details Patient Name: [name] Date of Service: 12/05/2015 1:30 PM Medical Record Number: [id number] Patient Account Number: [id number] Date of Birth/Sex: 01/13/1936 (80 y.o. Male) Treating RN: Clover Mealy, RN, BSN, [place] Sink Primary Care Physician: Terance Hart, DAVID Other Clinician: Referring Physician: Dorothey Baseman Treating Physician/Extender: Rudene Re in Treatment: 46 Active Problems Location of Pain Severity and Description of Pain Patient Has Paino No Site Locations Pain Management and Medication Current Pain Management: Electronic Signature(s) Signed: 12/05/2015 5:34:58 PM By: Elpidio Eric BSN, RN Entered By: Elpidio Eric on 12/05/2015 13:34:14 [name] ([id number]) -------------------------------------------------------------------------------- Patient/Caregiver Education Details Patient Name: [name] Date of Service: 12/05/2015 1:30 PM Medical Record Number: [id number] Patient Account Number: [id number] Date of Birth/Gender: 01-07-1936 (80 y.o. Male) Treating RN: Clover Mealy, RN, BSN, [place] Sink Primary Care Physician: Terance Hart, DAVID Other Clinician: Referring Physician: Terance Hart DAVID Treating Physician/Extender: Rudene Re in Treatment: 75 Education Assessment Education Provided To: Patient Education Topics Provided Venous: Methods: Explain/Verbal Responses: State content correctly Welcome To The Wound Care Center: Methods: Explain/Verbal Responses: State content correctly Wound/Skin Impairment: Methods: Explain/Verbal Responses: State  content correctly Electronic Signature(s) Signed: 12/05/2015 2:21:23 PM By: Elpidio Eric BSN, RN Entered By: Elpidio Eric on 12/05/2015 14:21:23 NICKSON, DIMARE ([id number]) -------------------------------------------------------------------------------- Wound Assessment Details Patient Name: [name] Date of Service: 12/05/2015 1:30 PM Medical Record Number: [id number] Patient Account Number: [id number] Date of Birth/Sex: 04-Apr-1936 (80 y.o. Male) Treating RN: Clover Mealy, RN, BSN, Rita Primary Care Physician: Terance Hart, DAVID Other Clinician: Referring Physician: Terance Hart, DAVID Treating Physician/Extender: Rudene Re in Treatment: 46 Wound Status Wound Number: 5 Primary Venous Leg Ulcer Etiology: Wound Location: Right Lower Leg - Medial Wound Status: Open Wounding Event: Gradually Appeared Comorbid Cataracts, Anemia, Hypertension, Date Acquired: 11/13/2014 History: Osteoarthritis Weeks Of Treatment: 46 Clustered Wound: No Photos Photo Uploaded By: Elpidio Eric on 12/05/2015 14:56:06 Wound Measurements Length: (cm) 1.5 Width: (cm) 1 Depth: (cm) 0.1 Area: (cm) 1.178 Volume: (cm) 0.118 % Reduction in Area: 98.9% % Reduction in Volume: 99.6% Epithelialization: Medium (34-66%) Tunneling: No Undermining: No Wound Description Full Thickness Without Exposed Classification: Support Structures Wound Margin: Distinct, outline attached Exudate Large Amount: Exudate Type: Serosanguineous Exudate Color: red, brown Foul Odor After Cleansing: No Wound Bed Granulation Amount: Large (67-100%) Exposed Structure Necrotic Amount: Small (1-33%) Fascia Exposed: No Necrotic Quality: Adherent Slough Fat Layer Exposed: No VANNY, GOETTE ([id number]) Tendon Exposed: No Muscle Exposed: No Joint Exposed: No Bone Exposed: No Limited to Skin Breakdown Periwound Skin Texture Texture Color No Abnormalities Noted: No No Abnormalities Noted: No Callus: No Atrophie  Blanche: No Crepitus: No Cyanosis: No Excoriation: No Ecchymosis: No Fluctuance: No Erythema: No Friable: No Hemosiderin Staining: Yes Induration: No Mottled: No Localized Edema: No Pallor: No Rash: No Rubor: No Scarring: No Temperature / Pain Moisture Temperature: No Abnormality No Abnormalities Noted: No Dry / Scaly: No Maceration: No Moist: Yes Wound Preparation Ulcer Cleansing: Other: surg scrub and soap, Topical Anesthetic Applied: Other: lidocaine 4%, Treatment Notes Wound #5 (Right, Medial Lower Leg) 1. Cleansed with: Cleanse wound with antibacterial soap and water 3. Peri-wound Care: Barrier cream 4. Dressing Applied: Other dressing (  specify in notes) 5. Secondary Dressing Applied ABD Pad 7. Secured with 3 Layer Compression System - Right Lower Extremity Electronic Signature(s) Signed: 12/05/2015 5:34:58 PM By: Elpidio Eric BSN, RN Entered By: Elpidio Eric on 12/05/2015 13:50:20 ABDULMALIK, DARCO ([id number]) -------------------------------------------------------------------------------- Wound Assessment Details Patient Name: JARROD, MCENERY. Date of Service: 12/05/2015 1:30 PM Medical Record Number: [id number] Patient Account Number: [id number] Date of Birth/Sex: 1936-01-15 (80 y.o. Male) Treating RN: Clover Mealy, RN, BSN, Rita Primary Care Physician: Terance Hart, DAVID Other Clinician: Referring Physician: Terance Hart, DAVID Treating Physician/Extender: Rudene Re in Treatment: 46 Wound Status Wound Number: 7 Primary Venous Leg Ulcer Etiology: Wound Location: Right Lower Leg - Proximal Wound Status: Open Wounding Event: Blister Comorbid Cataracts, Anemia, Hypertension, Date Acquired: 11/11/2015 History: Osteoarthritis Weeks Of Treatment: 3 Clustered Wound: No Photos Photo Uploaded By: Elpidio Eric on 12/05/2015 14:56:23 Wound Measurements Length: (cm) 7.5 Width: (cm) 6 Depth: (cm) 0.1 Area: (cm) 35.343 Volume: (cm) 3.534 % Reduction in  Area: -17932.1% % Reduction in Volume: -17570% Epithelialization: None Tunneling: No Undermining: No Wound Description Classification: Partial Thickness Wound Margin: Indistinct, nonvisible Exudate Amount: Medium Exudate Type: Serosanguineous Exudate Color: red, brown CHANEY, MACLAREN ([id number]) Wound Bed Granulation Amount: Large (67-100%) Exposed Structure Necrotic Amount: None Present (0%) Fascia Exposed: No Fat Layer Exposed: No Tendon Exposed: No Muscle Exposed: No Joint Exposed: No Bone Exposed: No Limited to Skin Breakdown Periwound Skin Texture Texture Color No Abnormalities Noted: No No Abnormalities Noted: No Callus: No Atrophie Blanche: No Crepitus: No Cyanosis: No Excoriation: No Ecchymosis: No Fluctuance: No Erythema: No Friable: No Hemosiderin Staining: No Induration: No Mottled: No Localized Edema: No Pallor: No Rash: No Rubor: No Scarring: No Temperature / Pain Moisture Temperature: No Abnormality No Abnormalities Noted: No Dry / Scaly: No Maceration: No Moist: Yes Wound Preparation Ulcer Cleansing: Other: surg scrub and water, Topical Anesthetic Applied: None Treatment Notes Wound #7 (Right, Proximal Lower Leg) 1. Cleansed with: Cleanse wound with antibacterial soap and water 3. Peri-wound Care: Barrier cream 4. Dressing Applied: Other dressing (specify in notes) 5. Secondary Dressing Applied ABD Pad 7. Secured with 3 Layer Compression System - Right Lower Extremity Electronic Signature(s) DARIO, YONO ([id number]) Signed: 12/05/2015 5:34:58 PM By: Elpidio Eric BSN, RN Entered By: Elpidio Eric on 12/05/2015 13:51:00 NOLBERTO, CHEUVRONT ([id number]) -------------------------------------------------------------------------------- Vitals Details Patient Name: [name] Date of Service: 12/05/2015 1:30 PM Medical Record Number: [id number] Patient Account Number: [id number] Date of Birth/Sex: 05-19-1936 (80 y.o.  Male) Treating RN: Clover Mealy, RN, BSN, Rita Primary Care Physician: Terance Hart, DAVID Other Clinician: Referring Physician: Terance Hart, DAVID Treating Physician/Extender: Rudene Re in Treatment: 46 Vital Signs Time Taken: 13:35 Temperature (F): 97.7 Height (in): 68 Pulse (bpm): 58 Respiratory Rate (breaths/min): 18 Blood Pressure (mmHg): 139/81 Reference Range: 80 - 120 mg / dl Electronic Signature(s) Signed: 12/05/2015 5:34:58 PM By: Elpidio Eric BSN, RN Entered By: Elpidio Eric on 12/05/2015 13:36:02

## 2015-12-12 ENCOUNTER — Encounter: Payer: Medicare Other | Admitting: Surgery

## 2015-12-12 DIAGNOSIS — I87311 Chronic venous hypertension (idiopathic) with ulcer of right lower extremity: Secondary | ICD-10-CM | POA: Diagnosis not present

## 2015-12-14 NOTE — Progress Notes (Signed)
YAO, HYPPOLITE ([id number]) Visit Report for 12/12/2015 Arrival Information Details Patient Name: [name], [name] Date of Service: 12/12/2015 10:00 AM Medical Record Number: [id number] Patient Account Number: [id number] Date of Birth/Sex: 27-Nov-1935 (80 y.o. Male) Treating RN: Huel Coventry Primary Care Physician: Dorothey Baseman Other Clinician: Referring Physician: Dorothey Baseman Treating Physician/Extender: Rudene Re in Treatment: 64 Visit Information History Since Last Visit Added or deleted any medications: No Patient Arrived: Wheel Chair Any new allergies or adverse reactions: No Arrival Time: 09:53 Had a fall or experienced change in No activities of daily living that may affect Accompanied By: self risk of falls: Transfer Assistance: None Signs or symptoms of abuse/neglect since last No Patient Identification Verified: Yes visito Secondary Verification Process Yes Hospitalized since last visit: No Completed: Has Dressing in Place as Prescribed: Yes Patient Requires Transmission-Based No Pain Present Now: No Precautions: Patient Has Alerts: No Electronic Signature(s) Signed: 12/13/2015 4:33:28 PM By: Elliot Gurney, RN, BSN, Kim RN, BSN Entered By: Elliot Gurney, RN, BSN, Kim on 12/12/2015 09:54:23 [name] ([id number]) -------------------------------------------------------------------------------- Encounter Discharge Information Details Patient Name: [name], [name] Date of Service: 12/12/2015 10:00 AM Medical Record Number: [id number] Patient Account Number: [id number] Date of Birth/Sex: Sep 05, 1935 (80 y.o. Male) Treating RN: Huel Coventry Primary Care Physician: Terance Hart, DAVID Other Clinician: Referring Physician: Dorothey Baseman Treating Physician/Extender: Rudene Re in Treatment: 20 Encounter Discharge Information Items Discharge Pain Level: 0 Discharge Condition: Stable Ambulatory Status: Wheelchair Discharge Destination: Nursing  Home Transportation: Private Auto Accompanied By: self Schedule Follow-up Appointment: Yes Medication Reconciliation completed Yes and provided to Patient/Care Skylynne Schlechter: Provided on Clinical Summary of Care: 12/12/2015 Form Type Recipient Paper Patient [facility] Electronic Signature(s) Signed: 12/12/2015 10:42:31 AM By: Gwenlyn Perking Entered By: Gwenlyn Perking on 12/12/2015 10:42:31 [name], [name] ([id number]) -------------------------------------------------------------------------------- Lower Extremity Assessment Details Patient Name: [name] Date of Service: 12/12/2015 10:00 AM Medical Record Number: [id number] Patient Account Number: [id number] Date of Birth/Sex: Sep 01, 1935 (80 y.o. Male) Treating RN: Huel Coventry Primary Care Physician: Terance Hart, DAVID Other Clinician: Referring Physician: Terance Hart, DAVID Treating Physician/Extender: Rudene Re in Treatment: 47 Edema Assessment Assessed: [Left: No] [Right: No] E[Left: dema] [Right: :] Calf Left: Right: Point of Measurement: 40 cm From Medial Instep cm 38.1 cm Ankle Left: Right: Point of Measurement: 12 cm From Medial Instep cm 26 cm Vascular Assessment Pulses: Posterior Tibial Palpable: [Right:Yes] Dorsalis Pedis Palpable: [Right:Yes] Doppler: [Right:Monophasic] Extremity colors, hair growth, and conditions: Extremity Color: [Right:Hyperpigmented] Hair Growth on Extremity: [Right:No] Temperature of Extremity: [Right:Warm] Capillary Refill: [Right:> 3 seconds] Toe Nail Assessment Left: Right: Thick: Yes Discolored: Yes Deformed: Yes Improper Length and Hygiene: Yes Electronic Signature(s) Signed: 12/13/2015 4:33:28 PM By: Elliot Gurney, RN, BSN, Kim RN, BSN Entered By: Elliot Gurney, RN, BSN, Kim on 12/12/2015 10:04:31 [name], [name] ([id number]) [name], [name] ([id number]) -------------------------------------------------------------------------------- Multi Wound Chart Details Patient Name: [name], [name] Date of Service: 12/12/2015 10:00 AM Medical Record Number: [id number] Patient Account Number: [id number] Date of Birth/Sex: Jul 13, 1935 (80 y.o. Male) Treating RN: Huel Coventry Primary Care Physician: Dorothey Baseman Other Clinician: Referring Physician: Terance Hart, DAVID Treating Physician/Extender: Rudene Re in Treatment: 61 Vital Signs Height(in): 68 Pulse(bpm): 62 Weight(lbs): Blood Pressure 97/55 (mmHg): Body Mass Index(BMI): Temperature(F): 98.2 Respiratory Rate 18 (breaths/min): Photos: [5:No Photos] [7:No Photos] [N/A:N/A] Wound Location: [5:Right, Medial Lower Leg] [7:Right Lower Leg - Proximal] [N/A:N/A] Wounding Event: [5:Gradually Appeared] [7:Blister] [N/A:N/A] Primary Etiology: [5:Venous Leg Ulcer] [7:Venous Leg Ulcer] [N/A:N/A] Comorbid History: [5:N/A] [7:Cataracts, Anemia, Hypertension, Osteoarthritis] [N/A:N/A] Date Acquired: [5:11/13/2014] [7:11/11/2015] [N/A:N/A]  Weeks of Treatment: [5:47] [7:4] [N/A:N/A] Wound Status: [5:Open] [7:Healed - Epithelialized] [N/A:N/A] Measurements L x W x D 1x0.5x0.1 [7:0x0x0] [N/A:N/A] (cm) Area (cm) : [5:0.393] [7:0] [N/A:N/A] Volume (cm) : [5:0.039] [7:0] [N/A:N/A] % Reduction in Area: [5:99.60%] [7:100.00%] [N/A:N/A] % Reduction in Volume: 99.90% [7:100.00%] [N/A:N/A] Classification: [5:Full Thickness Without Exposed Support Structures] [7:Partial Thickness] [N/A:N/A] Exudate Amount: [5:N/A] [7:Medium] [N/A:N/A] Exudate Type: [5:N/A] [7:Serosanguineous] [N/A:N/A] Exudate Color: [5:N/A] [7:red, brown] [N/A:N/A] Wound Margin: [5:N/A] [7:Indistinct, nonvisible] [N/A:N/A] Granulation Amount: [5:N/A] [7:Large (67-100%)] [N/A:N/A] Necrotic Amount: [5:N/A] [7:None Present (0%)] [N/A:N/A] Epithelialization: [5:N/A] [7:None] [N/A:N/A] Periwound Skin Texture: No Abnormalities Noted [7:Edema: No Excoriation: No Induration: No] [N/A:N/A] Callus: No Crepitus: No Fluctuance: No Friable: No Rash: No Scarring:  No Periwound Skin No Abnormalities Noted Moist: Yes N/A Moisture: Dry/Scaly: Yes Maceration: No Periwound Skin Color: No Abnormalities Noted Atrophie Blanche: No N/A Cyanosis: No Ecchymosis: No Erythema: No Hemosiderin Staining: No Mottled: No Pallor: No Rubor: No Temperature: N/A No Abnormality N/A Tenderness on No No N/A Palpation: Wound Preparation: N/A Ulcer Cleansing: Other: N/A soap and water Topical Anesthetic Applied: None Treatment Notes Electronic Signature(s) Signed: 12/13/2015 4:33:28 PM By: Elliot Gurney, RN, BSN, Kim RN, BSN Entered By: Elliot Gurney, RN, BSN, Kim on 12/12/2015 10:22:48 [name] ([id number]) -------------------------------------------------------------------------------- Multi-Disciplinary Care Plan Details Patient Name: KENGO, STURGES. Date of Service: 12/12/2015 10:00 AM Medical Record Number: [id number] Patient Account Number: [id number] Date of Birth/Sex: 19-Sep-1935 (80 y.o. Male) Treating RN: Huel Coventry Primary Care Physician: Dorothey Baseman Other Clinician: Referring Physician: Dorothey Baseman Treating Physician/Extender: Rudene Re in Treatment: 55 Active Inactive Orientation to the Wound Care Program Nursing Diagnoses: Knowledge deficit related to the wound healing center program Goals: Patient/caregiver will verbalize understanding of the Wound Healing Center Program Date Initiated: 01/15/2015 Goal Status: Active Interventions: Provide education on orientation to the wound center Notes: Venous Leg Ulcer Nursing Diagnoses: Knowledge deficit related to disease process and management Potential for venous Insuffiency (use before diagnosis confirmed) Goals: Patient will maintain optimal edema control Date Initiated: 01/15/2015 Goal Status: Active Patient/caregiver will verbalize understanding of disease process and disease management Date Initiated: 01/15/2015 Goal Status: Active Verify adequate tissue perfusion prior to  therapeutic compression application Date Initiated: 01/15/2015 Goal Status: Active Interventions: Assess peripheral edema status every visit. Compression as ordered Provide education on venous insufficiency [name], [name] ([id number]) Treatment Activities: Test ordered outside of clinic : 10/24/2015 Therapeutic compression applied : 10/24/2015 Venous Duplex Doppler : 10/24/2015 Notes: Wound/Skin Impairment Nursing Diagnoses: Impaired tissue integrity Knowledge deficit related to ulceration/compromised skin integrity Goals: Patient/caregiver will verbalize understanding of skin care regimen Date Initiated: 01/15/2015 Goal Status: Active Ulcer/skin breakdown will have a volume reduction of 30% by week 4 Date Initiated: 01/15/2015 Goal Status: Active Ulcer/skin breakdown will have a volume reduction of 50% by week 8 Date Initiated: 01/15/2015 Goal Status: Active Ulcer/skin breakdown will have a volume reduction of 80% by week 12 Date Initiated: 01/15/2015 Goal Status: Active Ulcer/skin breakdown will heal within 14 weeks Date Initiated: 01/15/2015 Goal Status: Active Interventions: Assess patient/caregiver ability to perform ulcer/skin care regimen upon admission and as needed Assess ulceration(s) every visit Provide education on ulcer and skin care Notes: Electronic Signature(s) Signed: 12/13/2015 4:33:28 PM By: Elliot Gurney, RN, BSN, Kim RN, BSN Entered By: Elliot Gurney, RN, BSN, Kim on 12/12/2015 10:22:38 [name], [name] ([id number]) -------------------------------------------------------------------------------- Pain Assessment Details Patient Name: [name], [name] Date of Service: 12/12/2015 10:00 AM Medical Record Number: [id number] Patient Account Number: [id number] Date of Birth/Sex: Sep 28, 1935 (80 y.o. Male) Treating RN:  Huel Coventry Primary Care Physician: Terance Hart, DAVID Other Clinician: Referring Physician: Dorothey Baseman Treating Physician/Extender: Rudene Re in  Treatment: 63 Active Problems Location of Pain Severity and Description of Pain Patient Has Paino No Site Locations With Dressing Change: No Pain Management and Medication Current Pain Management: Electronic Signature(s) Signed: 12/13/2015 4:33:28 PM By: Elliot Gurney, RN, BSN, Kim RN, BSN Entered By: Elliot Gurney, RN, BSN, Kim on 12/12/2015 09:54:48 [name] ([id number]) -------------------------------------------------------------------------------- Patient/Caregiver Education Details Patient Name: [name], [name] Date of Service: 12/12/2015 10:00 AM Medical Record Number: [id number] Patient Account Number: [id number] Date of Birth/Gender: 07/15/1935 (80 y.o. Male) Treating RN: Huel Coventry Primary Care Physician: Terance Hart, DAVID Other Clinician: Referring Physician: Dorothey Baseman Treating Physician/Extender: Rudene Re in Treatment: 71 Education Assessment Education Provided To: Patient Education Topics Provided Wound/Skin Impairment: Handouts: Other: countine wound care as prescribed Methods: Demonstration, Explain/Verbal Responses: State content correctly Electronic Signature(s) Signed: 12/13/2015 4:33:28 PM By: Elliot Gurney, RN, BSN, Kim RN, BSN Entered By: Elliot Gurney, RN, BSN, Kim on 12/12/2015 10:25:46 [name] ([id number]) -------------------------------------------------------------------------------- Wound Assessment Details Patient Name: [name], [name] Date of Service: 12/12/2015 10:00 AM Medical Record Number: [id number] Patient Account Number: [id number] Date of Birth/Sex: 1935/10/21 (80 y.o. Male) Treating RN: Huel Coventry Primary Care Physician: Terance Hart, DAVID Other Clinician: Referring Physician: Terance Hart, DAVID Treating Physician/Extender: Rudene Re in Treatment: 47 Wound Status Wound Number: 5 Primary Etiology: Venous Leg Ulcer Wound Location: Right, Medial Lower Leg Wound Status: Open Wounding Event: Gradually Appeared Date Acquired:  11/13/2014 Weeks Of Treatment: 47 Clustered Wound: No Photos Photo Uploaded By: Elliot Gurney, RN, BSN, Kim on 12/12/2015 10:26:30 Wound Measurements Length: (cm) 1 Width: (cm) 0.5 Depth: (cm) 0.1 Area: (cm) 0.393 Volume: (cm) 0.039 % Reduction in Area: 99.6% % Reduction in Volume: 99.9% Wound Description Full Thickness Without Exposed Classification: Support Structures Periwound Skin Texture Texture Color No Abnormalities Noted: No No Abnormalities Noted: No Moisture No Abnormalities Noted: No Treatment Notes Wound #5 (Right, Medial Lower Leg) [name], [name] ([id number]) 1. Cleansed with: Cleanse wound with antibacterial soap and water 4. Dressing Applied: Other dressing (specify in notes) 5. Secondary Dressing Applied ABD Pad 7. Secured with 4-Layer Compression System - Right Lower Extremity Notes RTD Electronic Signature(s) Signed: 12/13/2015 4:33:28 PM By: Elliot Gurney, RN, BSN, Kim RN, BSN Entered By: Elliot Gurney, RN, BSN, Kim on 12/12/2015 10:19:01 HEVER, MISHOE ([id number]) -------------------------------------------------------------------------------- Wound Assessment Details Patient Name: SHAMEER, MUSIC. Date of Service: 12/12/2015 10:00 AM Medical Record Number: [id number] Patient Account Number: [id number] Date of Birth/Sex: [birth year] (80 y.o. Male) Treating RN: Huel Coventry Primary Care Physician: Terance Hart, DAVID Other Clinician: Referring Physician: Terance Hart, DAVID Treating Physician/Extender: Rudene Re in Treatment: 47 Wound Status Wound Number: 7 Primary Venous Leg Ulcer Etiology: Wound Location: Right Lower Leg - Proximal Wound Status: Healed - Epithelialized Wounding Event: Blister Comorbid Cataracts, Anemia, Hypertension, Date Acquired: 11/11/2015 History: Osteoarthritis Weeks Of Treatment: 4 Clustered Wound: No Photos Photo Uploaded By: Elliot Gurney, RN, BSN, Kim on 12/12/2015 10:26:30 Wound Measurements Length: (cm) 0 % Reduction i Width:  (cm) 0 % Reduction i Depth: (cm) 0 Epithelializa Area: (cm) 0 Volume: (cm) 0 n Area: 100% n Volume: 100% tion: None Wound Description Classification: Partial Thickness Wound Margin: Indistinct, nonvisible Exudate Amount: Medium Exudate Type: Serosanguineous Exudate Color: red, brown Wound Bed Granulation Amount: Large (67-100%) Exposed Structure Necrotic Amount: None Present (0%) Fascia Exposed: No Fat Layer Exposed: No Tendon Exposed: No SHAFER, GUERNSEY ([id number]) Muscle Exposed: No Joint Exposed: No Bone Exposed: No Limited to  Skin Breakdown Periwound Skin Texture Texture Color No Abnormalities Noted: No No Abnormalities Noted: No Callus: No Atrophie Blanche: No Crepitus: No Cyanosis: No Excoriation: No Ecchymosis: No Fluctuance: No Erythema: No Friable: No Hemosiderin Staining: No Induration: No Mottled: No Localized Edema: No Pallor: No Rash: No Rubor: No Scarring: No Temperature / Pain Moisture Temperature: No Abnormality No Abnormalities Noted: No Dry / Scaly: Yes Maceration: No Moist: Yes Wound Preparation Ulcer Cleansing: Other: soap and water, Topical Anesthetic Applied: None Electronic Signature(s) Signed: 12/13/2015 4:33:28 PM By: Elliot Gurney, RN, BSN, Kim RN, BSN Entered By: Elliot Gurney, RN, BSN, Kim on 12/12/2015 10:22:29 OLLIS, DAUDELIN ([id number]) -------------------------------------------------------------------------------- Vitals Details Patient Name: [name] Date of Service: 12/12/2015 10:00 AM Medical Record Number: [id number] Patient Account Number: [id number] Date of Birth/Sex: January 24, 1936 (80 y.o. Male) Treating RN: Huel Coventry Primary Care Physician: Terance Hart, DAVID Other Clinician: Referring Physician: Terance Hart, DAVID Treating Physician/Extender: Rudene Re in Treatment: 41 Vital Signs Time Taken: 09:54 Temperature (F): 98.2 Height (in): 68 Pulse (bpm): 62 Respiratory Rate (breaths/min): 18 Blood  Pressure (mmHg): 97/55 Reference Range: 80 - 120 mg / dl Electronic Signature(s) Signed: 12/13/2015 4:33:28 PM By: Elliot Gurney, RN, BSN, Kim RN, BSN Entered By: Elliot Gurney, RN, BSN, Kim on 12/12/2015 09:56:25

## 2015-12-14 NOTE — Progress Notes (Signed)
[name], [name] ([id number]) Visit Report for 12/12/2015 Chief Complaint Document Details Patient Name: [name], [name] Date of Service: 12/12/2015 10:00 AM Medical Record Number: [id number] Patient Account Number: [id number] Date of Birth/Sex: 05-20-1936 (80 y.o. Male) Treating RN: Huel Coventry Primary Care Physician: Dorothey Baseman Other Clinician: Referring Physician: Dorothey Baseman Treating Physician/Extender: Rudene Re in Treatment: 75 Information Obtained from: Patient Chief Complaint Patient presents for treatment of an open ulcer due to venous insufficiency. The patient has had a open wound on his right medial ankle for at least 3 years and was last seen in the wound clinic in February 2015. He was lost to follow-up after that. Electronic Signature(s) Signed: 12/12/2015 10:40:54 AM By: Evlyn Kanner MD, FACS Entered By: Evlyn Kanner on 12/12/2015 10:40:54 [name] ([id number]) -------------------------------------------------------------------------------- HPI Details Patient Name: [name], [name] Date of Service: 12/12/2015 10:00 AM Medical Record Number: [id number] Patient Account Number: [id number] Date of Birth/Sex: 1935-10-21 (80 y.o. Male) Treating RN: Huel Coventry Primary Care Physician: Dorothey Baseman Other Clinician: Referring Physician: Dorothey Baseman Treating Physician/Extender: Rudene Re in Treatment: 69 History of Present Illness Location: right lower extremity ulceration Quality: Patient reports experiencing a dull pain to affected area(s). Severity: Patient states wound are getting worse. 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 Modifying Factors: Consults to this date include:has received treatment in the wound center in the past but stopped coming here since February 2015 Associated Signs and Symptoms: Patient reports having difficulty standing for long periods. HPI Description: 80 year old male who is known to have progressive weakness and has been in a nursing home for a while has had chronic lower extremity edema both legs and a large open wound on the right lower extremity which is has at least for about 3-4 years. The patient was seen in the wound clinic before and has been treated until February 2015 when he was lost to follow-up. Past medical history is significant for hypertension, gout, venous stasis ulcers, Parkinson's [name], constipation. He's not been a diabetic but his last hemoglobin A1c was 6 in March 2015. There is documentation that he's had endovenous ablation of bilateral varicose veins but we have no documentation about this and this may be done at least 4-5 years ago. 01/22/2015 -- he has his vascular test is scheduled for this afternoon. 01/29/2015 -- the patient's vascular test was canceled last week because he was unable to get onto the examining bed at [facility]. His Unna's boot was not applied either and the patient had a dressing placed by home health. Today when his dressing was removed we found a lot of maggots in his right lower extremity. 02/07/2015 -- Was seen in the vascular office by the PA and she noted that a bilateral venous duplex study did not show any DVT, SVT or reflux bilaterally. The patient was advised to continue with Unna's boot and would at some stage to require graduated compression stockings of the 20-30 mmHg variety. In the future lymphedema pumps would also be considered. 02/14/2015 -- his dressing is smelling quite a bit and there is a discoloration of the wound suggestive of an infection. Deep tissue cultures will be taken today. 02/21/2015 --  the culture  is back and it has growth moderate growth of Proteus and gram-negative rods sensitive to sulfa, ampicillin, cephazolin, Zosyn. He is already on doxycycline and his wound is looking clean and hence we will continue with this. 02/28/2015 -- he's been doing fine still on his antibiotics and has no fresh issues. 03/07/2015 -- I was told that because he lives in a nursing home the Apligraf was denied by his insurance company. 03/14/2015 -- we are awaiting samples and a trial of Puraply to be tried for his ulceration. 03/21/2015 -- his Apligraf was approved and he is here for his first application of Apligraf 03/28/2015 -- his wound has had quite a bit of secretion and needs to be changed and hence I will use the MOSE, COLAIZZI. ([id number]) second application of Apligraf. 04/04/2015 -- he is here for a wound check but has a lot of secretions and hence his dressing needs to be taken down. 04/09/2015 -- he is here for his third application of Apligraf 04/18/2015 -- he is here for a wound check and though he does not have any overt infection he always has a foul odor to his leg. 04/25/2015 -- his wound has been doing much better and he has minimal drainage and the size is smaller. Of note he is on doxycycline. 05/02/2015 -- he is here for his fourth application of Apligraf 05/09/2015 -- he is here for a wound check and is bothered by a lot of odor from the wound. 05/16/2015 -- he has done very well, the odor and drainage is less with the wound dressing having been changed twice this week. He is here for his last application of Apligraf. 06/20/2015 -- an area superior to the previous wound has now opened up and this is a fairly superficial ulceration. 07/12/2015 -- he is here for his first application of Puraply. This is a sample and this is no charge. 07/18/2015 -- he is here for his second application of Puraply. This is a sample and this is no charge. 07/25/2015 -- he is here for his third  application of Puraply. This is a sample and this is no charge. 08/01/2015 -- he is here for his fourth application of Puraply. This is a sample and this is no charge. 08/08/2015 -- he is here for his fifth application of Puraply and this is a sample at no charge. 08/15/2015 -- he is here for his sixth application of Puraply and this is a sample at no charge. 08/29/2015 -- he is here for his eight application of Puraply and this is a sample at no charge. 09/05/2015 -- he is here for his nineth application of Puraply and this is a sample at no charge. 09/19/2015 -- this is the second week of using the collagen substitute and he is doing very well. 09/26/2015 -- this is the third week of using the collagen substitute. 10/03/2015 -- this is the fourth week of using the collagen substitute. 10/24/2015 --this is the seventh week of using the collagen substitute. 11/07/2015 -- last week he had not used the collagen substitute but today we have applied the eighth collagen substitute on his wound. 11/14/2015 -- he continues to have 2 small areas which are open and we will use the ninth application of the collagen substitute 11/21/2015 -- may continue to use the collagen substitute as he has tiny area still open. We have been trying to work with his home health or his nursing home regarding getting this from the  vendor Electronic Signature(s) Signed: 12/12/2015 10:41:00 AM By: Evlyn Kanner MD, FACS Entered By: Evlyn Kanner on 12/12/2015 10:40:59 [name], [name] ([id number]) -------------------------------------------------------------------------------- Physical Exam Details Patient Name: MAXIMILIANO, CROMARTIE. Date of Service: 12/12/2015 10:00 AM Medical Record Number: [id number] Patient Account Number: [id number] Date of Birth/Sex: 05-Jan-1936 (80 y.o. Male) Treating RN: Huel Coventry Primary Care Physician: Dorothey Baseman Other Clinician: Referring Physician: Dorothey Baseman Treating  Physician/Extender: Rudene Re in Treatment: 47 Constitutional . Pulse regular. Respirations normal and unlabored. Afebrile. . Eyes Nonicteric. Reactive to light. Ears, Nose, Mouth, and Throat Lips, teeth, and gums WNL.Marland Kitchen Moist mucosa without lesions. Neck supple and nontender. No palpable supraclavicular or cervical adenopathy. Normal sized without goiter. Respiratory WNL. No retractions.. Cardiovascular Pedal Pulses WNL. No clubbing, cyanosis or edema. Lymphatic No adneopathy. No adenopathy. No adenopathy. Musculoskeletal Adexa without tenderness or enlargement.. Digits and nails w/o clubbing, cyanosis, infection, petechiae, ischemia, or inflammatory conditions.. Integumentary (Hair, Skin) No suspicious lesions. No crepitus or fluctuance. No peri-wound warmth or erythema. No masses.Marland Kitchen Psychiatric Judgement and insight Intact.. No evidence of depression, anxiety, or agitation.. Notes the lymphedema still down and the wound is looking excellent with a few micro-ulcerations but the rest of it is completely healed. Electronic Signature(s) Signed: 12/12/2015 10:41:45 AM By: Evlyn Kanner MD, FACS Entered By: Evlyn Kanner on 12/12/2015 10:41:45 [name], [name] ([id number]) -------------------------------------------------------------------------------- Physician Orders Details Patient Name: [name], [name] Date of Service: 12/12/2015 10:00 AM Medical Record Number: [id number] Patient Account Number: [id number] Date of Birth/Sex: 1935/09/18 (80 y.o. Male) Treating RN: Huel Coventry Primary Care Physician: Dorothey Baseman Other Clinician: Referring Physician: Dorothey Baseman Treating Physician/Extender: Rudene Re in Treatment: 70 Verbal / Phone Orders: Yes Clinician: Huel Coventry Read Back and Verified: Yes Diagnosis Coding Wound Cleansing Wound #5 Right,Medial Lower Leg o Cleanse wound with mild soap and water o May shower with protection. o No tub  bath. Skin Barriers/Peri-Wound Care Wound #5 Right,Medial Lower Leg o Barrier cream o Moisturizing lotion Primary Wound Dressing Wound #5 Right,Medial Lower Leg o Other: - RTD Secondary Dressing Wound #5 Right,Medial Lower Leg o ABD pad Dressing Change Frequency Wound #5 Right,Medial Lower Leg o Change dressing every week Follow-up Appointments Wound #5 Right,Medial Lower Leg o Return Appointment in 1 week. Edema Control Wound #5 Right,Medial Lower Leg o 4-Layer Compression System - Right Lower Extremity - Bring compression stockings to next visit o Elevate legs to the level of the heart and pump ankles as often as possible Additional Orders / Instructions o Other: - Lymphedema pumps [name], [name] ([id number]) Electronic Signature(s) Signed: 12/12/2015 3:49:49 PM By: Evlyn Kanner MD, FACS Signed: 12/13/2015 4:33:28 PM By: Elliot Gurney RN, BSN, Kim RN, BSN Entered By: Elliot Gurney, RN, BSN, Kim on 12/12/2015 10:24:21 [name], [name] ([id number]) -------------------------------------------------------------------------------- Problem List Details Patient Name: [name], [name] Date of Service: 12/12/2015 10:00 AM Medical Record Number: [id number] Patient Account Number: [id number] Date of Birth/Sex: 01-07-1936 (80 y.o. 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 Date of Service: 12/12/2015 10:00 AM Medical Record Number: [id number] Patient Account Number: [id number] Date of Birth/Sex: 1936-04-30 (81 y.o. Male) Treating RN: Huel Coventry Primary Care Physician: Dorothey Baseman Other Clinician: Referring Physician: Dorothey Baseman Treating Physician/Extender: Rudene Re in Treatment: 81 Subjective Chief Complaint Information obtained from Patient Patient presents for treatment of an open ulcer due to venous insufficiency. The patient has had a open wound on his right medial ankle for at least 3 years and was last seen in the wound clinic in February 2015. He was lost to follow-up after that. History of Present Illness (HPI) The following HPI elements were documented for the patient's wound: Location: right lower extremity ulceration Quality: Patient reports experiencing a dull pain to affected area(s). Severity: Patient states wound are getting worse. 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 Modifying Factors: Consults to this date include:has received treatment in the wound center in the past but stopped coming here since February 2015 Associated Signs and Symptoms: Patient reports having difficulty standing for long periods. 80 year old male who is known to have progressive weakness and has been in a nursing home for a while has had chronic lower extremity edema both legs and a large open wound on  the right lower extremity which is has at least for about 3-4 years. The patient was seen in the wound clinic before and has been treated until February 2015 when he was lost to follow-up. Past medical history is significant for hypertension, gout, venous stasis ulcers, Parkinson's [name], constipation. He's not been a diabetic but his last hemoglobin A1c was 6 in March 2015. There is documentation that he's had endovenous ablation of bilateral varicose veins but we have no documentation about this and this may be done at least 4-5 years ago. 01/22/2015 -- he has his vascular test is scheduled for this afternoon. 01/29/2015 -- the patient's vascular test was canceled last week because he was unable to get onto the examining bed at [facility]. His Unna's boot was not applied either and the patient had a dressing placed by home health. Today when his dressing was removed we found a lot of maggots in his right lower extremity. 02/07/2015 -- Was seen in the vascular office by the PA and she noted that a bilateral venous duplex study did not show any DVT, SVT or reflux bilaterally. The patient was advised to continue with Unna's boot and would at some stage to require graduated compression stockings of the 20-30 mmHg variety. In the future lymphedema pumps would also be considered. 02/14/2015 -- his dressing is smelling quite a bit and there is a discoloration of the wound suggestive of an [name], [name] ([id number]) infection. Deep tissue cultures will be taken today. 02/21/2015 -- the culture is back and it has growth moderate growth of Proteus and gram-negative rods sensitive to sulfa, ampicillin, cephazolin, Zosyn. He is already on doxycycline and his wound is looking clean and hence we will continue with this. 02/28/2015 -- he's been doing fine still on his antibiotics and has no fresh issues. 03/07/2015 -- I was told that because he  lives in a nursing home the Apligraf was denied by his  insurance company. 03/14/2015 -- we are awaiting samples and a trial of Puraply to be tried for his ulceration. 03/21/2015 -- his Apligraf was approved and he is here for his first application of Apligraf 03/28/2015 -- his wound has had quite a bit of secretion and needs to be changed and hence I will use the second application of Apligraf. 04/04/2015 -- he is here for a wound check but has a lot of secretions and hence his dressing needs to be taken down. 04/09/2015 -- he is here for his third application of Apligraf 04/18/2015 -- he is here for a wound check and though he does not have any overt infection he always has a foul odor to his leg. 04/25/2015 -- his wound has been doing much better and he has minimal drainage and the size is smaller. Of note he is on doxycycline. 05/02/2015 -- he is here for his fourth application of Apligraf 05/09/2015 -- he is here for a wound check and is bothered by a lot of odor from the wound. 05/16/2015 -- he has done very well, the odor and drainage is less with the wound dressing having been changed twice this week. He is here for his last application of Apligraf. 06/20/2015 -- an area superior to the previous wound has now opened up and this is a fairly superficial ulceration. 07/12/2015 -- he is here for his first application of Puraply. This is a sample and this is no charge. 07/18/2015 -- he is here for his second application of Puraply. This is a sample and this is no charge. 07/25/2015 -- he is here for his third application of Puraply. This is a sample and this is no charge. 08/01/2015 -- he is here for his fourth application of Puraply. This is a sample and this is no charge. 08/08/2015 -- he is here for his fifth application of Puraply and this is a sample at no charge. 08/15/2015 -- he is here for his sixth application of Puraply and this is a sample at no charge. 08/29/2015 -- he is here for his eight application of Puraply and this is a  sample at no charge. 09/05/2015 -- he is here for his nineth application of Puraply and this is a sample at no charge. 09/19/2015 -- this is the second week of using the collagen substitute and he is doing very well. 09/26/2015 -- this is the third week of using the collagen substitute. 10/03/2015 -- this is the fourth week of using the collagen substitute. 10/24/2015 --this is the seventh week of using the collagen substitute. 11/07/2015 -- last week he had not used the collagen substitute but today we have applied the eighth collagen substitute on his wound. 11/14/2015 -- he continues to have 2 small areas which are open and we will use the ninth application of the collagen substitute 11/21/2015 -- may continue to use the collagen substitute as he has tiny area still open. We have been trying to work with his home health or his nursing home regarding getting this from the vendor [employer] [name], [name] ([id number]) Constitutional Pulse regular. Respirations normal and unlabored. Afebrile. Vitals Time Taken: 9:54 AM, Height: 68 in, Temperature: 98.2 F, Pulse: 62 bpm, Respiratory Rate: 18 breaths/min, Blood Pressure: 97/55 mmHg. Eyes Nonicteric. Reactive to light. Ears, Nose, Mouth, and Throat Lips, teeth, and gums WNL.Marland Kitchen Moist mucosa without lesions. Neck supple and nontender. No palpable supraclavicular or cervical adenopathy. Normal  sized without goiter. Respiratory WNL. No retractions.. Cardiovascular Pedal Pulses WNL. No clubbing, cyanosis or edema. Lymphatic No adneopathy. No adenopathy. No adenopathy. Musculoskeletal Adexa without tenderness or enlargement.. Digits and nails w/o clubbing, cyanosis, infection, petechiae, ischemia, or inflammatory conditions.Marland Kitchen Psychiatric Judgement and insight Intact.. No evidence of depression, anxiety, or agitation.. General Notes: the lymphedema still down and the wound is looking excellent with a few micro-ulcerations but the rest of  it is completely healed. Integumentary (Hair, Skin) No suspicious lesions. No crepitus or fluctuance. No peri-wound warmth or erythema. No masses.. Wound #5 status is Open. Original cause of wound was Gradually Appeared. The wound is located on the Right,Medial Lower Leg. The wound measures 1cm length x 0.5cm width x 0.1cm depth; 0.393cm^2 area and 0.039cm^3 volume. Wound #7 status is Healed - Epithelialized. Original cause of wound was Blister. The wound is located on the Right,Proximal Lower Leg. The wound measures 0cm length x 0cm width x 0cm depth; 0cm^2 area and 0cm^3 volume. The wound is limited to skin breakdown. There is a medium amount of serosanguineous drainage noted. The wound margin is indistinct and nonvisible. There is large (67-100%) granulation within the wound bed. There is no necrotic tissue within the wound bed. The periwound skin appearance exhibited: Dry/Scaly, Moist. The periwound skin appearance did not exhibit: Callus, Crepitus, Excoriation, AVIRAJ, KENTNER. ([id number]) Fluctuance, Friable, Induration, Localized Edema, Rash, Scarring, Maceration, Atrophie Blanche, Cyanosis, Ecchymosis, Hemosiderin Staining, Mottled, Pallor, Rubor, Erythema. Periwound temperature was noted as No Abnormality. 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 No tub bath. Skin Barriers/Peri-Wound Care: Wound #5 Right,Medial Lower Leg: Barrier cream Moisturizing lotion Primary Wound Dressing: Wound #5 Right,Medial Lower Leg: Other: - RTD Secondary Dressing: Wound #5 Right,Medial Lower Leg: ABD pad Dressing  Change Frequency: Wound #5 Right,Medial Lower Leg: Change dressing every week Follow-up Appointments: Wound #5 Right,Medial Lower Leg: Return Appointment in 1 week. Edema Control: Wound #5 Right,Medial Lower Leg: 4-Layer Compression System - Right Lower Extremity - Bring compression stockings to next visit Elevate legs to the level of the heart and pump ankles as often as possible Additional Orders / Instructions: WINTON, OFFORD ([id number]) Other: - Lymphedema pumps I have recommended some RTD over his wound and we will apply some ABD and then compress him with a 3-layer Profore. We have heard back from his nursing home and they spoke to our nurses here, and stated that they will not be able to use lymphedema pumps at the nursing home. I may need to speak with the nursing supervisor at this place or the director of the nursing home. I have asked him to bring his compression stockings the next week when he is here Electronic Signature(s) Signed: 12/12/2015 10:42:18 AM By: Evlyn Kanner MD, FACS Entered By: Evlyn Kanner on 12/12/2015 10:42:18 DVANTE, HANDS ([id number]) -------------------------------------------------------------------------------- SuperBill Details Patient Name: [name]. Date of Service: 12/12/2015 Medical Record Number: [id number] Patient Account Number: [id number] Date of Birth/Sex: 12-02-1935 (80 y.o. 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 E66.01 Morbid (severe) obesity due to excess calories I89.0 Lymphedema, not elsewhere classified Facility Procedures CPT4: Description Modifier Quantity  Code [id number] (Facility Use Only) [phone] - APPLY MULTLAY COMPRS LWR RT 1 LEG Physician Procedures CPT4 Code Description: [id number] - WC PHYS LEVEL 3 - EST PT ICD-10 Description Diagnosis I87.311 Chronic venous hypertension (idiopathic) with ulcer of 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 E66.01 Morbid (severe) obesity due  to excess calories I89.0 Lymphedema, not elsewhere classified Modifier: right lower s of muscle Quantity: 1 extremity Electronic Signature(s) Signed: 12/12/2015 3:49:49 PM By: Evlyn Kanner MD, FACS Signed: 12/13/2015 4:33:28 PM By: Elliot Gurney RN, BSN, Kim RN, BSN Previous Signature: 12/12/2015 10:42:33 AM Version By: Evlyn Kanner MD, FACS Entered By: Elliot Gurney, RN, BSN, Kim on 12/12/2015 11:44:49

## 2015-12-19 ENCOUNTER — Encounter: Payer: Medicare Other | Admitting: Surgery

## 2015-12-19 DIAGNOSIS — I87311 Chronic venous hypertension (idiopathic) with ulcer of right lower extremity: Secondary | ICD-10-CM | POA: Diagnosis not present

## 2015-12-19 NOTE — Progress Notes (Signed)
[name], [name] ([id number]) Visit Report for 12/19/2015 Chief Complaint Document Details Patient Name: [name], [name] Date of Service: 12/19/2015 10:45 AM Medical Record Number: [id number] Patient Account Number: [id number] Date of Birth/Sex: [birth year] (80 y.o. Male) Treating RN: Huel Coventry Primary Care Physician: Dorothey Baseman Other Clinician: Referring Physician: Dorothey Baseman Treating Physician/Extender: Rudene Re in Treatment: 32 Information Obtained from: Patient Chief Complaint Patient presents for treatment of an open ulcer due to venous insufficiency. The patient has had a open wound on his right medial ankle for at least 3 years and was last seen in the wound clinic in February 2015. He was lost to follow-up after that. Electronic Signature(s) Signed: 12/19/2015 11:30:34 AM By: Evlyn Kanner MD, FACS Entered By: Evlyn Kanner on 12/19/2015 11:30:34 VEER, ELAMIN ([id number]) -------------------------------------------------------------------------------- HPI Details Patient Name: [name], [name] Date of Service: 12/19/2015 10:45 AM Medical Record Number: [id number] Patient Account Number: [id number] Date of Birth/Sex: June 12, 1936 (80 y.o. Male) Treating RN: Huel Coventry Primary Care Physician: Dorothey Baseman Other Clinician: Referring Physician: Dorothey Baseman Treating Physician/Extender: Rudene Re in Treatment: 48 History of Present Illness Location: right lower extremity ulceration Quality: Patient reports experiencing a dull pain to affected area(s). Severity: Patient states wound are getting worse. 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 Modifying Factors: Consults to this date include:has received treatment in the wound center in the past but stopped coming here since February 2015 Associated Signs and Symptoms: Patient reports having difficulty standing for long periods. HPI Description: 80 year old male who is known to have progressive weakness and has been in a nursing home for a while has had chronic lower extremity edema both legs and a large open wound on the right lower extremity which is has at least for about 3-4 years. The patient was seen in the wound clinic before and has been treated until February 2015 when he was lost to follow-up. Past medical history is significant for hypertension, gout, venous stasis ulcers, Parkinson's [name], constipation. He's not been a diabetic but his last hemoglobin A1c was 6 in March 2015. There is documentation that he's had endovenous ablation of bilateral varicose veins but we have no documentation about this and this may be done at least 4-5 years ago. 01/22/2015 -- he has his vascular test is scheduled for this afternoon. 01/29/2015 -- the patient's vascular test was canceled last week because he was unable to get onto the examining bed at [facility]. His Unna's boot was not applied either and the patient had a dressing placed by home health. Today when his dressing was removed we found a lot of maggots in his right lower extremity. 02/07/2015 -- Was seen in the vascular office by the PA and she noted that a bilateral venous duplex study did not show any DVT, SVT or reflux bilaterally. The patient was advised to continue with Unna's boot and would at some stage to require graduated compression stockings of the 20-30 mmHg variety. In the future lymphedema pumps would also be considered. 02/14/2015 -- his dressing is smelling quite a bit and there is a discoloration of the wound suggestive of an infection. Deep tissue cultures will be taken today. 02/21/2015 --  the culture  is back and it has growth moderate growth of Proteus and gram-negative rods sensitive to sulfa, ampicillin, cephazolin, Zosyn. He is already on doxycycline and his wound is looking clean and hence we will continue with this. 02/28/2015 -- he's been doing fine still on his antibiotics and has no fresh issues. 03/07/2015 -- I was told that because he lives in a nursing home the Apligraf was denied by his insurance company. 03/14/2015 -- we are awaiting samples and a trial of Puraply to be tried for his ulceration. 03/21/2015 -- his Apligraf was approved and he is here for his first application of Apligraf 03/28/2015 -- his wound has had quite a bit of secretion and needs to be changed and hence I will use the WARNER, LADUCA. ([id number]) second application of Apligraf. 04/04/2015 -- he is here for a wound check but has a lot of secretions and hence his dressing needs to be taken down. 04/09/2015 -- he is here for his third application of Apligraf 04/18/2015 -- he is here for a wound check and though he does not have any overt infection he always has a foul odor to his leg. 04/25/2015 -- his wound has been doing much better and he has minimal drainage and the size is smaller. Of note he is on doxycycline. 05/02/2015 -- he is here for his fourth application of Apligraf 05/09/2015 -- he is here for a wound check and is bothered by a lot of odor from the wound. 05/16/2015 -- he has done very well, the odor and drainage is less with the wound dressing having been changed twice this week. He is here for his last application of Apligraf. 06/20/2015 -- an area superior to the previous wound has now opened up and this is a fairly superficial ulceration. 07/12/2015 -- he is here for his first application of Puraply. This is a sample and this is no charge. 07/18/2015 -- he is here for his second application of Puraply. This is a sample and this is no charge. 07/25/2015 -- he is here for his third  application of Puraply. This is a sample and this is no charge. 08/01/2015 -- he is here for his fourth application of Puraply. This is a sample and this is no charge. 08/08/2015 -- he is here for his fifth application of Puraply and this is a sample at no charge. 08/15/2015 -- he is here for his sixth application of Puraply and this is a sample at no charge. 08/29/2015 -- he is here for his eight application of Puraply and this is a sample at no charge. 09/05/2015 -- he is here for his nineth application of Puraply and this is a sample at no charge. 09/19/2015 -- this is the second week of using the collagen substitute and he is doing very well. 09/26/2015 -- this is the third week of using the collagen substitute. 10/03/2015 -- this is the fourth week of using the collagen substitute. 10/24/2015 --this is the seventh week of using the collagen substitute. 11/07/2015 -- last week he had not used the collagen substitute but today we have applied the eighth collagen substitute on his wound. 11/14/2015 -- he continues to have 2 small areas which are open and we will use the ninth application of the collagen substitute 11/21/2015 -- may continue to use the collagen substitute as he has tiny area still open. We have been trying to work with his home health or his nursing home regarding getting this from the  vendor 12/19/2015 -- we have been in touch with the lymphedema pump vendor is an every possible avenue is going to be pursued to try and get him lymphedema pumps. Electronic Signature(s) Signed: 12/19/2015 11:31:10 AM By: Evlyn Kanner MD, FACS Entered By: Evlyn Kanner on 12/19/2015 11:31:09 [name], [name] ([id number]) -------------------------------------------------------------------------------- Physical Exam Details Patient Name: [name], [name] Date of Service: 12/19/2015 10:45 AM Medical Record Number: [id number] Patient Account Number: [id number] Date of Birth/Sex: 1936-06-26 (80  y.o. Male) Treating RN: Huel Coventry Primary Care Physician: Dorothey Baseman Other Clinician: Referring Physician: Dorothey Baseman Treating Physician/Extender: Rudene Re in Treatment: 48 Constitutional . Pulse regular. Respirations normal and unlabored. Afebrile. . Eyes Nonicteric. Reactive to light. Ears, Nose, Mouth, and Throat Lips, teeth, and gums WNL.Marland Kitchen Moist mucosa without lesions. Neck supple and nontender. No palpable supraclavicular or cervical adenopathy. Normal sized without goiter. Respiratory WNL. No retractions.. Cardiovascular Pedal Pulses WNL. No clubbing, cyanosis or edema. Lymphatic No adneopathy. No adenopathy. No adenopathy. Musculoskeletal Adexa without tenderness or enlargement.. Digits and nails w/o clubbing, cyanosis, infection, petechiae, ischemia, or inflammatory conditions.. Integumentary (Hair, Skin) No suspicious lesions. No crepitus or fluctuance. No peri-wound warmth or erythema. No masses.Marland Kitchen Psychiatric Judgement and insight Intact.. No evidence of depression, anxiety, or agitation.. Notes the wound has completely healed and there are no open ulcerations and the lymphedema is looking excellent. Electronic Signature(s) Signed: 12/19/2015 11:31:41 AM By: Evlyn Kanner MD, FACS Entered By: Evlyn Kanner on 12/19/2015 11:31:40 TOMOYA, RINGWALD ([id number]) -------------------------------------------------------------------------------- Physician Orders Details Patient Name: CHINEDU, AGUSTIN. Date of Service: 12/19/2015 10:45 AM Medical Record Number: [id number] Patient Account Number: [id number] Date of Birth/Sex: [birth year] (80 y.o. Male) Treating RN: Huel Coventry Primary Care Physician: Dorothey Baseman Other Clinician: Referring Physician: Dorothey Baseman Treating Physician/Extender: Rudene Re in Treatment: 61 Verbal / Phone Orders: Yes Clinician: Huel Coventry Read Back and Verified: Yes Diagnosis Coding Follow-up  Appointments o Other: - As needed. Edema Control o Patient to wear own compression stockings o Support Garment 20-30 mm/Hg pressure to: - right leg Additional Orders / Instructions o Other: - Lymphedema pumps 1 hour twice daily. Discharge From [facility] o Discharge from Wound Care Center Electronic Signature(s) Signed: 12/19/2015 11:40:57 AM By: Elliot Gurney RN, BSN, Kim RN, BSN Signed: 12/19/2015 3:21:55 PM By: Evlyn Kanner MD, FACS Entered By: Elliot Gurney RN, BSN, Kim on 12/19/2015 11:07:28 BRANDIN, STETZER ([id number]) -------------------------------------------------------------------------------- Problem List Details Patient Name: [name], [name] Date of Service: 12/19/2015 10:45 AM Medical Record Number: [id number] Patient Account Number: [id number] Date of Birth/Sex: 1935/08/10 (80 y.o. 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 Date of Service: 12/19/2015 10:45 AM Medical Record Number: [id number] Patient Account Number: [id number] Date of Birth/Sex: 05/29/1936 (80 y.o. Male) Treating RN: Huel Coventry Primary Care  Physician: Dorothey Baseman Other Clinician: Referring Physician: Dorothey Baseman Treating Physician/Extender: Rudene Re in Treatment: 16 Subjective Chief Complaint Information obtained from  Patient Patient presents for treatment of an open ulcer due to venous insufficiency. The patient has had a open wound on his right medial ankle for at least 3 years and was last seen in the wound clinic in February 2015. He was lost to follow-up after that. History of Present Illness (HPI) The following HPI elements were documented for the patient's wound: Location: right lower extremity ulceration Quality: Patient reports experiencing a dull pain to affected area(s). Severity: Patient states wound are getting worse. 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 Modifying Factors: Consults to this date include:has received treatment in the wound center in the past but stopped coming here since February 2015 Associated Signs and Symptoms: Patient reports having difficulty standing for long periods. 80 year old male who is known to have progressive weakness and has been in a nursing home for a while has had chronic lower extremity edema both legs and a large open wound on the right lower extremity which is has at least for about 3-4 years. The patient was seen in the wound clinic before and has been treated until February 2015 when he was lost to follow-up. Past medical history is significant for hypertension, gout, venous stasis ulcers, Parkinson's [name], constipation. He's not been a diabetic but his last hemoglobin A1c was 6 in March 2015. There is documentation that he's had endovenous ablation of  bilateral varicose veins but we have no documentation about this and this may be done at least 4-5 years ago. 01/22/2015 -- he has his vascular test is scheduled for this afternoon. 01/29/2015 -- the patient's vascular test was canceled last week because he was unable to get onto the examining bed at [facility]. His Unna's boot was not applied either and the patient had a dressing placed by home health. Today when his dressing was removed we found a lot of maggots in his right lower extremity. 02/07/2015 -- Was seen in the vascular office by the PA and she noted that a bilateral venous duplex study did not show any DVT, SVT or reflux bilaterally. The patient was advised to continue with Unna's boot and would at some stage to require graduated compression stockings of the 20-30 mmHg variety. In the future lymphedema pumps would also be considered. 02/14/2015 -- his dressing is smelling quite a bit and there is a discoloration of the wound suggestive of an LEODIS, ALCOCER. ([id number]) infection. Deep tissue cultures will be taken today. 02/21/2015 -- the culture is back and it has growth moderate growth of Proteus and gram-negative rods sensitive to sulfa, ampicillin, cephazolin, Zosyn. He is already on doxycycline and his wound is looking clean and hence we will continue with this. 02/28/2015 -- he's been doing fine still on his antibiotics and has no fresh issues. 03/07/2015 -- I was told that because he lives in a nursing home the Apligraf was denied by his insurance company. 03/14/2015 -- we are awaiting samples and a trial of Puraply to be tried for his ulceration. 03/21/2015 -- his Apligraf was approved and he is here for his first application of Apligraf 03/28/2015 -- his wound has had quite a bit of secretion and needs to be changed and hence  I will use the second application of Apligraf. 04/04/2015 -- he is here for a wound check but has a lot of secretions and hence his dressing needs to  be taken down. 04/09/2015 -- he is here for his third application of Apligraf 04/18/2015 -- he is here for a wound check and though he does not have any overt infection he always has a foul odor to his leg. 04/25/2015 -- his wound has been doing much better and he has minimal drainage and the size is smaller. Of note he is on doxycycline. 05/02/2015 -- he is here for his fourth application of Apligraf 05/09/2015 -- he is here for a wound check and is bothered by a lot of odor from the wound. 05/16/2015 -- he has done very well, the odor and drainage is less with the wound dressing having been changed twice this week. He is here for his last application of Apligraf. 06/20/2015 -- an area superior to the previous wound has now opened up and this is a fairly superficial ulceration. 07/12/2015 -- he is here for his first application of Puraply. This is a sample and this is no charge. 07/18/2015 -- he is here for his second application of Puraply. This is a sample and this is no charge. 07/25/2015 -- he is here for his third application of Puraply. This is a sample and this is no charge. 08/01/2015 -- he is here for his fourth application of Puraply. This is a sample and this is no charge. 08/08/2015 -- he is here for his fifth application of Puraply and this is a sample at no charge. 08/15/2015 -- he is here for his sixth application of Puraply and this is a sample at no charge. 08/29/2015 -- he is here for his eight application of Puraply and this is a sample at no charge. 09/05/2015 -- he is here for his nineth application of Puraply and this is a sample at no charge. 09/19/2015 -- this is the second week of using the collagen substitute and he is doing very well. 09/26/2015 -- this is the third week of using the collagen substitute. 10/03/2015 -- this is the fourth week of using the collagen substitute. 10/24/2015 --this is the seventh week of using the collagen substitute. 11/07/2015 --  last week he had not used the collagen substitute but today we have applied the eighth collagen substitute on his wound. 11/14/2015 -- he continues to have 2 small areas which are open and we will use the ninth application of the collagen substitute 11/21/2015 -- may continue to use the collagen substitute as he has tiny area still open. We have been trying to work with his home health or his nursing home regarding getting this from the vendor 12/19/2015 -- we have been in touch with the lymphedema pump vendor is an every possible avenue is going to be pursued to try and get him lymphedema pumps. [name], [name] ([id number]) Objective Constitutional Pulse regular. Respirations normal and unlabored. Afebrile. Vitals Time Taken: 10:47 AM, Height: 68 in, Temperature: 97.6 F, Pulse: 64 bpm, Respiratory Rate: 18 breaths/min, Blood Pressure: 130/61 mmHg. Eyes Nonicteric. Reactive to light. Ears, Nose, Mouth, and Throat Lips, teeth, and gums WNL.Marland Kitchen Moist mucosa without lesions. Neck supple and nontender. No palpable supraclavicular or cervical adenopathy. Normal sized without goiter. Respiratory WNL. No retractions.. Cardiovascular Pedal Pulses WNL. No clubbing, cyanosis or edema. Lymphatic No adneopathy. No adenopathy. No adenopathy. Musculoskeletal Adexa without tenderness or enlargement.. Digits and nails w/o clubbing, cyanosis,  infection, petechiae, ischemia, or inflammatory conditions.Marland Kitchen Psychiatric Judgement and insight Intact.. No evidence of depression, anxiety, or agitation.. General Notes: the wound has completely healed and there are no open ulcerations and the lymphedema is looking excellent. Integumentary (Hair, Skin) No suspicious lesions. No crepitus or fluctuance. No peri-wound warmth or erythema. No masses.. Wound #5 status is Healed - Epithelialized. Original cause of wound was Gradually Appeared. The wound is located on the Right,Medial Lower Leg. The wound measures  0cm length x 0cm width x 0cm depth; 0cm^2 area and 0cm^3 volume. There is large (67-100%) granulation within the wound bed. The periwound skin appearance exhibited: Scarring, Dry/Scaly. 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 We have also been working hard with the lymphedema pump windows and the nursing home to get this in place. I'm afraid without lymphedema pumps he is going to find it difficult to maintain the integrity of this heel wound and recurrence rate is going to be high. He is discharged on the wound care services today and will be seen back as needed Plan Follow-up Appointments: Other: - As needed. Edema Control: Patient to wear own compression stockings Support Garment 20-30 mm/Hg pressure to: - right leg Additional Orders / Instructions: Other: - Lymphedema pumps 1 hour twice daily. Discharge From [facility] Services: Discharge from Wound Care Center Having healed out the wound today I have recommended 20-30 mm compression stockings to be worn all day except at bedtime. We have also been working hard with the lymphedema pump windows and the nursing home to get this in place. HUE, FEEMAN ([id number]) I'm afraid without lymphedema pumps he is going to find it difficult to maintain the integrity of this heel wound and recurrence rate is going to be high. He is discharged on the wound care services today and will be seen back as needed Electronic Signature(s) Signed: 12/19/2015 11:32:50 AM By: Evlyn Kanner MD, FACS Entered By: Evlyn Kanner on 12/19/2015  11:32:49 [name], [name] ([id number]) -------------------------------------------------------------------------------- SuperBill Details Patient Name: [name]. Date of Service: 12/19/2015 Medical Record Number: [id number] Patient Account Number: [id number] Date of Birth/Sex: 10-Oct-1935 (80 y.o. 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 E66.01 Morbid (severe) obesity due to excess calories I89.0 Lymphedema, not elsewhere classified Facility Procedures CPT4 Code: [id number] Description: [phone] - WOUND CARE VISIT-LEV 2 EST PT Modifier: Quantity: 1 Physician Procedures CPT4 Code Description: [id number] - WC PHYS LEVEL 3 - EST PT ICD-10 Description Diagnosis I87.311 Chronic venous hypertension (idiopathic) with ulcer of 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 E66.01 Morbid (severe) obesity due  to excess calories I89.0 Lymphedema, not elsewhere classified Modifier: right lower s of muscle Quantity: 1 extremity Electronic Signature(s) Signed: 12/19/2015 12:02:19 PM By: Elliot Gurney, RN, BSN, Kim RN, BSN Signed: 12/19/2015 3:21:55 PM By: Evlyn Kanner MD, FACS Previous Signature: 12/19/2015 11:33:10 AM Version By: Evlyn Kanner MD, FACS Entered By:  Elliot Gurney, RN, BSN, Kim on 12/19/2015 11:51:26

## 2015-12-19 NOTE — Progress Notes (Signed)
TAMARI, BUSIC ([id number]) Visit Report for 12/19/2015 Arrival Information Details Patient Name: [name], [name] Date of Service: 12/19/2015 10:45 AM Medical Record Number: [id number] Patient Account Number: [id number] Date of Birth/Sex: 02-04-1936 (80 y.o. Male) Treating RN: Huel Coventry Primary Care Physician: Dorothey Baseman Other Clinician: Referring Physician: Dorothey Baseman Treating Physician/Extender: Rudene Re in Treatment: 48 Visit Information History Since Last Visit Added or deleted any medications: No Patient Arrived: Wheel Chair Any new allergies or adverse reactions: No Arrival Time: 10:46 Had a fall or experienced change in No activities of daily living that may affect Accompanied By: self risk of falls: Transfer Assistance: None Hospitalized since last visit: No Patient Identification Verified: Yes Has Dressing in Place as Prescribed: Yes Secondary Verification Process Yes Has Compression in Place as Prescribed: Yes Completed: Pain Present Now: No Patient Requires Transmission-Based No Precautions: Patient Has Alerts: No Electronic Signature(s) Signed: 12/19/2015 11:40:57 AM By: Elliot Gurney, RN, BSN, Kim RN, BSN Entered By: Elliot Gurney, RN, BSN, Kim on 12/19/2015 10:46:55 Laderrick, Wilk Chrissie Noa ([id number]) -------------------------------------------------------------------------------- Clinic Level of Care Assessment Details Patient Name: [name] Date of Service: 12/19/2015 10:45 AM Medical Record Number: [id number] Patient Account Number: [id number] Date of Birth/Sex: [birth year] (80 y.o. Male) Treating RN: Huel Coventry Primary Care Physician: Terance Hart, DAVID Other Clinician: Referring Physician: Terance Hart, DAVID Treating Physician/Extender: Rudene Re in Treatment: 48 Clinic Level of Care Assessment Items TOOL 4 Quantity Score []  - Use when only an EandM is performed on FOLLOW-UP visit 0 ASSESSMENTS - Nursing Assessment /  Reassessment []  - Reassessment of Co-morbidities (includes updates in patient status) 0 X - Reassessment of Adherence to Treatment Plan 1 5 ASSESSMENTS - Wound and Skin Assessment / Reassessment X - Simple Wound Assessment / Reassessment - one wound 1 5 []  - Complex Wound Assessment / Reassessment - multiple wounds 0 []  - Dermatologic / Skin Assessment (not related to wound area) 0 ASSESSMENTS - Focused Assessment []  - Circumferential Edema Measurements - multi extremities 0 []  - Nutritional Assessment / Counseling / Intervention 0 []  - Lower Extremity Assessment (monofilament, tuning fork, pulses) 0 []  - Peripheral Arterial Disease Assessment (using hand held doppler) 0 ASSESSMENTS - Ostomy and/or Continence Assessment and Care []  - Incontinence Assessment and Management 0 []  - Ostomy Care Assessment and Management (repouching, etc.) 0 PROCESS - Coordination of Care X - Simple Patient / Family Education for ongoing care 1 15 []  - Complex (extensive) Patient / Family Education for ongoing care 0 X - Staff obtains Chiropractor, Records, Test Results / Process Orders 1 10 []  - Staff telephones HHA, Nursing Homes / Clarify orders / etc 0 []  - Routine Transfer to another Facility (non-emergent condition) 0 REID, REGAS ([id number]) []  - Routine Hospital Admission (non-emergent condition) 0 []  - New Admissions / Manufacturing engineer / Ordering NPWT, Apligraf, etc. 0 []  - Emergency Hospital Admission (emergent condition) 0 X - Simple Discharge Coordination 1 10 []  - Complex (extensive) Discharge Coordination 0 PROCESS - Special Needs []  - Pediatric / Minor Patient Management 0 []  - Isolation Patient Management 0 []  - Hearing / Language / Visual special needs 0 []  - Assessment of Community assistance (transportation, D/C planning, etc.) 0 []  - Additional assistance / Altered mentation 0 []  - Support Surface(s) Assessment (bed, cushion, seat, etc.) 0 INTERVENTIONS - Wound Cleansing /  Measurement []  - Simple Wound Cleansing - one wound 0 []  - Complex Wound Cleansing - multiple wounds 0 X - Wound Imaging (photographs - any number of wounds) 1  5  - Wound Tracing (instead of photographs) 0  - Simple Wound Measurement - one wound 0  - Complex Wound Measurement - multiple wounds 0 INTERVENTIONS - Wound Dressings  - Small Wound Dressing one or multiple wounds 0  - Medium Wound Dressing one or multiple wounds 0  - Large Wound Dressing one or multiple wounds 0  - Application of Medications - topical 0  - Application of Medications - injection 0 INTERVENTIONS - Miscellaneous  - External ear exam 0 RAVIS, HERNE ([id number])  - Specimen Collection (cultures, biopsies, blood, body fluids, etc.) 0  - Specimen(s) / Culture(s) sent or taken to Lab for analysis 0  - Patient Transfer (multiple staff / Michiel Sites Lift / Similar devices) 0  - Simple Staple / Suture removal (25 or less) 0  - Complex Staple / Suture removal (26 or more) 0  - Hypo / Hyperglycemic Management (close monitor of Blood Glucose) 0  - Ankle / Brachial Index (ABI) - do not check if billed separately 0 X - Vital Signs 1 5 Has the patient been seen at the hospital within the last three years: Yes Total Score: 55 Level Of Care: New/Established - Level 2 Electronic Signature(s) Signed: 12/19/2015 12:02:19 PM By: Elliot Gurney, RN, BSN, Kim RN, BSN Entered By: Elliot Gurney, RN, BSN, Kim on 12/19/2015 11:51:02 Gaelan, Glennon Chrissie Noa ([id number]) -------------------------------------------------------------------------------- Encounter Discharge Information Details Patient Name: [name] Date of Service: 12/19/2015 10:45 AM Medical Record Number: [id number] Patient Account Number: [id number] Date of Birth/Sex: 1935-10-22 (80 y.o. Male) Treating RN: Huel Coventry Primary Care Physician: Dorothey Baseman Other Clinician: Referring Physician: Dorothey Baseman Treating Physician/Extender: Rudene Re in Treatment: 83 Encounter Discharge Information Items Schedule Follow-up Appointment: No Medication Reconciliation completed No and provided to Patient/Care Felesia Stahlecker: Provided on Clinical Summary of Care: 12/19/2015 Form Type Recipient Paper Patient [facility] Electronic Signature(s) Signed: 12/19/2015 11:19:52 AM By: Gwenlyn Perking Entered By: Gwenlyn Perking on 12/19/2015 11:19:52 Michal, Callicott Chrissie Noa ([id number]) -------------------------------------------------------------------------------- Lower Extremity Assessment Details Patient Name: SAATHVIK, EVERY. Date of Service: 12/19/2015 10:45 AM Medical Record Number: [id number] Patient Account Number: [id number] Date of Birth/Sex: Mar 28, 1936 (80 y.o. Male) Treating RN: Huel Coventry Primary Care Physician: Terance Hart, DAVID Other Clinician: Referring Physician: Terance Hart, DAVID Treating Physician/Extender: Rudene Re in Treatment: 48 Edema Assessment Assessed: [Left: No] [Right: No] E[Left: dema] [Right: :] Calf Left: Right: Point of Measurement: 40 cm From Medial Instep cm 35.5 cm Ankle Left: Right: Point of Measurement: 12 cm From Medial Instep cm 24.3 cm Vascular Assessment Pulses: Posterior Tibial Dorsalis Pedis Palpable: [Right:Yes] Extremity colors, hair growth, and conditions: Extremity Color: [Right:Hyperpigmented] Hair Growth on Extremity: [Right:No] Temperature of Extremity: [Right:Warm] Capillary Refill: [Right:> 3 seconds] Toe Nail Assessment Left: Right: Thick: Yes Discolored: Yes Deformed: Yes Improper Length and Hygiene: Yes Electronic Signature(s) Signed: 12/19/2015 11:40:57 AM By: Elliot Gurney, RN, BSN, Kim RN, BSN Entered By: Elliot Gurney, RN, BSN, Kim on 12/19/2015 10:56:25 Delshawn, Stech Chrissie Noa ([id number]) -------------------------------------------------------------------------------- Multi Wound Chart Details Patient Name: [name]. Date of Service: 12/19/2015 10:45 AM Medical Record Number:  [id number] Patient Account Number: [id number] Date of Birth/Sex: 1935/08/17 (80 y.o. Male) Treating RN: Huel Coventry Primary Care Physician: Dorothey Baseman Other Clinician: Referring Physician: Terance Hart, DAVID Treating Physician/Extender: Rudene Re in Treatment: 48 Vital Signs Height(in): 68 Pulse(bpm): 64 Weight(lbs): Blood Pressure 130/61 (mmHg): Body Mass Index(BMI): Temperature(F): 97.6 Respiratory Rate 18 (breaths/min): Photos: [5:No Photos] [N/A:N/A] Wound Location: [5:Right Lower Leg - Medial] [N/A:N/A] Wounding Event: [5:Gradually Appeared] [N/A:N/A] Primary Etiology: [5:Venous Leg  Ulcer] [N/A:N/A] Comorbid History: [5:Cataracts, Anemia, Hypertension, Osteoarthritis] [N/A:N/A] Date Acquired: [5:11/13/2014] [N/A:N/A] Weeks of Treatment: [5:48] [N/A:N/A] Wound Status: [5:Healed - Epithelialized] [N/A:N/A] Measurements L x W x D 0x0x0 [N/A:N/A] (cm) Area (cm) : [5:0] [N/A:N/A] Volume (cm) : [5:0] [N/A:N/A] % Reduction in Area: [5:100.00%] [N/A:N/A] % Reduction in Volume: 100.00% [N/A:N/A] Classification: [5:Full Thickness Without Exposed Support Structures] [N/A:N/A] Granulation Amount: [5:Large (67-100%)] [N/A:N/A] Epithelialization: [5:Large (67-100%)] [N/A:N/A] Periwound Skin Texture: Scarring: Yes [N/A:N/A] Periwound Skin [5:Dry/Scaly: Yes] [N/A:N/A] Moisture: Periwound Skin Color: No Abnormalities Noted [N/A:N/A] Tenderness on [5:No] [N/A:N/A] Palpation: Wound Preparation: [5:Ulcer Cleansing: Rinsed/Irrigated with Saline] [N/A:N/A] Topical Anesthetic Applied: None Treatment Notes Electronic Signature(s) Signed: 12/19/2015 11:40:57 AM By: Elliot Gurney, RN, BSN, Kim RN, BSN Entered By: Elliot Gurney, RN, BSN, Kim on 12/19/2015 11:06:06 Thailan, Sava Chrissie Noa ([id number]) -------------------------------------------------------------------------------- Multi-Disciplinary Care Plan Details Patient Name: RENZO, VINCELETTE. Date of Service: 12/19/2015 10:45  AM Medical Record Number: [id number] Patient Account Number: [id number] Date of Birth/Sex: 1935-10-07 (80 y.o. Male) Treating RN: Huel Coventry Primary Care Physician: Dorothey Baseman Other Clinician: Referring Physician: Dorothey Baseman Treating Physician/Extender: Rudene Re in Treatment: 38 Active Inactive Electronic Signature(s) Signed: 12/19/2015 12:02:19 PM By: Elliot Gurney RN, BSN, Kim RN, BSN Previous Signature: 12/19/2015 11:40:57 AM Version By: Elliot Gurney RN, BSN, Kim RN, BSN Entered By: Elliot Gurney, RN, BSN, Kim on 12/19/2015 11:49:48 Miro, Balderson Chrissie Noa ([id number]) -------------------------------------------------------------------------------- Pain Assessment Details Patient Name: WENCESLAO, LOPER. Date of Service: 12/19/2015 10:45 AM Medical Record Number: [id number] Patient Account Number: [id number] Date of Birth/Sex: 09-08-1935 (80 y.o. Male) Treating RN: Huel Coventry Primary Care Physician: Dorothey Baseman Other Clinician: Referring Physician: Dorothey Baseman Treating Physician/Extender: Rudene Re in Treatment: 48 Active Problems Location of Pain Severity and Description of Pain Patient Has Paino No Site Locations With Dressing Change: No Pain Management and Medication Current Pain Management: Electronic Signature(s) Signed: 12/19/2015 11:40:57 AM By: Elliot Gurney, RN, BSN, Kim RN, BSN Entered By: Elliot Gurney, RN, BSN, Kim on 12/19/2015 10:47:19 Arius, Harnois Chrissie Noa ([id number]) -------------------------------------------------------------------------------- Wound Assessment Details Patient Name: BASSEM, BERNASCONI. Date of Service: 12/19/2015 10:45 AM Medical Record Number: [id number] Patient Account Number: [id number] Date of Birth/Sex: [birth year] (80 y.o. Male) Treating RN: Huel Coventry Primary Care Physician: Terance Hart, DAVID Other Clinician: Referring Physician: Terance Hart, DAVID Treating Physician/Extender: Rudene Re in Treatment: 48 Wound Status Wound Number:  5 Primary Venous Leg Ulcer Etiology: Wound Location: Right Lower Leg - Medial Wound Status: Healed - Epithelialized Wounding Event: Gradually Appeared Comorbid Cataracts, Anemia, Hypertension, Date Acquired: 11/13/2014 History: Osteoarthritis Weeks Of Treatment: 48 Clustered Wound: No Wound Measurements Length: (cm) 0 % Reductio Width: (cm) 0 % Reductio Depth: (cm) 0 Epithelial Area: (cm) 0 Volume: (cm) 0 n in Area: 100% n in Volume: 100% ization: Large (67-100%) Wound Description Full Thickness Without Exposed Classification: Support Structures Wound Bed Granulation Amount: Large (67-100%) Periwound Skin Texture Texture Color No Abnormalities Noted: No No Abnormalities Noted: No Scarring: Yes Moisture No Abnormalities Noted: No Dry / Scaly: Yes Wound Preparation Ulcer Cleansing: Rinsed/Irrigated with Saline Topical Anesthetic Applied: None Electronic Signature(s) Signed: 12/19/2015 11:40:57 AM By: Elliot Gurney, RN, BSN, Kim RN, BSN Entered By: Elliot Gurney, RN, BSN, Kim on 12/19/2015 11:05:07 DEVANTE, CAPANO ([id number]) ALYUS, MOFIELD ([id number]) -------------------------------------------------------------------------------- Vitals Details Patient Name: JAKSEN, FIORELLA. Date of Service: 12/19/2015 10:45 AM Medical Record Number: [id number] Patient Account Number: [id number] Date of Birth/Sex: 11/23/1935 (80 y.o. Male) Treating RN: Huel Coventry Primary Care Physician: Dorothey Baseman Other Clinician: Referring Physician: Dorothey Baseman Treating Physician/Extender: Rudene Re in Treatment: 48 Vital  Signs Time Taken: 10:47 Temperature (F): 97.6 Height (in): 68 Pulse (bpm): 64 Respiratory Rate (breaths/min): 18 Blood Pressure (mmHg): 130/61 Reference Range: 80 - 120 mg / dl Electronic Signature(s) Signed: 12/19/2015 11:40:57 AM By: Elliot Gurney, RN, BSN, Kim RN, BSN Entered By: Elliot Gurney, RN, BSN, Kim on 12/19/2015 10:47:39

## 2016-04-26 ENCOUNTER — Emergency Department
Admission: EM | Admit: 2016-04-26 | Discharge: 2016-04-27 | Disposition: A | Discharge status: Home / Self Care | Payer: Medicare Other | Attending: Student in an Organized Health Care Education/Training Program | Admitting: Student in an Organized Health Care Education/Training Program

## 2016-04-26 ENCOUNTER — Emergency Department: Payer: Medicare Other

## 2016-04-26 ENCOUNTER — Encounter: Payer: Self-pay | Admitting: Emergency Medicine

## 2016-04-26 DIAGNOSIS — F4489 Other dissociative and conversion disorders: Secondary | ICD-10-CM

## 2016-04-26 DIAGNOSIS — G20 Parkinson's disease: Secondary | ICD-10-CM | POA: Diagnosis not present

## 2016-04-26 DIAGNOSIS — J690 Pneumonitis due to inhalation of food and vomit: Secondary | ICD-10-CM | POA: Diagnosis not present

## 2016-04-26 DIAGNOSIS — R4182 Altered mental status, unspecified: Secondary | ICD-10-CM | POA: Diagnosis present

## 2016-04-26 DIAGNOSIS — J189 Pneumonia, unspecified organism: Secondary | ICD-10-CM

## 2016-04-26 HISTORY — DX: Parkinson's disease without dyskinesia, without mention of fluctuations: G20.A1

## 2016-04-26 HISTORY — DX: Parkinson's disease: G20

## 2016-04-26 LAB — CBC WITH DIFFERENTIAL/PLATELET
BASOS PCT: 0 %
Basophils Absolute: 0 10*3/uL (ref 0–0.1)
EOS ABS: 0.1 10*3/uL (ref 0–0.7)
Eosinophils Relative: 1 %
HEMATOCRIT: 39.2 % — AB (ref 40.0–52.0)
Hemoglobin: 13 g/dL (ref 13.0–18.0)
LYMPHS ABS: 1.5 10*3/uL (ref 1.0–3.6)
Lymphocytes Relative: 13 %
MCH: 27.3 pg (ref 26.0–34.0)
MCHC: 33.2 g/dL (ref 32.0–36.0)
MCV: 82.3 fL (ref 80.0–100.0)
MONO ABS: 1.2 10*3/uL — AB (ref 0.2–1.0)
MONOS PCT: 10 %
NEUTROS ABS: 8.8 10*3/uL — AB (ref 1.4–6.5)
Neutrophils Relative %: 76 %
Platelets: 132 10*3/uL — ABNORMAL LOW (ref 150–440)
RBC: 4.76 MIL/uL (ref 4.40–5.90)
RDW: 14 % (ref 11.5–14.5)
WBC: 11.6 10*3/uL — ABNORMAL HIGH (ref 3.8–10.6)

## 2016-04-26 LAB — URINALYSIS COMPLETE WITH MICROSCOPIC (ARMC ONLY)
GLUCOSE, UA: NEGATIVE mg/dL
NITRITE: NEGATIVE
Protein, ur: 30 mg/dL — AB
SPECIFIC GRAVITY, URINE: 1.026 (ref 1.005–1.030)
pH: 5 (ref 5.0–8.0)

## 2016-04-26 LAB — COMPREHENSIVE METABOLIC PANEL
ALBUMIN: 3.1 g/dL — AB (ref 3.5–5.0)
ALT: <5 U/L — ABNORMAL LOW (ref 17–63)
ANION GAP: 5 (ref 5–15)
AST: 8 U/L — ABNORMAL LOW (ref 15–41)
Alkaline Phosphatase: 77 U/L (ref 38–126)
BILIRUBIN TOTAL: 0.5 mg/dL (ref 0.3–1.2)
BUN: 26 mg/dL — ABNORMAL HIGH (ref 6–20)
CALCIUM: 8.7 mg/dL — AB (ref 8.9–10.3)
CO2: 32 mmol/L (ref 22–32)
Chloride: 101 mmol/L (ref 101–111)
Creatinine, Ser: 0.76 mg/dL (ref 0.61–1.24)
GFR calc non Af Amer: >60 mL/min (ref >60)
GLUCOSE: 104 mg/dL — AB (ref 65–99)
POTASSIUM: 3.8 mmol/L (ref 3.5–5.1)
Sodium: 138 mmol/L (ref 135–145)
TOTAL PROTEIN: 6.4 g/dL — AB (ref 6.5–8.1)

## 2016-04-26 MED ORDER — AMOXICILLIN-POT CLAVULANATE 875-125 MG PO TABS: 1.0000 | ORAL_TABLET | Freq: Two times a day (BID) | ORAL | 0 refills | Status: AC

## 2016-04-26 MED ORDER — SODIUM CHLORIDE 0.9 % IV BOLUS (SEPSIS)
500.0000 mL | Freq: Once | INTRAVENOUS | Status: AC
  Administered 2016-04-26: 500 mL via INTRAVENOUS

## 2016-04-26 MED ORDER — SODIUM CHLORIDE 0.9 % IV SOLN
1.5000 g | Freq: Once | INTRAVENOUS | Status: AC
  Administered 2016-04-26: 1.5 g via INTRAVENOUS
  Filled 2016-04-26 (×2): qty 1.5

## 2016-04-26 NOTE — ED Notes (Signed)
Called pharmacy to send up dose of Unasyn that was due at 2300.

## 2016-04-26 NOTE — ED Notes (Signed)
Attempted in and out cath with Wende Neighbors but due to anatomy were unsuccessful. Placed a U bag on pt.

## 2016-04-26 NOTE — ED Provider Notes (Signed)
[facility] Emergency Department Provider Note    First MD Initiated Contact with Patient 04/26/16 2013     (approximate)  I have reviewed the triage vital signs and the nursing notes.   HISTORY  Chief Complaint Failure To Thrive  Level V Caveat:  Dementia, end stage parkinsons, poor historian  HPI [name] is a 80 y.o. male history of Parkinson's living at [facility] health care who is a DO NOT RESUSCITATE presents at family request due to altered mental status. States that he's been been more confused than normal. Family is also concerned for aspiration and possible pneumonia as had thick congestion. No previous history of stroke. States that they also found him today at the nursing facility and had a fentanyl patch on and he is very confused. They're not certain as to why he has a fentanyl patch scheduled.   Past Medical History:  Diagnosis Date  . Parkinson disease [facility])     Patient Active Problem List   Diagnosis Date Noted  . Idiopathic chronic venous hypertension of both lower extremities with ulcer (HCC) 07/04/2015    History reviewed. No pertinent surgical history.  Prior to Admission medications   Medication Sig Start Date End Date Taking? Authorizing Provider  amoxicillin-clavulanate (AUGMENTIN) 875-125 MG tablet Take 1 tablet by mouth 2 (two) times daily. 04/26/16 05/03/16  Willy Eddy, MD    Allergies Review of patient's allergies indicates not on file.  No family history on file.  Social History Social History  Substance Use Topics  . Smoking status: Not on file  . Smokeless tobacco: Not on file  . Alcohol use Not on file    Review of Systems Patient denies headaches, rhinorrhea, blurry vision, numbness, shortness of breath, chest pain, edema, cough, abdominal pain, nausea, vomiting, diarrhea, dysuria, fevers, rashes or hallucinations unless otherwise stated above in  HPI. ____________________________________________   PHYSICAL EXAM:  VITAL SIGNS: Vitals:   04/26/16 2100 04/26/16 2330  BP: 118/63 (!) 131/56  Pulse: 69 73  Resp: 15 16  Temp:      Constitutional:elderly ill appearing male, drowsy and confused Eyes: Conjunctivae are normal. PERRL. EOMI. Head: Atraumatic. Nose: No congestion/rhinnorhea. Mouth/Throat: Mucous membranes are dry.  Oropharynx non-erythematous. Neck: No stridor. Painless ROM. No cervical spine tenderness to palpation Hematological/Lymphatic/Immunilogical: No cervical lymphadenopathy. Cardiovascular: Normal rate, regular rhythm. Grossly normal heart sounds.  Good peripheral circulation. Respiratory: Normal respiratory effort.  No retractions. Lungs with coarse respiratory rhonchi in rll Gastrointestinal: Soft and nontender. No distention. No abdominal bruits. No CVA tenderness.  Musculoskeletal: No lower extremity tenderness nor edema.  No joint effusions. Neurologic:  Drowsy, encephalopathic, no facial droop Skin:  Skin is warm, dry and intact. No rash noted.   ____________________________________________   LABS (all labs ordered are listed, but only abnormal results are displayed)  Results for orders placed or performed during the hospital encounter of 04/26/16 (from the past 24 hour(s))  CBC with Differential/Platelet     Status: Abnormal   Collection Time: 04/26/16  8:06 PM  Result Value Ref Range   WBC 11.6 (H) 3.8 - 10.6 K/uL   RBC 4.76 4.40 - 5.90 MIL/uL   Hemoglobin 13.0 13.0 - 18.0 g/dL   HCT 16.1 (L) 09.6 - 04.5 %   MCV 82.3 80.0 - 100.0 fL   MCH 27.3 26.0 - 34.0 pg   MCHC 33.2 32.0 - 36.0 g/dL   RDW 40.9 81.1 - 91.4 %   Platelets 132 (L) 150 - 440 K/uL  Neutrophils Relative % 76 %   Neutro Abs 8.8 (H) 1.4 - 6.5 K/uL   Lymphocytes Relative 13 %   Lymphs Abs 1.5 1.0 - 3.6 K/uL   Monocytes Relative 10 %   Monocytes Absolute 1.2 (H) 0.2 - 1.0 K/uL   Eosinophils Relative 1 %   Eosinophils  Absolute 0.1 0 - 0.7 K/uL   Basophils Relative 0 %   Basophils Absolute 0.0 0 - 0.1 K/uL  Comprehensive metabolic panel     Status: Abnormal   Collection Time: 04/26/16  8:06 PM  Result Value Ref Range   Sodium 138 135 - 145 mmol/L   Potassium 3.8 3.5 - 5.1 mmol/L   Chloride 101 101 - 111 mmol/L   CO2 32 22 - 32 mmol/L   Glucose, Bld 104 (H) 65 - 99 mg/dL   BUN 26 (H) 6 - 20 mg/dL   Creatinine, Ser 1.61 0.61 - 1.24 mg/dL   Calcium 8.7 (L) 8.9 - 10.3 mg/dL   Total Protein 6.4 (L) 6.5 - 8.1 g/dL   Albumin 3.1 (L) 3.5 - 5.0 g/dL   AST 8 (L) 15 - 41 U/L   ALT <5 (L) 17 - 63 U/L   Alkaline Phosphatase 77 38 - 126 U/L   Total Bilirubin 0.5 0.3 - 1.2 mg/dL   GFR calc non Af Amer >60 >60 mL/min   GFR calc Af Amer >60 >60 mL/min   Anion gap 5 5 - 15  Urinalysis complete, with microscopic ([facility] only)     Status: Abnormal   Collection Time: 04/26/16  9:10 PM  Result Value Ref Range   Color, Urine AMBER (A) YELLOW   APPearance CLOUDY (A) CLEAR   Glucose, UA NEGATIVE NEGATIVE mg/dL   Bilirubin Urine 1+ (A) NEGATIVE   Ketones, ur TRACE (A) NEGATIVE mg/dL   Specific Gravity, Urine 1.026 1.005 - 1.030   Hgb urine dipstick 1+ (A) NEGATIVE   pH 5.0 5.0 - 8.0   Protein, ur 30 (A) NEGATIVE mg/dL   Nitrite NEGATIVE NEGATIVE   Leukocytes, UA 2+ (A) NEGATIVE   RBC / HPF 0-5 0 - 5 RBC/hpf   WBC, UA 6-30 0 - 5 WBC/hpf   Bacteria, UA RARE (A) NONE SEEN   Squamous Epithelial / LPF 6-30 (A) NONE SEEN   Mucous PRESENT    Hyaline Casts, UA PRESENT    ____________________________________________  EKG My review and personal interpretation at Time: 19:55   Indication: ams  Rate: 70  Rhythm: sinus Axis: normal Other: no acute ischemia ____________________________________________  RADIOLOGY  I personally reviewed all radiographic images ordered to evaluate for the above acute complaints and reviewed radiology reports and findings.  These findings were personally discussed with the patient.  Please  see medical record for radiology report. ____________________________________________   PROCEDURES  Procedure(s) performed: none    Critical Care performed: no ____________________________________________   INITIAL IMPRESSION / ASSESSMENT AND PLAN / ED COURSE  Pertinent labs & imaging results that were available during my care of the patient were reviewed by me and considered in my medical decision making (see chart for details).  DDX: sepsis, overdose, cva, drug reaction  [name] is a 80 y.o. who presents to the ED with altered mental status. Patient does appear to have multiple factors going on at this time. Will order CT imaging to evaluate for any evidence of acute ischemia. Does appear to have clinical evidence of probable aspiration pneumonia. Patient is otherwise well-perfused in not overtly septic. Does  not have any new hypoxia. After discussion with family do suspect that the patient is being overmedicated and I'm not certain why he needs a fentanyl patch. This was removed and will observe for any clearance of his mental status. Do not feel that Narcan is clinically indicated at this time as he is normal respiratory rate and hypoxia. The patient will be placed on continuous pulse oximetry and telemetry for monitoring.  Laboratory evaluation will be sent to evaluate for the above complaints.     Clinical Course  Comment By Time  Urine is without any evidence of UTI. Patient does have mild leukocytosis but no significant acidosis. Blood work is otherwise reassuring. He remains hemodynamically stable with no hypoxia on room air. CT imaging is assuring. His confusion is improving after removing his fentanyl patch. Currently now alert and oriented and interacting with family and staff. Based my findings I did recommend admission to hospital but after discussion with family they prefer to be taken back to facility. Patient will be given IV dose of Unasyn for probable aspiration  pneumonia. I called the nurse practitioner at his facility and discussed findings and she agrees to have some evaluate him immediately upon return to the facility. They will have speech, evaluated the patient for formal swallow study in the morning. We have also discussed discontinuing any fentanyl.  Have discussed with the patient and available family all diagnostics and treatments performed thus far and all questions were answered to the best of my ability. The patient demonstrates understanding and agreement with plan.  Willy EddyPatrick Lexii Walsh, MD 10/23 0011     ____________________________________________   FINAL CLINICAL IMPRESSION(S) / ED DIAGNOSES  Final diagnoses:  Confusion state  Parkinson's disease (HCC)  Aspiration pneumonia of right lung, unspecified aspiration pneumonia type, unspecified part of lung (HCC)      NEW MEDICATIONS STARTED DURING THIS VISIT:  New Prescriptions   AMOXICILLIN-CLAVULANATE (AUGMENTIN) 875-125 MG TABLET    Take 1 tablet by mouth 2 (two) times daily.     Note:  This document was prepared using Dragon voice recognition software and may include unintentional dictation errors.    Willy EddyPatrick Ty Buntrock, MD 04/27/16 Moses Manners0025

## 2016-04-26 NOTE — ED Triage Notes (Signed)
Pt arrived via ems from [facility]. Nurse practitioner contacted charge nurse and reported pt was end stage parkinson disease. NP also reports a recent removal of pt's fentanyl patch. EMS reports that family complains of altered mental status and a decline in condition over the last two days resulting in a more lethargic state and decreased appetite. NP's number [phone]

## 2016-04-27 NOTE — ED Notes (Signed)
EMS arrived to take patient back to his facility.

## 2016-04-27 NOTE — ED Notes (Addendum)
Medical necessity form printed and given to [employer] operational officersecretary.

## 2016-05-07 DIAGNOSIS — Z66 Do not resuscitate: Secondary | ICD-10-CM | POA: Diagnosis not present

## 2016-05-07 DIAGNOSIS — R1310 Dysphagia, unspecified: Secondary | ICD-10-CM | POA: Diagnosis not present

## 2016-05-07 DIAGNOSIS — R627 Adult failure to thrive: Secondary | ICD-10-CM | POA: Diagnosis not present

## 2016-05-07 DIAGNOSIS — Z515 Encounter for palliative care: Secondary | ICD-10-CM | POA: Diagnosis not present

## 2016-05-07 DIAGNOSIS — I5032 Chronic diastolic (congestive) heart failure: Secondary | ICD-10-CM | POA: Diagnosis not present

## 2016-05-07 DIAGNOSIS — G20 Parkinson's disease: Secondary | ICD-10-CM | POA: Diagnosis not present

## 2016-08-06 DEATH — deceased

## 2017-02-20 IMAGING — CT CT HEAD W/O CM
3 of 4 series · 14 of 47 positions shown, 16 images · non-contrast
Comparison: Head CT 07/18/2014 and MRI 07/19/2014

CLINICAL DATA: Altered mental status.

EXAM:
CT HEAD WITHOUT CONTRAST
TECHNIQUE: Contiguous axial images were obtained from the base of the skull
through the vertex without intravenous contrast.

[Series 2: head wo · axial · 0.47mm/px · z∈[-147,-17]mm · 8 of 32 slices shown, 10 images]
[im 3/32  brain]
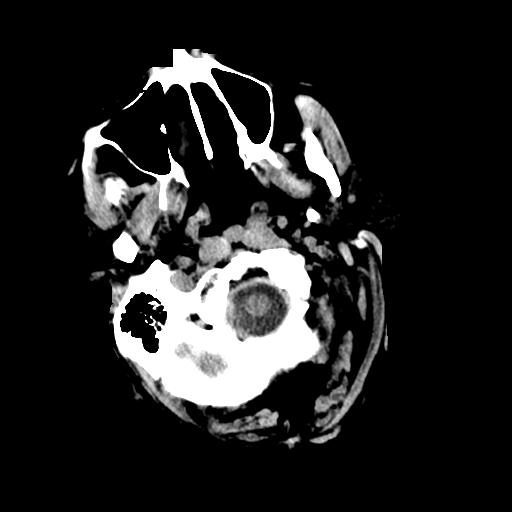
[im 3/32  bone]
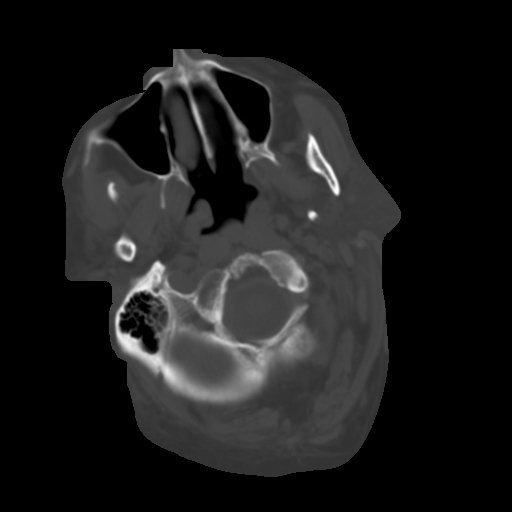
[im 7/32  brain]
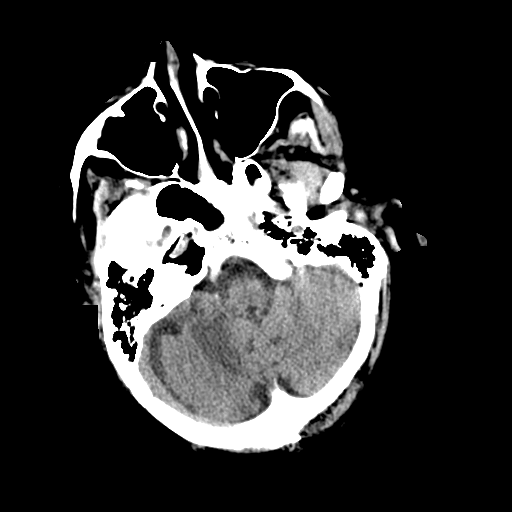
[im 11/32  brain]
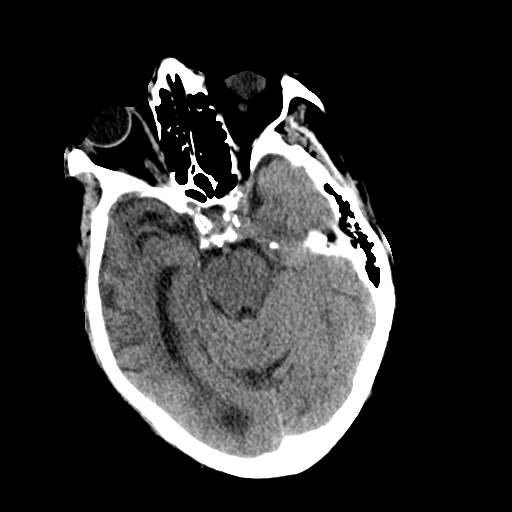
[im 15/32  brain]
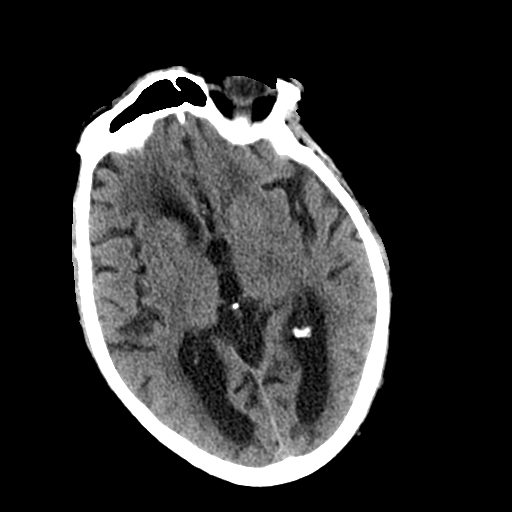
[im 17/32  brain]
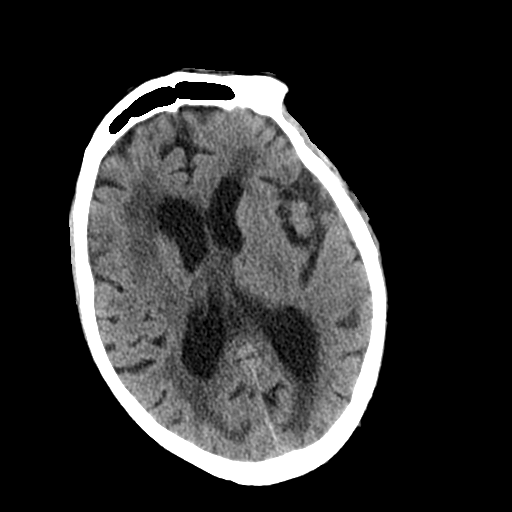
[im 17/32  bone]
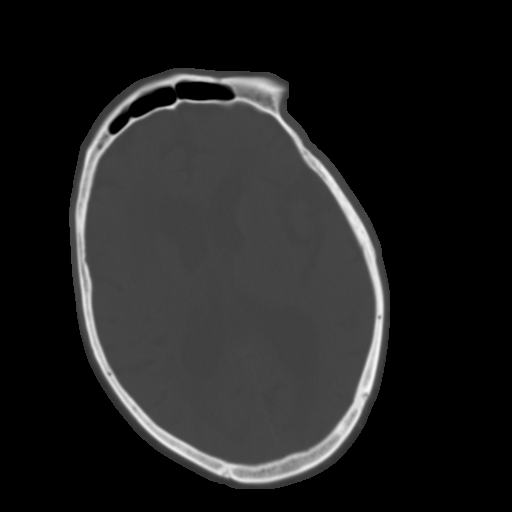
[im 21/32  brain]
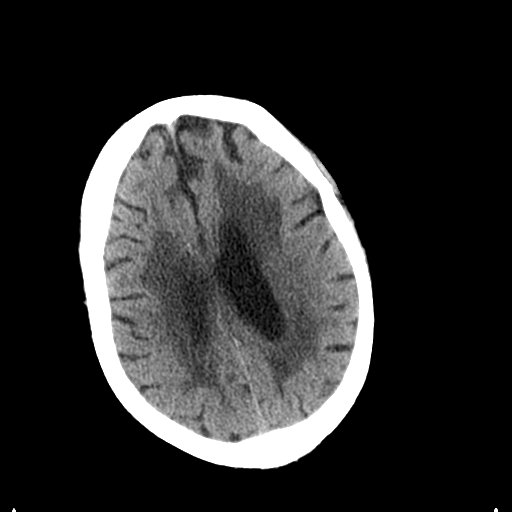
[im 25/32  brain]
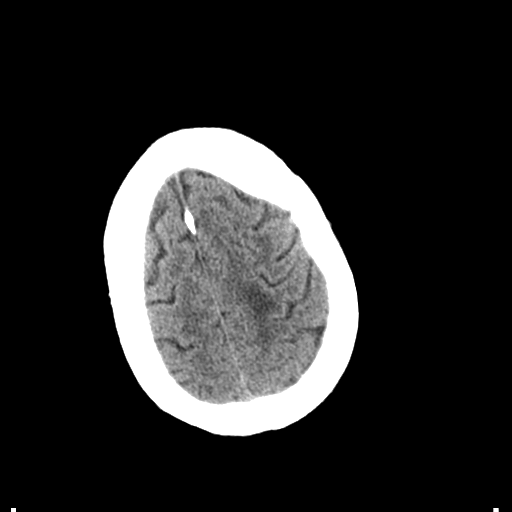
[im 29/32  brain]
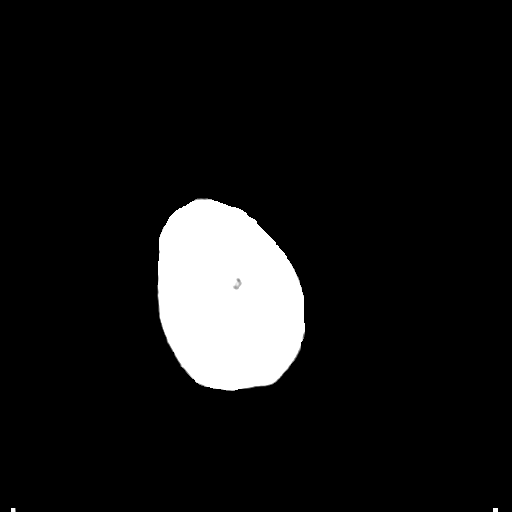

[Series 6: coronal soft tissue · coronal · 0.29mm/px · 3 of 62 slices shown]
[im 21/62  brain]
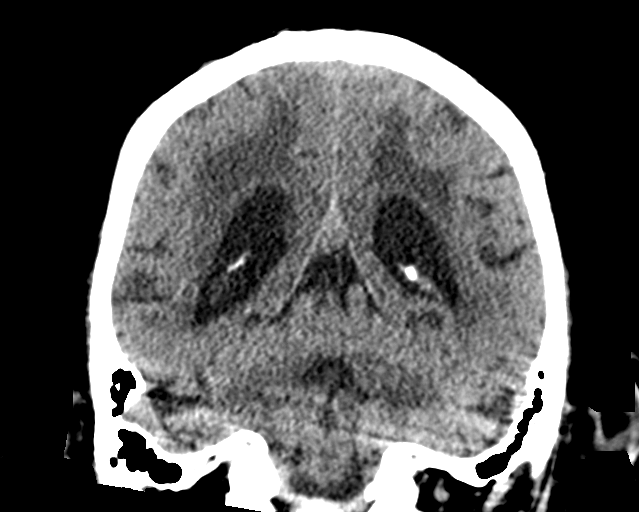
[im 28/62  brain]
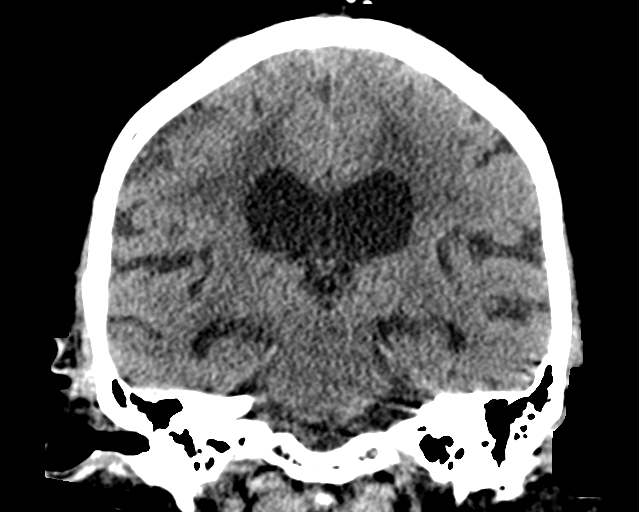
[im 34/62  brain]
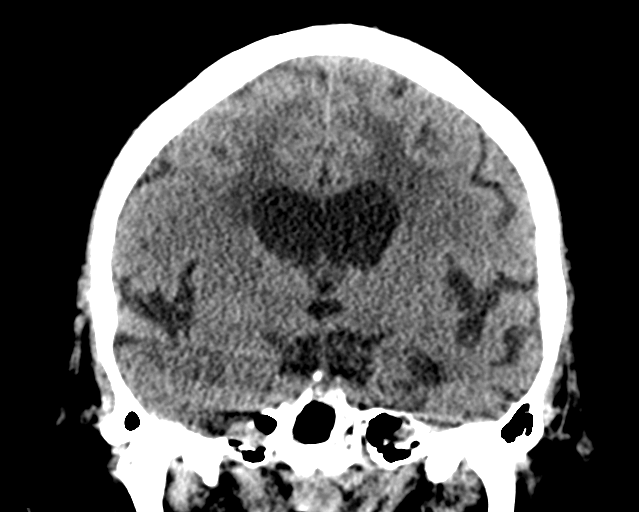

[Series 7: sagittal soft tissue · sagittal · 0.31mm/px · 3 of 50 slices shown]
[im 21/50  brain]
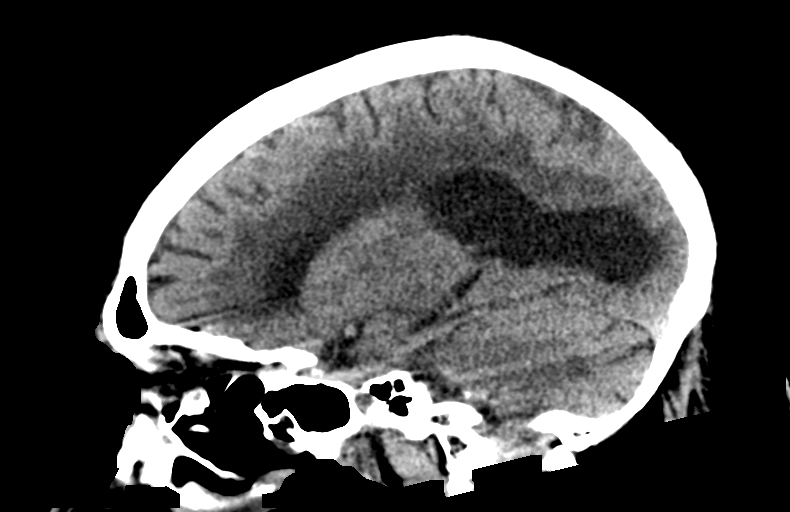
[im 25/50  brain]
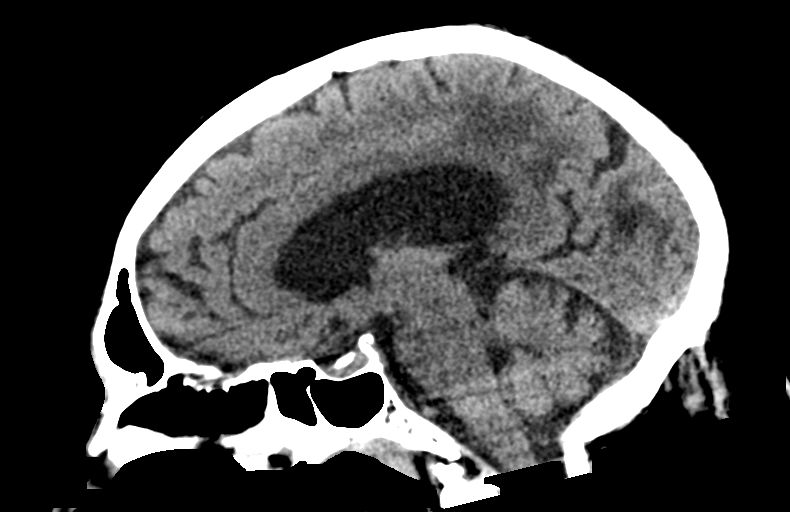
[im 29/50  brain]
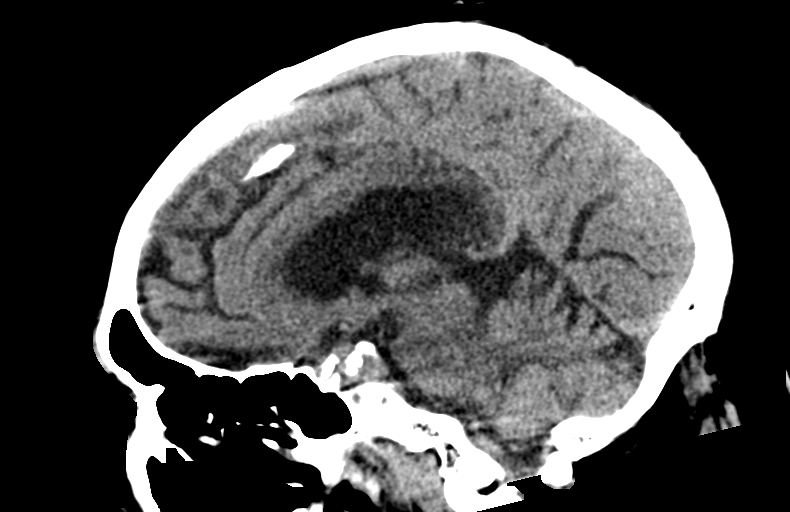

[14 of 47 positions shown; findings below may reference images not displayed]

FINDINGS: Brain: There is no evidence of acute cortical infarct, intracranial
hemorrhage, mass, midline shift, or extra-axial fluid collection.
Cerebral atrophy is unchanged. Confluent hypodensities in the
cerebral white matter are unchanged and nonspecific but compatible
with advanced chronic small vessel ischemic disease.

Vascular: Calcified atherosclerosis at the skullbase.

Skull: No fracture or focal osseous lesion.

Sinuses/Orbits: Prior bilateral cataract extraction. The visualized
paranasal sinuses and mastoid air cells are clear.

Other: None.
IMPRESSION: 1. No evidence of acute intracranial abnormality.
2. Advanced chronic small vessel ischemic disease.
